# Patient Record
Sex: Male | Born: 1945 | Race: White | Hispanic: No | Marital: Single | Smoking: Former smoker
Health system: Southern US, Community
[De-identification: ages and names within clinical notes are randomized; demographics above are authoritative.]

## PROBLEM LIST (undated history)

## (undated) DIAGNOSIS — I10 Essential (primary) hypertension: Secondary | ICD-10-CM

## (undated) DIAGNOSIS — J8410 Pulmonary fibrosis, unspecified: Secondary | ICD-10-CM

## (undated) DIAGNOSIS — M069 Rheumatoid arthritis, unspecified: Secondary | ICD-10-CM

## (undated) DIAGNOSIS — I509 Heart failure, unspecified: Secondary | ICD-10-CM

## (undated) DIAGNOSIS — I4892 Unspecified atrial flutter: Secondary | ICD-10-CM

## (undated) DIAGNOSIS — J9610 Chronic respiratory failure, unspecified whether with hypoxia or hypercapnia: Secondary | ICD-10-CM

## (undated) HISTORY — DX: Rheumatoid arthritis, unspecified: M06.9

---

## 1966-10-19 HISTORY — PX: ACNE CYST REMOVAL: SUR1112

## 1971-10-20 HISTORY — PX: VASECTOMY: SHX75

## 1999-06-04 ENCOUNTER — Encounter (INDEPENDENT_AMBULATORY_CARE_PROVIDER_SITE_OTHER): Payer: Self-pay | Admitting: Specialist

## 1999-06-04 ENCOUNTER — Ambulatory Visit (HOSPITAL_COMMUNITY): Admission: RE | Admit: 1999-06-04 | Discharge: 1999-06-04 | Payer: Self-pay | Admitting: Gastroenterology

## 2010-06-04 ENCOUNTER — Encounter: Admission: RE | Admit: 2010-06-04 | Discharge: 2010-06-04 | Payer: Self-pay | Admitting: Family Medicine

## 2013-09-18 ENCOUNTER — Ambulatory Visit
Admission: RE | Admit: 2013-09-18 | Discharge: 2013-09-18 | Disposition: A | Discharge status: Home / Self Care | Payer: Medicare Other | Source: Ambulatory Visit | Attending: Family Medicine | Admitting: Family Medicine

## 2013-09-18 ENCOUNTER — Other Ambulatory Visit: Payer: Self-pay | Admitting: Family Medicine

## 2013-09-18 DIAGNOSIS — R0602 Shortness of breath: Secondary | ICD-10-CM

## 2013-09-22 ENCOUNTER — Other Ambulatory Visit (HOSPITAL_COMMUNITY): Payer: Self-pay

## 2013-09-22 DIAGNOSIS — R0602 Shortness of breath: Secondary | ICD-10-CM

## 2013-09-22 DIAGNOSIS — R05 Cough: Secondary | ICD-10-CM

## 2013-09-22 LAB — PULMONARY FUNCTION TEST
DL/VA % pred: 63 %
DLCO cor % pred: 38 %
DLCO unc % pred: 38 %
DLCO unc: 9.9 ml/min/mmHg
FEF 25-75 Post: 3.06 L/sec
FEF2575-%Pred-Post: 148 %
FEF2575-%Pred-Pre: 159 %
FEV1-%Pred-Pre: 68 %
FEV1-Post: 1.83 L
FEV1-Pre: 1.83 L
FEV1FVC-%Change-Post: 9 %
FEV6-%Change-Post: -9 %
FEV6-%Pred-Post: 53 %
FEV6-%Pred-Pre: 59 %
FEV6FVC-%Pred-Pre: 106 %
FVC-%Pred-Pre: 55 %
FVC-Post: 1.85 L
FVC-Pre: 2.03 L
Post FEV1/FVC ratio: 99 %
RV: 1.44 L
TLC: 3.61 L

## 2013-09-27 ENCOUNTER — Ambulatory Visit (HOSPITAL_COMMUNITY)
Admission: RE | Admit: 2013-09-27 | Discharge: 2013-09-27 | Disposition: A | Discharge status: Home / Self Care | Payer: Medicare Other | Source: Ambulatory Visit | Attending: Family Medicine | Admitting: Family Medicine

## 2013-09-27 DIAGNOSIS — R059 Cough, unspecified: Secondary | ICD-10-CM | POA: Insufficient documentation

## 2013-09-27 DIAGNOSIS — R05 Cough: Secondary | ICD-10-CM | POA: Insufficient documentation

## 2013-09-27 DIAGNOSIS — R0602 Shortness of breath: Secondary | ICD-10-CM | POA: Insufficient documentation

## 2013-09-27 MED ORDER — ALBUTEROL SULFATE (5 MG/ML) 0.5% IN NEBU
2.5000 mg | INHALATION_SOLUTION | Freq: Once | RESPIRATORY_TRACT | Status: AC
  Administered 2013-09-27: 2.5 mg via RESPIRATORY_TRACT

## 2013-10-31 ENCOUNTER — Encounter: Payer: Self-pay | Admitting: Internal Medicine

## 2013-11-01 ENCOUNTER — Institutional Professional Consult (permissible substitution): Payer: Medicare Other | Admitting: Internal Medicine

## 2013-11-21 ENCOUNTER — Telehealth: Payer: Self-pay | Admitting: Internal Medicine

## 2013-11-21 NOTE — Telephone Encounter (Signed)
Spoke with spouse. appt scheduled for wed w/ MW. Nothing further needed

## 2013-11-23 ENCOUNTER — Ambulatory Visit (INDEPENDENT_AMBULATORY_CARE_PROVIDER_SITE_OTHER): Payer: Medicare Other | Admitting: Internal Medicine

## 2013-11-23 ENCOUNTER — Encounter (INDEPENDENT_AMBULATORY_CARE_PROVIDER_SITE_OTHER): Payer: Self-pay

## 2013-11-23 ENCOUNTER — Ambulatory Visit (INDEPENDENT_AMBULATORY_CARE_PROVIDER_SITE_OTHER)
Admission: RE | Admit: 2013-11-23 | Discharge: 2013-11-23 | Disposition: A | Discharge status: Home / Self Care | Payer: Medicare Other | Source: Ambulatory Visit | Attending: Internal Medicine | Admitting: Internal Medicine

## 2013-11-23 ENCOUNTER — Encounter: Payer: Self-pay | Admitting: Internal Medicine

## 2013-11-23 ENCOUNTER — Other Ambulatory Visit (INDEPENDENT_AMBULATORY_CARE_PROVIDER_SITE_OTHER): Payer: Medicare Other

## 2013-11-23 VITALS — BP 140/80 | HR 88 | Temp 97.6°F | Ht 65.0 in | Wt 158.2 lb

## 2013-11-23 DIAGNOSIS — R0609 Other forms of dyspnea: Secondary | ICD-10-CM

## 2013-11-23 DIAGNOSIS — J8410 Pulmonary fibrosis, unspecified: Secondary | ICD-10-CM

## 2013-11-23 DIAGNOSIS — J9610 Chronic respiratory failure, unspecified whether with hypoxia or hypercapnia: Secondary | ICD-10-CM

## 2013-11-23 DIAGNOSIS — J9611 Chronic respiratory failure with hypoxia: Secondary | ICD-10-CM | POA: Insufficient documentation

## 2013-11-23 DIAGNOSIS — R0600 Dyspnea, unspecified: Secondary | ICD-10-CM | POA: Insufficient documentation

## 2013-11-23 DIAGNOSIS — R0989 Other specified symptoms and signs involving the circulatory and respiratory systems: Secondary | ICD-10-CM

## 2013-11-23 LAB — CBC WITH DIFFERENTIAL/PLATELET
BASOS PCT: 0.8 % (ref 0.0–3.0)
Basophils Absolute: 0.1 10*3/uL (ref 0.0–0.1)
EOS PCT: 2.6 % (ref 0.0–5.0)
Eosinophils Absolute: 0.4 10*3/uL (ref 0.0–0.7)
HEMATOCRIT: 43.2 % (ref 39.0–52.0)
Hemoglobin: 14.1 g/dL (ref 13.0–17.0)
LYMPHS ABS: 1.3 10*3/uL (ref 0.7–4.0)
LYMPHS PCT: 9.3 % — AB (ref 12.0–46.0)
MCHC: 32.8 g/dL (ref 30.0–36.0)
MCV: 87.2 fl (ref 78.0–100.0)
MONOS PCT: 6 % (ref 3.0–12.0)
Monocytes Absolute: 0.8 10*3/uL (ref 0.1–1.0)
NEUTROS ABS: 11.4 10*3/uL — AB (ref 1.4–7.7)
NEUTROS PCT: 81.3 % — AB (ref 43.0–77.0)
Platelets: 522 10*3/uL — ABNORMAL HIGH (ref 150.0–400.0)
RBC: 4.95 Mil/uL (ref 4.22–5.81)
RDW: 14.4 % (ref 11.5–14.6)
WBC: 14 10*3/uL — AB (ref 4.5–10.5)

## 2013-11-23 LAB — BASIC METABOLIC PANEL
BUN: 15 mg/dL (ref 6–23)
CHLORIDE: 104 meq/L (ref 96–112)
CO2: 23 meq/L (ref 19–32)
CREATININE: 1.1 mg/dL (ref 0.4–1.5)
Calcium: 10.5 mg/dL (ref 8.4–10.5)
GFR: 67.61 mL/min (ref >60.00)
Glucose, Bld: 109 mg/dL — ABNORMAL HIGH (ref 70–99)
Potassium: 4.2 mEq/L (ref 3.5–5.1)
Sodium: 136 mEq/L (ref 135–145)

## 2013-11-23 LAB — BRAIN NATRIURETIC PEPTIDE: PRO B NATRI PEPTIDE: 220 pg/mL — AB (ref 0.0–100.0)

## 2013-11-23 LAB — SEDIMENTATION RATE: SED RATE: 95 mm/h — AB (ref 0–22)

## 2013-11-23 MED ORDER — PREDNISONE 10 MG PO TABS: ORAL_TABLET | ORAL | Status: DC

## 2013-11-23 MED ORDER — FAMOTIDINE 20 MG PO TABS: ORAL_TABLET | ORAL | Status: DC

## 2013-11-23 MED ORDER — PANTOPRAZOLE SODIUM 40 MG PO TBEC: 40.0000 mg | DELAYED_RELEASE_TABLET | Freq: Every day | ORAL | Status: DC

## 2013-11-23 NOTE — Assessment & Plan Note (Signed)
-  pfts 09/27/14  VC 2.17 ( 59%) no obst with dlco 38 corrects to 63 - RA sats 62% 11/23/2013 at rest > started 24h 02 (see chronic respiratory failure)  - 11/23/2013 ESR 95  DDx for pulmonary fibrosis  includes idiopathic pulmonary fibrosis, pulmonary fibrosis associated with rheumatologic diseases (which have a relatively benign course in most cases) , adverse effect from  drugs such as chemotherapy or amiodarone exposure, nonspecific interstitial pneumonia which is typically steroid responsive, and chronic hypersensitivity pneumonitis.   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  Dec 2011  This may not be cause and effect, but given how universally unhelpful all the otherstudy drugs have been for pf,   rec start  rx ppi / diet/ lifestyle modification.    .Discussed in detail all the  indications, usual  risks and alternatives  relative to the benefits with patient who agrees to proceed with trial of steroids mod dose x 2 weeks

## 2013-11-23 NOTE — Progress Notes (Signed)
   Subjective:    Patient ID: Gilbert Dominguez, male    DOB: 03-29-1946   MRN: [id number]  HPI  55 yowm quit smoking 1996 with dx of RA around 2010 followed by [facility] referred 11/23/2013 to pulmonary clinic for ? ILD   11/23/2013 1st [facility] office visit/ Gilbert Dominguez cc new doe x Jan 2014 and stopped all RA meds(mtx) in august of 2014 - onset was insidious and gradual to point where walk 50 ft esp last month assoc with dry cough but good control of arthritis and mild chronic nasal congestion s purulent secretions or sinus pain or epistaxis   No obvious day to day or daytime variabilty or assoc chronic cough or cp or chest tightness, subjective wheeze overt sinus or hb symptoms. No unusual exp hx or h/o childhood pna/ asthma or knowledge of premature birth.  Sleeping ok without nocturnal  or early am exacerbation  of respiratory  c/o's or need for noct saba. Also denies any obvious fluctuation of symptoms with weather or environmental changes or other aggravating or alleviating factors except as outlined above   Current Medications, Allergies, Complete Past Medical History, Past Surgical History, Family History, and Social History were reviewed in [employer] record.   y.         Review of Systems  Constitutional: Positive for appetite change and unexpected weight change. Negative for fever, chills and activity change.  HENT: Positive for congestion. Negative for dental problem, postnasal drip, rhinorrhea, sneezing, sore throat, trouble swallowing and voice change.   Eyes: Negative for visual disturbance.  Respiratory: Positive for cough and shortness of breath. Negative for choking.   Cardiovascular: Negative for chest pain and leg swelling.  Gastrointestinal: Negative for nausea, vomiting and abdominal pain.  Genitourinary: Negative for difficulty urinating.  Musculoskeletal: Negative for arthralgias.  Skin: Negative for rash.  Psychiatric/Behavioral: Negative  for behavioral problems and confusion.       Objective:   Physical Exam amb wm nad  Wt Readings from Last 3 Encounters:  11/23/13 158 lb 3.2 oz (71.759 kg)     HEENT: nl dentition, turbinates, and orophanx. Nl external ear canals without cough reflex   NECK :  without JVD/Nodes/TM/ nl carotid upstrokes bilaterally   LUNGS: dry insp crackles worse in bases    CV:  RRR  no s3 or murmur or increase in P2, no edema   ABD:  soft and nontender with nl excursion in the supine position. No bruits or organomegaly, bowel sounds nl  MS:  warm without deformities, calf tenderness, cyanosis- mod clubbng   SKIN: warm and dry without lesions    NEURO:  alert, approp, no deficits    CXR  11/23/2013 :  1. Interstitial pulmonary edema versus interstitial pneumonitis, superimposed upon baseline changes of interstitial pulmonary fibrosis. 2. Scarring in the right middle lobe and right lower lobe. 3. Stable mild cardiomegaly.      Assessment & Plan:

## 2013-11-23 NOTE — Patient Instructions (Addendum)
Please see patient coordinator before you leave today  to schedule 02 24/7  2lpm at rest and 4lpm with activity   Please remember to go to the lab and x-ray department downstairs for your tests - we will call you with the results when they are available.  Prednisone 20 mg twice daily with meals   Pantoprazole (protonix) 40 mg   Take 30-60 min before first meal of the day and Pepcid 20 mg one bedtime until return to office - this is the best way to tell whether stomach acid is contributing to your problem.    GERD (REFLUX)  is an extremely common cause of respiratory symptoms, many times with no significant heartburn at all.    It can be treated with medication, but also with lifestyle changes including avoidance of late meals, excessive alcohol, smoking cessation, and avoid fatty foods, chocolate, peppermint, colas, red wine, and acidic juices such as orange juice.  NO MINT OR MENTHOL PRODUCTS SO NO COUGH DROPS  USE SUGARLESS CANDY INSTEAD (jolley ranchers or Stover's)  NO OIL BASED VITAMINS - use powdered substitutes.    Please schedule a follow up office visit in 2 weeks, sooner if needed

## 2013-11-23 NOTE — Assessment & Plan Note (Addendum)
bnp 220 so not likely this is chf though haven't excluded diastolic chf

## 2013-11-23 NOTE — Assessment & Plan Note (Signed)
-  RA sats 62% 11/23/2013  - 11/23/2013   Walked 4lpm  x one lap @ 185 stopped due to desat to 73%   rec 2lpm at rest, 4lpm with activity  Hopefully will respond to steroids over next several weeks, re-eval 02 needs then.

## 2013-11-28 ENCOUNTER — Encounter: Payer: Self-pay | Admitting: Internal Medicine

## 2013-12-01 ENCOUNTER — Institutional Professional Consult (permissible substitution): Payer: Medicare Other | Admitting: Internal Medicine

## 2013-12-08 ENCOUNTER — Encounter: Payer: Self-pay | Admitting: Internal Medicine

## 2013-12-08 ENCOUNTER — Ambulatory Visit (INDEPENDENT_AMBULATORY_CARE_PROVIDER_SITE_OTHER)
Admission: RE | Admit: 2013-12-08 | Discharge: 2013-12-08 | Disposition: A | Discharge status: Home / Self Care | Payer: Medicare Other | Source: Ambulatory Visit | Attending: Internal Medicine | Admitting: Internal Medicine

## 2013-12-08 ENCOUNTER — Other Ambulatory Visit (INDEPENDENT_AMBULATORY_CARE_PROVIDER_SITE_OTHER): Payer: Medicare Other

## 2013-12-08 ENCOUNTER — Ambulatory Visit (INDEPENDENT_AMBULATORY_CARE_PROVIDER_SITE_OTHER): Payer: Medicare Other | Admitting: Internal Medicine

## 2013-12-08 VITALS — BP 160/90 | HR 50 | Temp 98.0°F | Ht 65.5 in | Wt 154.0 lb

## 2013-12-08 DIAGNOSIS — J8410 Pulmonary fibrosis, unspecified: Secondary | ICD-10-CM

## 2013-12-08 DIAGNOSIS — J9610 Chronic respiratory failure, unspecified whether with hypoxia or hypercapnia: Secondary | ICD-10-CM

## 2013-12-08 LAB — SEDIMENTATION RATE: Sed Rate: 14 mm/hr (ref 0–22)

## 2013-12-08 NOTE — Progress Notes (Signed)
Subjective:    Patient ID: Gilbert Dominguez, male    DOB: July 07, 1946   MRN: [id number]   Brief patient profile:  82 yowm quit smoking 1996 with dx of RA around 2010 followed by [facility] referred 11/23/2013 to pulmonary clinic for ? ILD   History of Present Illness  11/23/2013 1st [facility] office visit/ Maryn Freelove cc new doe x Jan 2014 and stopped all RA meds(mtx) in august of 2014 - onset was insidious and gradual to point where walk 50 ft esp last month assoc with dry cough but ok  control of arthritis ( vs historical control) and mild chronic nasal congestion s purulent secretions or sinus pain or epistaxis  rec Please see patient coordinator before you leave today  to schedule 02 24/7  2lpm at rest and 4lpm with activity  Prednisone 20 mg twice daily with meals  Pantoprazole (protonix) 40 mg   Take 30-60 min before first meal of the day and Pepcid 20 mg one bedtime until return to office - this is the best way to tell whether stomach acid is contributing to your problem.   GERD diet reviewed   12/08/2013 f/u ov/Yonna Alwin re: PF/ on pred 20 mg bid Chief Complaint  Patient presents with  . Follow-up    Breathing is much improved with o2. He c/o rhinitis.   arthritis better, cough better, not sure about doe as not doing much activity  No obvious day to day or daytime variabilty or assoc   cp or chest tightness, subjective wheeze overt sinus or hb symptoms. No unusual exp hx or h/o childhood pna/ asthma or knowledge of premature birth.  Sleeping ok without nocturnal  or early am exacerbation  of respiratory  c/o's or need for noct saba. Also denies any obvious fluctuation of symptoms with weather or environmental changes or other aggravating or alleviating factors except as outlined above   Current Medications, Allergies, Complete Past Medical History, Past Surgical History, Family History, and Social History were reviewed in [employer] record.  ROS  The following are  not active complaints unless bolded sore throat, dysphagia, dental problems, itching, sneezing,  nasal congestion or excess/ purulent secretions, ear ache,   fever, chills, sweats, unintended wt loss, pleuritic or exertional cp, hemoptysis,  orthopnea pnd or leg swelling, presyncope, palpitations, heartburn, abdominal pain, anorexia, nausea, vomiting, diarrhea  or change in bowel or urinary habits, change in stools or urine, dysuria,hematuria,  rash, arthralgias, visual complaints, headache, numbness weakness or ataxia or problems with walking or coordination,  change in mood/affect or memory.                    Objective:   Physical Exam amb wm nad  Wt Readings from Last 3 Encounters:  12/08/13 154 lb (69.854 kg)  11/23/13 158 lb 3.2 oz (71.759 kg)        HEENT: nl dentition, turbinates, and orophanx. Nl external ear canals without cough reflex   NECK :  without JVD/Nodes/TM/ nl carotid upstrokes bilaterally   LUNGS: min dry insp crackles worse in bases    CV:  RRR  no s3 or murmur or increase in P2, no edema   ABD:  soft and nontender with nl excursion in the supine position. No bruits or organomegaly, bowel sounds nl  MS:  warm without deformities, calf tenderness, cyanosis- mild to mod clubbng   SKIN: warm and dry without lesions    NEURO:  alert, approp, no deficits  CXR   12/08/13 Pulmonary fibrosis with overall improved aeration.        Assessment & Plan:

## 2013-12-08 NOTE — Progress Notes (Signed)
Quick Note:  Spoke with pt and notified of results per Dr. Wert. Pt verbalized understanding and denied any questions.  ______ 

## 2013-12-08 NOTE — Patient Instructions (Addendum)
02 :  2lpm at bedtime and 4lpm with walking outside the house and exercise of any kind, otherwise don't need 02 at rest and very short walks (room to room)  Please remember to go to the lab and x-ray department downstairs for your tests - we will call you with the results when they are available and tell you how to adjust your prednisone  Please schedule a follow up visit in 3 months but call sooner if needed   .

## 2013-12-09 NOTE — Assessment & Plan Note (Signed)
-  RA sats 62% 11/23/2013  - 11/23/2013   Walked 4lpm  x one lap @ 185 stopped due to desat to 73%  - 12/08/2013  Rest 94% RA,  Walked 4lpm x 3 laps @ 185 ft each stopped due to  End of study, sats still 94%     There has been very marked improvement since rx of prednisone so new rec no need for 02 at rest or room to room, 2lpm at hs and 4lpm with exertion

## 2013-12-09 NOTE — Assessment & Plan Note (Signed)
-  pfts 09/27/14  VC 2.17 ( 59%) no obst with dlco 38 corrects to 63 - RA sats 62% 11/23/2013 at rest > started 24h 02 (see chronic respiratory failure)  - 11/23/2013 ESR 95 rx pred 20 mg bid  >  12/08/2013 = 14  The goal with a chronic steroid dependent illness is always arriving at the lowest effective dose that controls the disease/symptoms and not accepting a set "formula" which is based on statistics or guidelines that don't always take into account patient  variability or the natural hx of the dz in every individual patient, which may well vary over time.  For now therefore I recommend the patient maintain  A reduced dose of 10 mg bid  

## 2014-01-01 ENCOUNTER — Other Ambulatory Visit: Payer: Self-pay | Admitting: Internal Medicine

## 2014-02-12 ENCOUNTER — Other Ambulatory Visit: Payer: Self-pay | Admitting: Internal Medicine

## 2014-03-08 ENCOUNTER — Encounter: Payer: Self-pay | Admitting: Internal Medicine

## 2014-03-08 ENCOUNTER — Ambulatory Visit (INDEPENDENT_AMBULATORY_CARE_PROVIDER_SITE_OTHER): Payer: Medicare Other | Admitting: Internal Medicine

## 2014-03-08 VITALS — BP 144/86 | HR 59 | Ht 65.5 in | Wt 159.0 lb

## 2014-03-08 DIAGNOSIS — J9610 Chronic respiratory failure, unspecified whether with hypoxia or hypercapnia: Secondary | ICD-10-CM

## 2014-03-08 DIAGNOSIS — J8410 Pulmonary fibrosis, unspecified: Secondary | ICD-10-CM

## 2014-03-08 NOTE — Progress Notes (Signed)
Subjective:    Patient ID: Gilbert Dominguez, male    DOB: Mar 13, 1946   MRN: [id number]   Brief patient profile:  36 yowm quit smoking 1996 with dx of RA around 2010 followed by [facility] referred 11/23/2013 to pulmonary clinic for ? ILD   History of Present Illness  11/23/2013 1st [facility] office visit/ Tanai Bouler cc new doe x Jan 2014 and stopped all RA meds(mtx) in august of 2014 - onset was insidious and gradual to point where walk 50 ft esp last month assoc with dry cough but ok  control of arthritis ( vs historical control) and mild chronic nasal congestion s purulent secretions or sinus pain or epistaxis  rec Please see patient coordinator before you leave today  to schedule 02 24/7  2lpm at rest and 4lpm with activity  Prednisone 20 mg twice daily with meals  Pantoprazole (protonix) 40 mg   Take 30-60 min before first meal of the day and Pepcid 20 mg one bedtime until return to office - this is the best way to tell whether stomach acid is contributing to your problem.   GERD diet reviewed   12/08/2013 f/u ov/Marlaysia Lenig re: PF/ on pred 20 mg bid Chief Complaint  Patient presents with  . Follow-up    Breathing is much improved with o2. He c/o rhinitis.   arthritis better, cough better, not sure about doe as not doing much activity rec 02 :  2lpm at bedtime and 4lpm with walking outside the house and exercise of any kind, otherwise don't need 02 at rest and very short walks (room to room)      03/08/2014 f/u ov/Shawnita Krizek re: ILD / 02 dep at hs and ex not using 02 as rec  Chief Complaint  Patient presents with  . Follow-up    c/o nonprod cough after cold food/drinks.  No other complaints at this time.   can walk medium pace flat s 02 for 15-20 min and sats are in mid 80s  arthritis and breathing much better since rx with prednisone being tapered down by Dr Charlestine Night to 20 mg per day    No obvious day to day or daytime variabilty or assoc  cp or chest tightness, subjective wheeze overt sinus  or hb symptoms. No unusual exp hx or h/o childhood pna/ asthma or knowledge of premature birth.  Sleeping ok without nocturnal  or early am exacerbation  of respiratory  c/o's or need for noct saba. Also denies any obvious fluctuation of symptoms with weather or environmental changes or other aggravating or alleviating factors except as outlined above   Current Medications, Allergies, Complete Past Medical History, Past Surgical History, Family History, and Social History were reviewed in [employer] record.  ROS  The following are not active complaints unless bolded sore throat, dysphagia, dental problems, itching, sneezing,  nasal congestion or excess/ purulent secretions, ear ache,   fever, chills, sweats, unintended wt loss, pleuritic or exertional cp, hemoptysis,  orthopnea pnd or leg swelling, presyncope, palpitations, heartburn, abdominal pain, anorexia, nausea, vomiting, diarrhea  or change in bowel or urinary habits, change in stools or urine, dysuria,hematuria,  rash, arthralgias, visual complaints, headache, numbness weakness or ataxia or problems with walking or coordination,  change in mood/affect or memory.                    Objective:   Physical Exam  amb wm nad/ min cushingnoid Wt Readings from Last 3 Encounters:  03/08/14 159  lb (72.122 kg)  12/08/13 154 lb (69.854 kg)  11/23/13 158 lb 3.2 oz (71.759 kg)           HEENT: nl dentition, turbinates, and orophanx. Nl external ear canals without cough reflex   NECK :  without JVD/Nodes/TM/ nl carotid upstrokes bilaterally   LUNGS: min dry insp crackles only  in bases    CV:  RRR  no s3 or murmur or increase in P2, no edema   ABD:  soft and nontender with nl excursion in the supine position. No bruits or organomegaly, bowel sounds nl  MS:  warm without deformities, calf tenderness, cyanosis- mild to mod clubbng   SKIN: warm and dry without lesions    NEURO:  alert, approp, no deficits      CXR   12/08/13 Pulmonary fibrosis with overall improved aeration.        Assessment & Plan:

## 2014-03-08 NOTE — Assessment & Plan Note (Signed)
-  pfts 09/27/14  VC 2.17 ( 59%) no obst with dlco 38 corrects to 63 - RA sats 62% 11/23/2013 at rest > started 24h 02 (see chronic respiratory failure)  - 11/23/2013 ESR 95 rx pred 20 mg bid  >  12/08/2013 = 14 so decreased to 10 mg bid   The goal with a clearly  steroid responsive illness is always arriving at the lowest effective dose that controls the disease/symptoms and not accepting a set "formula" which is based on statistics or guidelines that don't always take into account patient  variability or the natural hx of the dz in every individual patient, which may well vary over time.  For now therefore I recommend the patient maintain rx per Dr Charlestine Night   rec f/u PFT's 09/2014

## 2014-03-08 NOTE — Assessment & Plan Note (Signed)
-  RA sats 62% 11/23/2013  - 11/23/2013   Walked 4lpm  x one lap @ 185 stopped due to desat to 73%  - 12/08/2013  Rest 94% RA,  Walked 4lpm x 3 laps @ 185 ft each stopped due to  End of study, sats still 94%  - 03/08/2014  Walked RA  2 laps @ 185 ft each stopped due to sats 86 corrected on 2lpm     rec no need for 02 at rest or room to room,  New rx = 2lpm at hs and   2lpm with walking outside more than 200 ft

## 2014-03-08 NOTE — Patient Instructions (Addendum)
No need for 02 at rest or room to room walking Please use 2lpm at hs and   2lpm with walking outside more than 200 ft  Please see patient coordinator before you leave today  to schedule a portable system for your daily walk   Please schedule a follow up visit in 6 months but call sooner if needed with pfts on return

## 2014-05-21 ENCOUNTER — Other Ambulatory Visit: Payer: Self-pay | Admitting: Internal Medicine

## 2014-09-04 ENCOUNTER — Ambulatory Visit (INDEPENDENT_AMBULATORY_CARE_PROVIDER_SITE_OTHER): Payer: Medicare Other | Admitting: Internal Medicine

## 2014-09-04 ENCOUNTER — Ambulatory Visit (INDEPENDENT_AMBULATORY_CARE_PROVIDER_SITE_OTHER)
Admission: RE | Admit: 2014-09-04 | Discharge: 2014-09-04 | Disposition: A | Discharge status: Home / Self Care | Payer: Medicare Other | Source: Ambulatory Visit | Attending: Internal Medicine | Admitting: Internal Medicine

## 2014-09-04 ENCOUNTER — Encounter: Payer: Self-pay | Admitting: Internal Medicine

## 2014-09-04 VITALS — BP 130/84 | HR 124 | Ht 65.0 in | Wt 160.0 lb

## 2014-09-04 DIAGNOSIS — R05 Cough: Secondary | ICD-10-CM

## 2014-09-04 DIAGNOSIS — J9610 Chronic respiratory failure, unspecified whether with hypoxia or hypercapnia: Secondary | ICD-10-CM

## 2014-09-04 DIAGNOSIS — R059 Cough, unspecified: Secondary | ICD-10-CM

## 2014-09-04 DIAGNOSIS — J8410 Pulmonary fibrosis, unspecified: Secondary | ICD-10-CM

## 2014-09-04 DIAGNOSIS — J9611 Chronic respiratory failure with hypoxia: Secondary | ICD-10-CM

## 2014-09-04 LAB — PULMONARY FUNCTION TEST
DL/VA % pred: 83 %
DL/VA: 3.56 ml/min/mmHg/L
DLCO unc % pred: 38 %
DLCO unc: 9.83 ml/min/mmHg
FEF 25-75 Post: 2.09 L/s
FEF 25-75 Pre: 2.38 L/s
FEF2575-%Change-Post: -12 %
FEF2575-%Pred-Post: 103 %
FEF2575-%Pred-Pre: 118 %
FEV1-%Change-Post: -2 %
FEV1-%Pred-Post: 75 %
FEV1-%Pred-Pre: 77 %
FEV1-Post: 1.98 L
FEV1-Pre: 2.04 L
FEV1FVC-%Change-Post: 1 %
FEV1FVC-%Pred-Pre: 112 %
FEV6-%Change-Post: -8 %
FEV6-%Pred-Post: 65 %
FEV6-%Pred-Pre: 72 %
FEV6-Post: 2.23 L
FEV6-Pre: 2.44 L
FEV6FVC-%Pred-Post: 106 %
FEV6FVC-%Pred-Pre: 106 %
FVC-%Change-Post: -4 %
FVC-%Pred-Post: 64 %
FVC-%Pred-Pre: 67 %
FVC-Post: 2.33 L
FVC-Pre: 2.44 L
Post FEV1/FVC ratio: 85 %
Post FEV6/FVC ratio: 100 %
Pre FEV1/FVC ratio: 84 %
Pre FEV6/FVC Ratio: 100 %
RV % pred: 56 %
RV: 1.24 L
TLC % pred: 58 %
TLC: 3.53 L

## 2014-09-04 NOTE — Patient Instructions (Addendum)
I recommend against taking fosfamax as long as you are coughing as it may contribute to the cough   For drainage >>take chlortrimeton (chlorpheniramine) 4 mg every 4 hours available over the counter (may cause drowsiness, available over the counter)  For cough>> Take delsym two tsp every 12 hours   to suppress the urge to cough. Swallowing water or using ice chips/non mint and non menthol containing candies (such as lifesavers or sugarless jolly ranchers) are also effective.  You should rest your voice and avoid activities that you know make you cough.   Please remember to go to the  x-ray department downstairs for your tests - we will call you with the results when they are available.  Please schedule a follow up visit in 3 months but call sooner if needed  Late add:  Repeat walking sats Ra next ov

## 2014-09-04 NOTE — Progress Notes (Signed)
Subjective:    Patient ID: Gilbert Dominguez, male    DOB: 1946/06/11   MRN: [id number]   Brief patient profile:  61  yowm quit smoking 1996 with dx of RA around 2010 followed by [facility] referred 11/23/2013 to pulmonary clinic for ? ILD with pfts w/w mod restriction  09/04/2014 unchanged from 09/2013   History of Present Illness  11/23/2013 1st [facility] office visit/ Gilbert Dominguez cc new doe x Jan 2014 and stopped all RA meds(mtx) in august of 2014 - onset was insidious and gradual to point where walk 50 ft esp last month assoc with dry cough but ok  control of arthritis ( vs historical control) and mild chronic nasal congestion s purulent secretions or sinus pain or epistaxis  rec Please see patient coordinator before you leave today  to schedule 02 24/7  2lpm at rest and 4lpm with activity  Prednisone 20 mg twice daily with meals  Pantoprazole (protonix) 40 mg   Take 30-60 min before first meal of the day and Pepcid 20 mg one bedtime until return to office - this is the best way to tell whether stomach acid is contributing to your problem.   GERD diet reviewed   12/08/2013 f/u ov/Gilbert Dominguez re: PF/ on pred 20 mg bid Chief Complaint  Patient presents with  . Follow-up    Breathing is much improved with o2. He c/o rhinitis.   arthritis better, cough better, not sure about doe as not doing much activity rec 02 :  2lpm at bedtime and 4lpm with walking outside the house and exercise of any kind, otherwise don't need 02 at rest and very short walks (room to room)      03/08/2014 f/u ov/Gilbert Dominguez re: ILD / 02 dep at hs and ex not using 02 as rec  Chief Complaint  Patient presents with  . Follow-up    c/o nonprod cough after cold food/drinks.  No other complaints at this time.   can walk medium pace flat s 02 for 15-20 min and sats are in mid 80s  arthritis and breathing much better since rx with prednisone being tapered down by Dr Charlestine Night to 20 mg per day  rec No need for 02 at rest or room to room  walking Please use 2lpm at hs and   2lpm with walking outside more than 200 ft  Please see patient coordinator before you leave today  to schedule a portable system for your daily walk   09/04/2014 f/u ov/Gilbert Dominguez re: ILD/ new cough  Chief Complaint  Patient presents with  . Follow-up    PFT done today. Breathing is unchanged. Pt c/o cough for the past 6 wks- "makes me have to clear throat".   just using 02 at hs / Not limited by breathing from desired activities   Cough is new problem x 6 weeks day > noct min white mucus  On fosfamax  Weaned off pred effective early August  Started acutely with ? [name] feels like persistent pnds     No obvious day to day or daytime variabilty or assoc  cp or chest tightness, subjective wheeze overt sinus or hb symptoms. No unusual exp hx or h/o childhood pna/ asthma or knowledge of premature birth.  Sleeping ok without nocturnal  or early am exacerbation  of respiratory  c/o's or need for noct saba. Also denies any obvious fluctuation of symptoms with weather or environmental changes or other aggravating or alleviating factors except as outlined above   Current  Medications, Allergies, Complete Past Medical History, Past Surgical History, Family History, and Social History were reviewed in [employer] record.  ROS  The following are not active complaints unless bolded sore throat, dysphagia, dental problems, itching, sneezing,  nasal congestion or excess/ purulent secretions, ear ache,   fever, chills, sweats, unintended wt loss, pleuritic or exertional cp, hemoptysis,  orthopnea pnd or leg swelling, presyncope, palpitations, heartburn, abdominal pain, anorexia, nausea, vomiting, diarrhea  or change in bowel or urinary habits, change in stools or urine, dysuria,hematuria,  rash, arthralgias, visual complaints, headache, numbness weakness or ataxia or problems with walking or coordination,  change in mood/affect or memory.                     Objective:   Physical Exam  amb wm nad/ min cushingnoid  09/04/2014      160  Wt Readings from Last 3 Encounters:  03/08/14 159 lb (72.122 kg)  12/08/13 154 lb (69.854 kg)  11/23/13 158 lb 3.2 oz (71.759 kg)           HEENT: nl dentition, turbinates, and orophanx. Nl external ear canals without cough reflex   NECK :  without JVD/Nodes/TM/ nl carotid upstrokes bilaterally   LUNGS: min dry insp crackles only  in bases    CV:  RRR  no s3 or murmur or increase in P2, no edema   ABD:  soft and nontender with nl excursion in the supine position. 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 Enlargement of cardiac silhouette       Assessment & Plan:

## 2014-09-04 NOTE — Progress Notes (Signed)
PFT done today.

## 2014-09-05 DIAGNOSIS — R05 Cough: Secondary | ICD-10-CM | POA: Insufficient documentation

## 2014-09-05 DIAGNOSIS — R059 Cough, unspecified: Secondary | ICD-10-CM | POA: Insufficient documentation

## 2014-09-05 NOTE — Assessment & Plan Note (Signed)
-  RA sats 62% 11/23/2013  - 11/23/2013   Walked 4lpm  x one lap @ 185 stopped due to desat to 73%  - 12/08/2013  Rest 94% RA,  Walked 4lpm x 3 laps @ 185 ft each stopped due to  End of study, sats still 94%  - 03/08/2014  Walked RA  2 laps @ 185 ft each stopped due to sats 86 corrected on 2lpm    As of 03/08/2014  rec no need for 02 at rest or room to room, 2lpm at hs and   2lpm with walking outside more than 200 ft   Reports only using 02 at hs at present > no change recs

## 2014-09-05 NOTE — Assessment & Plan Note (Signed)
Most likely this is    Upper airway cough syndrome, so named because it's frequently impossible to sort out how much is  CR/sinusitis with freq throat clearing (which can be related to primary GERD)   vs  causing  secondary (" extra esophageal")  GERD from wide swings in gastric pressure that occur with throat clearing, often  promoting self use of mint and menthol lozenges that reduce the lower esophageal sphincter tone and exacerbate the problem further in a cyclical fashion.   These are the same pts (now being labeled as having "irritable larynx syndrome" by some cough centers) who not infrequently have a history of having failed to tolerate ace inhibitors,  dry powder inhalers or biphosphonates(which may be the case here) or report having atypical reflux symptoms that don't respond to standard doses of PPI , and are easily confused as having aecopd or asthma flares by even experienced allergists/ pulmonologists.   rec hold fosfamax while coughing and max rx for gerd/ pnds with h1 and h2 at hs and h1 prn daytime and regroup in 2 weeks if not better, o/w we can see him every 3 months and re -eval resp rx then

## 2014-09-05 NOTE — Assessment & Plan Note (Signed)
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

## 2014-09-28 ENCOUNTER — Encounter: Payer: Self-pay | Admitting: Pediatrics

## 2014-11-12 ENCOUNTER — Other Ambulatory Visit: Payer: Self-pay | Admitting: Internal Medicine

## 2014-12-03 ENCOUNTER — Ambulatory Visit: Payer: Medicare Other | Admitting: Internal Medicine

## 2014-12-08 ENCOUNTER — Other Ambulatory Visit: Payer: Self-pay | Admitting: Internal Medicine

## 2014-12-12 ENCOUNTER — Encounter: Payer: Self-pay | Admitting: Internal Medicine

## 2014-12-12 ENCOUNTER — Ambulatory Visit (INDEPENDENT_AMBULATORY_CARE_PROVIDER_SITE_OTHER): Payer: Medicare Other | Admitting: Internal Medicine

## 2014-12-12 VITALS — BP 160/100 | HR 82 | Ht 65.0 in | Wt 162.0 lb

## 2014-12-12 DIAGNOSIS — J8410 Pulmonary fibrosis, unspecified: Secondary | ICD-10-CM

## 2014-12-12 DIAGNOSIS — J9611 Chronic respiratory failure with hypoxia: Secondary | ICD-10-CM

## 2014-12-12 NOTE — Patient Instructions (Addendum)
You will need to learn to walk at a slow pace or wear 02 when doing more than 200 ft at a normal pace  Return mid June 2016 for pfts

## 2014-12-12 NOTE — Progress Notes (Signed)
Subjective:    Patient ID: Gilbert Dominguez, male    DOB: Jan 18, 1946   MRN: [id number]   Brief patient profile:  73  yowm quit smoking 1996 with dx of RA around 2010 followed by [facility] referred 11/23/2013 to pulmonary clinic for ? ILD with pfts w/w mod restriction  09/04/2014 unchanged from 09/2013   History of Present Illness  11/23/2013 1st [facility] office visit/ Wert cc new doe x Jan 2014 and stopped all RA meds(mtx) in august of 2014 - onset was insidious and gradual to point where walk 50 ft esp last month assoc with dry cough but ok  control of arthritis ( vs historical control) and mild chronic nasal congestion s purulent secretions or sinus pain or epistaxis  rec Please see patient coordinator before you leave today  to schedule 02 24/7  2lpm at rest and 4lpm with activity  Prednisone 20 mg twice daily with meals  Pantoprazole (protonix) 40 mg   Take 30-60 min before first meal of the day and Pepcid 20 mg one bedtime until return to office - this is the best way to tell whether stomach acid is contributing to your problem.   GERD diet reviewed   03/08/2014 f/u ov/Wert re: ILD / 02 dep at hs and ex not using 02 as rec  Chief Complaint  Patient presents with  . Follow-up    c/o nonprod cough after cold food/drinks.  No other complaints at this time.   can walk medium pace flat s 02 for 15-20 min and sats are in mid 80s  arthritis and breathing much better since rx with prednisone being tapered down by Dr Charlestine Night to 20 mg per day  rec No need for 02 at rest or room to room walking Please use 2lpm at hs and   2lpm with walking outside more than 200 ft  Please see patient coordinator before you leave today  to schedule a portable system for your daily walk   09/04/2014 f/u ov/Wert re: ILD/ new cough  Chief Complaint  Patient presents with  . Follow-up    PFT done today. Breathing is unchanged. Pt c/o cough for the past 6 wks- "makes me have to clear throat".   just using  02 at hs / Not limited by breathing from desired activities   Cough is new problem x 6 weeks day > noct min white mucus  On fosfamax  Weaned off pred effective early August  Started acutely with ? [name] feels like persistent pnds  rec I recommend against taking fosfamax as long as you are coughing as it may contribute to the cough  For drainage >>take chlortrimeton (chlorpheniramine) 4 mg every 4 hours available over the counter (may cause drowsiness, available over the counter) For cough>> Take delsym two tsp every 12 hours   to suppress the urge to cough. Swallowing water or using ice chips/non mint and non menthol containing candies (such as lifesavers or sugarless jolly ranchers) are also effective.  You should rest your voice and avoid activities that you know make you cough. Please remember to go to the  x-ray department downstairs for your tests - we will call you with the results when they are available. Please schedule a follow up visit in 3 months but call sooner if needed  Late add:  Repeat walking sats Ra next ov    12/12/2014 f/u ov/Wert re: PF/ RA Chief Complaint  Patient presents with  . Follow-up    Pt states  that his breathing is overall doing well. He is only using o2 with sleep @ 2lpm. He states not clearing his throat as often.    no longer on fosfamax  On chlorpheniramine>>  throat clearing and drainage better    Not limited by breathing from desired activities    No obvious day to day or daytime variabilty ex tol or assoc  cp or chest tightness, subjective wheeze overt sinus or hb symptoms. No unusual exp hx or h/o childhood pna/ asthma or knowledge of premature birth.  Sleeping ok without nocturnal  or early am exacerbation  of respiratory  c/o's or need for noct saba. Also denies any obvious fluctuation of symptoms with weather or environmental changes or other aggravating or alleviating factors except as outlined above   Current Medications, Allergies, Complete Past  Medical History, Past Surgical History, Family History, and Social History were reviewed in [employer] record.  ROS  The following are not active complaints unless bolded sore throat, dysphagia, dental problems, itching, sneezing,  nasal congestion or excess/ purulent secretions, ear ache,   fever, chills, sweats, unintended wt loss, pleuritic or exertional cp, hemoptysis,  orthopnea pnd or leg swelling, presyncope, palpitations, heartburn, abdominal pain, anorexia, nausea, vomiting, diarrhea  or change in bowel or urinary habits, change in stools or urine, dysuria,hematuria,  rash, arthralgias, visual complaints, headache, numbness weakness or ataxia or problems with walking or coordination,  change in mood/affect or memory.                    Objective:   Physical Exam  amb wm nad/ min cushingnoid  09/04/2014      160 > 12/12/2014   162  Wt Readings from Last 3 Encounters:  03/08/14 159 lb (72.122 kg)  12/08/13 154 lb (69.854 kg)  11/23/13 158 lb 3.2 oz (71.759 kg)           HEENT: nl dentition, turbinates, and orophanx. Nl external ear canals without cough reflex   NECK :  without JVD/Nodes/TM/ nl carotid upstrokes bilaterally   LUNGS: min dry insp crackles only  in bases    CV:  RRR  no s3 or murmur or increase in P2, no edema   ABD:  soft and nontender with nl excursion in the supine position. 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 Enlargement of cardiac silhouette       Assessment & Plan:

## 2014-12-13 ENCOUNTER — Encounter: Payer: Self-pay | Admitting: Internal Medicine

## 2014-12-13 NOTE — Assessment & Plan Note (Addendum)
-  pfts 09/27/14        VC 2.17 (59%) no obst with dlco 38 corrects to 63 - PFTs 09/04/2014 VC 2.29 (62%) no obst with dlco 38 corrects to 83%  - RA sats 62% 11/23/2013 at rest > started 24h 02 (see chronic respiratory failure)  - 12/12/2014  Walked RA  2 laps @ 185 ft each stopped due to  desat to 84% nl pace  - 11/23/2013 ESR 95 rx pred 20 mg bid  >  12/08/2013 = 14 > rx per rheum   No evidence of sign progression off prednisone > f/u with pfts planned for June 2016

## 2014-12-13 NOTE — Assessment & Plan Note (Signed)
-  RA sats 62% 11/23/2013  - 11/23/2013   Walked 4lpm  x one lap @ 185 stopped due to desat to 73%  - 12/08/2013  Rest 94% RA,  Walked 4lpm x 3 laps @ 185 ft each stopped due to  End of study, sats still 94%  - 03/08/2014  Walked RA  2 laps @ 185 ft each stopped due to sats 86 corrected on 2lpm   - 12/12/2014  Walked RA x 2 laps @ 185 ft each stopped due to  sats 84 nl pace   As of  12/12/14  rec no need for 02 at rest or room to room, 2lpm at hs and   2lpm with walking outside more than 200 ft

## 2014-12-16 ENCOUNTER — Encounter: Payer: Self-pay | Admitting: Internal Medicine

## 2015-01-06 ENCOUNTER — Other Ambulatory Visit: Payer: Self-pay | Admitting: Internal Medicine

## 2015-04-05 ENCOUNTER — Other Ambulatory Visit: Payer: Self-pay | Admitting: Internal Medicine

## 2015-04-11 ENCOUNTER — Ambulatory Visit (INDEPENDENT_AMBULATORY_CARE_PROVIDER_SITE_OTHER)
Admission: RE | Admit: 2015-04-11 | Discharge: 2015-04-11 | Disposition: A | Discharge status: Home / Self Care | Payer: Medicare Other | Source: Ambulatory Visit | Attending: Internal Medicine | Admitting: Internal Medicine

## 2015-04-11 ENCOUNTER — Ambulatory Visit (INDEPENDENT_AMBULATORY_CARE_PROVIDER_SITE_OTHER): Payer: Medicare Other | Admitting: Internal Medicine

## 2015-04-11 ENCOUNTER — Encounter: Payer: Self-pay | Admitting: Internal Medicine

## 2015-04-11 VITALS — BP 146/80 | HR 45 | Ht 65.0 in | Wt 157.0 lb

## 2015-04-11 DIAGNOSIS — J9611 Chronic respiratory failure with hypoxia: Secondary | ICD-10-CM | POA: Diagnosis not present

## 2015-04-11 DIAGNOSIS — J8410 Pulmonary fibrosis, unspecified: Secondary | ICD-10-CM

## 2015-04-11 LAB — PULMONARY FUNCTION TEST
DL/VA % pred: 75 %
DL/VA: 3.22 ml/min/mmHg/L
DLCO unc % pred: 41 %
DLCO unc: 10.6 ml/min/mmHg
FEF 25-75 Post: 2.61 L/s
FEF 25-75 Pre: 2.57 L/s
FEF2575-%Change-Post: 1 %
FEF2575-%Pred-Post: 131 %
FEF2575-%Pred-Pre: 129 %
FEV1-%Change-Post: 1 %
FEV1-%Pred-Post: 77 %
FEV1-%Pred-Pre: 76 %
FEV1-Post: 2.02 L
FEV1-Pre: 1.99 L
FEV1FVC-%Change-Post: 0 %
FEV1FVC-%Pred-Pre: 119 %
FEV6-%Change-Post: 1 %
FEV6-%Pred-Post: 68 %
FEV6-%Pred-Pre: 68 %
FEV6-Post: 2.31 L
FEV6-Pre: 2.29 L
FEV6FVC-%Change-Post: 0 %
FEV6FVC-%Pred-Post: 107 %
FEV6FVC-%Pred-Pre: 107 %
FVC-%Change-Post: 1 %
FVC-%Pred-Post: 64 %
FVC-%Pred-Pre: 63 %
FVC-Post: 2.33 L
FVC-Pre: 2.29 L
Post FEV1/FVC ratio: 87 %
Post FEV6/FVC ratio: 100 %
Pre FEV1/FVC ratio: 87 %
Pre FEV6/FVC Ratio: 100 %

## 2015-04-11 NOTE — Progress Notes (Signed)
Quick Note:  Spoke with pt and notified of results per Dr. Wert. Pt verbalized understanding and denied any questions.  ______ 

## 2015-04-11 NOTE — Patient Instructions (Signed)
Please remember to go to the  x-ray department downstairs for your tests - we will call you with the results when they are available.    If feel like loosing ground with your wind when you walk or coughing, see [place] right away - otherwise return yearly.

## 2015-04-11 NOTE — Progress Notes (Signed)
PFT done today. 

## 2015-04-11 NOTE — Progress Notes (Addendum)
Subjective:    Patient ID: Gilbert Dominguez, male    DOB: Jan 18, 1946   MRN: [id number]   Brief patient profile:  73  yowm quit smoking 1996 with dx of RA around 2010 followed by [facility] referred 11/23/2013 to pulmonary clinic for ? ILD with pfts w/w mod restriction  09/04/2014 unchanged from 09/2013   History of Present Illness  11/23/2013 1st [facility] office visit/ Gilbert Dominguez cc new doe x Jan 2014 and stopped all RA meds(mtx) in august of 2014 - onset was insidious and gradual to point where walk 50 ft esp last month assoc with dry cough but ok  control of arthritis ( vs historical control) and mild chronic nasal congestion s purulent secretions or sinus pain or epistaxis  rec Please see patient coordinator before you leave today  to schedule 02 24/7  2lpm at rest and 4lpm with activity  Prednisone 20 mg twice daily with meals  Pantoprazole (protonix) 40 mg   Take 30-60 min before first meal of the day and Pepcid 20 mg one bedtime until return to office - this is the best way to tell whether stomach acid is contributing to your problem.   GERD diet reviewed   03/08/2014 f/u ov/Gilbert Dominguez re: ILD / 02 dep at hs and ex not using 02 as rec  Chief Complaint  Patient presents with  . Follow-up    c/o nonprod cough after cold food/drinks.  No other complaints at this time.   can walk medium pace flat s 02 for 15-20 min and sats are in mid 80s  arthritis and breathing much better since rx with prednisone being tapered down by Gilbert Dominguez to 20 mg per day  rec No need for 02 at rest or room to room walking Please use 2lpm at hs and   2lpm with walking outside more than 200 ft  Please see patient coordinator before you leave today  to schedule a portable system for your daily walk   09/04/2014 f/u ov/Gilbert Dominguez re: ILD/ new cough  Chief Complaint  Patient presents with  . Follow-up    PFT done today. Breathing is unchanged. Pt c/o cough for the past 6 wks- "makes me have to clear throat".   just using  02 at hs / Not limited by breathing from desired activities   Cough is new problem x 6 weeks day > noct min white mucus  On fosfamax  Weaned off pred effective early August  Started acutely with ? Gilbert Dominguez feels like persistent pnds  rec I recommend against taking fosfamax as long as you are coughing as it may contribute to the cough  For drainage >>take chlortrimeton (chlorpheniramine) 4 mg every 4 hours available over the counter (may cause drowsiness, available over the counter) For cough>> Take delsym two tsp every 12 hours   to suppress the urge to cough. Swallowing water or using ice chips/non mint and non menthol containing candies (such as lifesavers or sugarless jolly ranchers) are also effective.  You should rest your voice and avoid activities that you know make you cough. Please remember to go to the  x-ray department downstairs for your tests - we will call you with the results when they are available. Please schedule a follow up visit in 3 months but call sooner if needed  Late add:  Repeat walking sats Ra next ov    12/12/2014 f/u ov/[name] re: PF/ RA Chief Complaint  Patient presents with  . Follow-up    Pt states  that his breathing is overall doing well. He is only using o2 with sleep @ 2lpm. He states not clearing his throat as often.    no longer on fosfamax  On chlorpheniramine>>  throat clearing and drainage better   rec You will need to learn to walk at a slow pace or wear 02 when doing more than 200 ft at a normal pace   04/11/2015 f/u ov/[name] re:  Chief Complaint  Patient presents with  . Follow-up    PFT done today. Breathing is unchanged. No new co's today.   02 at hs only 2 lpm       Not limited by breathing from desired activities    No obvious day to day or daytime variabilty ex tol or assoc  cp or chest tightness, subjective wheeze overt sinus or hb symptoms. No unusual exp hx or h/o childhood pna/ asthma or knowledge of premature birth.  Sleeping ok without  nocturnal  or early am exacerbation  of respiratory  c/o's or need for noct saba. Also denies any obvious fluctuation of symptoms with weather or environmental changes or other aggravating or alleviating factors except as outlined above   Current Medications, Allergies, Complete Past Medical History, Past Surgical History, Family History, and Social History were reviewed in [employer] record.  ROS  The following are not active complaints unless bolded sore throat, dysphagia, dental problems, itching, sneezing,  nasal congestion/pnds sensation s   excess/ purulent secretions, ear ache,   fever, chills, sweats, unintended wt loss, pleuritic or exertional cp, hemoptysis,  orthopnea pnd or leg swelling, presyncope, palpitations, heartburn, abdominal pain, anorexia, nausea, vomiting, diarrhea  or change in bowel or urinary habits, change in stools or urine, dysuria,hematuria,  rash, arthralgias better , visual complaints, headache, numbness weakness or ataxia or problems with walking or coordination,  change in mood/affect or memory.                    Objective:   Physical Exam  amb wm nad/ min cushingnoid  09/04/2014      160 > 12/12/2014   162 > 04/11/2015   158   Wt Readings from Last 3 Encounters:  03/08/14 159 lb (72.122 kg)  12/08/13 154 lb (69.854 kg)  11/23/13 158 lb 3.2 oz (71.759 kg)           HEENT: nl dentition, turbinates, and orophanx. Nl external ear canals without cough reflex   NECK :  without JVD/Nodes/TM/ nl carotid upstrokes bilaterally   LUNGS: min dry insp crackles only  in bases    CV:  RRR  no s3 or murmur or increase in P2, no edema   ABD:  soft and nontender with nl excursion in the supine position. 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 Again noted bilateral fibrotic changes without definite evidence of superimposed infiltrate.       Assessment & Plan:

## 2015-04-14 ENCOUNTER — Encounter: Payer: Self-pay | Admitting: Internal Medicine

## 2015-04-14 NOTE — Assessment & Plan Note (Addendum)
-  RA sats 62% 11/23/2013  - 11/23/2013   Walked 4lpm  x one lap @ 185 stopped due to desat to 73%  - 12/08/2013  Rest 94% RA,  Walked 4lpm x 3 laps @ 185 ft each stopped due to  End of study, sats still 94%  - 03/08/2014  Walked RA  2 laps @ 185 ft each stopped due to sats 86 corrected on 2lpm   - 12/12/2014  Walked RA x 2 laps @ 185 ft each stopped due to  sats 84 nl pace  - 04/11/2015  Walked RA x 2 laps @ 185 ft each stopped due to sats 83%nl pace   Clearly much better than 1.5 y ago with sats 62% at rest RA > he continues on 2lpm at sleep and prn daytime

## 2015-04-14 NOTE — Assessment & Plan Note (Signed)
-  pfts 09/27/14        VC 2.2(59%) no obst with dlco 38 corrects to 63 - PFTs 09/04/2014 VC 2.3 (62%) no obst with dlco 38 corrects to 83%  - RA sats 62% 11/23/2013 at rest > started 24h 02 (see chronic respiratory failure)  - 12/12/2014  Walked RA  2 laps @ 185 ft each stopped due to  desat to 84% nl pace  - 11/23/2013 ESR 95 rx pred 20 mg bid  >  12/08/2013 = 14 > rx per [facility]low  - PFTs 04/11/2015    VC 2.1 s obst and dlco 41 corrects to 75%  I had an extended discussion with the patient reviewing all relevant studies completed to date and  lasting 15 to 20 minutes of a 25 minute visit   As long as systemic RA is well controlled then should do fine unless a med like mtx is considered  Which may cause ILD in absence of RA symptoms but for now appears to be doing relatively well.  Use of PPI is associated with improved survival time and with decreased radiologic fibrosis per King's study published in AJRCCM vol 184 p1390.  Dec 2011  This may not be cause and effect, but given how universally unimpressive and expensive  all the other  Drugs developed to day  have been for pf,   rec start  rx continue ppi / diet/ lifestyle modification and f/u with serial walking sats and continue with yearly  lung volumes for now to put more points on the curve / establish firm baseline before considering additional measures.    Each maintenance medication was reviewed in detail including most importantly the difference between maintenance and prns and under what circumstances the prns are to be triggered using an action plan format that is not reflected in the computer generated alphabetically organized AVS.    Please see instructions for details which were reviewed in writing and the patient given a copy highlighting the part that I personally wrote and discussed at today's ov.

## 2015-04-15 ENCOUNTER — Other Ambulatory Visit: Payer: Self-pay

## 2015-06-12 ENCOUNTER — Other Ambulatory Visit: Payer: Self-pay | Admitting: Internal Medicine

## 2015-06-12 MED ORDER — PANTOPRAZOLE SODIUM 40 MG PO TBEC: DELAYED_RELEASE_TABLET | ORAL | Status: DC

## 2015-06-12 MED ORDER — FAMOTIDINE 20 MG PO TABS: 20.0000 mg | ORAL_TABLET | Freq: Every day | ORAL | Status: DC

## 2015-11-19 ENCOUNTER — Ambulatory Visit (INDEPENDENT_AMBULATORY_CARE_PROVIDER_SITE_OTHER): Payer: Medicare Other | Admitting: Internal Medicine

## 2015-11-19 ENCOUNTER — Telehealth: Payer: Self-pay | Admitting: Internal Medicine

## 2015-11-19 ENCOUNTER — Encounter: Payer: Self-pay | Admitting: Internal Medicine

## 2015-11-19 ENCOUNTER — Ambulatory Visit (INDEPENDENT_AMBULATORY_CARE_PROVIDER_SITE_OTHER)
Admission: RE | Admit: 2015-11-19 | Discharge: 2015-11-19 | Disposition: A | Discharge status: Home / Self Care | Payer: Medicare Other | Source: Ambulatory Visit | Attending: Internal Medicine | Admitting: Internal Medicine

## 2015-11-19 VITALS — BP 122/74 | HR 61 | Ht 70.0 in | Wt 141.6 lb

## 2015-11-19 DIAGNOSIS — J9611 Chronic respiratory failure with hypoxia: Secondary | ICD-10-CM | POA: Diagnosis not present

## 2015-11-19 DIAGNOSIS — J8410 Pulmonary fibrosis, unspecified: Secondary | ICD-10-CM | POA: Diagnosis not present

## 2015-11-19 DIAGNOSIS — I10 Essential (primary) hypertension: Secondary | ICD-10-CM | POA: Insufficient documentation

## 2015-11-19 MED ORDER — VALSARTAN-HYDROCHLOROTHIAZIDE 160-12.5 MG PO TABS: 1.0000 | ORAL_TABLET | Freq: Every day | ORAL | Status: DC

## 2015-11-19 NOTE — Assessment & Plan Note (Signed)
-  RA sats 62% 11/23/2013  - 11/23/2013   Walked 4lpm  x one lap @ 185 stopped due to desat to 73%  - 12/08/2013  Rest 94% RA,  Walked 4lpm x 3 laps @ 185 ft each stopped due to  End of study, sats still 94%  - 03/08/2014  Walked RA  2 laps @ 185 ft each stopped due to sats 86 corrected on 2lpm   - 12/12/2014  Walked RA x 2 laps @ 185 ft each stopped due to  sats 84 nl pace  - 04/11/2015  Walked RA x 2 laps @ 185 ft each stopped due to sats 83%nl pace  - 11/19/2015   Walked RA x one lap @ 185 stopped due to  83% nl pace/ resolved on 2lpm     rec 11/19/2015 >>> no need for 02 at rest or room to room, 2lpm at hs and   2lpm with walking outside

## 2015-11-19 NOTE — Telephone Encounter (Signed)
Result Note     Call pt: Reviewed cxr and no acute change so no change in recommendations made at ov  ---  I spoke with patient about results and he verbalized understanding and had no questions

## 2015-11-19 NOTE — Assessment & Plan Note (Signed)
In the best review of chronic cough to date ( NEJM 2016 375 [phone]) ,  ACEi are now felt to cause cough in up to  20% of pts which is a 4 fold increase from previous reports and does not include the variety of non-specific complaints we see in pulmonary clinic in pts on ACEi but previously attributed to another dx like  Copd/asthma and  include PNDS, throat and chest congestion, "bronchitis", unexplained dyspnea and noct "strangling" sensations, and hoarseness, but also  atypical /refractory GERD symptoms like dysphagia and "bad heartburn"   The only way I know  to prove this is not an "ACEi Case" is a trial off ACEi x a minimum of 6 weeks    Try valsartan 160-12.5 one daily for now and regroup in 6 weeks

## 2015-11-19 NOTE — Progress Notes (Signed)
Quick Note:  LMTCB ______ 

## 2015-11-19 NOTE — Progress Notes (Signed)
Subjective:    Patient ID: Gilbert Dominguez, male    DOB: 03-24-1946   MRN: [id number]   Brief patient profile:  53  yowm quit smoking 1996 with dx of RA around 2010 followed by [facility] referred 11/23/2013 to pulmonary clinic for ? ILD with pfts w/w mod restriction  09/04/2014 unchanged from 09/2013   History of Present Illness  11/23/2013 1st [facility] office visit/ Wert cc new doe x Jan 2014 and stopped all RA meds(mtx) in august of 2014 - onset was insidious and gradual to point where walk 50 ft esp last month assoc with dry cough but ok  control of arthritis ( vs historical control) and mild chronic nasal congestion s purulent secretions or sinus pain or epistaxis  rec Please see patient coordinator before you leave today  to schedule 02 24/7  2lpm at rest and 4lpm with activity  Prednisone 20 mg twice daily with meals  Pantoprazole (protonix) 40 mg   Take 30-60 min before first meal of the day and Pepcid 20 mg one bedtime until return to office - this is the best way to tell whether stomach acid is contributing to your problem.   GERD diet reviewed   03/08/2014 f/u ov/Wert re: ILD / 02 dep at hs and ex not using 02 as rec  Chief Complaint  Patient presents with  . Follow-up    c/o nonprod cough after cold food/drinks.  No other complaints at this time.   can walk medium pace flat s 02 for 15-20 min and sats are in mid 80s  arthritis and breathing much better since rx with prednisone being tapered down by Dr Charlestine Night to 20 mg per day  rec No need for 02 at rest or room to room walking Please use 2lpm at hs and   2lpm with walking outside more than 200 ft  Please see patient coordinator before you leave today  to schedule a portable system for your daily walk   09/04/2014 f/u ov/Wert re: ILD/ new cough  Chief Complaint  Patient presents with  . Follow-up    PFT done today. Breathing is unchanged. Pt c/o cough for the past 6 wks- "makes me have to clear throat".   just using  02 at hs / Not limited by breathing from desired activities   Cough is new problem x 6 weeks day > noct min white mucus  On fosfamax  Weaned off pred effective early August  Started acutely with ? [name] feels like persistent pnds  rec I recommend against taking fosfamax as long as you are coughing as it may contribute to the cough  For drainage >>take chlortrimeton (chlorpheniramine) 4 mg every 4 hours available over the counter (may cause drowsiness, available over the counter) For cough>> Take delsym two tsp every 12 hours   to suppress the urge to cough. Swallowing water or using ice chips/non mint and non menthol containing candies (such as lifesavers or sugarless jolly ranchers) are also effective.        12/12/2014 f/u ov/Wert re: PF/ RA Chief Complaint  Patient presents with  . Follow-up    Pt states that his breathing is overall doing well. He is only using o2 with sleep @ 2lpm. He states not clearing his throat as often.    no longer on fosfamax  On chlorpheniramine>>  throat clearing and drainage better   rec You will need to learn to walk at a slow pace or wear 02 when doing more  than 200 ft at a normal pace    11/19/2015  f/u ov/Wert re: pf/ RA  Chief Complaint  Patient presents with  . Follow-up    Breathing has been worse for the past 3 months. He states he gets SOB walking "very short distances". He also c/o increased cough-non prod, esp after exertion.   Arthritis is better, breathing and coughing worse now on ACEi    Doe = MMRC3 = can't walk 100 yards even at a slow pace at a flat grade s stopping due to sob    No obvious day to day or daytime variabilty ex tol or assoc  cp or chest tightness, subjective wheeze overt sinus or hb symptoms. No unusual exp hx or h/o childhood pna/ asthma or knowledge of premature birth.  Sleeping ok without nocturnal  or early am exacerbation  of respiratory  c/o's or need for noct saba. Also denies any obvious fluctuation of symptoms with  weather or environmental changes or other aggravating or alleviating factors except as outlined above   Current Medications, Allergies, Complete Past Medical History, Past Surgical History, Family History, and Social History were reviewed in [employer] [facility] record.  ROS  The following are not active complaints unless bolded sore throat, dysphagia, dental problems, itching, sneezing,  nasal congestion/pnds sensation s excess/ purulent secretions, ear ache,   fever, chills, sweats, unintended wt loss, pleuritic or exertional cp, hemoptysis,  orthopnea pnd or leg swelling, presyncope, palpitations, heartburn, abdominal pain, anorexia, nausea, vomiting, diarrhea  or change in bowel or urinary habits, change in stools or urine, dysuria,hematuria,  rash, arthralgias better , visual complaints, headache, numbness weakness or ataxia or problems with walking or coordination,  change in mood/affect or memory.             Objective:   Physical Exam  amb wm nad/ min cushingnoid  09/04/2014      160 > 12/12/2014   162 > 04/11/2015   158  > 11/19/2015   142     03/08/14 159 lb (72.122 kg)  12/08/13 154 lb (69.854 kg)  11/23/13 158 lb 3.2 oz (71.759 kg)           HEENT: nl dentition, turbinates, and orophanx. Nl external ear canals without cough reflex   NECK :  without JVD/Nodes/TM/ nl carotid upstrokes bilaterally   LUNGS: min dry insp crackles only  in bases bilaterally R slt > L    CV:  RRR  no s3 or murmur or increase in P2, no edema  - pos Raynaud's changes both hands   ABD:  soft and nontender with nl excursion in the supine position. No bruits or organomegaly, bowel sounds nl  MS:  warm without deformities, calf tenderness, cyanosis- mild to mod clubbng   SKIN: warm and dry without lesions    NEURO:  alert, approp, no deficits      CXR PA and Lateral:   11/19/2015 :    I personally reviewed images and agree with radiology impression as follows:    Chronic  pulmonary fibrotic changes. There has been slight overall increase in the conspicuity of these interstitial markings especially in the right lower lung. No acute cardiopulmonary abnormality is observed.  Labs ordered/ reviewed:      Chemistry      Component Value Date/Time   NA 136 11/23/2013 1257   K 4.2 11/23/2013 1257   CL 104 11/23/2013 1257   CO2 23 11/23/2013 1257   BUN 15 11/23/2013 1257  CREATININE 1.1 11/23/2013 1257      Component Value Date/Time   CALCIUM 10.5 11/23/2013 1257        Lab Results  Component Value Date   WBC 14.0* 11/23/2013   HGB 14.1 11/23/2013   HCT 43.2 11/23/2013   MCV 87.2 11/23/2013   PLT 522.0* 11/23/2013             Lab Results  Component Value Date   ESRSEDRATE 14 12/08/2013   ESRSEDRATE 95* 11/23/2013           Assessment & Plan:

## 2015-11-19 NOTE — Patient Instructions (Addendum)
Stop lisinopril and start avapro 160-12.5 mg daily in its place  Please see patient coordinator before you leave today  to schedule  Portable 02 2lpm when walking outside of your home   Please remember to go to the  x-ray department downstairs for your tests - we will call you with the results when they are available.     Please schedule a follow up office visit in 6 weeks, call sooner if needed

## 2015-11-24 NOTE — Assessment & Plan Note (Signed)
-  pfts 09/27/14        VC 2.2(59%) no obst with dlco 38 corrects to 63 - PFTs 09/04/2014 VC 2.3 (62%) no obst with dlco 38 corrects to 83%  - RA sats 62% 11/23/2013 at rest > started 24h 02 (see chronic respiratory failure)  - 12/12/2014  Walked RA  2 laps @ 185 ft each stopped due to  desat to 84% nl pace  - 11/23/2013 ESR 95 rx pred 20 mg bid  >  12/08/2013 = 14 > rx per [facility]  - PFTs 04/11/2015    VC 2.1 s obst and dlco 41 corrects to 75%   - ESR 11/19/2015 = 14 so the inflammatory component of the problem appears to be well controlled / f/u per Charlestine Night

## 2015-12-31 ENCOUNTER — Ambulatory Visit (INDEPENDENT_AMBULATORY_CARE_PROVIDER_SITE_OTHER): Payer: Medicare Other | Admitting: Internal Medicine

## 2015-12-31 ENCOUNTER — Encounter: Payer: Self-pay | Admitting: Internal Medicine

## 2015-12-31 ENCOUNTER — Other Ambulatory Visit (INDEPENDENT_AMBULATORY_CARE_PROVIDER_SITE_OTHER): Payer: Medicare Other

## 2015-12-31 VITALS — BP 120/70 | HR 75 | Ht 65.0 in | Wt 140.0 lb

## 2015-12-31 DIAGNOSIS — J8410 Pulmonary fibrosis, unspecified: Secondary | ICD-10-CM | POA: Diagnosis not present

## 2015-12-31 DIAGNOSIS — J9611 Chronic respiratory failure with hypoxia: Secondary | ICD-10-CM

## 2015-12-31 DIAGNOSIS — I10 Essential (primary) hypertension: Secondary | ICD-10-CM

## 2015-12-31 LAB — SEDIMENTATION RATE: Sed Rate: 48 mm/hr — ABNORMAL HIGH (ref 0–22)

## 2015-12-31 LAB — BASIC METABOLIC PANEL
BUN: 32 mg/dL — AB (ref 6–23)
CALCIUM: 10.1 mg/dL (ref 8.4–10.5)
CHLORIDE: 106 meq/L (ref 96–112)
CO2: 25 meq/L (ref 19–32)
CREATININE: 1.2 mg/dL (ref 0.40–1.50)
GFR: 63.34 mL/min (ref >60.00)
Glucose, Bld: 128 mg/dL — ABNORMAL HIGH (ref 70–99)
Potassium: 4.6 mEq/L (ref 3.5–5.1)
Sodium: 140 mEq/L (ref 135–145)

## 2015-12-31 MED ORDER — PREDNISONE 10 MG PO TABS: ORAL_TABLET | ORAL | Status: DC

## 2015-12-31 NOTE — Progress Notes (Signed)
Quick Note:  Spoke with pt and notified of results per Dr. Wert. Pt verbalized understanding and denied any questions.  ______ 

## 2015-12-31 NOTE — Patient Instructions (Addendum)
Please remember to go to the lab department downstairs for your tests - we will call you with the results when they are available.    Prednisone 10 mg take 2 each am until cough / breathing better then 1 daily until return  GERD (REFLUX)  is an extremely common cause of respiratory symptoms just like yours , many times with no obvious heartburn at all.    It can be treated with medication, but also with lifestyle changes including elevation of the head of your bed (ideally with 6 inch  bed blocks),  Smoking cessation, avoidance of late meals, excessive alcohol, and avoid fatty foods, chocolate, peppermint, colas, red wine, and acidic juices such as orange juice.  NO MINT OR MENTHOL PRODUCTS SO NO COUGH DROPS  USE SUGARLESS CANDY INSTEAD (Jolley ranchers or Stover's or Life Savers) or even ice chips will also do - the key is to swallow to prevent all throat clearing. NO OIL BASED VITAMINS - use powdered substitutes.    Please schedule a follow up office visit in 6 weeks, call sooner if needed

## 2015-12-31 NOTE — Progress Notes (Signed)
Subjective:    Patient ID: Gilbert Dominguez, male    DOB: 03-24-1946   MRN: [id number]   Brief patient profile:  53  yowm quit smoking 1996 with dx of RA around 2010 followed by [facility] referred 11/23/2013 to pulmonary clinic for ? ILD with pfts w/w mod restriction  09/04/2014 unchanged from 09/2013   History of Present Illness  11/23/2013 1st [facility] office visit/ Gilbert Dominguez cc new doe x Jan 2014 and stopped all RA meds(mtx) in august of 2014 - onset was insidious and gradual to point where walk 50 ft esp last month assoc with dry cough but ok  control of arthritis ( vs historical control) and mild chronic nasal congestion s purulent secretions or sinus pain or epistaxis  rec Please see patient coordinator before you leave today  to schedule 02 24/7  2lpm at rest and 4lpm with activity  Prednisone 20 mg twice daily with meals  Pantoprazole (protonix) 40 mg   Take 30-60 min before first meal of the day and Pepcid 20 mg one bedtime until return to office - this is the best way to tell whether stomach acid is contributing to your problem.   GERD diet reviewed   03/08/2014 f/u ov/Ciearra Rufo re: ILD / 02 dep at hs and ex not using 02 as rec  Chief Complaint  Patient presents with  . Follow-up    c/o nonprod cough after cold food/drinks.  No other complaints at this time.   can walk medium pace flat s 02 for 15-20 min and sats are in mid 80s  arthritis and breathing much better since rx with prednisone being tapered down by Dr Charlestine Night to 20 mg per day  rec No need for 02 at rest or room to room walking Please use 2lpm at hs and   2lpm with walking outside more than 200 ft  Please see patient coordinator before you leave today  to schedule a portable system for your daily walk   09/04/2014 f/u ov/[name] re: ILD/ new cough  Chief Complaint  Patient presents with  . Follow-up    PFT done today. Breathing is unchanged. Pt c/o cough for the past 6 wks- "makes me have to clear throat".   just using  02 at hs / Not limited by breathing from desired activities   Cough is new problem x 6 weeks day > noct min white mucus  On fosfamax  Weaned off pred effective early August  Started acutely with ? Gilbert Dominguez feels like persistent pnds  rec I recommend against taking fosfamax as long as you are coughing as it may contribute to the cough  For drainage >>take chlortrimeton (chlorpheniramine) 4 mg every 4 hours available over the counter (may cause drowsiness, available over the counter) For cough>> Take delsym two tsp every 12 hours   to suppress the urge to cough. Swallowing water or using ice chips/non mint and non menthol containing candies (such as lifesavers or sugarless jolly ranchers) are also effective.        12/12/2014 f/u ov/Tuff Clabo re: PF/ RA Chief Complaint  Patient presents with  . Follow-up    Pt states that his breathing is overall doing well. He is only using o2 with sleep @ 2lpm. He states not clearing his throat as often.    no longer on fosfamax  On chlorpheniramine>>  throat clearing and drainage better   rec You will need to learn to walk at a slow pace or wear 02 when doing more  than 200 ft at a normal pace    11/19/2015  f/u ov/Gilbert Dominguez re: pf/ RA  Chief Complaint  Patient presents with  . Follow-up    Breathing has been worse for the past 3 months. He states he gets SOB walking "very short distances". He also c/o increased cough-non prod, esp after exertion.   Arthritis is better, breathing and coughing worse now on ACEi   Doe = MMRC3 = can't walk 100 yards even at a slow pace at a flat grade s stopping due to sob   REC Stop lisinopril and start avapro 160-12.5 mg daily in its place Please see patient coordinator before you leave today  to schedule  Portable 02 2lpm when walking outside of your home   12/31/2015  f/u ov/Gilbert Dominguez re:  Chief Complaint  Patient presents with  . Follow-up    Breathing is worse, using o2 more often. He also c/o increased cough- occ prod with clear  sputum.     No obvious day to day or daytime variabilty ex tol or assoc  cp or chest tightness, subjective wheeze overt sinus or hb symptoms. No unusual exp hx or h/o childhood pna/ asthma or knowledge of premature birth.  Sleeping ok without nocturnal  or early am exacerbation  of respiratory  c/o's or need for noct saba. Also denies any obvious fluctuation of symptoms with weather or environmental changes or other aggravating or alleviating factors except as outlined above   Current Medications, Allergies, Complete Past Medical History, Past Surgical History, Family History, and Social History were reviewed in [employer] record.  ROS  The following are not active complaints unless bolded sore throat, dysphagia, dental problems, itching, sneezing,  nasal congestion/pnds sensation s excess/ purulent secretions, ear ache,   fever, chills, sweats, unintended wt loss, pleuritic or exertional cp, hemoptysis,  orthopnea pnd or leg swelling, presyncope, palpitations, heartburn, abdominal pain, anorexia, nausea, vomiting, diarrhea  or change in bowel or urinary habits, change in stools or urine, dysuria,hematuria,  rash, arthralgias better , visual complaints, headache, numbness weakness or ataxia or problems with walking or coordination,  change in mood/affect or memory.             Objective:   Physical Exam  amb wm nad / vital signs reviewed   09/04/2014      160 > 12/12/2014   162 > 04/11/2015   158  > 11/19/2015   142  > 12/31/2015 140     03/08/14 159 lb (72.122 kg)  12/08/13 154 lb (69.854 kg)  11/23/13 158 lb 3.2 oz (71.759 kg)           HEENT: nl dentition, turbinates, and oropharynx. Nl external ear canals without cough reflex   NECK :  without JVD/Nodes/TM/ nl carotid upstrokes bilaterally   LUNGS:   dry insp crackles only  in bases bilaterally R  > L    CV:  RRR  no s3 or murmur or increase in P2, no edema  - pos Raynaud's changes both hands   ABD:   soft and nontender with nl excursion in the supine position. No bruits or organomegaly, bowel sounds nl  MS:  warm without deformities, calf tenderness, cyanosis- mild to mod clubbng   SKIN: warm and dry without lesions    NEURO:  alert, approp, no deficits      CXR PA and Lateral:   11/19/2015 :    I personally reviewed images and agree with radiology impression as follows:  Chronic pulmonary fibrotic changes. There has been slight overall increase in the conspicuity of these interstitial markings especially in the right lower lung. No acute cardiopulmonary abnormality is observed.  Labs ordered/ reviewed:    Chemistry      Component Value Date/Time   NA 140 12/31/2015 0959   K 4.6 12/31/2015 0959   CL 106 12/31/2015 0959   CO2 25 12/31/2015 0959   BUN 32* 12/31/2015 0959   CREATININE 1.20 12/31/2015 0959      Component Value Date/Time   CALCIUM 10.1 12/31/2015 0959                      Lab Results  Component Value Date   ESRSEDRATE 48* 12/31/2015   ESRSEDRATE 14 12/08/2013   ESRSEDRATE 95* 11/23/2013        Assessment & Plan:

## 2016-01-01 NOTE — Assessment & Plan Note (Signed)
Try off lisinipril and on avapro 11/19/2015 due to cough/sob   Although even in retrospect it may not be clear the ACEi contributed to the pt's symptoms,  Pt improved off them and adding them back at this point or in the future would risk confusion in interpretation of non-specific respiratory symptoms to which this patient is prone  ie  Better not to muddy the waters here.   rec continue off ace and on diovaan 160/12.5 daily

## 2016-01-01 NOTE — Assessment & Plan Note (Signed)
-  RA sats 62% 11/23/2013  - 11/23/2013   Walked 4lpm  x one lap @ 185 stopped due to desat to 73%  - 12/08/2013  Rest 94% RA,  Walked 4lpm x 3 laps @ 185 ft each stopped due to  End of study, sats still 94%  - 03/08/2014  Walked RA  2 laps @ 185 ft each stopped due to sats 86 corrected on 2lpm   - 12/12/2014  Walked RA x 2 laps @ 185 ft each stopped due to  sats 84 nl pace  - 04/11/2015  Walked RA x 2 laps @ 185 ft each stopped due to sats 83%nl pace  - 11/19/2015   Walked RA x one lap @ 185 stopped due to  83% nl pace/ resolved on 2lpm     rec 01/01/2016 >>> no need for 02 at rest or room to room, 2lpm at hs and   2lpm with walking outside

## 2016-01-01 NOTE — Assessment & Plan Note (Addendum)
-  pfts 09/27/14        VC 2.2(59%) no obst with dlco 38 corrects to 63 - PFTs 09/04/2014 VC 2.3 (62%) no obst with dlco 38 corrects to 83%  - RA sats 62% 11/23/2013 at rest > started 24h 02 (see chronic respiratory failure)  - 12/12/2014  Walked RA  2 laps @ 185 ft each stopped due to  desat to 84% nl pace  - 11/23/2013 ESR 95 rx pred 20 mg bid  >  12/08/2013 = 14 > rx per [facility]  - PFTs 04/11/2015    VC 2.1 s obst and dlco 41 corrects to 75% - ESR 11/19/2015 = 14  - ESR 12/31/2015 =  48 and worse sob/cough > try prednisone 20 mg daily until better and then 10 mg maintenance until follow-up office visit.  The goal with a chronic steroid dependent illness is always arriving at the lowest effective dose that controls the disease/symptoms and not accepting a set "formula" which is based on statistics or guidelines that don't always take into account patient  variability or the natural hx of the dz in every individual patient, which may well vary over time.  For now therefore I recommend the patient maintain  prednisone 20 mg daily until better and then 10 mg maintenance until follow-up office visit.  I had an extended discussion with the patient reviewing all relevant studies completed to date and  lasting 15 to 20 minutes of a 25 minute visit    Each maintenance medication was reviewed in detail including most importantly the difference between maintenance and prns and under what circumstances the prns are to be triggered using an action plan format that is not reflected in the computer generated alphabetically organized AVS.    Please see instructions for details which were reviewed in writing and the patient given a copy highlighting the part that I personally wrote and discussed at today's ov.

## 2016-02-11 ENCOUNTER — Encounter: Payer: Self-pay | Admitting: Internal Medicine

## 2016-02-11 ENCOUNTER — Ambulatory Visit (INDEPENDENT_AMBULATORY_CARE_PROVIDER_SITE_OTHER): Payer: Medicare Other | Admitting: Internal Medicine

## 2016-02-11 VITALS — BP 106/60 | HR 50 | Ht 65.0 in | Wt 136.6 lb

## 2016-02-11 DIAGNOSIS — J9611 Chronic respiratory failure with hypoxia: Secondary | ICD-10-CM | POA: Diagnosis not present

## 2016-02-11 DIAGNOSIS — J8410 Pulmonary fibrosis, unspecified: Secondary | ICD-10-CM

## 2016-02-11 NOTE — Progress Notes (Signed)
Subjective:    Patient ID: Gilbert Dominguez, male    DOB: 03-24-1946   MRN: [id number]   Brief patient profile:  53  yowm quit smoking 1996 with dx of RA around 2010 followed by [facility] referred 11/23/2013 to pulmonary clinic for ? ILD with pfts w/w mod restriction  09/04/2014 unchanged from 09/2013   History of Present Illness  11/23/2013 1st [facility] office visit/ Saphyra Hutt cc new doe x Jan 2014 and stopped all RA meds(mtx) in august of 2014 - onset was insidious and gradual to point where walk 50 ft esp last month assoc with dry cough but ok  control of arthritis ( vs historical control) and mild chronic nasal congestion s purulent secretions or sinus pain or epistaxis  rec Please see patient coordinator before you leave today  to schedule 02 24/7  2lpm at rest and 4lpm with activity  Prednisone 20 mg twice daily with meals  Pantoprazole (protonix) 40 mg   Take 30-60 min before first meal of the day and Pepcid 20 mg one bedtime until return to office - this is the best way to tell whether stomach acid is contributing to your problem.   GERD diet reviewed   03/08/2014 f/u ov/Ajooni Karam re: ILD / 02 dep at hs and ex not using 02 as rec  Chief Complaint  Patient presents with  . Follow-up    c/o nonprod cough after cold food/drinks.  No other complaints at this time.   can walk medium pace flat s 02 for 15-20 min and sats are in mid 80s  arthritis and breathing much better since rx with prednisone being tapered down by Dr Charlestine Night to 20 mg per day  rec No need for 02 at rest or room to room walking Please use 2lpm at hs and   2lpm with walking outside more than 200 ft  Please see patient coordinator before you leave today  to schedule a portable system for your daily walk   09/04/2014 f/u ov/Astria Jordahl re: ILD/ new cough  Chief Complaint  Patient presents with  . Follow-up    PFT done today. Breathing is unchanged. Pt c/o cough for the past 6 wks- "makes me have to clear throat".   just using  02 at hs / Not limited by breathing from desired activities   Cough is new problem x 6 weeks day > noct min white mucus  On fosfamax  Weaned off pred effective early August  Started acutely with ? [name] feels like persistent pnds  rec I recommend against taking fosfamax as long as you are coughing as it may contribute to the cough  For drainage >>take chlortrimeton (chlorpheniramine) 4 mg every 4 hours available over the counter (may cause drowsiness, available over the counter) For cough>> Take delsym two tsp every 12 hours   to suppress the urge to cough. Swallowing water or using ice chips/non mint and non menthol containing candies (such as lifesavers or sugarless jolly ranchers) are also effective.        12/12/2014 f/u ov/Arley Garant re: PF/ RA Chief Complaint  Patient presents with  . Follow-up    Pt states that his breathing is overall doing well. He is only using o2 with sleep @ 2lpm. He states not clearing his throat as often.    no longer on fosfamax  On chlorpheniramine>>  throat clearing and drainage better   rec You will need to learn to walk at a slow pace or wear 02 when doing more  than 200 ft at a normal pace    11/19/2015  f/u ov/Neymar Dowe re: pf/ RA  Chief Complaint  Patient presents with  . Follow-up    Breathing has been worse for the past 3 months. He states he gets SOB walking "very short distances". He also c/o increased cough-non prod, esp after exertion.   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 Follow-up    Breathing is worse, using o2 more often. He also c/o increased cough- occ prod with  clear sputum.   rec Please remember to go to the lab department downstairs for your tests - we will call you with the results when they are available. Prednisone 10 mg take 2 each am until cough / breathing better then 1 daily until return GERD diet    02/11/2016  f/u ov/Lonita Debes re: pf/RA /steroid/02 dep  Chief Complaint  Patient presents with  . Follow-up    Cough and SOB are unchanged. No new co's today.   w/in  A few days of pred 20 felt better then changed to 10 mg several weeks later on 01/29/16 and holding his own. On 2lpm doe = MMRC3 = can't walk 100 yards even at a slow pace at a flat grade s stopping due to sob   Started on fosfamax one week prior to OV  And so far no increase in upper airway cough or urge to clear throat/dysphagia on ppi and H2 HS   No obvious day to day or daytime variabilty ex tol or assoc  cp or chest tightness, subjective wheeze overt sinus or hb symptoms. No unusual exp hx or h/o childhood pna/ asthma or knowledge of premature birth.  Sleeping ok without nocturnal  or early am exacerbation  of respiratory  c/o's or need for noct saba. Also denies any obvious fluctuation of symptoms with weather or environmental changes or other aggravating or alleviating factors except as outlined above   Current Medications, Allergies, Complete Past Medical History, Past Surgical History, Family History, and Social History were reviewed in [employer] record.  ROS  The following are not active complaints unless bolded sore throat, dysphagia, dental problems, itching, sneezing,  nasal congestion/pnds sensation s excess/ purulent secretions, ear ache,   fever, chills, sweats, unintended wt loss, pleuritic or exertional cp, hemoptysis,  orthopnea pnd or leg swelling, presyncope, palpitations, heartburn, abdominal pain, anorexia, nausea, vomiting, diarrhea  or change in bowel or urinary habits, change in stools or urine, dysuria,hematuria,  rash, arthralgias  better , visual complaints, headache, numbness weakness or ataxia or problems with walking or coordination,  change in mood/affect or memory.             Objective:   Physical Exam  amb wm nad / vital signs reviewed   09/04/2014      160 > 12/12/2014   162 > 04/11/2015   158  > 11/19/2015   142  > 12/31/2015 140 > 02/11/2016  137     03/08/14 159 lb (72.122 kg)  12/08/13 154 lb (  69.854 kg)  11/23/13 158 lb 3.2 oz (71.759 kg)           HEENT: nl dentition, turbinates, and oropharynx. Nl external ear canals without cough reflex   NECK :  without JVD/Nodes/TM/ nl carotid upstrokes bilaterally   LUNGS:   dry insp crackles only  in bases bilaterally    CV:  RRR  no s3 or murmur or increase in P2, no edema  - pos Raynaud's changes both hands   ABD:  soft and nontender with nl excursion in the supine position. 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 There has been slight overall increase in the conspicuity of these interstitial markings especially in the right lower lung. No acute cardiopulmonary abnormality is observed.               Assessment & Plan:

## 2016-02-11 NOTE — Assessment & Plan Note (Signed)
-  RA sats 62% 11/23/2013  - 11/23/2013   Walked 4lpm  x one lap @ 185 stopped due to desat to 73%  - 12/08/2013  Rest 94% RA,  Walked 4lpm x 3 laps @ 185 ft each stopped due to  End of study, sats still 94%  - 03/08/2014  Walked RA  2 laps @ 185 ft each stopped due to sats 86 corrected on 2lpm   - 12/12/2014  Walked RA x 2 laps @ 185 ft each stopped due to  sats 84 nl pace  - 04/11/2015  Walked RA x 2 laps @ 185 ft each stopped due to sats 83%nl pace  - 11/19/2015   Walked RA x one lap @ 185 stopped due to  83% nl pace/ resolved on 2lpm     rec 02/11/2016 >>>   2lpm at hs and  Titrate daytime with goal of > 88% at all times

## 2016-02-11 NOTE — Patient Instructions (Addendum)
Prednisone 10 mg take 2 until better then 2 alternating with 1  For two weeks, then reduce to 1 daily and if worsen go back to 20 mg daily and start over   Goal on 02 is to keep it above 88%   If throat clearing or swallowing worsen on the fosfamax we may to consider an alternative but I will notify Dr Charlestine Night of this concern and no need to stop the fosfamax at this point or change to the IV form (Reclast) yearly but this is an option  Return around July 1 for full pfts

## 2016-02-11 NOTE — Assessment & Plan Note (Signed)
-  pfts 09/27/14        VC 2.2(59%) no obst with dlco 38 corrects to 63 - PFTs 09/04/2014 VC 2.3 (62%) no obst with dlco 38 corrects to 83%  - RA sats 62% 11/23/2013 at rest > started 24h 02 (see chronic respiratory failure)  - 12/12/2014  Walked RA  2 laps @ 185 ft each stopped due to  desat to 84% nl pace  - 11/23/2013 ESR 95 rx pred 20 mg bid  >  12/08/2013 = 14 > rx per [facility]  - PFTs 04/11/2015    VC 2.1 s obst and dlco 41 corrects to 75% - ESR 11/19/2015 = 14  - ESR 12/31/2015 =  48 and worse sob/cough > try prednisone 20 mg daily until better and then 10 mg maintenance until follow-up office visit.   Some better on prednisone and agree with Dr Charlestine Night if needs prednisone will need 1) lowest possible dose and 2) biphosphonates, though may need to consider one dose of yearly Reclast instead of oral rx is throat clearing/ cough recur  In terms of the pres dose, The goal with a chronic steroid dependent illness is always arriving at the lowest effective dose that controls the disease/symptoms and not accepting a set "formula" which is based on statistics or guidelines that don't always take into account patient  variability or the natural hx of the dz in every individual patient, which may well vary over time.  For now therefore I recommend the patient maintain  20 as a ceiling and slow taper to 20/10 then 10 when flares  I had an extended discussion with the patient reviewing all relevant studies completed to date and  lasting 15 to 20 minutes of a 25 minute visit    Each maintenance medication was reviewed in detail including most importantly the difference between maintenance and prns and under what circumstances the prns are to be triggered using an action plan format that is not reflected in the computer generated alphabetically organized AVS.    Please see instructions for details which were reviewed in writing and the patient given a copy highlighting the part that I personally wrote and discussed  at today's ov.

## 2016-04-13 ENCOUNTER — Other Ambulatory Visit: Payer: Self-pay | Admitting: Internal Medicine

## 2016-04-20 ENCOUNTER — Ambulatory Visit (INDEPENDENT_AMBULATORY_CARE_PROVIDER_SITE_OTHER): Payer: Medicare Other | Admitting: Internal Medicine

## 2016-04-20 ENCOUNTER — Encounter: Payer: Self-pay | Admitting: Internal Medicine

## 2016-04-20 VITALS — BP 110/70 | HR 60 | Ht 65.0 in | Wt 134.0 lb

## 2016-04-20 DIAGNOSIS — J9611 Chronic respiratory failure with hypoxia: Secondary | ICD-10-CM | POA: Diagnosis not present

## 2016-04-20 DIAGNOSIS — J8410 Pulmonary fibrosis, unspecified: Secondary | ICD-10-CM

## 2016-04-20 LAB — PULMONARY FUNCTION TEST
DL/VA % pred: 47 %
DL/VA: 2.04 ml/min/mmHg/L
DLCO COR % PRED: 22 %
DLCO cor: 5.67 ml/min/mmHg
DLCO unc % pred: 23 %
DLCO unc: 6.05 ml/min/mmHg
FEF 25-75 POST: 2.72 L/s
FEF 25-75 Pre: 2.95 L/sec
FEF2575-%Change-Post: -7 %
FEF2575-%Pred-Post: 141 %
FEF2575-%Pred-Pre: 152 %
FEV1-%Change-Post: 1 %
FEV1-%Pred-Post: 78 %
FEV1-%Pred-Pre: 77 %
FEV1-Post: 2.02 L
FEV1-Pre: 1.98 L
FEV1FVC-%Change-Post: 0 %
FEV1FVC-%PRED-PRE: 119 %
FEV6-%CHANGE-POST: 1 %
FEV6-%PRED-PRE: 67 %
FEV6-%Pred-Post: 69 %
FEV6-POST: 2.29 L
FEV6-Pre: 2.25 L
FEV6FVC-%PRED-POST: 107 %
FEV6FVC-%Pred-Pre: 107 %
FVC-%CHANGE-POST: 1 %
FVC-%PRED-POST: 64 %
FVC-%Pred-Pre: 63 %
FVC-Post: 2.29 L
FVC-Pre: 2.26 L
POST FEV6/FVC RATIO: 100 %
PRE FEV1/FVC RATIO: 88 %
Post FEV1/FVC ratio: 88 %
Pre FEV6/FVC Ratio: 100 %

## 2016-04-20 NOTE — Progress Notes (Signed)
Subjective:    Patient ID: Gilbert Dominguez, male    DOB: 03-24-1946   MRN: [id number]   Brief patient profile:  53  yowm quit smoking 1996 with dx of RA around 2010 followed by [facility] referred 11/23/2013 to pulmonary clinic for ? ILD with pfts w/w mod restriction  09/04/2014 unchanged from 09/2013   History of Present Illness  11/23/2013 1st [facility] office visit/ [facility] cc new doe x Jan 2014 and stopped all RA meds(mtx) in august of 2014 - onset was insidious and gradual to point where walk 50 ft esp last month assoc with dry cough but ok  control of arthritis ( vs historical control) and mild chronic nasal congestion s purulent secretions or sinus pain or epistaxis  rec Please see patient coordinator before you leave today  to schedule 02 24/7  2lpm at rest and 4lpm with activity  Prednisone 20 mg twice daily with meals  Pantoprazole (protonix) 40 mg   Take 30-60 min before first meal of the day and Pepcid 20 mg one bedtime until return to office - this is the best way to tell whether stomach acid is contributing to your problem.   GERD diet reviewed   03/08/2014 f/u ov/[facility]ert re: ILD / 02 dep at hs and ex not using 02 as rec  Chief Complaint  Patient presents with  . Follow-up    c/o nonprod cough after cold food/drinks.  No other complaints at this time.   can walk medium pace flat s 02 for 15-20 min and sats are in mid 80s  arthritis and breathing much better since rx with prednisone being tapered down by Dr Charlestine Night to 20 mg per day  rec No need for 02 at rest or room to room walking Please use 2lpm at hs and   2lpm with walking outside more than 200 ft  Please see patient coordinator before you leave today  to schedule a portable system for your daily walk   09/04/2014 f/u ov/Wert re: ILD/ new cough  Chief Complaint  Patient presents with  . Follow-up    PFT done today. Breathing is unchanged. Pt c/o cough for the past 6 wks- "makes me have to clear throat".   just using  02 at hs / Not limited by breathing from desired activities   Cough is new problem x 6 weeks day > noct min white mucus  On fosfamax  Weaned off pred effective early August  Started acutely with ? [name] feels like persistent pnds  rec I recommend against taking fosfamax as long as you are coughing as it may contribute to the cough  For drainage >>take chlortrimeton (chlorpheniramine) 4 mg every 4 hours available over the counter (may cause drowsiness, available over the counter) For cough>> Take delsym two tsp every 12 hours   to suppress the urge to cough. Swallowing water or using ice chips/non mint and non menthol containing candies (such as lifesavers or sugarless jolly ranchers) are also effective.        12/12/2014 f/u ov/Wert re: PF/ RA Chief Complaint  Patient presents with  . Follow-up    Pt states that his breathing is overall doing well. He is only using o2 with sleep @ 2lpm. He states not clearing his throat as often.    no longer on fosfamax  On chlorpheniramine>>  throat clearing and drainage better   rec You will need to learn to walk at a slow pace or wear 02 when doing more  than 200 ft at a normal pace    11/19/2015  f/u ov/Wert re: pf/ RA  Chief Complaint  Patient presents with  . Follow-up    Breathing has been worse for the past 3 months. He states he gets SOB walking "very short distances". He also c/o increased cough-non prod, esp after exertion.   Arthritis is better, breathing and coughing worse now on ACEi   Doe = MMRC3 = can't walk 100 yards even at a slow pace at a flat grade s stopping due to sob   REC Stop lisinopril and start avapro 160-12.5 mg daily in its place Please see patient coordinator before you leave today  to schedule  Portable 02 2lpm when walking outside of your home   12/31/2015  f/u ov/Wert re: pf/ RA  Chief Complaint  Patient presents with  . Follow-up    Breathing is worse, using o2 more often. He also c/o increased cough- occ prod with  clear sputum.   rec Please remember to go to the lab department downstairs for your tests - we will call you with the results when they are available. Prednisone 10 mg take 2 each am until cough / breathing better then 1 daily until return GERD diet    02/11/2016  f/u ov/Wert re: pf/RA /steroid/02 dep  Chief Complaint  Patient presents with  . Follow-up    Cough and SOB are unchanged. No new co's today.   w/in  A few days of pred 20 felt better then changed to 10 mg several weeks later on 01/29/16 and holding his own. On 2lpm doe = MMRC3 = can't walk 100 yards even at a slow pace at a flat grade s stopping due to sob   Started on fosfamax one week prior to OV  And so far no increase in upper airway cough or urge to clear throat/dysphagia on ppi and H2 HS  rec Prednisone 10 mg take 2 until better then 2 alternating with 1  For two weeks, then reduce to 1 daily and if worsen go back to 20 mg daily and start over  Goal on 02 is to keep it above 88%  If throat clearing or swallowing worsen on the fosfamax we may to consider an alternative but I will notify Dr Charlestine Night of this concern and no need to stop the fosfamax at this point or change to the IV form (Reclast) yearly but this is an option    04/20/2016  f/u ov/Wert re: PF/ RA / pred 20/10 alternating/ 02 2lpmg at bedtime and prn daytime  Chief Complaint  Patient presents with  . Follow-up    PFT's done today. Breathing is unchanged. Cough "may have gotten worse"- non prod.   up and down aisles at HT one day prior to OV  s having to stop at slow pace s 02  MMRC2 = can't walk a nl pace on a flat grade s sob but does fine slow and flat   Arthritis ok control per pt / still mild dysphagia on fosfamax  Def better breathing/cough control with higher doses of pred but trying to maintain at 20/10 for now per rheum      No obvious day to day or daytime variabilty ex tol or assoc  cp or chest tightness, subjective wheeze overt sinus or hb symptoms.  No unusual exp hx or h/o childhood pna/ asthma or knowledge of premature birth.  Sleeping ok without nocturnal  or early am exacerbation  of respiratory  c/o's or need for noct saba. Also denies any obvious fluctuation of symptoms with weather or environmental changes or other aggravating or alleviating factors except as outlined above   Current Medications, Allergies, Complete Past Medical History, Past Surgical History, Family History, and Social History were reviewed in [employer] record.  ROS  The following are not active complaints unless bolded sore throat, dysphagia, dental problems, itching, sneezing,  nasal congestion/pnds sensation s excess/ purulent secretions, ear ache,   fever, chills, sweats, unintended wt loss, pleuritic or exertional cp, hemoptysis,  orthopnea pnd or leg swelling, presyncope, palpitations, heartburn, abdominal pain, anorexia, nausea, vomiting, diarrhea  or change in bowel or urinary habits, change in stools or urine, dysuria,hematuria,  rash, arthralgias better , visual complaints, headache, numbness weakness or ataxia or problems with walking or coordination,  change in mood/affect or memory.             Objective:   Physical Exam  amb wm nad / vital signs reviewed - note sats 90% on 2lpm   09/04/2014      160 > 12/12/2014   162 > 04/11/2015   158  > 11/19/2015   142  > 12/31/2015 140 > 02/11/2016  137 > 04/20/2016  134     03/08/14 159 lb (72.122 kg)  12/08/13 154 lb (69.854 kg)  11/23/13 158 lb 3.2 oz (71.759 kg)           HEENT: nl dentition, turbinates, and oropharynx. Nl external ear canals without cough reflex   NECK :  without JVD/Nodes/TM/ nl carotid upstrokes bilaterally   LUNGS:   dry insp crackles only  in bases bilaterally    CV:  RRR  no s3 or murmur or increase in P2, no edema  - pos Raynaud's changes both hands   ABD:  soft and nontender with nl excursion in the supine position. 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 There has been slight overall increase in the conspicuity of these interstitial markings especially in the right lower lung. No acute cardiopulmonary abnormality is observed.               Assessment & Plan:

## 2016-04-20 NOTE — Assessment & Plan Note (Signed)
-  RA sats 62% 11/23/2013  - 11/23/2013   Walked 4lpm  x one lap @ 185 stopped due to desat to 73%  - 12/08/2013  Rest 94% RA,  Walked 4lpm x 3 laps @ 185 ft each stopped due to  End of study, sats still 94%  - 03/08/2014  Walked RA  2 laps @ 185 ft each stopped due to sats 86 corrected on 2lpm   - 12/12/2014  Walked RA x 2 laps @ 185 ft each stopped due to  sats 84 nl pace  - 04/11/2015  Walked RA x 2 laps @ 185 ft each stopped due to sats 83%nl pace  - 11/19/2015   Walked RA x one lap @ 185 stopped due to  83% nl pace/ resolved on 2lpm     rec 04/20/2016 >>>   2lpm at hs and  Titrate daytime with goal of > 88% at all times

## 2016-04-20 NOTE — Progress Notes (Signed)
PFT done today.

## 2016-04-20 NOTE — Assessment & Plan Note (Addendum)
-  pfts 09/27/14        VC 2.2(59%) no obst with dlco 38 corrects to 63 - PFTs 09/04/2014 VC 2.3 (62%) no obst with dlco 38 corrects to 83%  - RA sats 62% 11/23/2013 at rest > started 24h 02 (see chronic respiratory failure)  - 12/12/2014  Walked RA  2 laps @ 185 ft each stopped due to  desat to 84% nl pace  - 11/23/2013 ESR 95 rx pred 20 mg bid  >  12/08/2013 = 14 > rx per [facility]  - PFTs 04/11/2015    VC 2.1 s obst and dlco 41 corrects to 75% - ESR 11/19/2015 = 14  - ESR 12/31/2015 =  48 and worse sob/cough > try prednisone 20 mg daily until better and then 10 mg maintenance until follow-up office visit. - PFTs  04/20/2016    VC 2.15 (60%) s obst and dlco 23/22 and corrects to 47%   Appears dlco is lower though no change in lung volume  The goal with a chronic steroid dependent illness is always arriving at the lowest effective dose that controls the disease/symptoms and not accepting a set "formula" which is based on statistics or guidelines that don't always take into account patient  variability or the natural hx of the dz in every individual patient, which may well vary over time.  For now therefore I recommend the patient maintain  20 a/w 10 mg per day unless otherwise advised by Dr Charlestine Night (will notify cough slt worse as is dlco at lower dose)    I had an extended discussion with the patient reviewing all relevant studies completed to date and  lasting 15 to 20 minutes of a 25 minute visit    Each maintenance medication was reviewed in detail including most importantly the difference between maintenance and prns and under what circumstances the prns are to be triggered using an action plan format that is not reflected in the computer generated alphabetically organized AVS.    Please see instructions for details which were reviewed in writing and the patient given a copy highlighting the part that I personally wrote and discussed at today's ov.

## 2016-04-20 NOTE — Patient Instructions (Signed)
No change in recommendations  We will be referring you to pulmonary rehab   Please schedule a follow up visit in 3 months but call sooner if needed

## 2016-04-21 ENCOUNTER — Encounter: Payer: Self-pay | Admitting: Internal Medicine

## 2016-05-11 ENCOUNTER — Other Ambulatory Visit: Payer: Self-pay | Admitting: Internal Medicine

## 2016-06-29 ENCOUNTER — Other Ambulatory Visit: Payer: Self-pay | Admitting: Internal Medicine

## 2016-07-13 ENCOUNTER — Other Ambulatory Visit: Payer: Self-pay | Admitting: Internal Medicine

## 2016-07-21 ENCOUNTER — Ambulatory Visit (INDEPENDENT_AMBULATORY_CARE_PROVIDER_SITE_OTHER): Payer: Medicare Other | Admitting: Internal Medicine

## 2016-07-21 ENCOUNTER — Encounter: Payer: Self-pay | Admitting: Internal Medicine

## 2016-07-21 VITALS — BP 110/66 | HR 79 | Ht 65.0 in | Wt 134.0 lb

## 2016-07-21 DIAGNOSIS — J8410 Pulmonary fibrosis, unspecified: Secondary | ICD-10-CM | POA: Diagnosis not present

## 2016-07-21 DIAGNOSIS — J9611 Chronic respiratory failure with hypoxia: Secondary | ICD-10-CM

## 2016-07-21 NOTE — Patient Instructions (Addendum)
Please see patient coordinator before you leave today  to schedule pulmonary rehab   Ok to adjust 02 to keep your saturations above 88% at all times   If breathing or coughing worse please increase prednisone up to 20 mg daily until better then again step down to 20 alternating with 10 mg daily   Please schedule a follow up visit in 3 months but call sooner if needed

## 2016-07-21 NOTE — Progress Notes (Signed)
Subjective:    Patient ID: Gilbert Dominguez, male    DOB: 02-07-1946   MRN: [id number]   Brief patient profile:  50  yowm quit smoking 1996 with dx of RA around 2010 followed by [facility] referred 11/23/2013 to pulmonary clinic for ? ILD with pfts w/w mod restriction  09/04/2014 unchanged from 09/2013      History of Present Illness  11/23/2013 1st [facility] office visit/ Gilbert Dominguez cc new doe x Jan 2014 and stopped all RA meds(mtx) in august of 2014 - onset was insidious and gradual to point where walk 50 ft esp last month assoc with dry cough but ok  control of arthritis ( vs historical control) and mild chronic nasal congestion s purulent secretions or sinus pain or epistaxis  rec Please see patient coordinator before you leave today  to schedule 02 24/7  2lpm at rest and 4lpm with activity  Prednisone 20 mg twice daily with meals  Pantoprazole (protonix) 40 mg   Take 30-60 min before first meal of the day and Pepcid 20 mg one bedtime until return to office - this is the best way to tell whether stomach acid is contributing to your problem.   GERD diet reviewed   03/08/2014 f/u ov/Gilbert Dominguez re: ILD / 02 dep at hs and ex not using 02 as rec  Chief Complaint  Patient presents with  . Follow-up    c/o nonprod cough after cold food/drinks.  No other complaints at this time.   can walk medium pace flat s 02 for 15-20 min and sats are in mid 80s  arthritis and breathing much better since rx with prednisone being tapered down by Gilbert Dominguez to 20 mg per day  rec No need for 02 at rest or room to room walking Please use 2lpm at hs and   2lpm with walking outside more than 200 ft  Please see patient coordinator before you leave today  to schedule a portable system for your daily walk   09/04/2014 f/u ov/Gilbert Dominguez re: ILD/ new cough  Chief Complaint  Patient presents with  . Follow-up    PFT done today. Breathing is unchanged. Pt c/o cough for the past 6 wks- "makes me have to clear throat".   just  using 02 at hs / Not limited by breathing from desired activities   Cough is new problem x 6 weeks day > noct min white mucus  On fosfamax  Weaned off pred effective early August  Started acutely with ? Gilbert Dominguez feels like persistent pnds  rec I recommend against taking fosfamax as long as you are coughing as it may contribute to the cough  For drainage >>take chlortrimeton (chlorpheniramine) 4 mg every 4 hours available over the counter (may cause drowsiness, available over the counter) For cough>> Take delsym two tsp every 12 hours   to suppress the urge to cough. Swallowing water or using ice chips/non mint and non menthol containing candies (such as lifesavers or sugarless jolly ranchers) are also effective.        12/12/2014 f/u ov/Gilbert Dominguez re: PF/ RA Chief Complaint  Patient presents with  . Follow-up    Pt states that his breathing is overall doing well. He is only using o2 with sleep @ 2lpm. He states not clearing his throat as often.    no longer on fosfamax  On chlorpheniramine>>  throat clearing and drainage better   rec You will need to learn to walk at a slow pace or wear 02  when doing more than 200 ft at a normal pace    11/19/2015  f/u ov/Gilbert Dominguez re: pf/ RA  Chief Complaint  Patient presents with  . Follow-up    Breathing has been worse for the past 3 months. He states he gets SOB walking "very short distances". He also c/o increased cough-non prod, esp after exertion.   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 Follow-up    Breathing is worse, using o2 more often. He also c/o increased cough- occ  prod with clear sputum.   rec Please remember to go to the lab department downstairs for your tests - we will call you with the results when they are available. Prednisone 10 mg take 2 each am until cough / breathing better then 1 daily until return GERD diet    02/11/2016  f/u ov/Gilbert Dominguez re: pf/RA /steroid/02 dep  Chief Complaint  Patient presents with  . Follow-up    Cough and SOB are unchanged. No new co's today.   w/in  A few days of pred 20 felt better then changed to 10 mg several weeks later on 01/29/16 and holding his own. On 2lpm doe = MMRC3 = can't walk 100 yards even at a slow pace at a flat grade s stopping due to sob   Started on fosfamax one week prior to OV  And so far no increase in upper airway cough or urge to clear throat/dysphagia on ppi and H2 HS  rec Prednisone 10 mg take 2 until better then 2 alternating with 1  For two weeks, then reduce to 1 daily and if worsen go back to 20 mg daily and start over  Goal on 02 is to keep it above 88%  If throat clearing or swallowing worsen on the fosfamax we may to consider an alternative but I will notify Gilbert Dominguez of this concern and no need to stop the fosfamax at this point or change to the IV form (Reclast) yearly but this is an option    04/20/2016  f/u ov/Gilbert Dominguez re: PF/ RA / pred 20/10 alternating/ 02 2lpmg at bedtime and prn daytime  Chief Complaint  Patient presents with  . Follow-up    PFT's done today. Breathing is unchanged. Cough "may have gotten worse"- non prod.   up and down aisles at HT one day prior to OV  s having to stop at slow pace s 02  MMRC2 = can't walk a nl pace on a flat grade s sob but does fine slow and flat   Arthritis ok control per pt / still mild dysphagia on fosfamax  Def better breathing/cough control with higher doses of pred but trying to maintain at 20/10 for now per rheum rec No change in recommendations We will be referring you to pulmonary rehab     07/21/2016  f/u ov/Gilbert Dominguez re:   PF/ RA /  still on pred 20/10 and 02 2lpm 24/7  Chief Complaint  Patient presents with  . Follow-up    Pt. states breathing is doing  good, Pt. c/o of coughing, Pt. feels SOB with activity No wheezing, pt. denies chest pain  doe still MMRC2 = can't walk a nl pace on a flat grade s sob but does fine slow and flat eg walking at [facility] on 2lpm but not monitoring sats as rec and never titrates flow up using a pulsed system with activity      No obvious day to day or daytime variabilty ex tol or assoc excess/ purulent sputum or mucus plugs   cp or chest tightness, subjective wheeze overt sinus or hb symptoms. No unusual exp hx or h/o childhood pna/ asthma or knowledge of premature birth.  Sleeping ok without nocturnal  or early am exacerbation  of respiratory  c/o's or need for noct saba. Also denies any obvious fluctuation of symptoms with weather or environmental changes or other aggravating or alleviating factors except as outlined above   Current Medications, Allergies, Complete Past Medical History, Past Surgical History, Family History, and Social History were reviewed in [employer] record.  ROS  The following are not active complaints unless bolded sore throat, dysphagia, dental problems, itching, sneezing,  nasal congestion/pnds sensation s excess/ purulent secretions, ear ache,   fever, chills, sweats, unintended wt loss, pleuritic or exertional cp, hemoptysis,  orthopnea pnd or leg swelling, presyncope, palpitations, heartburn, abdominal pain, anorexia, nausea, vomiting, diarrhea  or change in bowel or urinary habits, change in stools or urine, dysuria,hematuria,  rash, arthralgias   visual complaints, headache, numbness weakness or ataxia or problems with walking or coordination,  change in mood/affect or memory.             Objective:   Physical Exam  amb wm nad / vital signs reviewed - note sats 86% on arrival. 88% sitting on 2lpm   09/04/2014      160 > 12/12/2014   162 >  04/11/2015   158  > 11/19/2015   142  > 12/31/2015 140 > 02/11/2016  137 > 04/20/2016  134 > 07/21/2016 134     03/08/14 159 lb (72.122 kg)  12/08/13 154 lb (69.854 kg)  11/23/13 158 lb 3.2 oz (71.759 kg)           HEENT: nl dentition, turbinates, and oropharynx. Nl external ear canals without cough reflex   NECK :  without JVD/Nodes/TM/ nl carotid upstrokes bilaterally   LUNGS:   dry insp crackles only  in bases bilaterally    CV:  RRR  no s3 or murmur or increase in P2, no edema  - pos Raynaud's changes both hands   ABD:  soft and nontender with nl excursion in the supine position. No bruits or organomegaly, bowel sounds nl  MS:  warm without deformities, calf tenderness, cyanosis- mild to mod clubbng   SKIN: warm and dry without lesions    NEURO:  alert, approp, no deficits        CXR PA and Lateral:   11/19/2015 :    I personally reviewed images and agree with radiology impression as follows:    Chronic pulmonary fibrotic changes. There has been slight overall increase in the conspicuity of these interstitial markings especially in the right lower lung. No acute cardiopulmonary abnormality is observed.               Assessment & Plan:

## 2016-07-21 NOTE — Assessment & Plan Note (Signed)
-  pfts 09/27/14        VC 2.2(59%) no obst with dlco 38 corrects to 63 - PFTs 09/04/2014 VC 2.3 (62%) no obst with dlco 38 corrects to 83%  - RA sats 62% 11/23/2013 at rest > started 24h 02 (see chronic respiratory failure)  - 12/12/2014  Walked RA  2 laps @ 185 ft each stopped due to  desat to 84% nl pace  - 11/23/2013 ESR 95 rx pred 20 mg bid  >  12/08/2013 = 14 > rx per [facility]  - PFTs 04/11/2015    VC 2.1 s obst and dlco 41 corrects to 75% - ESR 11/19/2015 = 14  - ESR 12/31/2015 =  48 and worse sob/cough > try prednisone 20 mg daily until better and then 10 mg maintenance until follow-up office visit. - PFTs  04/20/2016    VC 2.15 (60%) s obst and dlco 23/22 and corrects to 47%  - 07/21/2016 Referred to rehab      The goal with a chronic steroid dependent illness is always arriving at the lowest effective dose that controls the disease/symptoms and not accepting a set "formula" which is based on statistics or guidelines that don't always take into account patient  variability or the natural hx of the dz in every individual patient, which may well vary over time.  For now therefore I recommend the patient maintain  A floor of 20/10 and a ceiling of 20 mg daily   I had an extended discussion with the patient reviewing all relevant studies completed to date and  lasting 15 to 20 minutes of a 25 minute visit    Each maintenance medication was reviewed in detail including most importantly the difference between maintenance and prns and under what circumstances the prns are to be triggered using an action plan format that is not reflected in the computer generated alphabetically organized AVS.    Please see instructions for details which were reviewed in writing and the patient given a copy highlighting the part that I personally wrote and discussed at today's ov.

## 2016-07-21 NOTE — Assessment & Plan Note (Signed)
-  RA sats 62% 11/23/2013  - 11/23/2013   Walked 4lpm  x one lap @ 185 stopped due to desat to 73%  - 12/08/2013  Rest 94% RA,  Walked 4lpm x 3 laps @ 185 ft each stopped due to  End of study, sats still 94%  - 03/08/2014  Walked RA  2 laps @ 185 ft each stopped due to sats 86 corrected on 2lpm   - 12/12/2014  Walked RA x 2 laps @ 185 ft each stopped due to  sats 84 nl pace  - 04/11/2015  Walked RA x 2 laps @ 185 ft each stopped due to sats 83%nl pace  - 11/19/2015   Walked RA x one lap @ 185 stopped due to  83% nl pace/ resolved on 2lpm     rec 07/21/2016 >>>   2lpm at hs and  Titrate daytime with goal of > 88% at all times

## 2016-07-31 ENCOUNTER — Ambulatory Visit (HOSPITAL_COMMUNITY): Payer: Medicare Other

## 2016-08-07 ENCOUNTER — Encounter (HOSPITAL_COMMUNITY): Payer: Self-pay

## 2016-08-07 ENCOUNTER — Encounter (HOSPITAL_COMMUNITY)
Admission: RE | Admit: 2016-08-07 | Discharge: 2016-08-07 | Disposition: A | Discharge status: Home / Self Care | Payer: Medicare Other | Source: Ambulatory Visit | Attending: Internal Medicine | Admitting: Internal Medicine

## 2016-08-07 VITALS — BP 99/51 | HR 77 | Resp 20 | Ht 64.0 in | Wt 132.4 lb

## 2016-08-07 DIAGNOSIS — J9611 Chronic respiratory failure with hypoxia: Secondary | ICD-10-CM | POA: Diagnosis not present

## 2016-08-07 DIAGNOSIS — R0600 Dyspnea, unspecified: Secondary | ICD-10-CM | POA: Insufficient documentation

## 2016-08-07 DIAGNOSIS — J8410 Pulmonary fibrosis, unspecified: Secondary | ICD-10-CM | POA: Diagnosis not present

## 2016-08-07 HISTORY — DX: Chronic respiratory failure, unspecified whether with hypoxia or hypercapnia: J96.10

## 2016-08-07 HISTORY — DX: Pulmonary fibrosis, unspecified: J84.10

## 2016-08-07 HISTORY — DX: Essential (primary) hypertension: I10

## 2016-08-07 NOTE — Progress Notes (Signed)
Gilbert Dominguez 70 y.o. male Pulmonary Rehab Orientation Note Patient arrived today in [facility] for orientation to Pulmonary Rehab. He was transported from [employer] via wheel chair. He does carry portable oxygen. Per pt, he uses oxygen continuously. Color good, skin warm and dry. Patient is oriented to time and place. Patient's medical history, psychosocial health, and medications reviewed. Psychosocial assessment reveals pt lives with their spouse. His daughter is moving in with them at the end of the month in order to "cut down on her bills". Pt is currently retired from [employer]. He doesn't have any hobbies but enjoys spending time outdoors and visiting with homebound hospice patients. Pt reports his stress level is low. Areas of stress/anxiety include his wife's health. She is on peritoneal dialysis and has diabetes. Pt does not exhibit signs of depression. He has been diagnosed with bipolar in the past but was taken off lithium last year. He denies symptoms of mania or depression at this time. PHQ2/9 score 0/na. Pt shows good  coping skills with positive outlook. He is offered emotional support and reassurance. Will continue to monitor and evaluate progress toward psychosocial goal(s) of remaining positive about his medical future. Physical assessment reveals heart rate is normal. Breath sounds clear to auscultation, no wheezes, rales, or rhonchi in the upper lobes bilat. Course crackles noted to lower lobes bilat. Grip strength equal, strong. Distal pulses palpable. 2+ pitting edema noted to lower legs, R>L. Patient reports he does take medications as prescribed. Patient states he follows a Regular diet. The patient reports no specific efforts to gain or lose weight.. Patient's weight will be monitored closely. Demonstration and practice of PLB using pulse oximeter. Patient able to return demonstration satisfactorily. Safety and hand hygiene in the exercise area reviewed with  patient. Patient voices understanding of the information reviewed. Department expectations discussed with patient and achievable goals were set. The patient shows enthusiasm about attending the program and we look forward to working with this nice gentleman. The patient is scheduled for a 6 min walk test on 08/11/16 and to begin exercise on 08/18/16 in the 1:30 class.

## 2016-08-11 ENCOUNTER — Encounter (HOSPITAL_COMMUNITY)
Admission: RE | Admit: 2016-08-11 | Discharge: 2016-08-11 | Disposition: A | Discharge status: Home / Self Care | Payer: Medicare Other | Source: Ambulatory Visit | Attending: Internal Medicine | Admitting: Internal Medicine

## 2016-08-11 DIAGNOSIS — J9611 Chronic respiratory failure with hypoxia: Secondary | ICD-10-CM | POA: Diagnosis not present

## 2016-08-11 NOTE — Progress Notes (Signed)
Pulmonary Individual Treatment Plan  Patient Details  Name: Gilbert Dominguez MRN: [id number] Date of Birth: 07-Dec-1945 Referring Provider:   April Manson Pulmonary Rehab Walk Test from 08/11/2016 in [place]  Referring Provider  Dr. Melvyn Novas      Initial Encounter Date:  Flowsheet Row Pulmonary Rehab Walk Test from 08/11/2016 in [place]  Date  08/11/16  Referring Provider  Dr. Melvyn Novas      Visit Diagnosis: No diagnosis found.  Patient's Home Medications on Admission:   Current Outpatient Prescriptions:  .  alendronate (FOSAMAX) 70 MG tablet, Take 70 mg by mouth once a week. Take with a full glass of water on an empty stomach., Disp: , Rfl:  .  aspirin 81 MG tablet, Take 81 mg by mouth daily., Disp: , Rfl:  .  Calcium Carbonate-Vit D-Min (CALCIUM 600+D PLUS MINERALS PO), Take 1 tablet by mouth 2 (two) times daily., Disp: , Rfl:  .  famotidine (PEPCID) 20 MG tablet, TAKE 1 TABLET BY MOUTH AT BEDTIME, Disp: 30 tablet, Rfl: 0 .  furosemide (LASIX) 20 MG tablet, Take 20 mg by mouth every other day., Disp: , Rfl:  .  loratadine (CLARITIN) 10 MG tablet, Take 10 mg by mouth daily., Disp: , Rfl:  .  OXYGEN, 2 lpm with sleep and as needed with exertion, Disp: , Rfl:  .  pantoprazole (PROTONIX) 40 MG tablet, TAKE 1 TABLET BY MOUTH EVERY DAY *TAKE 30-60 MINUTES BEFORE FIRST MEAL OF THE DAY*, Disp: 30 tablet, Rfl: 11 .  predniSONE (DELTASONE) 10 MG tablet, TAKE 2 DAILY UNTIL BETTER THEN ONE DAILY UNTIL RETURN (Patient taking differently: TAKE 2 DAILY UNTIL BETTER THEN ONE DAILY), Disp: 60 tablet, Rfl: 2 .  Pseudoephedrine-Guaifenesin (MUCINEX D MAX STRENGTH) [phone] MG TB12, Take by mouth., Disp: , Rfl:  .  tadalafil (CIALIS) 20 MG tablet, Take 20 mg by mouth daily as needed for erectile dysfunction., Disp: , Rfl:  .  terazosin (HYTRIN) 1 MG capsule, Take 2 mg by mouth at bedtime. , Disp: , Rfl:  .  valsartan-hydrochlorothiazide  (DIOVAN HCT) 160-12.5 MG tablet, Take 1 tablet by mouth daily. (Patient taking differently: Take 0.5 tablets by mouth daily. ), Disp: 30 tablet, Rfl: 11  Past Medical History: Past Medical History:  Diagnosis Date  . Chronic respiratory failure ([place])   . Hypertension   . 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 Rheumatoid arthritis(714.0)     Tobacco Use: History  Smoking Status  . Former Smoker  . Packs/day: 1.00  . Years: 36.00  . Types: Cigarettes  . Quit date: 10/14/1995  Smokeless Tobacco  . Not on file    Labs: Recent Review Flowsheet Data    There is no flowsheet data to display.      Capillary Blood Glucose: No results found for: GLUCAP   ADL UCSD:   Pulmonary Function Assessment:     Pulmonary Function Assessment - 08/07/16 1101      Breath   Bilateral Breath Sounds Other   Other course crackles in bases bilat   Shortness of Breath Yes;Limiting activity      Exercise Target Goals: Date: 08/11/16  Exercise Program Goal: Individual exercise prescription set with THRR, safety & activity barriers. Participant demonstrates ability to understand and report RPE using BORG scale, to self-measure pulse accurately, and to acknowledge the importance of the exercise prescription.  Exercise Prescription Goal: Starting with aerobic activity 30 plus minutes a day, 3 days per week  for initial exercise prescription. Provide home exercise prescription and guidelines that participant acknowledges understanding prior to discharge.  Activity Barriers & Risk Stratification:     Activity Barriers & Cardiac Risk Stratification - 08/07/16 1100      Activity Barriers & Cardiac Risk Stratification   Activity Barriers Shortness of Breath;Deconditioning      6 Minute Walk:     6 Minute Walk    Row Name 08/11/16 1625         6 Minute Walk   Phase Initial     Distance 600 feet     Walk Time -  4 minutes and 20 seconds total     # of Rest Breaks 2   first rest break 1 minute and 30 seconds --second rest break lasted 10 seconds     RPE 12     Perceived Dyspnea  2     Symptoms Yes (comment)     Comments dizzy when oxygen saturation dropped     Resting HR 82 bpm     Resting BP 95/60     Max Ex. HR 97 bpm     Max Ex. BP 104/63  BP 83/49 at 6 minutes     2 Minute Post BP 91/58       Interval HR   Baseline HR 82     1 Minute HR 89     2 Minute HR 92     3 Minute HR 90     4 Minute HR 94     5 Minute HR 95     6 Minute HR 97     2 Minute Post HR 93     Interval Heart Rate? Yes       Interval Oxygen   Interval Oxygen? Yes     Baseline Oxygen Saturation % 94 %     Baseline Liters of Oxygen 2 L     1 Minute Oxygen Saturation % 85 %     1 Minute Liters of Oxygen 2 L     2 Minute Oxygen Saturation % 80 %  stopped     2 Minute Liters of Oxygen 4 L     3 Minute Oxygen Saturation % 84 %     3 Minute Liters of Oxygen 6 L     4 Minute Oxygen Saturation % 88 %     4 Minute Liters of Oxygen 6 L     5 Minute Oxygen Saturation % 90 %     5 Minute Liters of Oxygen 6 L     6 Minute Oxygen Saturation % 88 %     6 Minute Liters of Oxygen 6 L     2 Minute Post Oxygen Saturation % 88 %     2 Minute Post Liters of Oxygen 6 L        Initial Exercise Prescription:     Initial Exercise Prescription - 08/11/16 1600      Date of Initial Exercise RX and Referring Provider   Date 08/11/16   Referring Provider Dr. Melvyn Novas     Oxygen   Oxygen Continuous   Liters 6     Recumbant Bike   Level 2   Minutes 17     NuStep   Level 2   Minutes 17   METs 1.5     Track   Laps 5   Minutes 17     Prescription Details   Frequency (times per week) 2   Duration  Progress to 45 minutes of aerobic exercise without signs/symptoms of physical distress     Intensity   THRR 40-80% of Max Heartrate 59-118   Ratings of Perceived Exertion 11-13   Perceived Dyspnea 0-4     Progression   Progression Continue progressive overload as per policy  without signs/symptoms or physical distress.     Resistance Training   Training Prescription Yes   Weight green bands   Reps 10-12      Perform Capillary Blood Glucose checks as needed.  Exercise Prescription Changes:   Exercise Comments:   Discharge Exercise Prescription (Final Exercise Prescription Changes):    Nutrition:  Target Goals: Understanding of nutrition guidelines, daily intake of sodium <1573m, cholesterol <2070m calories 30% from fat and 7% or less from saturated fats, daily to have 5 or more servings of fruits and vegetables.  Biometrics:     Pre Biometrics - 08/07/16 1120      Pre Biometrics   Grip Strength 38 kg       Nutrition Therapy Plan and Nutrition Goals:   Nutrition Discharge: Rate Your Plate Scores:   Psychosocial: Target Goals: Acknowledge presence or absence of depression, maximize coping skills, provide positive support system. Participant is able to verbalize types and ability to use techniques and skills needed for reducing stress and depression.  Initial Review & Psychosocial Screening:     Initial Psych Review & Screening - 08/07/16 1103      Family Dynamics   Good Support System? Yes     Barriers   Psychosocial barriers to participate in program There are no identifiable barriers or psychosocial needs.     Screening Interventions   Interventions Encouraged to exercise      Quality of Life Scores:   PHQ-9: Recent Review Flowsheet Data    [facility]   Decreased Interest 0   Down, Depressed, Hopeless 0   PHQ - 2 Score 0      Psychosocial Evaluation and Intervention:     Psychosocial Evaluation - 08/07/16 1103      Psychosocial Evaluation & Interventions   Interventions Encouraged to exercise with the program and follow exercise prescription      Psychosocial Re-Evaluation:  Education: Education Goals: Education classes will be provided on a weekly basis, covering required  topics. Participant will state understanding/return demonstration of topics presented.  Learning Barriers/Preferences:     Learning Barriers/Preferences - 08/07/16 1101      Learning Barriers/Preferences   Learning Barriers None   Learning Preferences Computer/Internet;Group Instruction;Individual Instruction;Written Material;Verbal Instruction      Education Topics: Risk Factor Reduction:  -Group instruction that is supported by a PowerPoint presentation. Instructor discusses the definition of a risk factor, different risk factors for pulmonary disease, and how the heart and lungs work together.     Nutrition for Pulmonary Patient:  -Group instruction provided by PowerPoint slides, verbal discussion, and written materials to support subject matter. The instructor gives an explanation and review of healthy diet recommendations, which includes a discussion on weight management, recommendations for fruit and vegetable consumption, as well as protein, fluid, caffeine, fiber, sodium, sugar, and alcohol. Tips for eating when patients are short of breath are discussed.   Pursed Lip Breathing:  -Group instruction that is supported by demonstration and informational handouts. Instructor discusses the benefits of pursed lip and diaphragmatic breathing and detailed demonstration on how to preform both.     Oxygen Safety:  -Group instruction provided by PowerPoint, verbal discussion, and written  material to support subject matter. There is an overview of "What is Oxygen" and "Why do we need it".  Instructor also reviews how to create a safe environment for oxygen use, the importance of using oxygen as prescribed, and the risks of noncompliance. There is a brief discussion on traveling with oxygen and resources the patient may utilize.   Oxygen Equipment:  -Group instruction provided by [facility] Staff utilizing handouts, written materials, and equipment demonstrations.   Signs and Symptoms:   -Group instruction provided by written material and verbal discussion to support subject matter. Warning signs and symptoms of infection, stroke, and heart attack are reviewed and when to call the physician/911 reinforced. Tips for preventing the spread of infection discussed.   Advanced Directives:  -Group instruction provided by verbal instruction and written material to support subject matter. Instructor reviews Advanced Directive laws and proper instruction for filling out document.   Pulmonary Video:  -Group video education that reviews the importance of medication and oxygen compliance, exercise, good nutrition, pulmonary hygiene, and pursed lip and diaphragmatic breathing for the pulmonary patient.   Exercise for the Pulmonary Patient:  -Group instruction that is supported by a PowerPoint presentation. Instructor discusses benefits of exercise, core components of exercise, frequency, duration, and intensity of an exercise routine, importance of utilizing pulse oximetry during exercise, safety while exercising, and options of places to exercise outside of rehab.     Pulmonary Medications:  -Verbally interactive group education provided by instructor with focus on inhaled medications and proper administration.   Anatomy and Physiology of the Respiratory System and Intimacy:  -Group instruction provided by PowerPoint, verbal discussion, and written material to support subject matter. Instructor reviews respiratory cycle and anatomical components of the respiratory system and their functions. Instructor also reviews differences in obstructive and restrictive respiratory diseases with examples of each. Intimacy, Sex, and Sexuality differences are reviewed with a discussion on how relationships can change when diagnosed with pulmonary disease. Common sexual concerns are reviewed.   Knowledge Questionnaire Score:   Core Components/Risk Factors/Patient Goals at Admission:     Personal  Goals and Risk Factors at Admission - 08/07/16 1102      Core Components/Risk Factors/Patient Goals on Admission   Increase Strength and Stamina Yes   Intervention Provide advice, education, support and counseling about physical activity/exercise needs.;Develop an individualized exercise prescription for aerobic and resistive training based on initial evaluation findings, risk stratification, comorbidities and participant's personal goals.   Expected Outcomes Achievement of increased cardiorespiratory fitness and enhanced flexibility, muscular endurance and strength shown through measurements of functional capacity and personal statement of participant.   Improve shortness of breath with ADL's Yes   Intervention Provide education, individualized exercise plan and daily activity instruction to help decrease symptoms of SOB with activities of daily living.   Expected Outcomes Short Term: Achieves a reduction of symptoms when performing activities of daily living.   Develop more efficient breathing techniques such as purse lipped breathing and diaphragmatic breathing; and practicing self-pacing with activity Yes   Intervention Provide education, demonstration and support about specific breathing techniuqes utilized for more efficient breathing. Include techniques such as pursed lipped breathing, diaphragmatic breathing and self-pacing activity.   Expected Outcomes Short Term: Participant will be able to demonstrate and use breathing techniques as needed throughout daily activities.   Increase knowledge of respiratory medications and ability to use respiratory devices properly  Yes   Intervention Provide education and demonstration as needed of appropriate use of medications, inhalers, and  oxygen therapy.   Expected Outcomes Short Term: Achieves understanding of medications use. Understands that oxygen is a medication prescribed by physician. Demonstrates appropriate use of inhaler and oxygen therapy.       Core Components/Risk Factors/Patient Goals Review:    Core Components/Risk Factors/Patient Goals at Discharge (Final Review):    ITP Comments:   Comments:

## 2016-08-12 ENCOUNTER — Other Ambulatory Visit: Payer: Self-pay | Admitting: Internal Medicine

## 2016-08-12 DIAGNOSIS — J9611 Chronic respiratory failure with hypoxia: Secondary | ICD-10-CM

## 2016-08-12 DIAGNOSIS — R0600 Dyspnea, unspecified: Secondary | ICD-10-CM

## 2016-08-12 DIAGNOSIS — J8410 Pulmonary fibrosis, unspecified: Secondary | ICD-10-CM

## 2016-08-13 ENCOUNTER — Encounter (HOSPITAL_COMMUNITY): Payer: Self-pay | Admitting: *Deleted

## 2016-08-17 ENCOUNTER — Other Ambulatory Visit: Payer: Self-pay | Admitting: Internal Medicine

## 2016-08-18 ENCOUNTER — Encounter (HOSPITAL_COMMUNITY): Payer: Medicare Other

## 2016-08-20 ENCOUNTER — Encounter (HOSPITAL_COMMUNITY)
Admission: RE | Admit: 2016-08-20 | Discharge: 2016-08-20 | Disposition: A | Discharge status: Home / Self Care | Payer: Medicare Other | Source: Ambulatory Visit | Attending: Internal Medicine | Admitting: Internal Medicine

## 2016-08-20 VITALS — Wt 134.9 lb

## 2016-08-20 DIAGNOSIS — R0600 Dyspnea, unspecified: Secondary | ICD-10-CM | POA: Diagnosis not present

## 2016-08-20 DIAGNOSIS — J9611 Chronic respiratory failure with hypoxia: Secondary | ICD-10-CM | POA: Insufficient documentation

## 2016-08-20 DIAGNOSIS — J8410 Pulmonary fibrosis, unspecified: Secondary | ICD-10-CM | POA: Diagnosis not present

## 2016-08-20 NOTE — Progress Notes (Signed)
Daily Session Note  Patient Details  Name: Gilbert Dominguez MRN: [id number] Date of Birth: 1946/07/07 Referring Provider:   April Manson Pulmonary Rehab Walk Test from 08/11/2016 in [place]  Referring Provider  Dr. Melvyn Novas      Encounter Date: 08/20/2016  Check In:     Session Check In - 08/20/16 1330      Check-In   Location [facility]   Staff Present Rosebud Poles, RN, BSN;Molly diVincenzo, MS, ACSM RCEP, Exercise Physiologist;Dalina Samara Ysidro Evert, RN;Portia Rollene Rotunda, RN, BSN   Supervising physician immediately available to respond to emergencies [facility] immediately available   Physician(s) Dr. Elder Love   Medication changes reported     No   Fall or balance concerns reported    No   Warm-up and Cool-down Performed as group-led instruction   Resistance Training Performed Yes   VAD Patient? No     Pain Assessment   Currently in Pain? No/denies   Multiple Pain Sites No      Capillary Blood Glucose: No results found for this or any previous visit (from the past 24 hour(s)).      Exercise Prescription Changes - 08/20/16 1600      Response to Exercise   Blood Pressure (Admit) 114/50   Blood Pressure (Exercise) 120/60   Blood Pressure (Exit) 106/64   Heart Rate (Admit) 73 bpm   Heart Rate (Exercise) 79 bpm   Heart Rate (Exit) 63 bpm   Oxygen Saturation (Admit) 89 %   Oxygen Saturation (Exercise) 86 %  O2 increased to 8L with exercise sat improved to 93.   Oxygen Saturation (Exit) 98 %   Rating of Perceived Exertion (Exercise) 13   Perceived Dyspnea (Exercise) 3   Duration Progress to 45 minutes of aerobic exercise without signs/symptoms of physical distress   Intensity Other (comment)  40-80% of HRR     Progression   Progression Continue to progress workloads to maintain intensity without signs/symptoms of physical distress.     Resistance Training   Training Prescription Yes   Weight green bands   Reps 10-12  10  minutes of strength training     Interval Training   Interval Training No     Oxygen   Oxygen Continuous   Liters 8     NuStep   Level 2   Minutes 17   METs 2.3     Track   Laps 11   Minutes 17     Goals Met:  Exercise tolerated well No report of cardiac concerns or symptoms Strength training completed today  Goals Unmet:  Not Applicable  Comments: Service time is from 1330 to 1540     Dr. Rush Farmer is Medical Director for Pulmonary Rehab at [facility].

## 2016-08-25 ENCOUNTER — Telehealth: Payer: Self-pay | Admitting: Internal Medicine

## 2016-08-25 ENCOUNTER — Encounter (HOSPITAL_COMMUNITY)
Admission: RE | Admit: 2016-08-25 | Discharge: 2016-08-25 | Disposition: A | Discharge status: Home / Self Care | Payer: Medicare Other | Source: Ambulatory Visit | Attending: Internal Medicine | Admitting: Internal Medicine

## 2016-08-25 VITALS — Wt 132.3 lb

## 2016-08-25 DIAGNOSIS — J9611 Chronic respiratory failure with hypoxia: Secondary | ICD-10-CM | POA: Diagnosis not present

## 2016-08-25 DIAGNOSIS — J8410 Pulmonary fibrosis, unspecified: Secondary | ICD-10-CM

## 2016-08-25 NOTE — Progress Notes (Signed)
Pulmonary Individual Treatment Plan  Patient Details  Name: Gilbert Dominguez MRN: [id number] Date of Birth: [birth year] Referring Provider:   April Manson Pulmonary Rehab Walk Test from 08/11/2016 in [place]  Referring Provider  Dr. Melvyn Novas      Initial Encounter Date:  Flowsheet Row Pulmonary Rehab Walk Test from 08/11/2016 in [place]  Date  08/11/16  Referring Provider  Dr. 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  alendronate (FOSAMAX) 70 MG tablet, Take 70 mg by mouth once a week. Take with a full glass of water on an empty stomach., Disp: , Rfl:  .  aspirin 81 MG tablet, Take 81 mg by mouth daily., Disp: , Rfl:  .  Calcium Carbonate-Vit D-Min (CALCIUM 600+D PLUS MINERALS PO), Take 1 tablet by mouth 2 (two) times daily., Disp: , Rfl:  .  famotidine (PEPCID) 20 MG tablet, TAKE 1 TABLET BY MOUTH AT BEDTIME, Disp: 30 tablet, Rfl: 2 .  furosemide (LASIX) 20 MG tablet, Take 20 mg by mouth every other day., Disp: , Rfl:  .  loratadine (CLARITIN) 10 MG tablet, Take 10 mg by mouth daily., Disp: , Rfl:  .  OXYGEN, 2 lpm with sleep and as needed with exertion, Disp: , Rfl:  .  pantoprazole (PROTONIX) 40 MG tablet, TAKE 1 TABLET BY MOUTH EVERY DAY *TAKE 30-60 MINUTES BEFORE FIRST MEAL OF THE DAY*, Disp: 30 tablet, Rfl: 11 .  predniSONE (DELTASONE) 10 MG tablet, TAKE 2 DAILY UNTIL BETTER THEN ONE DAILY UNTIL RETURN (Patient taking differently: TAKE 2 DAILY UNTIL BETTER THEN ONE DAILY), Disp: 60 tablet, Rfl: 2 .  Pseudoephedrine-Guaifenesin (MUCINEX D MAX STRENGTH) [phone] MG TB12, Take by mouth., Disp: , Rfl:  .  tadalafil (CIALIS) 20 MG tablet, Take 20 mg by mouth daily as needed for erectile dysfunction., Disp: , Rfl:  .  terazosin (HYTRIN) 1 MG capsule, Take 2 mg by mouth at bedtime. , Disp: , Rfl:  .   valsartan-hydrochlorothiazide (DIOVAN HCT) 160-12.5 MG tablet, Take 1 tablet by mouth daily. (Patient taking differently: Take 0.5 tablets by mouth daily. ), Disp: 30 tablet, Rfl: 11  Past Medical History: Past Medical History:  Diagnosis Date  . Chronic respiratory failure ([place])   . Hypertension   . 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 Rheumatoid arthritis(714.0)     Tobacco Use: History  Smoking Status  . Former Smoker  . Packs/day: 1.00  . Years: 36.00  . Types: Cigarettes  . Quit date: 10/14/1995  Smokeless Tobacco  . Not on file    Labs: Recent Review Flowsheet Data    There is no flowsheet data to display.      Capillary Blood Glucose: No results found for: GLUCAP   ADL UCSD:     Pulmonary Assessment Scores    Row Name 08/13/16 1115         ADL UCSD   ADL Phase Entry     SOB Score total 65        Pulmonary Function Assessment:     Pulmonary Function Assessment - 08/07/16 1101      Breath   Bilateral Breath Sounds Other   Other course crackles in bases bilat   Shortness of Breath Yes;Limiting activity      Exercise Target Goals:    Exercise Program Goal: Individual exercise prescription set with THRR, safety &  activity barriers. Participant demonstrates ability to understand and report RPE using BORG scale, to self-measure pulse accurately, and to acknowledge the importance of the exercise prescription.  Exercise Prescription Goal: Starting with aerobic activity 30 plus minutes a day, 3 days per week for initial exercise prescription. Provide home exercise prescription and guidelines that participant acknowledges understanding prior to discharge.  Activity Barriers & Risk Stratification:     Activity Barriers & Cardiac Risk Stratification - 08/07/16 1100      Activity Barriers & Cardiac Risk Stratification   Activity Barriers Shortness of Breath;Deconditioning      6 Minute Walk:     6 Minute Walk    Row Name 08/11/16  1625         6 Minute Walk   Phase Initial     Distance 600 feet     Walk Time -  4 minutes and 20 seconds total     # of Rest Breaks 2  first rest break 1 minute and 30 seconds --second rest break lasted 10 seconds     RPE 12     Perceived Dyspnea  2     Symptoms Yes (comment)     Comments dizzy when oxygen saturation dropped     Resting HR 82 bpm     Resting BP 95/60     Max Ex. HR 97 bpm     Max Ex. BP 104/63  BP 83/49 at 6 minutes     2 Minute Post BP 91/58       Interval HR   Baseline HR 82     1 Minute HR 89     2 Minute HR 92     3 Minute HR 90     4 Minute HR 94     5 Minute HR 95     6 Minute HR 97     2 Minute Post HR 93     Interval Heart Rate? Yes       Interval Oxygen   Interval Oxygen? Yes     Baseline Oxygen Saturation % 94 %     Baseline Liters of Oxygen 2 L     1 Minute Oxygen Saturation % 85 %     1 Minute Liters of Oxygen 2 L     2 Minute Oxygen Saturation % 80 %  stopped     2 Minute Liters of Oxygen 4 L     3 Minute Oxygen Saturation % 84 %     3 Minute Liters of Oxygen 6 L     4 Minute Oxygen Saturation % 88 %     4 Minute Liters of Oxygen 6 L     5 Minute Oxygen Saturation % 90 %     5 Minute Liters of Oxygen 6 L     6 Minute Oxygen Saturation % 88 %     6 Minute Liters of Oxygen 6 L     2 Minute Post Oxygen Saturation % 88 %     2 Minute Post Liters of Oxygen 6 L        Initial Exercise Prescription:     Initial Exercise Prescription - 08/11/16 1600      Date of Initial Exercise RX and Referring Provider   Date 08/11/16   Referring Provider Dr. Melvyn Novas     Oxygen   Oxygen Continuous   Liters 6     Recumbant Bike   Level 2   Minutes 17  NuStep   Level 2   Minutes 17   METs 1.5     Track   Laps 5   Minutes 17     Prescription Details   Frequency (times per week) 2   Duration Progress to 45 minutes of aerobic exercise without signs/symptoms of physical distress     Intensity   THRR 40-80% of Max Heartrate  59-118   Ratings of Perceived Exertion 11-13   Perceived Dyspnea 0-4     Progression   Progression Continue progressive overload as per policy without signs/symptoms or physical distress.     Resistance Training   Training Prescription Yes   Weight green bands   Reps 10-12      Perform Capillary Blood Glucose checks as needed.  Exercise Prescription Changes:     Exercise Prescription Changes    Row Name 08/20/16 1600             Response to Exercise   Blood Pressure (Admit) 114/50       Blood Pressure (Exercise) 120/60       Blood Pressure (Exit) 106/64       Heart Rate (Admit) 73 bpm       Heart Rate (Exercise) 79 bpm       Heart Rate (Exit) 63 bpm       Oxygen Saturation (Admit) 89 %       Oxygen Saturation (Exercise) 86 %  O2 increased to 8L with exercise sat improved to 93.       Oxygen Saturation (Exit) 98 %       Rating of Perceived Exertion (Exercise) 13       Perceived Dyspnea (Exercise) 3       Duration Progress to 45 minutes of aerobic exercise without signs/symptoms of physical distress       Intensity Other (comment)  40-80% of HRR         Progression   Progression Continue to progress workloads to maintain intensity without signs/symptoms of physical distress.         Resistance Training   Training Prescription Yes       Weight green bands       Reps 10-12  10 minutes of strength training         Interval Training   Interval Training No         Oxygen   Oxygen Continuous       Liters 8         NuStep   Level 2       Minutes 17       METs 2.3         Track   Laps 11       Minutes 17          Exercise Comments:     Exercise Comments    Row Name 08/24/16 1025           Exercise Comments Patient has only attended one session. Will cont. to monitor.          Discharge Exercise Prescription (Final Exercise Prescription Changes):     Exercise Prescription Changes - 08/20/16 1600      Response to Exercise   Blood Pressure  (Admit) 114/50   Blood Pressure (Exercise) 120/60   Blood Pressure (Exit) 106/64   Heart Rate (Admit) 73 bpm   Heart Rate (Exercise) 79 bpm   Heart Rate (Exit) 63 bpm   Oxygen Saturation (Admit) 89 %   Oxygen Saturation (  Exercise) 86 %  O2 increased to 8L with exercise sat improved to 93.   Oxygen Saturation (Exit) 98 %   Rating of Perceived Exertion (Exercise) 13   Perceived Dyspnea (Exercise) 3   Duration Progress to 45 minutes of aerobic exercise without signs/symptoms of physical distress   Intensity Other (comment)  40-80% of HRR     Progression   Progression Continue to progress workloads to maintain intensity without signs/symptoms of physical distress.     Resistance Training   Training Prescription Yes   Weight green bands   Reps 10-12  10 minutes of strength training     Interval Training   Interval Training No     Oxygen   Oxygen Continuous   Liters 8     NuStep   Level 2   Minutes 17   METs 2.3     Track   Laps 11   Minutes 17       Nutrition:  Target Goals: Understanding of nutrition guidelines, daily intake of sodium <1512m, cholesterol <2068m calories 30% from fat and 7% or less from saturated fats, daily to have 5 or more servings of fruits and vegetables.  Biometrics:     Pre Biometrics - 08/07/16 1120      Pre Biometrics   Grip Strength 38 kg       Nutrition Therapy Plan and Nutrition Goals:   Nutrition Discharge: Rate Your Plate Scores:   Psychosocial: Target Goals: Acknowledge presence or absence of depression, maximize coping skills, provide positive support system. Participant is able to verbalize types and ability to use techniques and skills needed for reducing stress and depression.  Initial Review & Psychosocial Screening:     Initial Psych Review & Screening - 08/07/16 1103      Family Dynamics   Good Support System? Yes     Barriers   Psychosocial barriers to participate in program There are no identifiable  barriers or psychosocial needs.     Screening Interventions   Interventions Encouraged to exercise      Quality of Life Scores:     Quality of Life - 08/13/16 1116      Quality of Life Scores   Health/Function Pre 28.8 %   Socioeconomic Pre 29.69 %   Psych/Spiritual Pre 27.5 %   Family Pre 28.13 %   GLOBAL Pre 28.66 %      PHQ-9: Recent Review Flowsheet Data    [facility]   Decreased Interest 0   Down, Depressed, Hopeless 0   PHQ - 2 Score 0      Psychosocial Evaluation and Intervention:     Psychosocial Evaluation - 08/07/16 1103      Psychosocial Evaluation & Interventions   Interventions Encouraged to exercise with the program and follow exercise prescription      Psychosocial Re-Evaluation:     Psychosocial Re-Evaluation    [employer] 08/24/16 1438             Psychosocial Re-Evaluation   Interventions Encouraged to attend Pulmonary Rehabilitation for the exercise       Comments no psychosocial issues identified at this time       Continued Psychosocial Services Needed No         Education: Education Goals: Education classes will be provided on a weekly basis, covering required topics. Participant will state understanding/return demonstration of topics presented.  Learning Barriers/Preferences:     Learning Barriers/Preferences - 08/07/16 1101  Learning Barriers/Preferences   Learning Barriers None   Learning Preferences Computer/Internet;Group Instruction;Individual Instruction;Written Material;Verbal Instruction      Education Topics: Risk Factor Reduction:  -Group instruction that is supported by a PowerPoint presentation. Instructor discusses the definition of a risk factor, different risk factors for pulmonary disease, and how the heart and lungs work together.     Nutrition for Pulmonary Patient:  -Group instruction provided by PowerPoint slides, verbal discussion, and written materials to support  subject matter. The instructor gives an explanation and review of healthy diet recommendations, which includes a discussion on weight management, recommendations for fruit and vegetable consumption, as well as protein, fluid, caffeine, fiber, sodium, sugar, and alcohol. Tips for eating when patients are short of breath are discussed.   Pursed Lip Breathing:  -Group instruction that is supported by demonstration and informational handouts. Instructor discusses the benefits of pursed lip and diaphragmatic breathing and detailed demonstration on how to preform both.     Oxygen Safety:  -Group instruction provided by PowerPoint, verbal discussion, and written material to support subject matter. There is an overview of "What is Oxygen" and "Why do we need it".  Instructor also reviews how to create a safe environment for oxygen use, the importance of using oxygen as prescribed, and the risks of noncompliance. There is a brief discussion on traveling with oxygen and resources the patient may utilize. Flowsheet Row PULMONARY REHAB OTHER RESPIRATORY from 08/20/2016 in [place]  Date  08/20/16  Educator  rn  Instruction Review Code  2- meets goals/outcomes      Oxygen Equipment:  -Group instruction provided by [employer] Staff utilizing handouts, written materials, and equipment demonstrations.   Signs and Symptoms:  -Group instruction provided by written material and verbal discussion to support subject matter. Warning signs and symptoms of infection, stroke, and heart attack are reviewed and when to call the physician/911 reinforced. Tips for preventing the spread of infection discussed.   Advanced Directives:  -Group instruction provided by verbal instruction and written material to support subject matter. Instructor reviews Advanced Directive laws and proper instruction for filling out document.   Pulmonary Video:  -Group video education that reviews the  importance of medication and oxygen compliance, exercise, good nutrition, pulmonary hygiene, and pursed lip and diaphragmatic breathing for the pulmonary patient.   Exercise for the Pulmonary Patient:  -Group instruction that is supported by a PowerPoint presentation. Instructor discusses benefits of exercise, core components of exercise, frequency, duration, and intensity of an exercise routine, importance of utilizing pulse oximetry during exercise, safety while exercising, and options of places to exercise outside of rehab.     Pulmonary Medications:  -Verbally interactive group education provided by instructor with focus on inhaled medications and proper administration.   Anatomy and Physiology of the Respiratory System and Intimacy:  -Group instruction provided by PowerPoint, verbal discussion, and written material to support subject matter. Instructor reviews respiratory cycle and anatomical components of the respiratory system and their functions. Instructor also reviews differences in obstructive and restrictive respiratory diseases with examples of each. Intimacy, Sex, and Sexuality differences are reviewed with a discussion on how relationships can change when diagnosed with pulmonary disease. Common sexual concerns are reviewed.   Knowledge Questionnaire Score:     Knowledge Questionnaire Score - 08/13/16 1115      Knowledge Questionnaire Score   Pre Score 9/13      Core Components/Risk Factors/Patient Goals at Admission:     Personal Goals and  Risk Factors at Admission - 08/07/16 1102      Core Components/Risk Factors/Patient Goals on Admission   Increase Strength and Stamina Yes   Intervention Provide advice, education, support and counseling about physical activity/exercise needs.;Develop an individualized exercise prescription for aerobic and resistive training based on initial evaluation findings, risk stratification, comorbidities and participant's personal goals.    Expected Outcomes Achievement of increased cardiorespiratory fitness and enhanced flexibility, muscular endurance and strength shown through measurements of functional capacity and personal statement of participant.   Improve shortness of breath with ADL's Yes   Intervention Provide education, individualized exercise plan and daily activity instruction to help decrease symptoms of SOB with activities of daily living.   Expected Outcomes Short Term: Achieves a reduction of symptoms when performing activities of daily living.   Develop more efficient breathing techniques such as purse lipped breathing and diaphragmatic breathing; and practicing self-pacing with activity Yes   Intervention Provide education, demonstration and support about specific breathing techniuqes utilized for more efficient breathing. Include techniques such as pursed lipped breathing, diaphragmatic breathing and self-pacing activity.   Expected Outcomes Short Term: Participant will be able to demonstrate and use breathing techniques as needed throughout daily activities.   Increase knowledge of respiratory medications and ability to use respiratory devices properly  Yes   Intervention Provide education and demonstration as needed of appropriate use of medications, inhalers, and oxygen therapy.   Expected Outcomes Short Term: Achieves understanding of medications use. Understands that oxygen is a medication prescribed by physician. Demonstrates appropriate use of inhaler and oxygen therapy.      Core Components/Risk Factors/Patient Goals Review:      Goals and Risk Factor Review    Row Name 08/24/16 1436             Core Components/Risk Factors/Patient Goals Review   Personal Goals Review Increase Strength and Stamina;Develop more efficient breathing techniques such as purse lipped breathing and diaphragmatic breathing and practicing self-pacing with activity.;Improve shortness of breath with ADL's;Increase knowledge of  respiratory medications and ability to use respiratory devices properly.       Review Has only attended 1 exercise session, too early to have met any goals       Expected Outcomes expect imrovement in goals in the next 30 days          Core Components/Risk Factors/Patient Goals at Discharge (Final Review):      Goals and Risk Factor Review - 08/24/16 1436      Core Components/Risk Factors/Patient Goals Review   Personal Goals Review Increase Strength and Stamina;Develop more efficient breathing techniques such as purse lipped breathing and diaphragmatic breathing and practicing self-pacing with activity.;Improve shortness of breath with ADL's;Increase knowledge of respiratory medications and ability to use respiratory devices properly.   Review Has only attended 1 exercise session, too early to have met any goals   Expected Outcomes expect imrovement in goals in the next 30 days      ITP Comments:   Comments: ITP REVIEW Pt is making expected progress toward pulmonary rehab goals after completing 1 sessions. Recommend continued exercise, life style modification, education, and utilization of breathing techniques to increase stamina and strength and decrease shortness of breath with exertion.

## 2016-08-25 NOTE — Progress Notes (Signed)
Daily Session Note  Patient Details  Name: Gilbert Dominguez MRN: [id number] Date of Birth: 1946/08/13 Referring Provider:   April Manson Pulmonary Rehab Walk Test from 08/11/2016 in [place]  Referring Provider  Dr. Melvyn Novas      Encounter Date: 08/25/2016  Check In:     Session Check In - 08/25/16 1530      Check-In   Location [facility]   Staff Present Rosebud Poles, RN, BSN;Molly diVincenzo, MS, ACSM RCEP, Exercise Physiologist;Annedrea Rosezella [place], RN, MHA;Lisa Ysidro Evert, RN;Portia Rollene Rotunda, RN, BSN   Supervising physician immediately available to respond to emergencies [facility] immediately available   Physician(s) Dr. Lonny Prude   Medication changes reported     No   Fall or balance concerns reported    No   Warm-up and Cool-down Performed as group-led instruction   Resistance Training Performed Yes   VAD Patient? No     Pain Assessment   Currently in Pain? No/denies   Multiple Pain Sites No      Capillary Blood Glucose: No results found for this or any previous visit (from the past 24 hour(s)).      Exercise Prescription Changes - 08/25/16 1500      Exercise Review   Progression Yes     Response to Exercise   Blood Pressure (Admit) 110/46   Blood Pressure (Exercise) 104/60   Blood Pressure (Exit) 106/64   Heart Rate (Admit) 81 bpm   Heart Rate (Exercise) 103 bpm   Heart Rate (Exit) 92 bpm   Oxygen Saturation (Admit) 92 %   Oxygen Saturation (Exercise) 91 %   Oxygen Saturation (Exit) 100 %   Rating of Perceived Exertion (Exercise) 13   Perceived Dyspnea (Exercise) 3   Duration Progress to 45 minutes of aerobic exercise without signs/symptoms of physical distress   Intensity THRR unchanged     Progression   Progression Continue to progress workloads to maintain intensity without signs/symptoms of physical distress.     Resistance Training   Training Prescription Yes   Weight green bands   Reps 10-12  10  minutes of strength training     Interval Training   Interval Training No     Oxygen   Oxygen Continuous   Liters 8     Recumbant Bike   Level 2   Minutes 17     NuStep   Level 3   Minutes 17     Track   Laps 7   Minutes 17     Goals Met:  Exercise tolerated well Strength training completed today  Goals Unmet:  Not Applicable  Comments: Service time is from 1330 to 1510    Dr. Rush Farmer is Medical Director for Pulmonary Rehab at [facility].

## 2016-08-25 NOTE — Telephone Encounter (Signed)
Pulmonary rehab is currently closed. Will call back on 08/26/16.

## 2016-08-26 NOTE — Telephone Encounter (Signed)
Called and LMTCB for [employer] rehab staff not there on Wed

## 2016-08-26 NOTE — Telephone Encounter (Signed)
Attempted to contact Pulmonary Rehab. No answer, no option to leave a message. Will try back.

## 2016-08-27 ENCOUNTER — Encounter (HOSPITAL_COMMUNITY)
Admission: RE | Admit: 2016-08-27 | Discharge: 2016-08-27 | Disposition: A | Discharge status: Home / Self Care | Payer: Medicare Other | Source: Ambulatory Visit | Attending: Internal Medicine | Admitting: Internal Medicine

## 2016-08-27 VITALS — Wt 134.3 lb

## 2016-08-27 DIAGNOSIS — J8410 Pulmonary fibrosis, unspecified: Secondary | ICD-10-CM

## 2016-08-27 DIAGNOSIS — J9611 Chronic respiratory failure with hypoxia: Secondary | ICD-10-CM | POA: Diagnosis not present

## 2016-08-27 NOTE — Progress Notes (Signed)
Daily Session Note  Patient Details  Name: Gilbert Dominguez MRN: [id number] Date of Birth: 01/31/1946 Referring Provider:   April Manson Pulmonary Rehab Walk Test from 08/11/2016 in [place]  Referring Provider  Dr. Melvyn Novas      Encounter Date: 08/27/2016  Check In:     Session Check In - 08/27/16 1330      Check-In   Location [facility]   Staff Present Rosebud Poles, RN, BSN;Molly diVincenzo, MS, ACSM RCEP, Exercise Physiologist;Kaylor Maiers Ysidro Evert, Felipe Drone, RN, MHA;Portia Rollene Rotunda, RN, BSN   Supervising physician immediately available to respond to emergencies [facility] immediately available   Physician(s) Dr. Tana Coast   Medication changes reported     No   Fall or balance concerns reported    No   Warm-up and Cool-down Performed as group-led instruction   Resistance Training Performed Yes   VAD Patient? No     Pain Assessment   Currently in Pain? No/denies   Multiple Pain Sites No      Capillary Blood Glucose: No results found for this or any previous visit (from the past 24 hour(s)).      Exercise Prescription Changes - 08/27/16 1600      Response to Exercise   Blood Pressure (Admit) 90/40   Blood Pressure (Exercise) 130/70   Blood Pressure (Exit) 104/62   Heart Rate (Admit) 84 bpm   Heart Rate (Exercise) 102 bpm   Heart Rate (Exit) 70 bpm   Oxygen Saturation (Admit) 91 %   Oxygen Saturation (Exercise) 90 %   Oxygen Saturation (Exit) 100 %   Rating of Perceived Exertion (Exercise) 15   Perceived Dyspnea (Exercise) 3   Duration Progress to 45 minutes of aerobic exercise without signs/symptoms of physical distress   Intensity THRR unchanged     Progression   Progression Continue to progress workloads to maintain intensity without signs/symptoms of physical distress.     Resistance Training   Training Prescription Yes   Weight green bands   Reps 10-12  10 minutes of strength training     Interval  Training   Interval Training No     Oxygen   Oxygen Continuous   Liters 8     Track   Laps 21   Minutes 34     Goals Met:  Exercise tolerated well No report of cardiac concerns or symptoms Strength training completed today  Goals Unmet:  Not Applicable  Comments: Service time is from 1330 to 1545     Dr. Rush Farmer is Medical Director for Pulmonary Rehab at [facility].

## 2016-08-28 NOTE — Telephone Encounter (Signed)
lmtcb x1 for pulmonary rehab.

## 2016-08-31 NOTE — Telephone Encounter (Signed)
Spoke with Cloyde Reams at Pulmonary Rehab. States that she sent a message to MW about this pt. She needs the pt to put on an oxymizer while he is at pulmonary rehab. Per our records, this order has already been placed. Cloyde Reams is aware and will check with the pt tomorrow to make sure he received this. Nothing further was needed at this time.

## 2016-09-01 ENCOUNTER — Encounter (HOSPITAL_COMMUNITY)
Admission: RE | Admit: 2016-09-01 | Discharge: 2016-09-01 | Disposition: A | Discharge status: Home / Self Care | Payer: Medicare Other | Source: Ambulatory Visit | Attending: Internal Medicine | Admitting: Internal Medicine

## 2016-09-01 VITALS — BP 100/50 | HR 89 | Wt 133.2 lb

## 2016-09-01 DIAGNOSIS — J8410 Pulmonary fibrosis, unspecified: Secondary | ICD-10-CM

## 2016-09-01 DIAGNOSIS — J9611 Chronic respiratory failure with hypoxia: Secondary | ICD-10-CM | POA: Diagnosis not present

## 2016-09-01 NOTE — Progress Notes (Signed)
Daily Session Note  Patient Details  Name: Gilbert Dominguez MRN: [id number] Date of Birth: 19-Jun-1946 Referring Provider:   April Manson Pulmonary Rehab Walk Test from 08/11/2016 in [place]  Referring Provider  Dr. Melvyn Novas      Encounter Date: 09/01/2016  Check In:     Session Check In - 09/01/16 1403      Check-In   Location [facility]   Staff Present Rosebud Poles, RN, BSN;Lisa Ysidro Evert, RN;Molly diVincenzo, MS, ACSM RCEP, Exercise Physiologist;Lyndel Sarate Celesta Aver, MS, ACSM CEP, Exercise Physiologist   Supervising physician immediately available to respond to emergencies [facility] immediately available   Physician(s) Dr. Tana Coast   Medication changes reported     No   Fall or balance concerns reported    No   Warm-up and Cool-down Performed as group-led instruction   Resistance Training Performed Yes   VAD Patient? No     Pain Assessment   Currently in Pain? No/denies   Multiple Pain Sites No      Capillary Blood Glucose: No results found for this or any previous visit (from the past 24 hour(s)).      Exercise Prescription Changes - 09/01/16 1500      Exercise Review   Progression Yes     Response to Exercise   Blood Pressure (Admit) 100/50   Blood Pressure (Exercise) 98/50   Blood Pressure (Exit) 100/60   Heart Rate (Admit) 89 bpm   Heart Rate (Exercise) 97 bpm   Heart Rate (Exit) 75 bpm   Oxygen Saturation (Admit) 98 %   Oxygen Saturation (Exercise) 90 %   Oxygen Saturation (Exit) 95 %   Rating of Perceived Exertion (Exercise) 14   Perceived Dyspnea (Exercise) 3   Duration Progress to 45 minutes of aerobic exercise without signs/symptoms of physical distress   Intensity THRR unchanged     Progression   Progression Continue to progress workloads to maintain intensity without signs/symptoms of physical distress.     Resistance Training   Training Prescription Yes   Weight green bands   Reps 10-12  10  minutes of strength training     Interval Training   Interval Training No     Oxygen   Oxygen Continuous   Liters 8     Recumbant Bike   Level 3   Minutes 17     NuStep   Level 4   Minutes 17   METs 2.3     Track   Laps 2   Minutes 17     Goals Met:  Exercise tolerated well No report of cardiac concerns or symptoms Strength training completed today  Goals Unmet:  Not Applicable  Comments: Service time is from 1400 to 1500    Dr. Rush Farmer is Medical Director for Pulmonary Rehab at [facility].

## 2016-09-03 ENCOUNTER — Encounter (HOSPITAL_COMMUNITY)
Admission: RE | Admit: 2016-09-03 | Discharge: 2016-09-03 | Disposition: A | Discharge status: Home / Self Care | Payer: Medicare Other | Source: Ambulatory Visit | Attending: Internal Medicine | Admitting: Internal Medicine

## 2016-09-03 VITALS — Wt 132.9 lb

## 2016-09-03 DIAGNOSIS — J8410 Pulmonary fibrosis, unspecified: Secondary | ICD-10-CM

## 2016-09-03 DIAGNOSIS — J9611 Chronic respiratory failure with hypoxia: Secondary | ICD-10-CM | POA: Diagnosis not present

## 2016-09-03 NOTE — Progress Notes (Signed)
Daily Session Note  Patient Details  Name: Gilbert Dominguez MRN: [id number] Date of Birth: Sep 09, 1946 Referring Provider:   April Manson [facility]monary Rehab Walk Test from 08/11/2016 in [place]  Referring Provider  Dr. Melvyn Novas      Encounter Date: 09/03/2016  Check In:     Session Check In - 09/03/16 1330      Check-In   Location [facility]   Staff Present Rosebud Poles, RN, BSN;Molly diVincenzo, MS, ACSM RCEP, Exercise Physiologist;Adelayde Minney Ysidro Evert, RN;Portia Rollene Rotunda, RN, BSN   Supervising physician immediately available to respond to emergencies [facility] immediately available   Physician(s) Dr. Grandville Silos   Medication changes reported     No   Fall or balance concerns reported    No   Warm-up and Cool-down Performed as group-led instruction   Resistance Training Performed Yes   VAD Patient? No     Pain Assessment   Currently in Pain? No/denies   Multiple Pain Sites No      Capillary Blood Glucose: No results found for this or any previous visit (from the past 24 hour(s)).      Exercise Prescription Changes - 09/03/16 1500      Response to Exercise   Blood Pressure (Admit) 106/60   Blood Pressure (Exercise) 96/64   Blood Pressure (Exit) 98/64   Heart Rate (Admit) 70 bpm   Heart Rate (Exercise) 72 bpm   Heart Rate (Exit) 61 bpm   Oxygen Saturation (Admit) 98 %   Oxygen Saturation (Exercise) 94 %   Oxygen Saturation (Exit) 94 %   Rating of Perceived Exertion (Exercise) 15   Perceived Dyspnea (Exercise) 2   Duration Progress to 45 minutes of aerobic exercise without signs/symptoms of physical distress   Intensity THRR unchanged     Progression   Progression Continue to progress workloads to maintain intensity without signs/symptoms of physical distress.     Resistance Training   Training Prescription Yes   Weight green bands   Reps 10-12  10 minutes of strength training     Interval Training   Interval  Training No     Oxygen   Oxygen Continuous   Liters 8     Recumbant Bike   Level 3   Minutes 17     NuStep   Level 4   Minutes 17   METs 1.8     Goals Met:  Exercise tolerated well No report of cardiac concerns or symptoms Strength training completed today  Goals Unmet:  Not Applicable  Comments: Service time is from 1330 to 1530    Dr. Rush Farmer is Medical Director for Pulmonary Rehab at [facility].

## 2016-09-08 ENCOUNTER — Encounter (HOSPITAL_COMMUNITY)
Admission: RE | Admit: 2016-09-08 | Discharge: 2016-09-08 | Disposition: A | Discharge status: Home / Self Care | Payer: Medicare Other | Source: Ambulatory Visit | Attending: Internal Medicine | Admitting: Internal Medicine

## 2016-09-08 ENCOUNTER — Encounter (HOSPITAL_COMMUNITY): Payer: Medicare Other

## 2016-09-08 VITALS — Wt 135.6 lb

## 2016-09-08 DIAGNOSIS — J9611 Chronic respiratory failure with hypoxia: Secondary | ICD-10-CM | POA: Diagnosis not present

## 2016-09-08 DIAGNOSIS — J8410 Pulmonary fibrosis, unspecified: Secondary | ICD-10-CM

## 2016-09-08 NOTE — Progress Notes (Signed)
Daily Session Note  Patient Details  Name: Gilbert Dominguez MRN: [id number] Date of Birth: 08/06/1946 Referring Provider:   April Manson Pulmonary Rehab Walk Test from 08/11/2016 in [place]  Referring Provider  Dr. Melvyn Novas      Encounter Date: 09/08/2016  Check In:     Session Check In - 09/08/16 1330      Check-In   Location [facility]   Staff Present Rosebud Poles, RN, BSN;Molly diVincenzo, MS, ACSM RCEP, Exercise Physiologist;Lisa Ysidro Evert, RN;Ekam Bonebrake Rollene Rotunda, RN, BSN   Supervising physician immediately available to respond to emergencies [facility] immediately available   Physician(s) Dr. Grandville Silos   Medication changes reported     No   Fall or balance concerns reported    No   Warm-up and Cool-down Performed as group-led instruction   VAD Patient? No     Pain Assessment   Currently in Pain? No/denies   Multiple Pain Sites No      Capillary Blood Glucose: No results found for this or any previous visit (from the past 24 hour(s)).      Exercise Prescription Changes - 09/08/16 1552      Exercise Review   Progression Yes     Response to Exercise   Blood Pressure (Admit) 98/50   Blood Pressure (Exercise) 99/69   Blood Pressure (Exit) 100/60   Heart Rate (Admit) 85 bpm   Heart Rate (Exercise) 91 bpm   Heart Rate (Exit) 77 bpm   Oxygen Saturation (Admit) 91 %   Oxygen Saturation (Exercise) 88 %   Oxygen Saturation (Exit) 100 %   Rating of Perceived Exertion (Exercise) 13   Perceived Dyspnea (Exercise) 1   Duration Progress to 45 minutes of aerobic exercise without signs/symptoms of physical distress   Intensity THRR unchanged     Progression   Progression Continue to progress workloads to maintain intensity without signs/symptoms of physical distress.     Resistance Training   Training Prescription Yes   Weight green bands   Reps 10-12  10 minutes of strength training     Interval Training   Interval  Training No     Oxygen   Oxygen Continuous   Liters 8     Recumbant Bike   Level 4   Minutes 17     NuStep   Level 4   Minutes 17   METs 2.2     Track   Laps 7   Minutes 17     Goals Met:  Using PLB without cueing & demonstrates good technique Exercise tolerated well No report of cardiac concerns or symptoms Strength training completed today  Goals Unmet:  Not Applicable  Comments: Service time is from 1330 to 1500   Dr. Rush Farmer is Medical Director for Pulmonary Rehab at [facility].

## 2016-09-10 ENCOUNTER — Encounter (HOSPITAL_COMMUNITY): Payer: Medicare Other

## 2016-09-14 ENCOUNTER — Telehealth: Payer: Self-pay | Admitting: Internal Medicine

## 2016-09-14 NOTE — Telephone Encounter (Signed)
Spoke with Gilbert Dominguez and informed her that the order was sent in on 08/12/16 to [facility]. I did give her the phone number to their office so that they can follow up. Nothing at this time is needed.

## 2016-09-15 ENCOUNTER — Encounter (HOSPITAL_COMMUNITY)
Admission: RE | Admit: 2016-09-15 | Discharge: 2016-09-15 | Disposition: A | Discharge status: Home / Self Care | Payer: Medicare Other | Source: Ambulatory Visit | Attending: Internal Medicine | Admitting: Internal Medicine

## 2016-09-15 VITALS — Wt 131.0 lb

## 2016-09-15 DIAGNOSIS — J8410 Pulmonary fibrosis, unspecified: Secondary | ICD-10-CM

## 2016-09-15 DIAGNOSIS — J9611 Chronic respiratory failure with hypoxia: Secondary | ICD-10-CM | POA: Diagnosis not present

## 2016-09-15 NOTE — Progress Notes (Signed)
Daily Session Note  Patient Details  Name: Gilbert Dominguez MRN: [id number] Date of Birth: 1946/08/03 Referring Provider:   April Manson Pulmonary Rehab Walk Test from 08/11/2016 in [place]  Referring Provider  Dr. Melvyn Novas      Encounter Date: 09/15/2016  Check In:     Session Check In - 09/15/16 1330      Check-In   Location [facility]   Staff Present Rosebud Poles, RN, BSN;Molly diVincenzo, MS, ACSM RCEP, Exercise Physiologist;Lisa Ysidro Evert, RN;Portia Rollene Rotunda, RN, BSN   Supervising physician immediately available to respond to emergencies [facility] immediately available   Physician(s) Dr. Cathlean Sauer   Medication changes reported     No   Warm-up and Cool-down Performed as group-led instruction   Resistance Training Performed Yes   VAD Patient? No     Pain Assessment   Currently in Pain? No/denies   Multiple Pain Sites No      Capillary Blood Glucose: No results found for this or any previous visit (from the past 24 hour(s)).      Exercise Prescription Changes - 09/15/16 1600      Response to Exercise   Blood Pressure (Admit) 96/50   Blood Pressure (Exercise) 100/50   Blood Pressure (Exit) 100/60   Heart Rate (Admit) 93 bpm   Heart Rate (Exercise) 97 bpm   Heart Rate (Exit) 73 bpm   Oxygen Saturation (Admit) 92 %   Oxygen Saturation (Exercise) 91 %   Oxygen Saturation (Exit) 99 %   Rating of Perceived Exertion (Exercise) 13   Perceived Dyspnea (Exercise) 1   Duration Progress to 45 minutes of aerobic exercise without signs/symptoms of physical distress   Intensity THRR unchanged     Progression   Progression Continue to progress workloads to maintain intensity without signs/symptoms of physical distress.     Resistance Training   Training Prescription Yes   Weight green bands   Reps 10-12  10 minutes of strength training     Interval Training   Interval Training No     Oxygen   Oxygen Continuous   Liters 8     Recumbant Bike   Level 4   Minutes 17     NuStep   Level 4   Minutes 17   METs 2.4     Track   Laps 7   Minutes 17     Goals Met:  Exercise tolerated well Strength training completed today  Goals Unmet:  Not Applicable  Comments: Service time is from 1330 to 1515    Dr. Rush Farmer is Medical Director for Pulmonary Rehab at [facility].

## 2016-09-17 ENCOUNTER — Encounter (HOSPITAL_COMMUNITY)
Admission: RE | Admit: 2016-09-17 | Discharge: 2016-09-17 | Disposition: A | Discharge status: Home / Self Care | Payer: Medicare Other | Source: Ambulatory Visit | Attending: Internal Medicine | Admitting: Internal Medicine

## 2016-09-17 VITALS — Wt 133.6 lb

## 2016-09-17 DIAGNOSIS — J9611 Chronic respiratory failure with hypoxia: Secondary | ICD-10-CM | POA: Diagnosis not present

## 2016-09-17 DIAGNOSIS — J8410 Pulmonary fibrosis, unspecified: Secondary | ICD-10-CM

## 2016-09-17 NOTE — Progress Notes (Signed)
Gilbert Dominguez 70 y.o. male Nutrition Note Spoke with pt. Pt eats 3 meals a day; most prepared at home. Pt states he drinks a Boost or Atkins shake daily to give him "more energy." Pt reports a history of 40 lb wt loss over the past several years. Wt in EMR reviewed. Pt wt is down 27 lb over the past year. Pt's Rate Your Plate results not reviewed with pt due to pt's need to gain wt. Pt expressed understanding of the information reviewed via feedback method.    No results found for: HGBA1C Weight /BMI 09/15/2016 09/08/2016 09/03/2016 09/01/2016  WEIGHT 130 lb 15.3 oz 135 lb 9.3 oz 132 lb 15 oz 133 lb 2.5 oz   Weight /BMI 08/27/2016 08/25/2016 08/20/2016 08/07/2016  WEIGHT 134 lb 4.2 oz 132 lb 4.4 oz 134 lb 14.7 oz 132 lb 7 oz   Weight /BMI 07/21/2016 04/20/2016 02/11/2016 12/31/2015 11/19/2015  WEIGHT 134 lb 134 lb 136 lb 9.6 oz 140 lb 141 lb 9.6 oz   Weight /BMI 04/11/2015 12/12/2014 09/04/2014 03/08/2014 12/08/2013  WEIGHT 157 lb 162 lb 160 lb 159 lb 154 lb   Weight /BMI 11/23/2013  WEIGHT 158 lb 3.2 oz   Nutrition Diagnosis ? Unintentional wt loss related to decreased food intake as evidenced by wt loss history.  Nutrition Intervention ? Pt's individual nutrition plan and goals reviewed with pt. ? Benefits of adopting healthy eating habits discussed when pt's Rate Your Plate reviewed. ? Pt to attend the Nutrition and Lung Disease class ? Handouts given for: High Calorie, High Protein diet and recipes ? Continual client-centered nutrition education by RD, as part of interdisciplinary care. Goal(s) 1. The pt will recognize symptoms that can interfere with adequate oral intake, such as shortness of breath, N/V, early satiety, fatigue, ability to secure and prepare food, taste and smell changes, chewing/swallowing difficulties, and/ or pain when eating. 2. The pt will consume high-energy, high-nutrient dense beverages when necessary to compensate for decreased oral intake of solid  foods. 3. Identify food quantities necessary to achieve wt gain of  -2# per week to a goal wt of 138-150 lb at graduation from pulmonary rehab. Monitor and Evaluate progress toward nutrition goal with team.   Derek Mound, M.Ed, RD, LDN, CDE 09/17/2016 3:47 PM

## 2016-09-17 NOTE — Progress Notes (Signed)
Daily Session Note  Patient Details  Name: Gilbert Dominguez MRN: [id number] Date of Birth: 1945/11/20 Referring Provider:   April Manson Pulmonary Rehab Walk Test from 08/11/2016 in [place]  Referring Provider  Dr. Melvyn Novas      Encounter Date: 09/17/2016  Check In:     Session Check In - 09/17/16 1348      Check-In   Location [facility]   Staff Present Rosebud Poles, RN, BSN;Molly diVincenzo, MS, ACSM RCEP, Exercise Physiologist;Lisa Ysidro Evert, RN;Portia Rollene Rotunda, RN, BSN   Supervising physician immediately available to respond to emergencies [facility] immediately available   Physician(s) Dr. Ree Kida   Medication changes reported     No   Fall or balance concerns reported    No   Warm-up and Cool-down Performed as group-led instruction   Resistance Training Performed Yes   VAD Patient? No     Pain Assessment   Currently in Pain? No/denies   Multiple Pain Sites No      Capillary Blood Glucose: No results found for this or any previous visit (from the past 24 hour(s)).      Exercise Prescription Changes - 09/17/16 1600      Response to Exercise   Blood Pressure (Admit) 100/54   Blood Pressure (Exercise) 100/68   Blood Pressure (Exit) 96/63   Heart Rate (Admit) 72 bpm   Heart Rate (Exercise) 90 bpm   Heart Rate (Exit) 69 bpm   Oxygen Saturation (Admit) 99 %   Oxygen Saturation (Exercise) 87 %   Oxygen Saturation (Exit) 96 %   Rating of Perceived Exertion (Exercise) 12   Perceived Dyspnea (Exercise) 3   Duration Progress to 45 minutes of aerobic exercise without signs/symptoms of physical distress   Intensity THRR unchanged     Progression   Progression Continue to progress workloads to maintain intensity without signs/symptoms of physical distress.     Resistance Training   Training Prescription Yes   Weight green bands   Reps 10-12  10 minutes of strength training     Interval Training   Interval  Training No     Oxygen   Oxygen Continuous   Liters 8     NuStep   Level 4   Minutes 17     Track   Laps 8   Minutes 17     Goals Met:  Exercise tolerated well Strength training completed today  Goals Unmet:  Not Applicable  Comments: Service time is from 1330 to 1520    Dr. Rush Farmer is Medical Director for Pulmonary Rehab at [facility].

## 2016-09-22 ENCOUNTER — Encounter (HOSPITAL_COMMUNITY)
Admission: RE | Admit: 2016-09-22 | Discharge: 2016-09-22 | Disposition: A | Discharge status: Home / Self Care | Payer: Medicare Other | Source: Ambulatory Visit | Attending: Internal Medicine | Admitting: Internal Medicine

## 2016-09-22 VITALS — Wt 132.1 lb

## 2016-09-22 DIAGNOSIS — R0600 Dyspnea, unspecified: Secondary | ICD-10-CM | POA: Diagnosis not present

## 2016-09-22 DIAGNOSIS — J9611 Chronic respiratory failure with hypoxia: Secondary | ICD-10-CM | POA: Diagnosis present

## 2016-09-22 DIAGNOSIS — J8410 Pulmonary fibrosis, unspecified: Secondary | ICD-10-CM | POA: Insufficient documentation

## 2016-09-22 NOTE — Progress Notes (Signed)
Daily Session Note  Patient Details  Name: Gilbert Dominguez MRN: [id number] Date of Birth: 1946/06/02 Referring Provider:   April Manson Pulmonary Rehab Walk Test from 08/11/2016 in [place]  Referring Provider  Dr. Melvyn Novas      Encounter Date: 09/22/2016  Check In:     Session Check In - 09/22/16 1353      Check-In   Location [facility]   Staff Present Trish Fountain, RN, Maxcine Ham, RN, BSN;Molly diVincenzo, MS, ACSM RCEP, Exercise Physiologist;Cela Newcom Ysidro Evert, RN   Supervising physician immediately available to respond to emergencies [facility] immediately available   Physician(s) Dr. Ree Kida   Medication changes reported     No   Fall or balance concerns reported    No   Warm-up and Cool-down Performed as group-led instruction   VAD Patient? No     Pain Assessment   Currently in Pain? No/denies   Multiple Pain Sites No      Capillary Blood Glucose: No results found for this or any previous visit (from the past 24 hour(s)).      Exercise Prescription Changes - 09/22/16 1500      Response to Exercise   Blood Pressure (Admit) 90/46   Blood Pressure (Exercise) 94/50   Blood Pressure (Exit) 94/50   Heart Rate (Admit) 80 bpm   Heart Rate (Exercise) 90 bpm   Heart Rate (Exit) 89 bpm   Oxygen Saturation (Admit) 89 %   Oxygen Saturation (Exercise) 90 %   Oxygen Saturation (Exit) 100 %   Rating of Perceived Exertion (Exercise) 11   Perceived Dyspnea (Exercise) 3   Duration Progress to 45 minutes of aerobic exercise without signs/symptoms of physical distress   Intensity THRR unchanged     Progression   Progression Continue to progress workloads to maintain intensity without signs/symptoms of physical distress.     Resistance Training   Training Prescription Yes   Weight green bands   Reps 10-12  10 minutes of strength training     Interval Training   Interval Training No     Oxygen   Oxygen  Continuous   Liters 8     Recumbant Bike   Level 4   Minutes 17     NuStep   Level 4   Minutes 17   METs 2.4     Track   Laps 7   Minutes 17     Goals Met:  Exercise tolerated well No report of cardiac concerns or symptoms Strength training completed today  Goals Unmet:  Not Applicable  Comments: Service time is from 1330 to 1515    Dr. Rush Farmer is Medical Director for Pulmonary Rehab at [facility].

## 2016-09-22 NOTE — Progress Notes (Signed)
Pulmonary Individual Treatment Plan  Patient Details  Name: Allex Lapoint MRN: [id number] Date of Birth: [birth year] Referring Provider:   April Manson Pulmonary Rehab Walk Test from 08/11/2016 in [place]  Referring Provider  Dr. Melvyn Novas      Initial Encounter Date:  Flowsheet Row Pulmonary Rehab Walk Test from 08/11/2016 in [place]  Date  08/11/16  Referring Provider  Dr. 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  alendronate (FOSAMAX) 70 MG tablet, Take 70 mg by mouth once a week. Take with a full glass of water on an empty stomach., Disp: , Rfl:  .  aspirin 81 MG tablet, Take 81 mg by mouth daily., Disp: , Rfl:  .  Calcium Carbonate-Vit D-Min (CALCIUM 600+D PLUS MINERALS PO), Take 1 tablet by mouth 2 (two) times daily., Disp: , Rfl:  .  famotidine (PEPCID) 20 MG tablet, TAKE 1 TABLET BY MOUTH AT BEDTIME, Disp: 30 tablet, Rfl: 2 .  furosemide (LASIX) 20 MG tablet, Take 20 mg by mouth every other day., Disp: , Rfl:  .  loratadine (CLARITIN) 10 MG tablet, Take 10 mg by mouth daily., Disp: , Rfl:  .  OXYGEN, 2 lpm with sleep and as needed with exertion, Disp: , Rfl:  .  pantoprazole (PROTONIX) 40 MG tablet, TAKE 1 TABLET BY MOUTH EVERY DAY *TAKE 30-60 MINUTES BEFORE FIRST MEAL OF THE DAY*, Disp: 30 tablet, Rfl: 11 .  predniSONE (DELTASONE) 10 MG tablet, TAKE 2 DAILY UNTIL BETTER THEN ONE DAILY UNTIL RETURN (Patient taking differently: TAKE 2 DAILY UNTIL BETTER THEN ONE DAILY), Disp: 60 tablet, Rfl: 2 .  Pseudoephedrine-Guaifenesin (MUCINEX D MAX STRENGTH) [phone] MG TB12, Take by mouth., Disp: , Rfl:  .  tadalafil (CIALIS) 20 MG tablet, Take 20 mg by mouth daily as needed for erectile dysfunction., Disp: , Rfl:  .  terazosin (HYTRIN) 1 MG capsule, Take 2 mg by mouth at bedtime. , Disp: , Rfl:  .   valsartan-hydrochlorothiazide (DIOVAN HCT) 160-12.5 MG tablet, Take 1 tablet by mouth daily. (Patient taking differently: Take 0.5 tablets by mouth daily. ), Disp: 30 tablet, Rfl: 11  Past Medical History: Past Medical History:  Diagnosis Date  . Chronic respiratory failure ([place])   . Hypertension   . 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 Rheumatoid arthritis(714.0)     Tobacco Use: History  Smoking Status  . Former Smoker  . Packs/day: 1.00  . Years: 36.00  . Types: Cigarettes  . Quit date: 10/14/1995  Smokeless Tobacco  . Not on file    Labs: Recent Review Flowsheet Data    There is no flowsheet data to display.      Capillary Blood Glucose: No results found for: GLUCAP   ADL UCSD:     Pulmonary Assessment Scores    Row Name 08/13/16 1115         ADL UCSD   ADL Phase Entry     SOB Score total 65        Pulmonary Function Assessment:     Pulmonary Function Assessment - 08/07/16 1101      Breath   Bilateral Breath Sounds Other   Other course crackles in bases bilat   Shortness of Breath Yes;Limiting activity      Exercise Target Goals:    Exercise Program Goal: Individual exercise prescription set with THRR, safety &  activity barriers. Participant demonstrates ability to understand and report RPE using BORG scale, to self-measure pulse accurately, and to acknowledge the importance of the exercise prescription.  Exercise Prescription Goal: Starting with aerobic activity 30 plus minutes a day, 3 days per week for initial exercise prescription. Provide home exercise prescription and guidelines that participant acknowledges understanding prior to discharge.  Activity Barriers & Risk Stratification:     Activity Barriers & Cardiac Risk Stratification - 08/07/16 1100      Activity Barriers & Cardiac Risk Stratification   Activity Barriers Shortness of Breath;Deconditioning      6 Minute Walk:     6 Minute Walk    Row Name 08/11/16  1625         6 Minute Walk   Phase Initial     Distance 600 feet     Walk Time -  4 minutes and 20 seconds total     # of Rest Breaks 2  first rest break 1 minute and 30 seconds --second rest break lasted 10 seconds     RPE 12     Perceived Dyspnea  2     Symptoms Yes (comment)     Comments dizzy when oxygen saturation dropped     Resting HR 82 bpm     Resting BP 95/60     Max Ex. HR 97 bpm     Max Ex. BP 104/63  BP 83/49 at 6 minutes     2 Minute Post BP 91/58       Interval HR   Baseline HR 82     1 Minute HR 89     2 Minute HR 92     3 Minute HR 90     4 Minute HR 94     5 Minute HR 95     6 Minute HR 97     2 Minute Post HR 93     Interval Heart Rate? Yes       Interval Oxygen   Interval Oxygen? Yes     Baseline Oxygen Saturation % 94 %     Baseline Liters of Oxygen 2 L     1 Minute Oxygen Saturation % 85 %     1 Minute Liters of Oxygen 2 L     2 Minute Oxygen Saturation % 80 %  stopped     2 Minute Liters of Oxygen 4 L     3 Minute Oxygen Saturation % 84 %     3 Minute Liters of Oxygen 6 L     4 Minute Oxygen Saturation % 88 %     4 Minute Liters of Oxygen 6 L     5 Minute Oxygen Saturation % 90 %     5 Minute Liters of Oxygen 6 L     6 Minute Oxygen Saturation % 88 %     6 Minute Liters of Oxygen 6 L     2 Minute Post Oxygen Saturation % 88 %     2 Minute Post Liters of Oxygen 6 L        Initial Exercise Prescription:     Initial Exercise Prescription - 08/11/16 1600      Date of Initial Exercise RX and Referring Provider   Date 08/11/16   Referring Provider Dr. Melvyn Novas     Oxygen   Oxygen Continuous   Liters 6     Recumbant Bike   Level 2   Minutes 17  NuStep   Level 2   Minutes 17   METs 1.5     Track   Laps 5   Minutes 17     Prescription Details   Frequency (times per week) 2   Duration Progress to 45 minutes of aerobic exercise without signs/symptoms of physical distress     Intensity   THRR 40-80% of Max Heartrate  59-118   Ratings of Perceived Exertion 11-13   Perceived Dyspnea 0-4     Progression   Progression Continue progressive overload as per policy without signs/symptoms or physical distress.     Resistance Training   Training Prescription Yes   Weight green bands   Reps 10-12      Perform Capillary Blood Glucose checks as needed.  Exercise Prescription Changes:     Exercise Prescription Changes    Row Name 08/20/16 1600 08/25/16 1500 08/27/16 1600 09/01/16 1500 09/03/16 1500     Exercise Review   Progression  - Yes  - Yes  -     Response to Exercise   Blood Pressure (Admit) 114/50 110/46 90/40 100/50 106/60   Blood Pressure (Exercise) 120/60 104/60 130/70 98/50 96/64   Blood Pressure (Exit) 106/64 106/64 104/62 100/60 98/64   Heart Rate (Admit) 73 bpm 81 bpm 84 bpm 89 bpm 70 bpm   Heart Rate (Exercise) 79 bpm 103 bpm 102 bpm 97 bpm 72 bpm   Heart Rate (Exit) 63 bpm 92 bpm 70 bpm 75 bpm 61 bpm   Oxygen Saturation (Admit) 89 % 92 % 91 % 98 % 98 %   Oxygen Saturation (Exercise) 86 %  O2 increased to 8L with exercise sat improved to 93. 91 % 90 % 90 % 94 %   Oxygen Saturation (Exit) 98 % 100 % 100 % 95 % 94 %   Rating of Perceived Exertion (Exercise) _0 Perceived Dyspnea (Exercise) _1 Duration Progress to 45 minutes of aerobic exercise without signs/symptoms of physical distress Progress to 45 minutes of aerobic exercise without signs/symptoms of physical distress Progress to 45 minutes of aerobic exercise without signs/symptoms of physical distress Progress to 45 minutes of aerobic exercise without signs/symptoms of physical distress Progress to 45 minutes of aerobic exercise without signs/symptoms of physical distress   Intensity Other (comment)  40-80% of HRR THRR unchanged THRR unchanged THRR unchanged THRR unchanged     Progression   Progression Continue to progress workloads to maintain intensity without signs/symptoms of physical distress. Continue to  progress workloads to maintain intensity without signs/symptoms of physical distress. Continue to progress workloads to maintain intensity without signs/symptoms of physical distress. Continue to progress workloads to maintain intensity without signs/symptoms of physical distress. Continue to progress workloads to maintain intensity without signs/symptoms of physical distress.     Resistance Training   Training Prescription _2    Weight _3    Reps 10-12  10 minutes of strength training 10-12  10 minutes of strength training 10-12  10 minutes of strength training 10-12  10 minutes of strength training 10-12  10 minutes of strength training     Interval Training   Interval Training _4      Oxygen   Oxygen _5    Liters _6 Recumbant Bike   Level  - 2  - 3  3   Minutes  - 17  - 17 17     NuStep   Level 2 3  - 4 4   Minutes 17 17  - 17 17   METs 2.3  -  - 2.3 1.8     Track   Laps _0 -   Minutes 17 17 34 17  -   Row Name 09/08/16 1552 09/15/16 1600 09/17/16 1600         Exercise Review   Progression Yes  -  -       Response to Exercise   Blood Pressure (Admit) 98/50 96/50 100/54     Blood Pressure (Exercise) 99/69 100/50 100/68     Blood Pressure (Exit) 100/60 100/60 96/63     Heart Rate (Admit) 85 bpm 93 bpm 72 bpm     Heart Rate (Exercise) 91 bpm 97 bpm 90 bpm     Heart Rate (Exit) 77 bpm 73 bpm 69 bpm     Oxygen Saturation (Admit) 91 % 92 % 99 %     Oxygen Saturation (Exercise) 88 % 91 % 87 %     Oxygen Saturation (Exit) 100 % 99 % 96 %     Rating of Perceived Exertion (Exercise) _1 Perceived Dyspnea (Exercise) _2 Duration Progress to 45 minutes of aerobic exercise without signs/symptoms of physical distress Progress to 45 minutes of aerobic exercise without signs/symptoms of physical distress Progress to  45 minutes of aerobic exercise without signs/symptoms of physical distress     Intensity THRR unchanged THRR unchanged THRR unchanged       Progression   Progression Continue to progress workloads to maintain intensity without signs/symptoms of physical distress. Continue to progress workloads to maintain intensity without signs/symptoms of physical distress. Continue to progress workloads to maintain intensity without signs/symptoms of physical distress.       Resistance Training   Training Prescription Yes Yes Yes     Weight green bands green bands green bands     Reps 10-12  10 minutes of strength training 10-12  10 minutes of strength training 10-12  10 minutes of strength training       Interval Training   Interval Training No No No       Oxygen   Oxygen Continuous Continuous Continuous     Liters _3 Recumbant Bike   Level 4 4  -     Minutes 17 17  -       NuStep   Level _4 Minutes _5 METs 2.2 2.4  -       Track   Laps _6 Minutes _7 Exercise Comments:     Exercise Comments    Row Name 08/24/16 1025 09/21/16 1645         Exercise Comments Patient has only attended one session. Will cont. to monitor. Patient is doing well in PR. Open to workload increases. Works hard when he is here. Oxygen saturation is monitored closely. Will cont. to monitor and progress.         Discharge Exercise Prescription (Final Exercise Prescription Changes):     Exercise Prescription Changes - 09/17/16 1600      Response to Exercise  Blood Pressure (Admit) 100/54   Blood Pressure (Exercise) 100/68   Blood Pressure (Exit) 96/63   Heart Rate (Admit) 72 bpm   Heart Rate (Exercise) 90 bpm   Heart Rate (Exit) 69 bpm   Oxygen Saturation (Admit) 99 %   Oxygen Saturation (Exercise) 87 %   Oxygen Saturation (Exit) 96 %   Rating of Perceived Exertion (Exercise) 12   Perceived Dyspnea (Exercise) 3   Duration Progress to 45 minutes of  aerobic exercise without signs/symptoms of physical distress   Intensity THRR unchanged     Progression   Progression Continue to progress workloads to maintain intensity without signs/symptoms of physical distress.     Resistance Training   Training Prescription Yes   Weight green bands   Reps 10-12  10 minutes of strength training     Interval Training   Interval Training No     Oxygen   Oxygen Continuous   Liters 8     NuStep   Level 4   Minutes 17     Track   Laps 8   Minutes 17       Nutrition:  Target Goals: Understanding of nutrition guidelines, daily intake of sodium <1516m, cholesterol <2034m calories 30% from fat and 7% or less from saturated fats, daily to have 5 or more servings of fruits and vegetables.  Biometrics:     Pre Biometrics - 08/07/16 1120      Pre Biometrics   Grip Strength 38 kg       Nutrition Therapy Plan and Nutrition Goals:     Nutrition Therapy & Goals - 09/17/16 1554      Nutrition Therapy   Diet High Calorie, High Protein     Personal Nutrition Goals   Personal Goal #1 Wt gain to a wt gain goal of 138-150 lb [facility] regarding individualized specific dietary modifications aiming towards targeted core components such as weight, hypertension, lipid management, diabetes, heart failure and other comorbidities.   Expected Outcomes Short Term Goal: Understand basic principles of dietary content, such as calories, fat, sodium, cholesterol and nutrients.;Long Term Goal: Adherence to prescribed nutrition plan.      Nutrition Discharge: Rate Your Plate Scores:     Nutrition Assessments - 09/17/16 1554      Rate Your Plate Scores   Pre Score 49  score appropriate due to pt needs to gain wt      Psychosocial: Target Goals: Acknowledge presence or absence of depression, maximize coping skills, provide positive support system.  Participant is able to verbalize types and ability to use techniques and skills needed for reducing stress and depression.  Initial Review & Psychosocial Screening:     Initial Psych Review & Screening - 08/07/16 1103      Family Dynamics   Good Support System? Yes     Barriers   Psychosocial barriers to participate in program There are no identifiable barriers or psychosocial needs.     Screening Interventions   Interventions Encouraged to exercise      Quality of Life Scores:     Quality of Life - 08/13/16 1116      Quality of Life Scores   Health/Function Pre 28.8 %   Socioeconomic Pre 29.69 %   Psych/Spiritual Pre 27.5 %   Family Pre 28.13 %   GLOBAL Pre 28.66 %      PHQ-9: Recent Review Flowsheet Data  Depression screen PHQ 2/9 08/07/2016   Decreased Interest 0   Down, Depressed, Hopeless 0   PHQ - 2 Score 0      Psychosocial Evaluation and Intervention:     Psychosocial Evaluation - 08/07/16 1103      Psychosocial Evaluation & Interventions   Interventions Encouraged to exercise with the program and follow exercise prescription      Psychosocial Re-Evaluation:     Psychosocial Re-Evaluation    [place] Name 08/24/16 1438 09/14/16 1412           Psychosocial Re-Evaluation   Interventions Encouraged to attend Pulmonary Rehabilitation for the exercise Encouraged to attend Pulmonary Rehabilitation for the exercise      Comments no psychosocial issues identified at this time No psychosocial issues identified at this time.      Continued Psychosocial Services Needed No No        Education: Education Goals: Education classes will be provided on a weekly basis, covering required topics. Participant will state understanding/return demonstration of topics presented.  Learning Barriers/Preferences:     Learning Barriers/Preferences - 08/07/16 1101      Learning Barriers/Preferences   Learning Barriers None   Learning Preferences  Computer/Internet;Group Instruction;Individual Instruction;Written Material;Verbal Instruction      Education Topics: Risk Factor Reduction:  -Group instruction that is supported by a PowerPoint presentation. Instructor discusses the definition of a risk factor, different risk factors for pulmonary disease, and how the heart and lungs work together.     Nutrition for Pulmonary Patient:  -Group instruction provided by PowerPoint slides, verbal discussion, and written materials to support subject matter. The instructor gives an explanation and review of healthy diet recommendations, which includes a discussion on weight management, recommendations for fruit and vegetable consumption, as well as protein, fluid, caffeine, fiber, sodium, sugar, and alcohol. Tips for eating when patients are short of breath are discussed. Flowsheet Row PULMONARY REHAB OTHER RESPIRATORY from 09/17/2016 in [place]  Date  08/27/16 [facility] Eating During the [place]  Educator  RD  Instruction Review Code  2- meets goals/outcomes      Pursed Lip Breathing:  -Group instruction that is supported by demonstration and informational handouts. Instructor discusses the benefits of pursed lip and diaphragmatic breathing and detailed demonstration on how to preform both.     Oxygen Safety:  -Group instruction provided by PowerPoint, verbal discussion, and written material to support subject matter. There is an overview of "What is Oxygen" and "Why do we need it".  Instructor also reviews how to create a safe environment for oxygen use, the importance of using oxygen as prescribed, and the risks of noncompliance. There is a brief discussion on traveling with oxygen and resources the patient may utilize. Flowsheet Row PULMONARY REHAB OTHER RESPIRATORY from 09/17/2016 in [place]  Date  08/20/16  Educator  rn  Instruction Review Code  2- meets goals/outcomes       Oxygen Equipment:  -Group instruction provided by [employer] Staff utilizing handouts, written materials, and equipment demonstrations. Flowsheet Row PULMONARY REHAB OTHER RESPIRATORY from 09/17/2016 in [place]  Date  09/03/16  Educator  Rep  Instruction Review Code  2- meets goals/outcomes      Signs and Symptoms:  -Group instruction provided by written material and verbal discussion to support subject matter. Warning signs and symptoms of infection, stroke, and heart attack are reviewed and when to call the physician/911 reinforced. Tips for preventing  the spread of infection discussed.   Advanced Directives:  -Group instruction provided by verbal instruction and written material to support subject matter. Instructor reviews Advanced Directive laws and proper instruction for filling out document.   Pulmonary Video:  -Group video education that reviews the importance of medication and oxygen compliance, exercise, good nutrition, pulmonary hygiene, and pursed lip and diaphragmatic breathing for the pulmonary patient.   Exercise for the Pulmonary Patient:  -Group instruction that is supported by a PowerPoint presentation. Instructor discusses benefits of exercise, core components of exercise, frequency, duration, and intensity of an exercise routine, importance of utilizing pulse oximetry during exercise, safety while exercising, and options of places to exercise outside of rehab.   Flowsheet Row PULMONARY REHAB OTHER RESPIRATORY from 09/17/2016 in [place]  Date  09/17/16  Educator  EP  Instruction Review Code  2- meets goals/outcomes      Pulmonary Medications:  -Verbally interactive group education provided by instructor with focus on inhaled medications and proper administration.   Anatomy and Physiology of the Respiratory System and Intimacy:  -Group instruction provided by PowerPoint, verbal  discussion, and written material to support subject matter. Instructor reviews respiratory cycle and anatomical components of the respiratory system and their functions. Instructor also reviews differences in obstructive and restrictive respiratory diseases with examples of each. Intimacy, Sex, and Sexuality differences are reviewed with a discussion on how relationships can change when diagnosed with pulmonary disease. Common sexual concerns are reviewed.   Knowledge Questionnaire Score:     Knowledge Questionnaire Score - 08/13/16 1115      Knowledge Questionnaire Score   Pre Score 9/13      Core Components/Risk Factors/Patient Goals at Admission:     Personal Goals and Risk Factors at Admission - 08/07/16 1102      Core Components/Risk Factors/Patient Goals on Admission   Increase Strength and Stamina Yes   Intervention Provide advice, education, support and counseling about physical activity/exercise needs.;Develop an individualized exercise prescription for aerobic and resistive training based on initial evaluation findings, risk stratification, comorbidities and participant's personal goals.   Expected Outcomes Achievement of increased cardiorespiratory fitness and enhanced flexibility, muscular endurance and strength shown through measurements of functional capacity and personal statement of participant.   Improve shortness of breath with ADL's Yes   Intervention Provide education, individualized exercise plan and daily activity instruction to help decrease symptoms of SOB with activities of daily living.   Expected Outcomes Short Term: Achieves a reduction of symptoms when performing activities of daily living.   Develop more efficient breathing techniques such as purse lipped breathing and diaphragmatic breathing; and practicing self-pacing with activity Yes   Intervention Provide education, demonstration and support about specific breathing techniuqes utilized for more efficient  breathing. Include techniques such as pursed lipped breathing, diaphragmatic breathing and self-pacing activity.   Expected Outcomes Short Term: Participant will be able to demonstrate and use breathing techniques as needed throughout daily activities.   Increase knowledge of respiratory medications and ability to use respiratory devices properly  Yes   Intervention Provide education and demonstration as needed of appropriate use of medications, inhalers, and oxygen therapy.   Expected Outcomes Short Term: Achieves understanding of medications use. Understands that oxygen is a medication prescribed by physician. Demonstrates appropriate use of inhaler and oxygen therapy.      Core Components/Risk Factors/Patient Goals Review:      Goals and Risk Factor Review    Row Name 08/24/16 1436 09/14/16 1410  Core Components/Risk Factors/Patient Goals Review   Personal Goals Review Increase Strength and Stamina;Develop more efficient breathing techniques such as purse lipped breathing and diaphragmatic breathing and practicing self-pacing with activity.;Improve shortness of breath with ADL's;Increase knowledge of respiratory medications and ability to use respiratory devices properly. Increase Strength and Stamina;Develop more efficient breathing techniques such as purse lipped breathing and diaphragmatic breathing and practicing self-pacing with activity.;Improve shortness of breath with ADL's;Increase knowledge of respiratory medications and ability to use respiratory devices properly.      Review Has only attended 1 exercise session, too early to have met any goals progressing well, nustep level 4, recumbent bike level 4, 7 laps on track.  Needs 8 liters of oxygen while exercising.        Expected Outcomes expect imrovement in goals in the next 30 days expect his strength and stamina will increase as workloads increase.           Core Components/Risk Factors/Patient Goals at Discharge  (Final Review):      Goals and Risk Factor Review - 09/14/16 1410      Core Components/Risk Factors/Patient Goals Review   Personal Goals Review Increase Strength and Stamina;Develop more efficient breathing techniques such as purse lipped breathing and diaphragmatic breathing and practicing self-pacing with activity.;Improve shortness of breath with ADL's;Increase knowledge of respiratory medications and ability to use respiratory devices properly.   Review progressing well, nustep level 4, recumbent bike level 4, 7 laps on track.  Needs 8 liters of oxygen while exercising.     Expected Outcomes expect his strength and stamina will increase as workloads increase.        ITP Comments:   Comments: ITP REVIEW Pt is making expected progress toward pulmonary rehab goals after completing 8 sessions. Recommend continued exercise, life style modification, education, and utilization of breathing techniques to increase stamina and strength and decrease shortness of breath with exertion.

## 2016-09-24 ENCOUNTER — Encounter (HOSPITAL_COMMUNITY)
Admission: RE | Admit: 2016-09-24 | Discharge: 2016-09-24 | Disposition: A | Discharge status: Home / Self Care | Payer: Medicare Other | Source: Ambulatory Visit | Attending: Internal Medicine | Admitting: Internal Medicine

## 2016-09-24 ENCOUNTER — Other Ambulatory Visit: Payer: Self-pay | Admitting: Internal Medicine

## 2016-09-24 VITALS — Wt 132.7 lb

## 2016-09-24 DIAGNOSIS — J9611 Chronic respiratory failure with hypoxia: Secondary | ICD-10-CM | POA: Diagnosis not present

## 2016-09-24 DIAGNOSIS — J8410 Pulmonary fibrosis, unspecified: Secondary | ICD-10-CM

## 2016-09-24 NOTE — Progress Notes (Signed)
Daily Session Note  Patient Details  Name: Gilbert Dominguez MRN: [id number] Date of Birth: Apr 11, 1946 Referring Provider:   April Manson [facility] Rehab Walk Test from 08/11/2016 in [place]  Referring Provider  Dr. Melvyn Novas      Encounter Date: 09/24/2016  Check In:     Session Check In - 09/24/16 1346      Check-In   Location [facility]   Staff Present Rosebud Poles, RN, BSN;Molly diVincenzo, MS, ACSM RCEP, Exercise Physiologist;Stellan Vick Rollene Rotunda, RN, Roque Cash, RN   Supervising physician immediately available to respond to emergencies [facility] immediately available   Physician(s) Dr. Allyson Sabal   Medication changes reported     No   Fall or balance concerns reported    No   Warm-up and Cool-down Performed as group-led instruction   Resistance Training Performed Yes   VAD Patient? No     Pain Assessment   Currently in Pain? No/denies   Multiple Pain Sites No      Capillary Blood Glucose: No results found for this or any previous visit (from the past 24 hour(s)).      Exercise Prescription Changes - 09/24/16 1548      Response to Exercise   Blood Pressure (Admit) 86/44   Blood Pressure (Exercise) 109/70   Blood Pressure (Exit) 92/62   Heart Rate (Admit) 76 bpm   Heart Rate (Exercise) 82 bpm   Heart Rate (Exit) 88 bpm   Oxygen Saturation (Admit) 92 %   Oxygen Saturation (Exercise) 92 %   Oxygen Saturation (Exit) 95 %   Rating of Perceived Exertion (Exercise) 13   Perceived Dyspnea (Exercise) 3   Duration Progress to 45 minutes of aerobic exercise without signs/symptoms of physical distress   Intensity THRR unchanged     Progression   Progression Continue to progress workloads to maintain intensity without signs/symptoms of physical distress.     Resistance Training   Training Prescription Yes   Weight green bands   Reps 10-12  10 minutes of strength training     Interval Training   Interval Training  No     Oxygen   Oxygen Continuous   Liters 8     NuStep   Level 4   Minutes 17   METs 2.6     Track   Laps 6   Minutes 17     Goals Met:  Improved SOB with ADL's Using PLB without cueing & demonstrates good technique Exercise tolerated well No report of cardiac concerns or symptoms Strength training completed today  Goals Unmet:  Not Applicable  Comments: Service time is from 1330 to 1530   Dr. Rush Farmer is Medical Director for Pulmonary Rehab at [facility].

## 2016-09-29 ENCOUNTER — Encounter (HOSPITAL_COMMUNITY): Payer: Medicare Other

## 2016-10-01 ENCOUNTER — Encounter (HOSPITAL_COMMUNITY): Payer: Medicare Other

## 2016-10-06 ENCOUNTER — Encounter (HOSPITAL_COMMUNITY)
Admission: RE | Admit: 2016-10-06 | Discharge: 2016-10-06 | Disposition: A | Discharge status: Home / Self Care | Payer: Medicare Other | Source: Ambulatory Visit | Attending: Internal Medicine | Admitting: Internal Medicine

## 2016-10-06 VITALS — Wt 134.9 lb

## 2016-10-06 DIAGNOSIS — J9611 Chronic respiratory failure with hypoxia: Secondary | ICD-10-CM | POA: Diagnosis not present

## 2016-10-06 DIAGNOSIS — J8410 Pulmonary fibrosis, unspecified: Secondary | ICD-10-CM

## 2016-10-06 NOTE — Progress Notes (Signed)
Daily Session Note  Patient Details  Name: Gilbert Dominguez MRN: [id number] Date of Birth: 04/13/1946 Referring Provider:   April Manson [facility]monary Rehab Walk Test from 08/11/2016 in [place]  Referring Provider  Dr. Melvyn Novas      Encounter Date: 10/06/2016  Check In:     Session Check In - 10/06/16 1539      Check-In   Location [facility]   Staff Present Su Hilt, MS, ACSM RCEP, Exercise Physiologist;Portia Rollene Rotunda, Therapist, sports, BSN;Ramon Dredge, RN, [facility]   Supervising physician immediately available to respond to emergencies [facility] immediately available   Physician(s) Dr. Carles Collet   Medication changes reported     No   Fall or balance concerns reported    No   Warm-up and Cool-down Performed as group-led instruction   Resistance Training Performed Yes   VAD Patient? No     Pain Assessment   Currently in Pain? No/denies   Multiple Pain Sites No      Capillary Blood Glucose: No results found for this or any previous visit (from the past 24 hour(s)).      Exercise Prescription Changes - 10/06/16 1600      Exercise Review   Progression Yes     Response to Exercise   Blood Pressure (Admit) 96/50   Blood Pressure (Exercise) 96/50   Blood Pressure (Exit) 98/58   Heart Rate (Admit) 76 bpm   Heart Rate (Exercise) 106 bpm   Heart Rate (Exit) 64 bpm   Oxygen Saturation (Admit) 93 %   Oxygen Saturation (Exercise) 87 %   Oxygen Saturation (Exit) 94 %   Rating of Perceived Exertion (Exercise) 13   Perceived Dyspnea (Exercise) 2   Duration Progress to 45 minutes of aerobic exercise without signs/symptoms of physical distress   Intensity THRR unchanged     Progression   Progression Continue to progress workloads to maintain intensity without signs/symptoms of physical distress.     Resistance Training   Training Prescription Yes   Weight green bands   Reps 10-12  10 minutes of strength training     Interval  Training   Interval Training No     Oxygen   Oxygen Continuous   Liters 8     Recumbant Bike   Level 4   Minutes 17     NuStep   Level 5   Minutes 17   METs 2.6     Track   Laps 7   Minutes 17     Goals Met:  Exercise tolerated well No report of cardiac concerns or symptoms Strength training completed today  Goals Unmet:  Not Applicable  Comments: Service time is from 1:30p to 3:0op    Dr. Rush Farmer is Medical Director for Pulmonary Rehab at [facility].

## 2016-10-08 ENCOUNTER — Encounter (HOSPITAL_COMMUNITY)
Admission: RE | Admit: 2016-10-08 | Discharge: 2016-10-08 | Disposition: A | Discharge status: Home / Self Care | Payer: Medicare Other | Source: Ambulatory Visit | Attending: Internal Medicine | Admitting: Internal Medicine

## 2016-10-08 VITALS — Wt 134.9 lb

## 2016-10-08 DIAGNOSIS — J8410 Pulmonary fibrosis, unspecified: Secondary | ICD-10-CM

## 2016-10-08 DIAGNOSIS — J9611 Chronic respiratory failure with hypoxia: Secondary | ICD-10-CM | POA: Diagnosis not present

## 2016-10-08 NOTE — Progress Notes (Signed)
Daily Session Note  Patient Details  Name: Gilbert Dominguez MRN: [id number] Date of Birth: Apr 18, 1946 Referring Provider:   April Manson [facility] Rehab Walk Test from 08/11/2016 in [place]  Referring Provider  Dr. Melvyn Novas      Encounter Date: 10/08/2016  Check In:     Session Check In - 10/08/16 1336      Check-In   Location [facility]   Staff Present Su Hilt, MS, ACSM RCEP, Exercise Physiologist;Joan Leonia Reeves, RN, Luisa Hart, RN, BSN   Supervising physician immediately available to respond to emergencies [facility] immediately available   Physician(s) Dr. Ree Kida   Medication changes reported     No   Fall or balance concerns reported    No   Warm-up and Cool-down Performed as group-led instruction   Resistance Training Performed Yes   VAD Patient? No     Pain Assessment   Currently in Pain? No/denies   Multiple Pain Sites No      Capillary Blood Glucose: No results found for this or any previous visit (from the past 24 hour(s)).      Exercise Prescription Changes - 10/08/16 1605      Response to Exercise   Blood Pressure (Admit) 94/50   Blood Pressure (Exercise) 102/50   Blood Pressure (Exit) 96/64   Heart Rate (Admit) 75 bpm   Heart Rate (Exercise) 97 bpm   Heart Rate (Exit) 70 bpm   Oxygen Saturation (Admit) 97 %   Oxygen Saturation (Exercise) 84 %  increased 90   Oxygen Saturation (Exit) 91 %   Rating of Perceived Exertion (Exercise) 13   Perceived Dyspnea (Exercise) 2   Duration Progress to 45 minutes of aerobic exercise without signs/symptoms of physical distress   Intensity THRR unchanged     Progression   Progression Continue to progress workloads to maintain intensity without signs/symptoms of physical distress.     Resistance Training   Training Prescription Yes   Weight green bands   Reps 10-12  10 minutes of strength training     Interval Training   Interval  Training No     Oxygen   Oxygen Continuous   Liters 8     Recumbant Bike   Level 4.5   Minutes 17     NuStep   Level 5   Minutes 17   METs 2.9     Goals Met:  Improved SOB with ADL's Using PLB without cueing & demonstrates good technique Exercise tolerated well No report of cardiac concerns or symptoms Strength training completed today  Goals Unmet:  Not Applicable  Comments: Service time is from 1330 to 1500   Dr. Rush Farmer is Medical Director for Pulmonary Rehab at [facility]

## 2016-10-13 ENCOUNTER — Encounter (HOSPITAL_COMMUNITY): Payer: Medicare Other

## 2016-10-15 ENCOUNTER — Encounter (HOSPITAL_COMMUNITY)
Admission: RE | Admit: 2016-10-15 | Discharge: 2016-10-15 | Disposition: A | Discharge status: Home / Self Care | Payer: Medicare Other | Source: Ambulatory Visit | Attending: Internal Medicine | Admitting: Internal Medicine

## 2016-10-15 DIAGNOSIS — J8410 Pulmonary fibrosis, unspecified: Secondary | ICD-10-CM

## 2016-10-15 NOTE — Progress Notes (Signed)
Pulmonary Individual Treatment Plan  Patient Details  Name: Gilbert Dominguez MRN: [id number] Date of Birth: 1945/10/24 Referring Provider:   April Manson Pulmonary Rehab Walk Test from 08/11/2016 in [place]  Referring Provider  Dr. Melvyn Novas      Initial Encounter Date:  Flowsheet Row Pulmonary Rehab Walk Test from 08/11/2016 in [place]  Date  08/11/16  Referring Provider  Dr. 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  alendronate (FOSAMAX) 70 MG tablet, Take 70 mg by mouth once a week. Take with a full glass of water on an empty stomach., Disp: , Rfl:  .  aspirin 81 MG tablet, Take 81 mg by mouth daily., Disp: , Rfl:  .  Calcium Carbonate-Vit D-Min (CALCIUM 600+D PLUS MINERALS PO), Take 1 tablet by mouth 2 (two) times daily., Disp: , Rfl:  .  famotidine (PEPCID) 20 MG tablet, TAKE 1 TABLET BY MOUTH AT BEDTIME, Disp: 30 tablet, Rfl: 2 .  furosemide (LASIX) 20 MG tablet, Take 20 mg by mouth every other day., Disp: , Rfl:  .  loratadine (CLARITIN) 10 MG tablet, Take 10 mg by mouth daily., Disp: , Rfl:  .  OXYGEN, 2 lpm with sleep and as needed with exertion, Disp: , Rfl:  .  pantoprazole (PROTONIX) 40 MG tablet, TAKE 1 TABLET BY MOUTH EVERY DAY *TAKE 30-60 MINUTES BEFORE FIRST MEAL OF THE DAY*, Disp: 30 tablet, Rfl: 11 .  predniSONE (DELTASONE) 10 MG tablet, TAKE 2 DAILY UNTIL BETTER THEN ONE DAILY UNTIL RETURN, Disp: 60 tablet, Rfl: 2 .  Pseudoephedrine-Guaifenesin (MUCINEX D MAX STRENGTH) [phone] MG TB12, Take by mouth., Disp: , Rfl:  .  tadalafil (CIALIS) 20 MG tablet, Take 20 mg by mouth daily as needed for erectile dysfunction., Disp: , Rfl:  .  terazosin (HYTRIN) 1 MG capsule, Take 2 mg by mouth at bedtime. , Disp: , Rfl:  .  valsartan-hydrochlorothiazide (DIOVAN HCT) 160-12.5 MG tablet, Take 1 tablet by  mouth daily. (Patient taking differently: Take 0.5 tablets by mouth daily. ), Disp: 30 tablet, Rfl: 11  Past Medical History: Past Medical History:  Diagnosis Date  . Chronic respiratory failure ([place])   . Hypertension   . 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 Rheumatoid arthritis(714.0)     Tobacco Use: History  Smoking Status  . Former Smoker  . Packs/day: 1.00  . Years: 36.00  . Types: Cigarettes  . Quit date: 10/14/1995  Smokeless Tobacco  . Not on file    Labs: Recent Review Flowsheet Data    There is no flowsheet data to display.      Capillary Blood Glucose: No results found for: GLUCAP   ADL UCSD:   Pulmonary Function Assessment:     Pulmonary Function Assessment - 08/07/16 1101      Breath   Bilateral Breath Sounds Other   Other course crackles in bases bilat   Shortness of Breath Yes;Limiting activity      Exercise Target Goals:    Exercise Program Goal: Individual exercise prescription set with THRR, safety & activity barriers. Participant demonstrates ability to understand and report RPE using BORG scale, to self-measure pulse accurately, and to acknowledge the importance of the exercise prescription.  Exercise Prescription Goal: Starting with aerobic activity 30 plus minutes a day, 3 days per week for initial exercise prescription. Provide home exercise prescription and guidelines  that participant acknowledges understanding prior to discharge.  Activity Barriers & Risk Stratification:     Activity Barriers & Cardiac Risk Stratification - 08/07/16 1100      Activity Barriers & Cardiac Risk Stratification   Activity Barriers Shortness of Breath;Deconditioning      6 Minute Walk:     6 Minute Walk    Row Name 08/11/16 1625         6 Minute Walk   Phase Initial     Distance 600 feet     Walk Time -  4 minutes and 20 seconds total     # of Rest Breaks 2  first rest break 1 minute and 30 seconds --second rest break lasted  10 seconds     RPE 12     Perceived Dyspnea  2     Symptoms Yes (comment)     Comments dizzy when oxygen saturation dropped     Resting HR 82 bpm     Resting BP 95/60     Max Ex. HR 97 bpm     Max Ex. BP 104/63  BP 83/49 at 6 minutes     2 Minute Post BP 91/58       Interval HR   Baseline HR 82     1 Minute HR 89     2 Minute HR 92     3 Minute HR 90     4 Minute HR 94     5 Minute HR 95     6 Minute HR 97     2 Minute Post HR 93     Interval Heart Rate? Yes       Interval Oxygen   Interval Oxygen? Yes     Baseline Oxygen Saturation % 94 %     Baseline Liters of Oxygen 2 L     1 Minute Oxygen Saturation % 85 %     1 Minute Liters of Oxygen 2 L     2 Minute Oxygen Saturation % 80 %  stopped     2 Minute Liters of Oxygen 4 L     3 Minute Oxygen Saturation % 84 %     3 Minute Liters of Oxygen 6 L     4 Minute Oxygen Saturation % 88 %     4 Minute Liters of Oxygen 6 L     5 Minute Oxygen Saturation % 90 %     5 Minute Liters of Oxygen 6 L     6 Minute Oxygen Saturation % 88 %     6 Minute Liters of Oxygen 6 L     2 Minute Post Oxygen Saturation % 88 %     2 Minute Post Liters of Oxygen 6 L        Initial Exercise Prescription:     Initial Exercise Prescription - 08/11/16 1600      Date of Initial Exercise RX and Referring Provider   Date 08/11/16   Referring Provider Dr. Melvyn Novas     Oxygen   Oxygen Continuous   Liters 6     Recumbant Bike   Level 2   Minutes 17     NuStep   Level 2   Minutes 17   METs 1.5     Track   Laps 5   Minutes 17     Prescription Details   Frequency (times per week) 2   Duration Progress to 45 minutes of aerobic exercise without signs/symptoms of  physical distress     Intensity   THRR 40-80% of Max Heartrate 59-118   Ratings of Perceived Exertion 11-13   Perceived Dyspnea 0-4     Progression   Progression Continue progressive overload as per policy without signs/symptoms or physical distress.     Resistance Training    Training Prescription Yes   Weight green bands   Reps 10-12      Perform Capillary Blood Glucose checks as needed.  Exercise Prescription Changes:     Exercise Prescription Changes    Row Name 08/20/16 1600 08/25/16 1500 08/27/16 1600 09/01/16 1500 09/03/16 1500     Exercise Review   Progression  - Yes  - Yes  -     Response to Exercise   Blood Pressure (Admit) 114/50 110/46 90/40 100/50 106/60   Blood Pressure (Exercise) 120/60 104/60 1_0   Blood Pressure (Exit) 106/64 106/64 104/62 100/60 98/64   Heart Rate (Admit) 73 bpm 81 bpm 84 bpm 89 bpm 70 bpm   Heart Rate (Exercise) 79 bpm 103 bpm 102 bpm 97 bpm 72 bpm   Heart Rate (Exit) 63 bpm 92 bpm 70 bpm 75 bpm 61 bpm   Oxygen Saturation (Admit) 89 % 92 % 91 % 98 % 98 %   Oxygen Saturation (Exercise) 86 %  O2 increased to 8L with exercise sat improved to 93. 91 % 90 % 90 % 94 %   Oxygen Saturation (Exit) 98 % 100 % 100 % 95 % 94 %   Rating of Perceived Exertion (Exercise) _1 Perceived Dyspnea (Exercise) _2 Duration Progress to 45 minutes of aerobic exercise without signs/symptoms of physical distress Progress to 45 minutes of aerobic exercise without signs/symptoms of physical distress Progress to 45 minutes of aerobic exercise without signs/symptoms of physical distress Progress to 45 minutes of aerobic exercise without signs/symptoms of physical distress Progress to 45 minutes of aerobic exercise without signs/symptoms of physical distress   Intensity Other (comment)  40-80% of HRR THRR unchanged THRR unchanged THRR unchanged THRR unchanged     Progression   Progression Continue to progress workloads to maintain intensity without signs/symptoms of physical distress. Continue to progress workloads to maintain intensity without signs/symptoms of physical distress. Continue to progress workloads to maintain intensity without signs/symptoms of physical distress. Continue to progress workloads to  maintain intensity without signs/symptoms of physical distress. Continue to progress workloads to maintain intensity without signs/symptoms of physical distress.     Resistance Training   Training Prescription _3    Weight _4    Reps 10-12  10 minutes of strength training 10-12  10 minutes of strength training 10-12  10 minutes of strength training 10-12  10 minutes of strength training 10-12  10 minutes of strength training     Interval Training   Interval Training _5      Oxygen   Oxygen _6    Liters _7 Recumbant Bike   Level  - 2  - 3 3   Minutes  - 17  - 17 17     NuStep   Level 2 3  - 4 4   Minutes 17 17  - 17 17   METs 2.3  -  - 2.3 1.8     Track   Laps  _0 -   Minutes 17 17 34 17  -   Row Name 09/08/16 1552 09/15/16 1600 09/17/16 1600 09/22/16 1500 09/24/16 1548     Exercise Review   Progression Yes  -  -  -  -     Response to Exercise   Blood Pressure (Admit) 98/50 96/50 1_1   Blood Pressure (Exercise) 99/69 100/50 100/68 94/50 109/70   Blood Pressure (Exit) 100/60 1_2 92/62   Heart Rate (Admit) 85 bpm 93 bpm 72 bpm 80 bpm 76 bpm   Heart Rate (Exercise) 91 bpm 97 bpm 90 bpm 90 bpm 82 bpm   Heart Rate (Exit) 77 bpm 73 bpm 69 bpm 89 bpm 88 bpm   Oxygen Saturation (Admit) 91 % 92 % 99 % 89 % 92 %   Oxygen Saturation (Exercise) 88 % 91 % 87 % 90 % 92 %   Oxygen Saturation (Exit) 100 % 99 % 96 % 100 % 95 %   Rating of Perceived Exertion (Exercise) _3 Perceived Dyspnea (Exercise) _4 Duration Progress to 45 minutes of aerobic exercise without signs/symptoms of physical distress Progress to 45 minutes of aerobic exercise without signs/symptoms of physical distress Progress to 45 minutes of aerobic exercise without signs/symptoms of physical distress Progress to 45  minutes of aerobic exercise without signs/symptoms of physical distress Progress to 45 minutes of aerobic exercise without signs/symptoms of physical distress   Intensity _5      Progression   Progression Continue to progress workloads to maintain intensity without signs/symptoms of physical distress. Continue to progress workloads to maintain intensity without signs/symptoms of physical distress. Continue to progress workloads to maintain intensity without signs/symptoms of physical distress. Continue to progress workloads to maintain intensity without signs/symptoms of physical distress. Continue to progress workloads to maintain intensity without signs/symptoms of physical distress.     Resistance Training   Training Prescription _6    Weight _7    Reps 10-12  10 minutes of strength training 10-12  10 minutes of strength training 10-12  10 minutes of strength training 10-12  10 minutes of strength training 10-12  10 minutes of strength training     Interval Training   Interval Training _8      Oxygen   Oxygen _9    Liters _10 Recumbant Bike   Level 4 4  - 4  -   Minutes 17 17  - 17  -     NuStep   Level _11 Minutes _12 METs 2.2 2.4  - 2.4 2.6     Track   Laps _13 Minutes _14 Philip Name 10/06/16 1600 10/08/16 1605           Exercise Review   Progression Yes  -        Response to Exercise   Blood Pressure (Admit) 96/50 94/50      Blood Pressure (Exercise) 96/50 102/50      Blood Pressure (Exit) 98/58 96/64      Heart Rate (Admit) 76 bpm 75 bpm  Heart Rate (Exercise) 106 bpm 97 bpm      Heart Rate (Exit) 64 bpm 70 bpm      Oxygen Saturation (Admit) 93 % 97 %      Oxygen Saturation (Exercise) 87 % 84 %   increased 90      Oxygen Saturation (Exit) 94 % 91 %      Rating of Perceived Exertion (Exercise) 13 13      Perceived Dyspnea (Exercise) 2 2      Duration Progress to 45 minutes of aerobic exercise without signs/symptoms of physical distress Progress to 45 minutes of aerobic exercise without signs/symptoms of physical distress      Intensity THRR unchanged THRR unchanged        Progression   Progression Continue to progress workloads to maintain intensity without signs/symptoms of physical distress. Continue to progress workloads to maintain intensity without signs/symptoms of physical distress.        Resistance Training   Training Prescription Yes Yes      Weight green bands green bands      Reps 10-12  10 minutes of strength training 10-12  10 minutes of strength training        Interval Training   Interval Training No No        Oxygen   Oxygen Continuous Continuous      Liters 8 8        Recumbant Bike   Level 4 4.5      Minutes 17 17        NuStep   Level 5 5      Minutes 17 17      METs 2.6 2.9        Track   Laps 7  -      Minutes 17  -         Exercise Comments:     Exercise Comments    Row Name 08/24/16 1025 09/21/16 1645 10/08/16 1003       Exercise Comments Patient has only attended one session. Will cont. to monitor. Patient is doing well in PR. Open to workload increases. Works hard when he is here. Oxygen saturation is monitored closely. Will cont. to monitor and progress. Patient is doing well in PR. Open to workload increases. Works hard when he is here. Oxygen saturation is monitored closely. Will cont. to monitor and progress.        Discharge Exercise Prescription (Final Exercise Prescription Changes):     Exercise Prescription Changes - 10/08/16 1605      Response to Exercise   Blood Pressure (Admit) 94/50   Blood Pressure (Exercise) 102/50   Blood Pressure (Exit) 96/64   Heart Rate (Admit) 75 bpm   Heart Rate (Exercise) 97 bpm    Heart Rate (Exit) 70 bpm   Oxygen Saturation (Admit) 97 %   Oxygen Saturation (Exercise) 84 %  increased 90   Oxygen Saturation (Exit) 91 %   Rating of Perceived Exertion (Exercise) 13   Perceived Dyspnea (Exercise) 2   Duration Progress to 45 minutes of aerobic exercise without signs/symptoms of physical distress   Intensity THRR unchanged     Progression   Progression Continue to progress workloads to maintain intensity without signs/symptoms of physical distress.     Resistance Training   Training Prescription Yes   Weight green bands   Reps 10-12  10 minutes of strength training     Interval Training   Interval Training No  Oxygen   Oxygen Continuous   Liters 8     Recumbant Bike   Level 4.5   Minutes 17     NuStep   Level 5   Minutes 17   METs 2.9       Nutrition:  Target Goals: Understanding of nutrition guidelines, daily intake of sodium <1528m, cholesterol <2038m calories 30% from fat and 7% or less from saturated fats, daily to have 5 or more servings of fruits and vegetables.  Biometrics:     Pre Biometrics - 08/07/16 1120      Pre Biometrics   Grip Strength 38 kg       Nutrition Therapy Plan and Nutrition Goals:     Nutrition Therapy & Goals - 09/17/16 1554      Nutrition Therapy   Diet High Calorie, High Protein     Personal Nutrition Goals   Personal Goal #1 Wt gain to a wt gain goal of 138-150 lb at [facility] from [facility] counsel regarding individualized specific dietary modifications aiming towards targeted core components such as weight, hypertension, lipid management, diabetes, heart failure and other comorbidities.   Expected Outcomes Short Term Goal: Understand basic principles of dietary content, such as calories, fat, sodium, cholesterol and nutrients.;Long Term Goal: Adherence to prescribed nutrition plan.      Nutrition Discharge: Rate Your Plate  Scores:     Nutrition Assessments - 09/17/16 1554      Rate Your Plate Scores   Pre Score 49  score appropriate due to pt needs to gain wt      Psychosocial: Target Goals: Acknowledge presence or absence of depression, maximize coping skills, provide positive support system. Participant is able to verbalize types and ability to use techniques and skills needed for reducing stress and depression.  Initial Review & Psychosocial Screening:     Initial Psych Review & Screening - 08/07/16 1103      Family Dynamics   Good Support System? Yes     Barriers   Psychosocial barriers to participate in program There are no identifiable barriers or psychosocial needs.     Screening Interventions   Interventions Encouraged to exercise      Quality of Life Scores:   PHQ-9: Recent Review Flowsheet Data    [facility] 08/[facility]2016   Decreased Interest 0   Down, Depressed, Hopeless 0   PHQ - 2 Score 0      Psychosocial Evaluation and Intervention:     Psychosocial Evaluation - 08/07/16 1103      Psychosocial Evaluation & Interventions   Interventions Encouraged to exercise with the program and follow exercise prescription      Psychosocial Re-Evaluation:     Psychosocial Re-Evaluation    [place] 08/24/16 1438 09/14/16 1412 10/05/16 1422         Psychosocial Re-Evaluation   Interventions Encouraged to attend Pulmonary Rehabilitation for the exercise Encouraged to attend Pulmonary Rehabilitation for the exercise Encouraged to attend Pulmonary Rehabilitation for the exercise     Comments no psychosocial issues identified at this time No psychosocial issues identified at this time. No psychosocial issues identified     Continued Psychosocial Services Needed No No No       Education: Education Goals: Education classes will be provided on a weekly basis, covering required topics. Participant will state understanding/return demonstration of topics  presented.  Learning Barriers/Preferences:     Learning Barriers/Preferences -  08/07/16 1101      Learning Barriers/Preferences   Learning Barriers None   Learning Preferences Computer/Internet;Group Instruction;Individual Instruction;Written Material;Verbal Instruction      Education Topics: Risk Factor Reduction:  -Group instruction that is supported by a PowerPoint presentation. Instructor discusses the definition of a risk factor, different risk factors for pulmonary disease, and how the heart and lungs work together.     Nutrition for Pulmonary Patient:  -Group instruction provided by PowerPoint slides, verbal discussion, and written materials to support subject matter. The instructor gives an explanation and review of healthy diet recommendations, which includes a discussion on weight management, recommendations for fruit and vegetable consumption, as well as protein, fluid, caffeine, fiber, sodium, sugar, and alcohol. Tips for eating when patients are short of breath are discussed. Flowsheet Row PULMONARY REHAB OTHER RESPIRATORY from 10/08/2016 in [place]  Date  08/27/16 [facility] Eating During the [place]  Educator  RD  Instruction Review Code  2- meets goals/outcomes      Pursed Lip Breathing:  -Group instruction that is supported by demonstration and informational handouts. Instructor discusses the benefits of pursed lip and diaphragmatic breathing and detailed demonstration on how to preform both.     Oxygen Safety:  -Group instruction provided by PowerPoint, verbal discussion, and written material to support subject matter. There is an overview of "What is Oxygen" and "Why do we need it".  Instructor also reviews how to create a safe environment for oxygen use, the importance of using oxygen as prescribed, and the risks of noncompliance. There is a brief discussion on traveling with oxygen and resources the patient may utilize. Flowsheet  Row PULMONARY REHAB OTHER RESPIRATORY from 10/08/2016 in [place]  Date  08/20/16  Educator  rn  Instruction Review Code  2- meets goals/outcomes      Oxygen Equipment:  -Group instruction provided by [employer] Staff utilizing handouts, written materials, and equipment demonstrations. Flowsheet Row PULMONARY REHAB OTHER RESPIRATORY from 10/08/2016 in [place]  Date  09/03/16  Educator  Rep  Instruction Review Code  2- meets goals/outcomes      Signs and Symptoms:  -Group instruction provided by written material and verbal discussion to support subject matter. Warning signs and symptoms of infection, stroke, and heart attack are reviewed and when to call the physician/911 reinforced. Tips for preventing the spread of infection discussed. Flowsheet Row PULMONARY REHAB OTHER RESPIRATORY from 10/08/2016 in [place]  Date  09/24/16  Educator  rn  Instruction Review Code  2- meets goals/outcomes      Advanced Directives:  -Group instruction provided by verbal instruction and written material to support subject matter. Instructor reviews Advanced Directive laws and proper instruction for filling out document.   Pulmonary Video:  -Group video education that reviews the importance of medication and oxygen compliance, exercise, good nutrition, pulmonary hygiene, and pursed lip and diaphragmatic breathing for the pulmonary patient.   Exercise for the Pulmonary Patient:  -Group instruction that is supported by a PowerPoint presentation. Instructor discusses benefits of exercise, core components of exercise, frequency, duration, and intensity of an exercise routine, importance of utilizing pulse oximetry during exercise, safety while exercising, and options of places to exercise outside of rehab.   Flowsheet Row PULMONARY REHAB OTHER RESPIRATORY from 10/08/2016 in [place]  Date  09/17/16  Educator  EP  Instruction Review Code  2- meets goals/outcomes  Pulmonary Medications:  -Verbally interactive group education provided by instructor with focus on inhaled medications and proper administration.   Anatomy and Physiology of the Respiratory System and Intimacy:  -Group instruction provided by PowerPoint, verbal discussion, and written material to support subject matter. Instructor reviews respiratory cycle and anatomical components of the respiratory system and their functions. Instructor also reviews differences in obstructive and restrictive respiratory diseases with examples of each. Intimacy, Sex, and Sexuality differences are reviewed with a discussion on how relationships can change when diagnosed with pulmonary disease. Common sexual concerns are reviewed.   Knowledge Questionnaire Score:   Core Components/Risk Factors/Patient Goals at Admission:     Personal Goals and Risk Factors at Admission - 08/07/16 1102      Core Components/Risk Factors/Patient Goals on Admission   Increase Strength and Stamina Yes   Intervention Provide advice, education, support and counseling about physical activity/exercise needs.;Develop an individualized exercise prescription for aerobic and resistive training based on initial evaluation findings, risk stratification, comorbidities and participant's personal goals.   Expected Outcomes Achievement of increased cardiorespiratory fitness and enhanced flexibility, muscular endurance and strength shown through measurements of functional capacity and personal statement of participant.   Improve shortness of breath with ADL's Yes   Intervention Provide education, individualized exercise plan and daily activity instruction to help decrease symptoms of SOB with activities of daily living.   Expected Outcomes Short Term: Achieves a reduction of symptoms when performing activities of daily living.   Develop more  efficient breathing techniques such as purse lipped breathing and diaphragmatic breathing; and practicing self-pacing with activity Yes   Intervention Provide education, demonstration and support about specific breathing techniuqes utilized for more efficient breathing. Include techniques such as pursed lipped breathing, diaphragmatic breathing and self-pacing activity.   Expected Outcomes Short Term: Participant will be able to demonstrate and use breathing techniques as needed throughout daily activities.   Increase knowledge of respiratory medications and ability to use respiratory devices properly  Yes   Intervention Provide education and demonstration as needed of appropriate use of medications, inhalers, and oxygen therapy.   Expected Outcomes Short Term: Achieves understanding of medications use. Understands that oxygen is a medication prescribed by physician. Demonstrates appropriate use of inhaler and oxygen therapy.      Core Components/Risk Factors/Patient Goals Review:      Goals and Risk Factor Review    Row Name 08/24/16 1436 09/14/16 1410 10/05/16 1419         Core Components/Risk Factors/Patient Goals Review   Personal Goals Review Increase Strength and Stamina;Develop more efficient breathing techniques such as purse lipped breathing and diaphragmatic breathing and practicing self-pacing with activity.;Improve shortness of breath with ADL's;Increase knowledge of respiratory medications and ability to use respiratory devices properly. Increase Strength and Stamina;Develop more efficient breathing techniques such as purse lipped breathing and diaphragmatic breathing and practicing self-pacing with activity.;Improve shortness of breath with ADL's;Increase knowledge of respiratory medications and ability to use respiratory devices properly. Increase Strength and Stamina;Develop more efficient breathing techniques such as purse lipped breathing and diaphragmatic breathing and practicing  self-pacing with activity.;Improve shortness of breath with ADL's;Increase knowledge of respiratory medications and ability to use respiratory devices properly.     Review Has only attended 1 exercise session, too early to have met any goals progressing well, nustep level 4, recumbent bike level 4, 7 laps on track.  Needs 8 liters of oxygen while exercising.   has been sick the last week, equipment has stayed the same, will  increase levels when recovered from illness.     Expected Outcomes expect imrovement in goals in the next 30 days expect his strength and stamina will increase as workloads increase.   continue to progress when recovered        Core Components/Risk Factors/Patient Goals at Discharge (Final Review):      Goals and Risk Factor Review - 10/05/16 1419      Core Components/Risk Factors/Patient Goals Review   Personal Goals Review Increase Strength and Stamina;Develop more efficient breathing techniques such as purse lipped breathing and diaphragmatic breathing and practicing self-pacing with activity.;Improve shortness of breath with ADL's;Increase knowledge of respiratory medications and ability to use respiratory devices properly.   Review has been sick the last week, equipment has stayed the same, will increase levels when recovered from illness.   Expected Outcomes continue to progress when recovered      ITP Comments:   Comments: ITP REVIEW Pt is making expected progress toward pulmonary rehab goals after completing 12 sessions. Recommend continued exercise, life style modification, education, and utilization of breathing techniques to increase stamina and strength and decrease shortness of breath with exertion.

## 2016-10-20 ENCOUNTER — Encounter (HOSPITAL_COMMUNITY)
Admission: RE | Admit: 2016-10-20 | Discharge: 2016-10-20 | Disposition: A | Discharge status: Home / Self Care | Payer: Medicare Other | Source: Ambulatory Visit | Attending: Internal Medicine | Admitting: Internal Medicine

## 2016-10-20 VITALS — Wt 136.5 lb

## 2016-10-20 DIAGNOSIS — J9611 Chronic respiratory failure with hypoxia: Secondary | ICD-10-CM | POA: Diagnosis not present

## 2016-10-20 DIAGNOSIS — R0600 Dyspnea, unspecified: Secondary | ICD-10-CM | POA: Diagnosis not present

## 2016-10-20 DIAGNOSIS — J8410 Pulmonary fibrosis, unspecified: Secondary | ICD-10-CM | POA: Diagnosis not present

## 2016-10-20 NOTE — Progress Notes (Signed)
Daily Session Note  Patient Details  Name: Gilbert Dominguez MRN: [id number] Date of Birth: 1945-12-07 Referring Provider:   April Manson [facility] Walk Test from 08/11/2016 in [place]  Referring Provider  Dr. Melvyn Novas      Encounter Date: 10/20/2016  Check In:     Session Check In - 10/20/16 1320      Check-In   Location [facility]   Staff Present Rosebud Poles, RN, BSN;Molly diVincenzo, MS, ACSM RCEP, Exercise Physiologist;Lisa Ysidro Evert, RN   Supervising physician immediately available to respond to emergencies [facility] immediately available   Physician(s) Dr. Quincy Simmonds   Medication changes reported     No   Fall or balance concerns reported    No   Warm-up and Cool-down Performed as group-led instruction   Resistance Training Performed Yes   VAD Patient? No     Pain Assessment   Currently in Pain? No/denies   Multiple Pain Sites No      Capillary Blood Glucose: No results found for this or any previous visit (from the past 24 hour(s)).      Exercise Prescription Changes - 10/20/16 1500      Exercise Review   Progression Yes     Response to Exercise   Blood Pressure (Admit) 90/50   Blood Pressure (Exercise) 94/52   Blood Pressure (Exit) 106/60   Heart Rate (Admit) 91 bpm   Heart Rate (Exercise) 102 bpm   Heart Rate (Exit) 72 bpm   Oxygen Saturation (Admit) 90 %   Oxygen Saturation (Exercise) 86 %   Oxygen Saturation (Exit) 98 %   Rating of Perceived Exertion (Exercise) 11   Perceived Dyspnea (Exercise) 1   Duration Progress to 45 minutes of aerobic exercise without signs/symptoms of physical distress   Intensity THRR unchanged     Progression   Progression Continue to progress workloads to maintain intensity without signs/symptoms of physical distress.     Resistance Training   Training Prescription Yes   Weight green bands   Reps 10-12  10 minutes of strength training     Interval Training   Interval Training No     Oxygen   Oxygen Continuous   Liters 8     Recumbant Bike   Level 5.1   Minutes 17     NuStep   Level 5   Minutes 17   METs 2.2     Track   Laps 8   Minutes 17     Goals Met:  Exercise tolerated well Strength training completed today  Goals Unmet:  Not Applicable  Comments: Service time is from 1330 to 1500    Dr. Rush Farmer is Medical Director for Pulmonary Rehab at [facility].

## 2016-10-22 ENCOUNTER — Ambulatory Visit: Payer: Medicare Other | Admitting: Internal Medicine

## 2016-10-22 ENCOUNTER — Encounter (HOSPITAL_COMMUNITY): Payer: Medicare Other

## 2016-10-27 ENCOUNTER — Encounter (HOSPITAL_COMMUNITY): Payer: Medicare Other

## 2016-10-29 ENCOUNTER — Encounter (HOSPITAL_COMMUNITY)
Admission: RE | Admit: 2016-10-29 | Discharge: 2016-10-29 | Disposition: A | Discharge status: Home / Self Care | Payer: Medicare Other | Source: Ambulatory Visit | Attending: Internal Medicine | Admitting: Internal Medicine

## 2016-10-29 VITALS — Wt 136.9 lb

## 2016-10-29 DIAGNOSIS — J9611 Chronic respiratory failure with hypoxia: Secondary | ICD-10-CM | POA: Diagnosis not present

## 2016-10-29 DIAGNOSIS — J8410 Pulmonary fibrosis, unspecified: Secondary | ICD-10-CM

## 2016-10-29 NOTE — Progress Notes (Signed)
Daily Session Note  Patient Details  Name: Gilbert Dominguez MRN: [id number] Date of Birth: 01/02/1946 Referring Provider:   April Manson Pulmonary Rehab Walk Test from 08/11/2016 in [place]  Referring Provider  Dr. Melvyn Novas      Encounter Date: 10/29/2016  Check In:     Session Check In - 10/29/16 1336      Check-In   Location [facility]   Staff Present Su Hilt, MS, ACSM RCEP, Exercise Physiologist;Joan Leonia Reeves, RN, Luisa Hart, RN, Roque Cash, RN   Supervising physician immediately available to respond to emergencies [facility] immediately available   Physician(s) Dr. Eliseo Squires   Medication changes reported     No   Fall or balance concerns reported    No   Warm-up and Cool-down Performed as group-led instruction   Resistance Training Performed Yes   VAD Patient? No     Pain Assessment   Currently in Pain? No/denies   Multiple Pain Sites No      Capillary Blood Glucose: No results found for this or any previous visit (from the past 24 hour(s)).      Exercise Prescription Changes - 10/29/16 1500      Response to Exercise   Blood Pressure (Admit) 104/60   Blood Pressure (Exercise) 100/70   Blood Pressure (Exit) 112/60   Heart Rate (Admit) 90 bpm   Heart Rate (Exercise) 106 bpm   Heart Rate (Exit) 68 bpm   Oxygen Saturation (Admit) 93 %   Oxygen Saturation (Exercise) 86 %  slowly improved to 89 with rest, continue to assess   Oxygen Saturation (Exit) 96 %   Rating of Perceived Exertion (Exercise) 13   Perceived Dyspnea (Exercise) 2   Duration Progress to 45 minutes of aerobic exercise without signs/symptoms of physical distress   Intensity THRR unchanged     Progression   Progression Continue to progress workloads to maintain intensity without signs/symptoms of physical distress.     Resistance Training   Training Prescription Yes   Weight green bands   Reps 10-12  10 minutes of  strength training     Interval Training   Interval Training No     Oxygen   Oxygen Continuous   Liters 8     Recumbant Bike   Level 5   Minutes 17     NuStep   Level 5   Minutes 17   METs 2.2     Goals Met:  Exercise tolerated well No report of cardiac concerns or symptoms Strength training completed today  Goals Unmet:  Not Applicable  Comments: Service time is from 1330 to 1540    Dr. Rush Farmer is Medical Director for Pulmonary Rehab at [facility].

## 2016-11-03 ENCOUNTER — Ambulatory Visit (INDEPENDENT_AMBULATORY_CARE_PROVIDER_SITE_OTHER): Payer: Medicare Other | Admitting: Internal Medicine

## 2016-11-03 ENCOUNTER — Encounter (HOSPITAL_COMMUNITY)
Admission: RE | Admit: 2016-11-03 | Discharge: 2016-11-03 | Disposition: A | Discharge status: Home / Self Care | Payer: Medicare Other | Source: Ambulatory Visit | Attending: Internal Medicine | Admitting: Internal Medicine

## 2016-11-03 ENCOUNTER — Encounter: Payer: Self-pay | Admitting: Internal Medicine

## 2016-11-03 VITALS — BP 102/58 | HR 95 | Ht 64.0 in | Wt 136.2 lb

## 2016-11-03 VITALS — Wt 136.9 lb

## 2016-11-03 DIAGNOSIS — J8410 Pulmonary fibrosis, unspecified: Secondary | ICD-10-CM | POA: Diagnosis not present

## 2016-11-03 DIAGNOSIS — J9611 Chronic respiratory failure with hypoxia: Secondary | ICD-10-CM

## 2016-11-03 NOTE — Progress Notes (Signed)
Daily Session Note  Patient Details  Name: Gilbert Dominguez MRN: [id number] Date of Birth: 1946-08-17 Referring Provider:   April Manson Pulmonary Rehab Walk Test from 08/11/2016 in [place]  Referring Provider  Dr. Melvyn Novas      Encounter Date: 11/03/2016  Check In:     Session Check In - 11/03/16 1339      Check-In   Location [facility]   Staff Present Rosebud Poles, RN, BSN;Molly diVincenzo, MS, ACSM RCEP, Exercise Physiologist;Kerensa Nicklas Ysidro Evert, RN;Portia Rollene Rotunda, RN, BSN   Supervising physician immediately available to respond to emergencies [facility] immediately available   Physician(s) Dr. Eliseo Squires   Medication changes reported     No   Fall or balance concerns reported    No   Warm-up and Cool-down Performed as group-led instruction   Resistance Training Performed Yes   VAD Patient? No     Pain Assessment   Currently in Pain? No/denies   Multiple Pain Sites No      Capillary Blood Glucose: No results found for this or any previous visit (from the past 24 hour(s)).      Exercise Prescription Changes - 11/03/16 1500      Response to Exercise   Blood Pressure (Admit) 92/50   Blood Pressure (Exercise) 136/70   Blood Pressure (Exit) 94/50   Heart Rate (Admit) 82 bpm   Heart Rate (Exercise) 98 bpm   Heart Rate (Exit) 73 bpm   Oxygen Saturation (Admit) 99 %   Oxygen Saturation (Exercise) 92 %   Oxygen Saturation (Exit) 100 %   Rating of Perceived Exertion (Exercise) 13   Perceived Dyspnea (Exercise) 3   Duration Progress to 45 minutes of aerobic exercise without signs/symptoms of physical distress   Intensity THRR unchanged     Progression   Progression Continue to progress workloads to maintain intensity without signs/symptoms of physical distress.     Resistance Training   Training Prescription Yes   Weight green bands   Reps 10-12  10 minutes of strength training     Interval Training   Interval Training  No     Oxygen   Oxygen Continuous   Liters 8     Recumbant Bike   Level 5   Minutes 17     NuStep   Level 5   Minutes 17   METs 2.1     Track   Laps 3   Minutes 17     Goals Met:  Exercise tolerated well No report of cardiac concerns or symptoms Strength training completed today  Goals Unmet:  Not Applicable  Comments: Service time is from 1330 to 1500    Dr. Rush Farmer is Medical Director for Pulmonary Rehab at [facility].

## 2016-11-03 NOTE — Progress Notes (Signed)
Subjective:    Patient ID: Gilbert Dominguez, male    DOB: Oct 04, 1946   MRN: [id number]   Brief patient profile:  83  yowm quit smoking 1996 with dx of RA around 2010 followed by [facility] referred 11/23/2013 to pulmonary clinic for ? ILD with pfts w/w mod restriction  09/04/2014 unchanged from 09/2013      History of Present Illness  11/23/2013 1st [facility] office visit/ Gilbert Dominguez cc new doe x Jan 2014 and stopped all RA meds(mtx) in august of 2014 - onset was insidious and gradual to point where walk 50 ft esp last month assoc with dry cough but ok  control of arthritis ( vs historical control) and mild chronic nasal congestion s purulent secretions or sinus pain or epistaxis  rec Please see patient coordinator before you leave today  to schedule 02 24/7  2lpm at rest and 4lpm with activity  Prednisone 20 mg twice daily with meals  Pantoprazole (protonix) 40 mg   Take 30-60 min before first meal of the day and Pepcid 20 mg one bedtime until return to office - this is the best way to tell whether stomach acid is contributing to your problem.   GERD diet reviewed   03/08/2014 f/u ov/Gilbert Dominguez re: ILD / 02 dep at hs and ex not using 02 as rec  Chief Complaint  Patient presents with  . Follow-up    c/o nonprod cough after cold food/drinks.  No other complaints at this time.   can walk medium pace flat s 02 for 15-20 min and sats are in mid 80s  arthritis and breathing much better since rx with prednisone being tapered down by Dr Charlestine Night to 20 mg per day  rec No need for 02 at rest or room to room walking Please use 2lpm at hs and   2lpm with walking outside more than 200 ft  Please see patient coordinator before you leave today  to schedule a portable system for your daily walk   09/04/2014 f/u ov/Gilbert Dominguez re: ILD/ new cough  Chief Complaint  Patient presents with  . Follow-up    PFT done today. Breathing is unchanged. Pt c/o cough for the past 6 wks- "makes me have to clear throat".   just  using 02 at hs / Not limited by breathing from desired activities   Cough is new problem x 6 weeks day > noct min white mucus  On fosfamax  Weaned off pred effective early August  Started acutely with ? [name] feels like persistent pnds  rec I recommend against taking fosfamax as long as you are coughing as it may contribute to the cough  For drainage >>take chlortrimeton (chlorpheniramine) 4 mg every 4 hours available over the counter (may cause drowsiness, available over the counter) For cough>> Take delsym two tsp every 12 hours   to suppress the urge to cough. Swallowing water or using ice chips/non mint and non menthol containing candies (such as lifesavers or sugarless jolly ranchers) are also effective.        12/12/2014 f/u ov/Gilbert Dominguez re: PF/ RA Chief Complaint  Patient presents with  . Follow-up    Pt states that his breathing is overall doing well. He is only using o2 with sleep @ 2lpm. He states not clearing his throat as often.    no longer on fosfamax  On chlorpheniramine>>  throat clearing and drainage better   rec You will need to learn to walk at a slow pace or wear 02  when doing more than 200 ft at a normal pace    11/19/2015  f/u ov/Gilbert Dominguez re: pf/ RA  Chief Complaint  Patient presents with  . Follow-up    Breathing has been worse for the past 3 months. He states he gets SOB walking "very short distances". He also c/o increased cough-non prod, esp after exertion.   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 Follow-up    Breathing is worse, using o2 more often. He also c/o increased cough- occ  prod with clear sputum.   rec Please remember to go to the lab department downstairs for your tests - we will call you with the results when they are available. Prednisone 10 mg take 2 each am until cough / breathing better then 1 daily until return GERD diet    02/11/2016  f/u ov/Gilbert Dominguez re: pf/RA /steroid/02 dep  Chief Complaint  Patient presents with  . Follow-up    Cough and SOB are unchanged. No new co's today.   w/in  A few days of pred 20 felt better then changed to 10 mg several weeks later on 01/29/16 and holding his own. On 2lpm doe = MMRC3 = can't walk 100 yards even at a slow pace at a flat grade s stopping due to sob   Started on fosfamax one week prior to OV  And so far no increase in upper airway cough or urge to clear throat/dysphagia on ppi and H2 HS  rec Prednisone 10 mg take 2 until better then 2 alternating with 1  For two weeks, then reduce to 1 daily and if worsen go back to 20 mg daily and start over  Goal on 02 is to keep it above 88%  If throat clearing or swallowing worsen on the fosfamax we may to consider an alternative but I will notify Dr Charlestine Night of this concern and no need to stop the fosfamax at this point or change to the IV form (Reclast) yearly but this is an option    04/20/2016  f/u ov/Gilbert Dominguez re: PF/ RA / pred 20/10 alternating/ 02 2lpmg at bedtime and prn daytime  Chief Complaint  Patient presents with  . Follow-up    PFT's done today. Breathing is unchanged. Cough "may have gotten worse"- non prod.   up and down aisles at HT one day prior to OV  s having to stop at slow pace s 02  MMRC2 = can't walk a nl pace on a flat grade s sob but does fine slow and flat   Arthritis ok control per pt / still mild dysphagia on fosfamax  Def better breathing/cough control with higher doses of pred but trying to maintain at 20/10 for now per rheum rec No change in recommendations We will be referring you to pulmonary rehab     07/21/2016  f/u ov/Gilbert Dominguez re:   PF/ RA /  still on pred 20/10 and 02 2lpm 24/7  Chief Complaint  Patient presents with  . Follow-up    Pt. states breathing is doing  good, Pt. c/o of coughing, Pt. feels SOB with activity No wheezing, pt. denies chest pain  doe still MMRC2 = can't walk a nl pace on a flat grade s sob but does fine slow and flat eg walking at [facility] on 2lpm but not monitoring sats as rec and never titrates flow up using a pulsed system with activity  rec Please see patient coordinator before you leave today  to schedule pulmonary rehab  Ok to adjust 02 to keep your saturations above 88% at all times  If breathing or coughing worse please increase prednisone up to 20 mg daily until better then again step down to 20 alternating with 10 mg daily     11/03/2016  f/u ov/Gilbert Dominguez re: PF / pred 10 mg x 3 weeks - has started rehab and req as much as 8lpm with exertion  Chief Complaint  Patient presents with  . Follow-up    Pt. states his breathing has remained unchanged, Still coughing, not producing much, still sob with exertion Denies chest pain or tightness   No change doe = MMRC2   No obvious day to day or daytime variability or assoc excess/ purulent sputum or mucus plugs or hemoptysis or cp or chest tightness, subjective wheeze or overt sinus or hb symptoms. No unusual exp hx or h/o childhood pna/ asthma or knowledge of premature birth.  Sleeping ok without nocturnal  or early am exacerbation  of respiratory  c/o's or need for noct saba. Also denies any obvious fluctuation of symptoms with weather or environmental changes or other aggravating or alleviating factors except as outlined above   Current Medications, Allergies, Complete Past Medical History, Past Surgical History, Family History, and Social History were reviewed in [employer] record.  ROS  The following are not active complaints unless bolded sore throat, dysphagia, dental problems, itching, sneezing,  nasal congestion or excess/ purulent  secretions, ear ache,   fever, chills, sweats, unintended wt loss, classically pleuritic or exertional cp,  orthopnea pnd or leg swelling, presyncope, palpitations, abdominal pain, anorexia, nausea, vomiting, diarrhea  or change in bowel or bladder habits, change in stools or urine, dysuria,hematuria,  rash, arthralgias about the same , visual complaints, headache, numbness, weakness or ataxia or problems with walking or coordination,  change in mood/affect or memory.                         Objective:   Physical Exam  amb wm nad / vital signs reviewed -  - Note on arrival 02 sats  88% on 2lpm     09/04/2014      160 > 12/12/2014   162 > 04/11/2015   158  > 11/19/2015   142  > 12/31/2015 140 > 02/11/2016  137 > 04/20/2016  134 > 07/21/2016 134 >  11/03/2016   136     03/08/14 159 lb (72.122 kg)  12/08/13 154 lb (69.854 kg)  11/23/13 158 lb 3.2 oz (71.759 kg)           HEENT: nl dentition, turbinates, and oropharynx. Nl external ear canals without cough reflex   NECK :  without JVD/Nodes/TM/ nl carotid upstrokes bilaterally   LUNGS:   dry insp crackles in bases bilaterally s cough on insp    CV:  RRR  no s3 or murmur or increase in P2, no edema  - pos Raynaud's changes both hands   ABD:  soft and nontender with nl excursion in  the supine position. No bruits or organomegaly, bowel sounds nl  MS:  warm without deformities, calf tenderness, cyanosis- mild to mod clubbng   SKIN: warm and dry without lesions    NEURO:  alert, approp, no deficits        CXR PA and Lateral:   11/19/2015 :    I personally reviewed images and agree with radiology impression as follows:    Chronic pulmonary fibrotic changes. There has been slight overall increase in the conspicuity of these interstitial markings especially in the right lower lung. No acute cardiopulmonary abnormality is observed.               Assessment & Plan:

## 2016-11-03 NOTE — Patient Instructions (Addendum)
Adjust your 02 to maintain your sats in upper 80 or lower 90s  Please schedule a follow up visit in 3 months but call sooner if needed with pfts on return

## 2016-11-05 ENCOUNTER — Encounter (HOSPITAL_COMMUNITY): Payer: Medicare Other

## 2016-11-06 NOTE — Assessment & Plan Note (Signed)
-  pfts 09/27/14        VC 2.2(59%) no obst with dlco 38 corrects to 63 - PFTs 09/04/2014 VC 2.3 (62%) no obst with dlco 38 corrects to 83%  - RA sats 62% 11/23/2013 at rest > started 24h 02 (see chronic respiratory failure)  - 12/12/2014  Walked RA  2 laps @ 185 ft each stopped due to  desat to 84% nl pace  - 11/23/2013 ESR 95 rx pred 20 mg bid  >  12/08/2013 = 14 > rx per [facility]  - PFTs 04/11/2015    VC 2.1 s obst and dlco 41 corrects to 75% - ESR 11/19/2015 = 14  - ESR 12/31/2015 =  48 and worse sob/cough > try prednisone 20 mg daily until better and then 10 mg maintenance until follow-up office visit. - PFTs  04/20/2016    VC 2.15 (60%) s obst and dlco 23/22 and corrects to 47%  - 07/21/2016 Referred to rehab > started early Nov 2017  maint on pred 10 mg daily - The goal with a chronic steroid dependent illness is always arriving at the lowest effective dose that controls the disease/symptoms and not accepting a set "formula" which is based on statistics or guidelines that don't always take into account patient  variability or the natural hx of the dz in every individual patient, which may well vary over time.  For now defer adjustments on prednisone to Dr Charlestine Night

## 2016-11-06 NOTE — Assessment & Plan Note (Signed)
-  RA sats 62% 11/23/2013  - 11/23/2013   Walked 4lpm  x one lap @ 185 stopped due to desat to 73%  - 12/08/2013  Rest 94% RA,  Walked 4lpm x 3 laps @ 185 ft each stopped due to  End of study, sats still 94%  - 03/08/2014  Walked RA  2 laps @ 185 ft each stopped due to sats 86 corrected on 2lpm   - 12/12/2014  Walked RA x 2 laps @ 185 ft each stopped due to  sats 84 nl pace  - 04/11/2015  Walked RA x 2 laps @ 185 ft each stopped due to sats 83%nl pace  - 11/19/2015   Walked RA x one lap @ 185 stopped due to  83% nl pace/ resolved on 2lpm     rec 11/06/2016 >>>   2lpm at hs and  Titrate daytime with goal of > 88% at all times - requiring up to 8lpm with exertion at rehab

## 2016-11-10 ENCOUNTER — Encounter (HOSPITAL_COMMUNITY)
Admission: RE | Admit: 2016-11-10 | Discharge: 2016-11-10 | Disposition: A | Discharge status: Home / Self Care | Payer: Medicare Other | Source: Ambulatory Visit | Attending: Internal Medicine | Admitting: Internal Medicine

## 2016-11-10 VITALS — Wt 137.8 lb

## 2016-11-10 DIAGNOSIS — J9611 Chronic respiratory failure with hypoxia: Secondary | ICD-10-CM | POA: Diagnosis not present

## 2016-11-10 DIAGNOSIS — J8410 Pulmonary fibrosis, unspecified: Secondary | ICD-10-CM

## 2016-11-10 NOTE — Progress Notes (Signed)
Pulmonary Individual Treatment Plan  Patient Details  Name: Gilbert Dominguez MRN: [id number] Date of Birth: [birth year] Referring Provider:   April Manson Pulmonary Rehab Walk Test from 08/11/2016 in [place]  Referring Provider  Dr. Melvyn Novas      Initial Encounter Date:  Flowsheet Row Pulmonary Rehab Walk Test from 08/11/2016 in [place]  Date  08/11/16  Referring Provider  Dr. 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  alendronate (FOSAMAX) 70 MG tablet, Take 70 mg by mouth once a week. Take with a full glass of water on an empty stomach., Disp: , Rfl:  .  aspirin 81 MG tablet, Take 81 mg by mouth daily., Disp: , Rfl:  .  Calcium Carbonate-Vit D-Min (CALCIUM 600+D PLUS MINERALS PO), Take 1 tablet by mouth 2 (two) times daily., Disp: , Rfl:  .  chlorpheniramine (CHLOR-TRIMETON) 4 MG tablet, Take 4 mg by mouth 2 (two) times daily as needed for allergies., Disp: , Rfl:  .  famotidine (PEPCID) 20 MG tablet, TAKE 1 TABLET BY MOUTH AT BEDTIME, Disp: 30 tablet, Rfl: 2 .  furosemide (LASIX) 20 MG tablet, Take 20 mg by mouth every other day., Disp: , Rfl:  .  loratadine (CLARITIN) 10 MG tablet, Take 10 mg by mouth daily., Disp: , Rfl:  .  OXYGEN, 2 lpm with sleep and as needed with exertion, Disp: , Rfl:  .  pantoprazole (PROTONIX) 40 MG tablet, TAKE 1 TABLET BY MOUTH EVERY DAY *TAKE 30-60 MINUTES BEFORE FIRST MEAL OF THE DAY*, Disp: 30 tablet, Rfl: 11 .  predniSONE (DELTASONE) 10 MG tablet, TAKE 2 DAILY UNTIL BETTER THEN ONE DAILY UNTIL RETURN, Disp: 60 tablet, Rfl: 2 .  Pseudoephedrine-Guaifenesin (MUCINEX D MAX STRENGTH) [phone] MG TB12, Take by mouth., Disp: , Rfl:  .  tadalafil (CIALIS) 20 MG tablet, Take 20 mg by mouth daily as needed for erectile dysfunction., Disp: , Rfl:  .  terazosin (HYTRIN) 1 MG capsule,  Take 2 mg by mouth at bedtime. , Disp: , Rfl:  .  valsartan-hydrochlorothiazide (DIOVAN HCT) 160-12.5 MG tablet, Take 1 tablet by mouth daily. (Patient taking differently: Take 0.5 tablets by mouth daily. ), Disp: 30 tablet, Rfl: 11  Past Medical History: Past Medical History:  Diagnosis Date  . Chronic respiratory failure ([place])   . Hypertension   . 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 Rheumatoid arthritis(714.0)     Tobacco Use: History  Smoking Status  . Former Smoker  . Packs/day: 1.00  . Years: 36.00  . Types: Cigarettes  . Quit date: 10/14/1995  Smokeless Tobacco  . Not on file    Labs: Recent Review Flowsheet Data    There is no flowsheet data to display.      Capillary Blood Glucose: No results found for: GLUCAP   ADL UCSD:   Pulmonary Function Assessment:     Pulmonary Function Assessment - 08/07/16 1101      Breath   Bilateral Breath Sounds Other   Other course crackles in bases bilat   Shortness of Breath Yes;Limiting activity      Exercise Target Goals:    Exercise Program Goal: Individual exercise prescription set with THRR, safety & activity barriers. Participant demonstrates ability to understand and report RPE using BORG scale, to self-measure pulse accurately, and to acknowledge the importance of the exercise prescription.  Exercise Prescription  Goal: Starting with aerobic activity 30 plus minutes a day, 3 days per week for initial exercise prescription. Provide home exercise prescription and guidelines that participant acknowledges understanding prior to discharge.  Activity Barriers & Risk Stratification:     Activity Barriers & Cardiac Risk Stratification - 08/07/16 1100      Activity Barriers & Cardiac Risk Stratification   Activity Barriers Shortness of Breath;Deconditioning      6 Minute Walk:     6 Minute Walk    Row Name 08/11/16 1625         6 Minute Walk   Phase Initial     Distance 600 feet     Walk  Time -  4 minutes and 20 seconds total     # of Rest Breaks 2  first rest break 1 minute and 30 seconds --second rest break lasted 10 seconds     RPE 12     Perceived Dyspnea  2     Symptoms Yes (comment)     Comments dizzy when oxygen saturation dropped     Resting HR 82 bpm     Resting BP 95/60     Max Ex. HR 97 bpm     Max Ex. BP 104/63  BP 83/49 at 6 minutes     2 Minute Post BP 91/58       Interval HR   Baseline HR 82     1 Minute HR 89     2 Minute HR 92     3 Minute HR 90     4 Minute HR 94     5 Minute HR 95     6 Minute HR 97     2 Minute Post HR 93     Interval Heart Rate? Yes       Interval Oxygen   Interval Oxygen? Yes     Baseline Oxygen Saturation % 94 %     Baseline Liters of Oxygen 2 L     1 Minute Oxygen Saturation % 85 %     1 Minute Liters of Oxygen 2 L     2 Minute Oxygen Saturation % 80 %  stopped     2 Minute Liters of Oxygen 4 L     3 Minute Oxygen Saturation % 84 %     3 Minute Liters of Oxygen 6 L     4 Minute Oxygen Saturation % 88 %     4 Minute Liters of Oxygen 6 L     5 Minute Oxygen Saturation % 90 %     5 Minute Liters of Oxygen 6 L     6 Minute Oxygen Saturation % 88 %     6 Minute Liters of Oxygen 6 L     2 Minute Post Oxygen Saturation % 88 %     2 Minute Post Liters of Oxygen 6 L        Initial Exercise Prescription:     Initial Exercise Prescription - 08/11/16 1600      Date of Initial Exercise RX and Referring Provider   Date 08/11/16   Referring Provider Dr. Melvyn Novas     Oxygen   Oxygen Continuous   Liters 6     Recumbant Bike   Level 2   Minutes 17     NuStep   Level 2   Minutes 17   METs 1.5     Track   Laps 5   Minutes 17  Prescription Details   Frequency (times per week) 2   Duration Progress to 45 minutes of aerobic exercise without signs/symptoms of physical distress     Intensity   THRR 40-80% of Max Heartrate 59-118   Ratings of Perceived Exertion 11-13   Perceived Dyspnea 0-4      Progression   Progression Continue progressive overload as per policy without signs/symptoms or physical distress.     Resistance Training   Training Prescription Yes   Weight green bands   Reps 10-12      Perform Capillary Blood Glucose checks as needed.  Exercise Prescription Changes:     Exercise Prescription Changes    Row Name 08/20/16 1600 08/25/16 1500 08/27/16 1600 09/01/16 1500 09/03/16 1500     Exercise Review   Progression  - Yes  - Yes  -     Response to Exercise   Blood Pressure (Admit) 114/50 110/46 90/40 100/50 106/60   Blood Pressure (Exercise) 120/60 104/60 1_0   Blood Pressure (Exit) 106/64 106/64 104/62 100/60 98/64   Heart Rate (Admit) 73 bpm 81 bpm 84 bpm 89 bpm 70 bpm   Heart Rate (Exercise) 79 bpm 103 bpm 102 bpm 97 bpm 72 bpm   Heart Rate (Exit) 63 bpm 92 bpm 70 bpm 75 bpm 61 bpm   Oxygen Saturation (Admit) 89 % 92 % 91 % 98 % 98 %   Oxygen Saturation (Exercise) 86 %  O2 increased to 8L with exercise sat improved to 93. 91 % 90 % 90 % 94 %   Oxygen Saturation (Exit) 98 % 100 % 100 % 95 % 94 %   Rating of Perceived Exertion (Exercise) _1 Perceived Dyspnea (Exercise) _2 Duration Progress to 45 minutes of aerobic exercise without signs/symptoms of physical distress Progress to 45 minutes of aerobic exercise without signs/symptoms of physical distress Progress to 45 minutes of aerobic exercise without signs/symptoms of physical distress Progress to 45 minutes of aerobic exercise without signs/symptoms of physical distress Progress to 45 minutes of aerobic exercise without signs/symptoms of physical distress   Intensity Other (comment)  40-80% of HRR THRR unchanged THRR unchanged THRR unchanged THRR unchanged     Progression   Progression Continue to progress workloads to maintain intensity without signs/symptoms of physical distress. Continue to progress workloads to maintain intensity without signs/symptoms of physical  distress. Continue to progress workloads to maintain intensity without signs/symptoms of physical distress. Continue to progress workloads to maintain intensity without signs/symptoms of physical distress. Continue to progress workloads to maintain intensity without signs/symptoms of physical distress.     Resistance Training   Training Prescription _3    Weight _4    Reps 10-12  10 minutes of strength training 10-12  10 minutes of strength training 10-12  10 minutes of strength training 10-12  10 minutes of strength training 10-12  10 minutes of strength training     Interval Training   Interval Training _5      Oxygen   Oxygen _6    Liters _7 Recumbant Bike   Level  - 2  - 3 3   Minutes  - 17  - 17 17     NuStep   Level 2 3  - 4 4   Minutes 17 17  -  17 17   METs 2.3  -  - 2.3 1.8     Track   Laps _0 -   Minutes 17 17 34 17  -   Row Name 09/08/16 1552 09/15/16 1600 09/17/16 1600 09/22/16 1500 09/24/16 1548     Exercise Review   Progression Yes  -  -  -  -     Response to Exercise   Blood Pressure (Admit) 98/50 96/50 1_1   Blood Pressure (Exercise) 99/69 100/50 100/68 94/50 109/70   Blood Pressure (Exit) 100/60 1_2 92/62   Heart Rate (Admit) 85 bpm 93 bpm 72 bpm 80 bpm 76 bpm   Heart Rate (Exercise) 91 bpm 97 bpm 90 bpm 90 bpm 82 bpm   Heart Rate (Exit) 77 bpm 73 bpm 69 bpm 89 bpm 88 bpm   Oxygen Saturation (Admit) 91 % 92 % 99 % 89 % 92 %   Oxygen Saturation (Exercise) 88 % 91 % 87 % 90 % 92 %   Oxygen Saturation (Exit) 100 % 99 % 96 % 100 % 95 %   Rating of Perceived Exertion (Exercise) _3 Perceived Dyspnea (Exercise) _4 Duration Progress to 45 minutes of aerobic exercise without signs/symptoms of physical distress Progress to 45 minutes of aerobic exercise without  signs/symptoms of physical distress Progress to 45 minutes of aerobic exercise without signs/symptoms of physical distress Progress to 45 minutes of aerobic exercise without signs/symptoms of physical distress Progress to 45 minutes of aerobic exercise without signs/symptoms of physical distress   Intensity _5      Progression   Progression Continue to progress workloads to maintain intensity without signs/symptoms of physical distress. Continue to progress workloads to maintain intensity without signs/symptoms of physical distress. Continue to progress workloads to maintain intensity without signs/symptoms of physical distress. Continue to progress workloads to maintain intensity without signs/symptoms of physical distress. Continue to progress workloads to maintain intensity without signs/symptoms of physical distress.     Resistance Training   Training Prescription _6    Weight _7    Reps 10-12  10 minutes of strength training 10-12  10 minutes of strength training 10-12  10 minutes of strength training 10-12  10 minutes of strength training 10-12  10 minutes of strength training     Interval Training   Interval Training _8      Oxygen   Oxygen _9    Liters _10 Recumbant Bike   Level 4 4  - 4  -   Minutes 17 17  - 17  -     NuStep   Level _11 Minutes _12 METs 2.2 2.4  - 2.4 2.6     Track   Laps _13 Minutes _14 [place] Name 10/06/16 1600 10/08/16 1605 10/20/16 1500 10/29/16 1500 11/03/16 1500     Exercise Review   Progression Yes  - Yes  -  -     Response to Exercise   Blood Pressure (Admit) _15 104/60 92/50   Blood Pressure (Exercise) 96/50 102/50 94/52 100/70 136/70  Blood Pressure (Exit) 98/58 96/64 106/60  112/60 94/50   Heart Rate (Admit) 76 bpm 75 bpm 91 bpm 90 bpm 82 bpm   Heart Rate (Exercise) 106 bpm 97 bpm 102 bpm 106 bpm 98 bpm   Heart Rate (Exit) 64 bpm 70 bpm 72 bpm 68 bpm 73 bpm   Oxygen Saturation (Admit) 93 % 97 % 90 % 93 % 99 %   Oxygen Saturation (Exercise) 87 % 84 %  increased 90 86 % 86 %  slowly improved to 89 with rest, continue to assess 92 %   Oxygen Saturation (Exit) 94 % 91 % 98 % 96 % 100 %   Rating of Perceived Exertion (Exercise) _0 Perceived Dyspnea (Exercise) _1 Duration Progress to 45 minutes of aerobic exercise without signs/symptoms of physical distress Progress to 45 minutes of aerobic exercise without signs/symptoms of physical distress Progress to 45 minutes of aerobic exercise without signs/symptoms of physical distress Progress to 45 minutes of aerobic exercise without signs/symptoms of physical distress Progress to 45 minutes of aerobic exercise without signs/symptoms of physical distress   Intensity _2      Progression   Progression Continue to progress workloads to maintain intensity without signs/symptoms of physical distress. Continue to progress workloads to maintain intensity without signs/symptoms of physical distress. Continue to progress workloads to maintain intensity without signs/symptoms of physical distress. Continue to progress workloads to maintain intensity without signs/symptoms of physical distress. Continue to progress workloads to maintain intensity without signs/symptoms of physical distress.     Resistance Training   Training Prescription _3    Weight _4    Reps 10-12  10 minutes of strength training 10-12  10 minutes of strength training 10-12  10 minutes of strength training 10-12  10 minutes of strength training 10-12  10 minutes of strength training     Interval  Training   Interval Training _5      Oxygen   Oxygen _6    Liters _7 Recumbant Bike   Level 4 4.5 5._8 Minutes _9 NuStep   Level _10 Minutes _11 METs 2.6 2.9 2.2 2.2 2.1     Track   Laps 7  - 8  - 3   Minutes 17  - 17  - 17      Exercise Comments:     Exercise Comments    Row Name 08/24/16 1025 09/21/16 1645 10/08/16 1003 11/10/16 0829     Exercise Comments Patient has only attended one session. Will cont. to monitor. Patient is doing well in PR. Open to workload increases. Works hard when he is here. Oxygen saturation is monitored closely. Will cont. to monitor and progress. Patient is doing well in PR. Open to workload increases. Works hard when he is here. Oxygen saturation is monitored closely. Will cont. to monitor and progress. Patient is slowly progressing in rehab. MET levels place him at a low level. Will cont. to monitor and progress.        Discharge Exercise Prescription (Final Exercise Prescription Changes):     Exercise Prescription Changes - 11/03/16 1500  Response to Exercise   Blood Pressure (Admit) 92/50   Blood Pressure (Exercise) 136/70   Blood Pressure (Exit) 94/50   Heart Rate (Admit) 82 bpm   Heart Rate (Exercise) 98 bpm   Heart Rate (Exit) 73 bpm   Oxygen Saturation (Admit) 99 %   Oxygen Saturation (Exercise) 92 %   Oxygen Saturation (Exit) 100 %   Rating of Perceived Exertion (Exercise) 13   Perceived Dyspnea (Exercise) 3   Duration Progress to 45 minutes of aerobic exercise without signs/symptoms of physical distress   Intensity THRR unchanged     Progression   Progression Continue to progress workloads to maintain intensity without signs/symptoms of physical distress.     Resistance Training   Training Prescription Yes   Weight green bands   Reps 10-12  10 minutes of strength training     Interval Training    Interval Training No     Oxygen   Oxygen Continuous   Liters 8     Recumbant Bike   Level 5   Minutes 17     NuStep   Level 5   Minutes 17   METs 2.1     Track   Laps 3   Minutes 17       Nutrition:  Target Goals: Understanding of nutrition guidelines, daily intake of sodium <1542m, cholesterol <2072m calories 30% from fat and 7% or less from saturated fats, daily to have 5 or more servings of fruits and vegetables.  Biometrics:     Pre Biometrics - 08/07/16 1120      Pre Biometrics   Grip Strength 38 kg       Nutrition Therapy Plan and Nutrition Goals:     Nutrition Therapy & Goals - 09/17/16 1554      Nutrition Therapy   Diet High Calorie, High Protein     Personal Nutrition Goals   Personal Goal #1 Wt gain to a wt gain goal of 138-150 lb [facility] and counsel regarding individualized specific dietary modifications aiming towards targeted core components such as weight, hypertension, lipid management, diabetes, heart failure and other comorbidities.   Expected Outcomes Short Term Goal: Understand basic principles of dietary content, such as calories, fat, sodium, cholesterol and nutrients.;Long Term Goal: Adherence to prescribed nutrition plan.      Nutrition Discharge: Rate Your Plate Scores:     Nutrition Assessments - 09/17/16 1554      Rate Your Plate Scores   Pre Score 49  score appropriate due to pt needs to gain wt      Psychosocial: Target Goals: Acknowledge presence or absence of depression, maximize coping skills, provide positive support system. Participant is able to verbalize types and ability to use techniques and skills needed for reducing stress and depression.  Initial Review & Psychosocial Screening:     Initial Psych Review & Screening - 08/07/16 1103      Family Dynamics   Good Support System? Yes     Barriers   Psychosocial barriers to  participate in program There are no identifiable barriers or psychosocial needs.     Screening Interventions   Interventions Encouraged to exercise      Quality of Life Scores:   PHQ-9: Recent Review Flowsheet Data    [facility]   Decreased Interest 0   Down, Depressed, Hopeless 0   PHQ - 2 Score 0  Psychosocial Evaluation and Intervention:     Psychosocial Evaluation - 08/07/16 1103      Psychosocial Evaluation & Interventions   Interventions Encouraged to exercise with the program and follow exercise prescription      Psychosocial Re-Evaluation:     Psychosocial Re-Evaluation    [place] Name 08/24/16 1438 09/14/16 1412 10/05/16 1422 11/09/16 1435       Psychosocial Re-Evaluation   Interventions Encouraged to attend Pulmonary Rehabilitation for the exercise Encouraged to attend Pulmonary Rehabilitation for the exercise Encouraged to attend Pulmonary Rehabilitation for the exercise Encouraged to attend Pulmonary Rehabilitation for the exercise    Comments no psychosocial issues identified at this time No psychosocial issues identified at this time. No psychosocial issues identified No psychosocial issues identified    Continued Psychosocial Services Needed No No No No      Education: Education Goals: Education classes will be provided on a weekly basis, covering required topics. Participant will state understanding/return demonstration of topics presented.  Learning Barriers/Preferences:     Learning Barriers/Preferences - 08/07/16 1101      Learning Barriers/Preferences   Learning Barriers None   Learning Preferences Computer/Internet;Group Instruction;Individual Instruction;Written Material;Verbal Instruction      Education Topics: Risk Factor Reduction:  -Group instruction that is supported by a PowerPoint presentation. Instructor discusses the definition of a risk factor, different risk factors for pulmonary disease, and how the  heart and lungs work together.     Nutrition for Pulmonary Patient:  -Group instruction provided by PowerPoint slides, verbal discussion, and written materials to support subject matter. The instructor gives an explanation and review of healthy diet recommendations, which includes a discussion on weight management, recommendations for fruit and vegetable consumption, as well as protein, fluid, caffeine, fiber, sodium, sugar, and alcohol. Tips for eating when patients are short of breath are discussed. Flowsheet Row PULMONARY REHAB OTHER RESPIRATORY from 10/29/2016 in [place]  Date  10/29/16  Educator  RD  Instruction Review Code  2- meets goals/outcomes      Pursed Lip Breathing:  -Group instruction that is supported by demonstration and informational handouts. Instructor discusses the benefits of pursed lip and diaphragmatic breathing and detailed demonstration on how to preform both.     Oxygen Safety:  -Group instruction provided by PowerPoint, verbal discussion, and written material to support subject matter. There is an overview of "What is Oxygen" and "Why do we need it".  Instructor also reviews how to create a safe environment for oxygen use, the importance of using oxygen as prescribed, and the risks of noncompliance. There is a brief discussion on traveling with oxygen and resources the patient may utilize. Flowsheet Row PULMONARY REHAB OTHER RESPIRATORY from 10/29/2016 in [place]  Date  08/20/16  Educator  rn  Instruction Review Code  2- meets goals/outcomes      Oxygen Equipment:  -Group instruction provided by [employer] Staff utilizing handouts, written materials, and equipment demonstrations. Flowsheet Row PULMONARY REHAB OTHER RESPIRATORY from 10/29/2016 in [place]  Date  09/03/16  Educator  Rep  Instruction Review Code  2- meets goals/outcomes      Signs and  Symptoms:  -Group instruction provided by written material and verbal discussion to support subject matter. Warning signs and symptoms of infection, stroke, and heart attack are reviewed and when to call the physician/911 reinforced. Tips for preventing the spread of infection discussed. Flowsheet Row PULMONARY REHAB OTHER RESPIRATORY from 10/29/2016  in [place]  Date  09/24/16  Educator  rn  Instruction Review Code  2- meets goals/outcomes      Advanced Directives:  -Group instruction provided by verbal instruction and written material to support subject matter. Instructor reviews Advanced Directive laws and proper instruction for filling out document.   Pulmonary Video:  -Group video education that reviews the importance of medication and oxygen compliance, exercise, good nutrition, pulmonary hygiene, and pursed lip and diaphragmatic breathing for the pulmonary patient.   Exercise for the Pulmonary Patient:  -Group instruction that is supported by a PowerPoint presentation. Instructor discusses benefits of exercise, core components of exercise, frequency, duration, and intensity of an exercise routine, importance of utilizing pulse oximetry during exercise, safety while exercising, and options of places to exercise outside of rehab.   Flowsheet Row PULMONARY REHAB OTHER RESPIRATORY from 10/29/2016 in [place]  Date  09/17/16  Educator  EP  Instruction Review Code  2- meets goals/outcomes      Pulmonary Medications:  -Verbally interactive group education provided by instructor with focus on inhaled medications and proper administration.   Anatomy and Physiology of the Respiratory System and Intimacy:  -Group instruction provided by PowerPoint, verbal discussion, and written material to support subject matter. Instructor reviews respiratory cycle and anatomical components of the respiratory system and their functions.  Instructor also reviews differences in obstructive and restrictive respiratory diseases with examples of each. Intimacy, Sex, and Sexuality differences are reviewed with a discussion on how relationships can change when diagnosed with pulmonary disease. Common sexual concerns are reviewed.   Knowledge Questionnaire Score:   Core Components/Risk Factors/Patient Goals at Admission:     Personal Goals and Risk Factors at Admission - 08/07/16 1102      Core Components/Risk Factors/Patient Goals on Admission   Increase Strength and Stamina Yes   Intervention Provide advice, education, support and counseling about physical activity/exercise needs.;Develop an individualized exercise prescription for aerobic and resistive training based on initial evaluation findings, risk stratification, comorbidities and participant's personal goals.   Expected Outcomes Achievement of increased cardiorespiratory fitness and enhanced flexibility, muscular endurance and strength shown through measurements of functional capacity and personal statement of participant.   Improve shortness of breath with ADL's Yes   Intervention Provide education, individualized exercise plan and daily activity instruction to help decrease symptoms of SOB with activities of daily living.   Expected Outcomes Short Term: Achieves a reduction of symptoms when performing activities of daily living.   Develop more efficient breathing techniques such as purse lipped breathing and diaphragmatic breathing; and practicing self-pacing with activity Yes   Intervention Provide education, demonstration and support about specific breathing techniuqes utilized for more efficient breathing. Include techniques such as pursed lipped breathing, diaphragmatic breathing and self-pacing activity.   Expected Outcomes Short Term: Participant will be able to demonstrate and use breathing techniques as needed throughout daily activities.   Increase knowledge of  respiratory medications and ability to use respiratory devices properly  Yes   Intervention Provide education and demonstration as needed of appropriate use of medications, inhalers, and oxygen therapy.   Expected Outcomes Short Term: Achieves understanding of medications use. Understands that oxygen is a medication prescribed by physician. Demonstrates appropriate use of inhaler and oxygen therapy.      Core Components/Risk Factors/Patient Goals Review:      Goals and Risk Factor Review    Row Name 08/24/16 1436 09/14/16 1410 10/05/16 1419 11/09/16 1432  Core Components/Risk Factors/Patient Goals Review   Personal Goals Review Increase Strength and Stamina;Develop more efficient breathing techniques such as purse lipped breathing and diaphragmatic breathing and practicing self-pacing with activity.;Improve shortness of breath with ADL's;Increase knowledge of respiratory medications and ability to use respiratory devices properly. Increase Strength and Stamina;Develop more efficient breathing techniques such as purse lipped breathing and diaphragmatic breathing and practicing self-pacing with activity.;Improve shortness of breath with ADL's;Increase knowledge of respiratory medications and ability to use respiratory devices properly. Increase Strength and Stamina;Develop more efficient breathing techniques such as purse lipped breathing and diaphragmatic breathing and practicing self-pacing with activity.;Improve shortness of breath with ADL's;Increase knowledge of respiratory medications and ability to use respiratory devices properly. Increase Strength and Stamina;Develop more efficient breathing techniques such as purse lipped breathing and diaphragmatic breathing and practicing self-pacing with activity.;Improve shortness of breath with ADL's;Increase knowledge of respiratory medications and ability to use respiratory devices properly.    Review Has only attended 1 exercise session, too early  to have met any goals progressing well, nustep level 4, recumbent bike level 4, 7 laps on track.  Needs 8 liters of oxygen while exercising.   has been sick the last week, equipment has stayed the same, will increase levels when recovered from illness. Becoming more knowledgeable re: oxygen use and how to monitor his oxygen saturations, progressing, purse lip breathing improving    Expected Outcomes expect imrovement in goals in the next 30 days expect his strength and stamina will increase as workloads increase.   continue to progress when recovered to continue meeting goals with time in program       Core Components/Risk Factors/Patient Goals at Discharge (Final Review):      Goals and Risk Factor Review - 11/09/16 1432      Core Components/Risk Factors/Patient Goals Review   Personal Goals Review Increase Strength and Stamina;Develop more efficient breathing techniques such as purse lipped breathing and diaphragmatic breathing and practicing self-pacing with activity.;Improve shortness of breath with ADL's;Increase knowledge of respiratory medications and ability to use respiratory devices properly.   Review Becoming more knowledgeable re: oxygen use and how to monitor his oxygen saturations, progressing, purse lip breathing improving   Expected Outcomes to continue meeting goals with time in program      ITP Comments:   Comments: ITP REVIEW Pt is making expected progress toward pulmonary rehab goals after completing 15 sessions. Recommend continued exercise, life style modification, education, and utilization of breathing techniques to increase stamina and strength and decrease shortness of breath with exertion.

## 2016-11-10 NOTE — Progress Notes (Signed)
Daily Session Note  Patient Details  Name: Gilbert Dominguez MRN: [id number] Date of Birth: 1946-05-13 Referring Provider:   April Manson Pulmonary Rehab Walk Test from 08/11/2016 in [place]  Referring Provider  Dr. Melvyn Novas      Encounter Date: 11/10/2016  Check In:     Session Check In - 11/10/16 1521      Check-In   Location [facility]   Staff Present Rosebud Poles, RN, BSN;Molly diVincenzo, MS, ACSM RCEP, Exercise Physiologist;Eun Vermeer Ysidro Evert, RN;Portia Rollene Rotunda, RN, BSN   Supervising physician immediately available to respond to emergencies [facility] immediately available   Physician(s) Dr. Wyline Copas   Medication changes reported     No   Fall or balance concerns reported    No   Warm-up and Cool-down Performed as group-led instruction   Resistance Training Performed Yes   VAD Patient? No     Pain Assessment   Currently in Pain? No/denies   Multiple Pain Sites No      Capillary Blood Glucose: No results found for this or any previous visit (from the past 24 hour(s)).      Exercise Prescription Changes - 11/10/16 1500      Response to Exercise   Blood Pressure (Admit) 94/60   Blood Pressure (Exercise) 100/70   Blood Pressure (Exit) 96/60   Heart Rate (Admit) 84 bpm   Heart Rate (Exercise) 107 bpm   Heart Rate (Exit) 66 bpm   Oxygen Saturation (Admit) 94 %   Oxygen Saturation (Exercise) 84 %  with rest increased to 88% later sat 86 O2 increased to 10L   Oxygen Saturation (Exit) 100 %   Rating of Perceived Exertion (Exercise) 15   Perceived Dyspnea (Exercise) 2   Duration Progress to 45 minutes of aerobic exercise without signs/symptoms of physical distress   Intensity THRR unchanged     Progression   Progression Continue to progress workloads to maintain intensity without signs/symptoms of physical distress.     Resistance Training   Training Prescription Yes   Weight green bands   Reps 10-12  10 minutes of  strength training     Interval Training   Interval Training No     Oxygen   Oxygen Continuous   Liters 8-10     Recumbant Bike   Level 5   Minutes 17     NuStep   Level 5   Minutes 17   METs 2.2     Track   Laps 8   Minutes 17     Goals Met:  No report of cardiac concerns or symptoms Strength training completed today  Goals Unmet:  Not Applicable  Comments: Service time is from 1030 to [place]    Dr. Rush Farmer is Medical Director for Pulmonary Rehab at [facility].

## 2016-11-12 ENCOUNTER — Encounter (HOSPITAL_COMMUNITY)
Admission: RE | Admit: 2016-11-12 | Discharge: 2016-11-12 | Disposition: A | Discharge status: Home / Self Care | Payer: Medicare Other | Source: Ambulatory Visit | Attending: Internal Medicine | Admitting: Internal Medicine

## 2016-11-12 ENCOUNTER — Other Ambulatory Visit: Payer: Self-pay | Admitting: Internal Medicine

## 2016-11-12 VITALS — Wt 135.8 lb

## 2016-11-12 DIAGNOSIS — J8410 Pulmonary fibrosis, unspecified: Secondary | ICD-10-CM

## 2016-11-12 DIAGNOSIS — J9611 Chronic respiratory failure with hypoxia: Secondary | ICD-10-CM | POA: Diagnosis not present

## 2016-11-12 NOTE — Progress Notes (Signed)
Daily Session Note  Patient Details  Name: Gilbert Dominguez MRN: [id number] Date of Birth: Feb 07, 1946 Referring Provider:   April Manson Pulmonary Rehab Walk Test from 08/11/2016 in [place]  Referring Provider  Dr. Melvyn Novas      Encounter Date: 11/12/2016  Check In:     Session Check In - 11/12/16 1342      Check-In   [facility]   Staff Present Su Hilt, MS, ACSM RCEP, Exercise Physiologist;Lisa Ysidro Evert, RN;Wilmarie Sparlin Rollene Rotunda, RN, BSN   Supervising physician immediately available to respond to emergencies [facility] immediately available   Physician(s) Dr. Algis Liming   Medication changes reported     No   Fall or balance concerns reported    No   Warm-up and Cool-down Performed as group-led instruction   Resistance Training Performed Yes   VAD Patient? No     Pain Assessment   Currently in Pain? No/denies   Multiple Pain Sites No      Capillary Blood Glucose: No results found for this or any previous visit (from the past 24 hour(s)).      Exercise Prescription Changes - 11/12/16 1532      Response to Exercise   Blood Pressure (Admit) 96/61   Blood Pressure (Exercise) 96/60   Blood Pressure (Exit) 96/70   Heart Rate (Admit) 75 bpm   Heart Rate (Exercise) 97 bpm   Heart Rate (Exit) 68 bpm   Oxygen Saturation (Admit) 91 %   Oxygen Saturation (Exercise) 87 %   Oxygen Saturation (Exit) 100 %   Rating of Perceived Exertion (Exercise) 11   Perceived Dyspnea (Exercise) 2   Duration Progress to 45 minutes of aerobic exercise without signs/symptoms of physical distress   Intensity THRR unchanged     Progression   Progression Continue to progress workloads to maintain intensity without signs/symptoms of physical distress.     Resistance Training   Training Prescription Yes   Weight green bands   Reps 10-12  10 minutes of strength training     Interval Training   Interval Training No     Oxygen   Oxygen Continuous   Liters 10     Recumbant Bike   Level 5   Minutes 17     Track   Laps 7   Minutes 17     Goals Met:  [place] with exercise equipment Improved SOB with ADL's Using PLB without cueing & demonstrates good technique Personal goals reviewed No report of cardiac concerns or symptoms Strength training completed today  Goals Unmet:  O2 Sat  Comments: Service time is from 1330 to [place]   Dr. Rush Farmer is Medical Director for Pulmonary Rehab at [facility].

## 2016-11-17 ENCOUNTER — Encounter (HOSPITAL_COMMUNITY)
Admission: RE | Admit: 2016-11-17 | Discharge: 2016-11-17 | Disposition: A | Discharge status: Home / Self Care | Payer: Medicare Other | Source: Ambulatory Visit | Attending: Internal Medicine | Admitting: Internal Medicine

## 2016-11-17 VITALS — Wt 138.9 lb

## 2016-11-17 DIAGNOSIS — J8410 Pulmonary fibrosis, unspecified: Secondary | ICD-10-CM

## 2016-11-17 DIAGNOSIS — J9611 Chronic respiratory failure with hypoxia: Secondary | ICD-10-CM | POA: Diagnosis not present

## 2016-11-17 NOTE — Progress Notes (Signed)
Daily Session Note  Patient Details  Name: Gilbert Dominguez MRN: [id number] Date of Birth: 09-Aug-1946 Referring Provider:   April Manson Pulmonary Rehab Walk Test from 08/11/2016 in [place]  Referring Provider  Dr. Melvyn Novas      Encounter Date: 11/17/2016  Check In:     Session Check In - 11/17/16 1330      Check-In   [facility]   Staff Present Rosebud Poles, RN, BSN;Molly diVincenzo, MS, ACSM RCEP, Exercise Physiologist;Lisa Ysidro Evert, RN   Supervising physician immediately available to respond to emergencies [facility] immediately available   Physician(s) Dr. Posey Pronto   Medication changes reported     No   Fall or balance concerns reported    No   Warm-up and Cool-down Performed as group-led instruction   Resistance Training Performed Yes   VAD Patient? No     Pain Assessment   Currently in Pain? No/denies   Multiple Pain Sites No      Capillary Blood Glucose: No results found for this or any previous visit (from the past 24 hour(s)).      Exercise Prescription Changes - 11/17/16 1519      Response to Exercise   Blood Pressure (Admit) 100/52   Blood Pressure (Exercise) 121/74   Blood Pressure (Exit) 92/54   Heart Rate (Admit) 78 bpm   Heart Rate (Exercise) 97 bpm   Heart Rate (Exit) 67 bpm   Oxygen Saturation (Admit) 100 %   Oxygen Saturation (Exercise) 85 %   Oxygen Saturation (Exit) 100 %   Rating of Perceived Exertion (Exercise) 13   Perceived Dyspnea (Exercise) 2   Duration Progress to 45 minutes of aerobic exercise without signs/symptoms of physical distress   Intensity THRR unchanged     Progression   Progression Continue to progress workloads to maintain intensity without signs/symptoms of physical distress.     Resistance Training   Training Prescription Yes   Weight green bands   Reps 10-12  10 minutes of strength training     Interval Training   Interval Training No     Oxygen   Oxygen Continuous   Liters 10     Recumbant Bike   Level 5   Minutes 17     NuStep   Level 4   Minutes 17   METs 2.2     Track   Laps 5   Minutes 17     Goals Met:  Queuing for purse lip breathing No report of cardiac concerns or symptoms Strength training completed today  Goals Unmet:  O2 Sat  Comments: Service time is from 1330 to 1500   Dr. Rush Farmer is Medical Director for Pulmonary Rehab at [facility].

## 2016-11-19 ENCOUNTER — Encounter (HOSPITAL_COMMUNITY)
Admission: RE | Admit: 2016-11-19 | Discharge: 2016-11-19 | Disposition: A | Discharge status: Home / Self Care | Payer: Medicare Other | Source: Ambulatory Visit | Attending: Internal Medicine | Admitting: Internal Medicine

## 2016-11-19 VITALS — Wt 135.4 lb

## 2016-11-19 DIAGNOSIS — J8410 Pulmonary fibrosis, unspecified: Secondary | ICD-10-CM | POA: Diagnosis not present

## 2016-11-19 DIAGNOSIS — J9611 Chronic respiratory failure with hypoxia: Secondary | ICD-10-CM | POA: Diagnosis present

## 2016-11-19 DIAGNOSIS — R0600 Dyspnea, unspecified: Secondary | ICD-10-CM | POA: Diagnosis not present

## 2016-11-19 NOTE — Progress Notes (Signed)
Daily Session Note  Patient Details  Name: Gilbert Dominguez MRN: [id number] Date of Birth: 02/23/1946 Referring Provider:   Flowsheet Row Pulmonary Rehab Walk Test from 08/11/2016 in [facility]  Referring Provider  Dr. Wert      Encounter Date: 11/19/2016  Check In:     Session Check In - 11/19/16 1351      Check-In   [facility]   Staff Present Molly diVincenzo, MS, ACSM RCEP, Exercise Physiologist;Lisa Hughes, RN   Supervising physician immediately available to respond to emergencies [facility] immediately available   Physician(s) Dr. Joseph   Medication changes reported     No   Fall or balance concerns reported    No   Warm-up and Cool-down Performed as group-led instruction   Resistance Training Performed Yes   VAD Patient? No     Pain Assessment   Currently in Pain? No/denies   Multiple Pain Sites No      Capillary Blood Glucose: No results found for this or any previous visit (from the past 24 hour(s)).      Exercise Prescription Changes - 11/19/16 1604      Response to Exercise   Blood Pressure (Admit) 100/50   Blood Pressure (Exercise) 103/65   Blood Pressure (Exit) 98/60   Heart Rate (Admit) 80 bpm   Heart Rate (Exercise) 105 bpm   Heart Rate (Exit) 68 bpm   Oxygen Saturation (Admit) 93 %   Oxygen Saturation (Exercise) 88 %   Oxygen Saturation (Exit) 100 %   Rating of Perceived Exertion (Exercise) 13   Perceived Dyspnea (Exercise) 3   Duration Progress to 45 minutes of aerobic exercise without signs/symptoms of physical distress   Intensity THRR unchanged     Progression   Progression Continue to progress workloads to maintain intensity without signs/symptoms of physical distress.     Resistance Training   Training Prescription Yes   Weight green bands   Reps 10-12  10 minutes of strength training     Interval Training   Interval Training No     Oxygen   Oxygen Continuous   Liters 10     Recumbant Bike   Level 5   Minutes 17     Track   Laps 5   Minutes 17     Goals Met:  Exercise tolerated well Queuing for purse lip breathing No report of cardiac concerns or symptoms Strength training completed today  Goals Unmet:  Not Applicable  Comments: Service time is from 1330 to 1530   Dr. Wesam G. Yacoub is Medical Director for Pulmonary Rehab at [facility]. 

## 2016-11-24 ENCOUNTER — Encounter (HOSPITAL_COMMUNITY)
Admission: RE | Admit: 2016-11-24 | Discharge: 2016-11-24 | Disposition: A | Discharge status: Home / Self Care | Payer: Medicare Other | Source: Ambulatory Visit | Attending: Internal Medicine | Admitting: Internal Medicine

## 2016-11-24 VITALS — Wt 137.8 lb

## 2016-11-24 DIAGNOSIS — J9611 Chronic respiratory failure with hypoxia: Secondary | ICD-10-CM | POA: Diagnosis not present

## 2016-11-24 DIAGNOSIS — J8410 Pulmonary fibrosis, unspecified: Secondary | ICD-10-CM

## 2016-11-24 NOTE — Progress Notes (Signed)
Daily Session Note  Patient Details  Name: Gilbert Dominguez MRN: [id number] Date of Birth: 1946/05/23 Referring Provider:   April Manson Pulmonary Rehab Walk Test from 08/11/2016 in [place]  Referring Provider  Dr. Melvyn Novas      Encounter Date: 11/24/2016  Check In:     Session Check In - 11/24/16 1327      Check-In   [facility]   Staff Present Rosebud Poles, RN, BSN;Molly diVincenzo, MS, ACSM RCEP, Exercise Physiologist   Supervising physician immediately available to respond to emergencies [facility] immediately available   Physician(s) Dr. Cathlean Sauer   Medication changes reported     No   Fall or balance concerns reported    No   Warm-up and Cool-down Performed as group-led instruction   Resistance Training Performed Yes   VAD Patient? No     Pain Assessment   Currently in Pain? No/denies   Multiple Pain Sites No      Capillary Blood Glucose: No results found for this or any previous visit (from the past 24 hour(s)).      Exercise Prescription Changes - 11/24/16 1500      Response to Exercise   Blood Pressure (Admit) 98/62   Blood Pressure (Exercise) 100/62   Blood Pressure (Exit) 92/52   Heart Rate (Admit) 78 bpm   Heart Rate (Exercise) 98 bpm   Heart Rate (Exit) 70 bpm   Oxygen Saturation (Admit) 98 %   Oxygen Saturation (Exercise) 91 %   Oxygen Saturation (Exit) 100 %   Rating of Perceived Exertion (Exercise) 13   Perceived Dyspnea (Exercise) 2   Duration Progress to 45 minutes of aerobic exercise without signs/symptoms of physical distress   Intensity THRR unchanged     Progression   Progression Continue to progress workloads to maintain intensity without signs/symptoms of physical distress.     Resistance Training   Training Prescription Yes   Weight green bands   Reps 10-12  10 minutes of strength training     Interval Training   Interval Training No     Oxygen   Oxygen Continuous    Liters 8     Recumbant Bike   Level 5   Minutes 17     NuStep   Level 5   Minutes 17   METs 2     Track   Laps 4   Minutes 17     Goals Met:  Exercise tolerated well No report of cardiac concerns or symptoms Strength training completed today  Goals Unmet:  Not Applicable  Comments: Service time is from 1330 to 1500    Dr. Rush Farmer is Medical Director for Pulmonary Rehab at [facility].

## 2016-11-26 ENCOUNTER — Encounter (HOSPITAL_COMMUNITY)
Admission: RE | Admit: 2016-11-26 | Discharge: 2016-11-26 | Disposition: A | Discharge status: Home / Self Care | Payer: Medicare Other | Source: Ambulatory Visit | Attending: Internal Medicine | Admitting: Internal Medicine

## 2016-11-26 VITALS — Wt 138.2 lb

## 2016-11-26 DIAGNOSIS — J9611 Chronic respiratory failure with hypoxia: Secondary | ICD-10-CM | POA: Diagnosis not present

## 2016-11-26 DIAGNOSIS — J8410 Pulmonary fibrosis, unspecified: Secondary | ICD-10-CM

## 2016-11-26 NOTE — Progress Notes (Signed)
Daily Session Note  Patient Details  Name: Gilbert Dominguez MRN: [id number] Date of Birth: 10-05-1946 Referring Provider:   April Manson Pulmonary Rehab Walk Test from 08/11/2016 in [place]  Referring Provider  Dr. Melvyn Novas      Encounter Date: 11/26/2016  Check In:     Session Check In - 11/26/16 1330      Check-In   Location [facility]   Staff Present Rosebud Poles, RN, BSN;Molly diVincenzo, MS, ACSM RCEP, Exercise Physiologist   Supervising physician immediately available to respond to emergencies [facility] immediately available   Physician(s) Dr. Algis Liming   Medication changes reported     No   Fall or balance concerns reported    No   Warm-up and Cool-down Performed as group-led instruction   Resistance Training Performed Yes   VAD Patient? No     Pain Assessment   Currently in Pain? No/denies   Multiple Pain Sites No      Capillary Blood Glucose: No results found for this or any previous visit (from the past 24 hour(s)).      Exercise Prescription Changes - 11/26/16 1539      Response to Exercise   Blood Pressure (Admit) 104/60   Blood Pressure (Exercise) 112/60   Blood Pressure (Exit) 116/78   Heart Rate (Admit) 77 bpm   Heart Rate (Exercise) 95 bpm   Heart Rate (Exit) 67 bpm   Oxygen Saturation (Admit) 93 %   Oxygen Saturation (Exercise) 86 %   Oxygen Saturation (Exit) 100 %   Rating of Perceived Exertion (Exercise) 13   Perceived Dyspnea (Exercise) 2   Duration Progress to 45 minutes of aerobic exercise without signs/symptoms of physical distress   Intensity THRR unchanged     Progression   Progression Continue to progress workloads to maintain intensity without signs/symptoms of physical distress.     Resistance Training   Training Prescription Yes   Weight green bands   Reps 10-12  10 minutes of strength training     Interval Training   Interval Training No     Oxygen   Oxygen  Continuous   Liters 8     Recumbant Bike   Level 5   Minutes 17     Track   Laps 5   Minutes 17     Goals Met:  Improved SOB with ADL's Using PLB without cueing & demonstrates good technique Exercise tolerated well No report of cardiac concerns or symptoms Strength training completed today  Goals Unmet:  O2 Sat  Comments: Service time is from 1330 to 1530, patient also attended an Q and A session with the medical director.   Dr. Rush Farmer is Medical Director for Pulmonary Rehab at [facility].

## 2016-12-01 ENCOUNTER — Encounter (HOSPITAL_COMMUNITY)
Admission: RE | Admit: 2016-12-01 | Discharge: 2016-12-01 | Disposition: A | Discharge status: Home / Self Care | Payer: Medicare Other | Source: Ambulatory Visit | Attending: Internal Medicine | Admitting: Internal Medicine

## 2016-12-01 DIAGNOSIS — J8410 Pulmonary fibrosis, unspecified: Secondary | ICD-10-CM

## 2016-12-01 DIAGNOSIS — J9611 Chronic respiratory failure with hypoxia: Secondary | ICD-10-CM | POA: Diagnosis not present

## 2016-12-01 NOTE — Progress Notes (Signed)
Tab came in to exercise in pulmonary rehab today and his weight is up 10 pounds from what he normally weighs.  His ankles have 3 + edema, lungs with crackles from bases half way up bilaterally, he has pulmonary fibrosis.  BP 120/68, heart rhythm 82 regular, spo2 90% on 4L.  I called Dr. Arelia Sneddon and he requested Gilbert Dominguez should take his Lasix 20 mg daily instead of every other day.  He also stopped valsartan last week due to low blood pressure readings.  He is to call Dr. Arelia Sneddon at the end of the week if he does not lose the added weight.  Gilbert Dominguez voiced verbal understanding.  He did not exercise today.  He should not exercise with [place] until his weight is back to normal.

## 2016-12-03 ENCOUNTER — Encounter (HOSPITAL_COMMUNITY)
Admission: RE | Admit: 2016-12-03 | Discharge: 2016-12-03 | Disposition: A | Discharge status: Home / Self Care | Payer: Medicare Other | Source: Ambulatory Visit | Attending: Internal Medicine | Admitting: Internal Medicine

## 2016-12-03 DIAGNOSIS — J8410 Pulmonary fibrosis, unspecified: Secondary | ICD-10-CM

## 2016-12-03 NOTE — Progress Notes (Signed)
Pulmonary Individual Treatment Plan  Patient Details  Name: Gilbert Dominguez MRN: [id number] Date of Birth: [birth year] Referring Provider:   April Manson Pulmonary Rehab Walk Test from 08/11/2016 in [place]  Referring Provider  Dr. Melvyn Novas      Initial Encounter Date:  Flowsheet Row Pulmonary Rehab Walk Test from 08/11/2016 in [place]  Date  08/11/16  Referring Provider  Dr. 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  alendronate (FOSAMAX) 70 MG tablet, Take 70 mg by mouth once a week. Take with a full glass of water on an empty stomach., Disp: , Rfl:  .  aspirin 81 MG tablet, Take 81 mg by mouth daily., Disp: , Rfl:  .  Calcium Carbonate-Vit D-Min (CALCIUM 600+D PLUS MINERALS PO), Take 1 tablet by mouth 2 (two) times daily., Disp: , Rfl:  .  chlorpheniramine (CHLOR-TRIMETON) 4 MG tablet, Take 4 mg by mouth 2 (two) times daily as needed for allergies., Disp: , Rfl:  .  famotidine (PEPCID) 20 MG tablet, TAKE 1 TABLET BY MOUTH AT BEDTIME, Disp: 30 tablet, Rfl: 2 .  furosemide (LASIX) 20 MG tablet, Take 20 mg by mouth every other day., Disp: , Rfl:  .  loratadine (CLARITIN) 10 MG tablet, Take 10 mg by mouth daily., Disp: , Rfl:  .  OXYGEN, 2 lpm with sleep and as needed with exertion, Disp: , Rfl:  .  pantoprazole (PROTONIX) 40 MG tablet, TAKE 1 TABLET BY MOUTH EVERY DAY *TAKE 30-60 MINUTES BEFORE FIRST MEAL OF THE DAY*, Disp: 30 tablet, Rfl: 11 .  predniSONE (DELTASONE) 10 MG tablet, TAKE 2 DAILY UNTIL BETTER THEN ONE DAILY UNTIL RETURN, Disp: 60 tablet, Rfl: 2 .  Pseudoephedrine-Guaifenesin (MUCINEX D MAX STRENGTH) [phone] MG TB12, Take by mouth., Disp: , Rfl:  .  tadalafil (CIALIS) 20 MG tablet, Take 20 mg by mouth daily as needed for erectile dysfunction., Disp: , Rfl:  .  terazosin (HYTRIN) 1 MG capsule,  Take 2 mg by mouth at bedtime. , Disp: , Rfl:  .  valsartan-hydrochlorothiazide (DIOVAN HCT) 160-12.5 MG tablet, Take 1 tablet by mouth daily. (Patient taking differently: Take 0.5 tablets by mouth daily. ), Disp: 30 tablet, Rfl: 11  Past Medical History: Past Medical History:  Diagnosis Date  . Chronic respiratory failure ([place])   . Hypertension   . 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 Rheumatoid arthritis(714.0)     Tobacco Use: History  Smoking Status  . Former Smoker  . Packs/day: 1.00  . Years: 36.00  . Types: Cigarettes  . Quit date: 10/14/1995  Smokeless Tobacco  . Not on file    Labs: Recent Review Flowsheet Data    There is no flowsheet data to display.      Capillary Blood Glucose: No results found for: GLUCAP   ADL UCSD:   Pulmonary Function Assessment:     Pulmonary Function Assessment - 08/07/16 1101      Breath   Bilateral Breath Sounds Other   Other course crackles in bases bilat   Shortness of Breath Yes;Limiting activity      Exercise Target Goals:    Exercise Program Goal: Individual exercise prescription set with THRR, safety & activity barriers. Participant demonstrates ability to understand and report RPE using BORG scale, to self-measure pulse accurately, and to acknowledge the importance of the exercise prescription.  Exercise Prescription  Goal: Starting with aerobic activity 30 plus minutes a day, 3 days per week for initial exercise prescription. Provide home exercise prescription and guidelines that participant acknowledges understanding prior to discharge.  Activity Barriers & Risk Stratification:     Activity Barriers & Cardiac Risk Stratification - 08/07/16 1100      Activity Barriers & Cardiac Risk Stratification   Activity Barriers Shortness of Breath;Deconditioning      6 Minute Walk:     6 Minute Walk    Row Name 08/11/16 1625         6 Minute Walk   Phase Initial     Distance 600 feet     Walk  Time -  4 minutes and 20 seconds total     # of Rest Breaks 2  first rest break 1 minute and 30 seconds --second rest break lasted 10 seconds     RPE 12     Perceived Dyspnea  2     Symptoms Yes (comment)     Comments dizzy when oxygen saturation dropped     Resting HR 82 bpm     Resting BP 95/60     Max Ex. HR 97 bpm     Max Ex. BP 104/63  BP 83/49 at 6 minutes     2 Minute Post BP 91/58       Interval HR   Baseline HR 82     1 Minute HR 89     2 Minute HR 92     3 Minute HR 90     4 Minute HR 94     5 Minute HR 95     6 Minute HR 97     2 Minute Post HR 93     Interval Heart Rate? Yes       Interval Oxygen   Interval Oxygen? Yes     Baseline Oxygen Saturation % 94 %     Baseline Liters of Oxygen 2 L     1 Minute Oxygen Saturation % 85 %     1 Minute Liters of Oxygen 2 L     2 Minute Oxygen Saturation % 80 %  stopped     2 Minute Liters of Oxygen 4 L     3 Minute Oxygen Saturation % 84 %     3 Minute Liters of Oxygen 6 L     4 Minute Oxygen Saturation % 88 %     4 Minute Liters of Oxygen 6 L     5 Minute Oxygen Saturation % 90 %     5 Minute Liters of Oxygen 6 L     6 Minute Oxygen Saturation % 88 %     6 Minute Liters of Oxygen 6 L     2 Minute Post Oxygen Saturation % 88 %     2 Minute Post Liters of Oxygen 6 L        Initial Exercise Prescription:     Initial Exercise Prescription - 08/11/16 1600      Date of Initial Exercise RX and Referring Provider   Date 08/11/16   Referring Provider Dr. Melvyn Novas     Oxygen   Oxygen Continuous   Liters 6     Recumbant Bike   Level 2   Minutes 17     NuStep   Level 2   Minutes 17   METs 1.5     Track   Laps 5   Minutes 17  Prescription Details   Frequency (times per week) 2   Duration Progress to 45 minutes of aerobic exercise without signs/symptoms of physical distress     Intensity   THRR 40-80% of Max Heartrate 59-118   Ratings of Perceived Exertion 11-13   Perceived Dyspnea 0-4      Progression   Progression Continue progressive overload as per policy without signs/symptoms or physical distress.     Resistance Training   Training Prescription Yes   Weight green bands   Reps 10-12      Perform Capillary Blood Glucose checks as needed.  Exercise Prescription Changes:     Exercise Prescription Changes    Row Name 08/20/16 1600 08/25/16 1500 08/27/16 1600 09/01/16 1500 09/03/16 1500     Exercise Review   Progression  - Yes  - Yes  -     Response to Exercise   Blood Pressure (Admit) 114/50 110/46 90/40 100/50 106/60   Blood Pressure (Exercise) 120/60 104/60 1_0   Blood Pressure (Exit) 106/64 106/64 104/62 100/60 98/64   Heart Rate (Admit) 73 bpm 81 bpm 84 bpm 89 bpm 70 bpm   Heart Rate (Exercise) 79 bpm 103 bpm 102 bpm 97 bpm 72 bpm   Heart Rate (Exit) 63 bpm 92 bpm 70 bpm 75 bpm 61 bpm   Oxygen Saturation (Admit) 89 % 92 % 91 % 98 % 98 %   Oxygen Saturation (Exercise) 86 %  O2 increased to 8L with exercise sat improved to 93. 91 % 90 % 90 % 94 %   Oxygen Saturation (Exit) 98 % 100 % 100 % 95 % 94 %   Rating of Perceived Exertion (Exercise) _1 Perceived Dyspnea (Exercise) _2 Duration Progress to 45 minutes of aerobic exercise without signs/symptoms of physical distress Progress to 45 minutes of aerobic exercise without signs/symptoms of physical distress Progress to 45 minutes of aerobic exercise without signs/symptoms of physical distress Progress to 45 minutes of aerobic exercise without signs/symptoms of physical distress Progress to 45 minutes of aerobic exercise without signs/symptoms of physical distress   Intensity Other (comment)  40-80% of HRR THRR unchanged THRR unchanged THRR unchanged THRR unchanged     Progression   Progression Continue to progress workloads to maintain intensity without signs/symptoms of physical distress. Continue to progress workloads to maintain intensity without signs/symptoms of physical  distress. Continue to progress workloads to maintain intensity without signs/symptoms of physical distress. Continue to progress workloads to maintain intensity without signs/symptoms of physical distress. Continue to progress workloads to maintain intensity without signs/symptoms of physical distress.     Resistance Training   Training Prescription _3    Weight _4    Reps 10-12  10 minutes of strength training 10-12  10 minutes of strength training 10-12  10 minutes of strength training 10-12  10 minutes of strength training 10-12  10 minutes of strength training     Interval Training   Interval Training _5      Oxygen   Oxygen _6    Liters _7 Recumbant Bike   Level  - 2  - 3 3   Minutes  - 17  - 17 17     NuStep   Level 2 3  - 4 4   Minutes 17 17  -  17 17   METs 2.3  -  - 2.3 1.8     Track   Laps _0 -   Minutes 17 17 34 17  -   Row Name 09/08/16 1552 09/15/16 1600 09/17/16 1600 09/22/16 1500 09/24/16 1548     Exercise Review   Progression Yes  -  -  -  -     Response to Exercise   Blood Pressure (Admit) 98/50 96/50 1_1   Blood Pressure (Exercise) 99/69 100/50 100/68 94/50 109/70   Blood Pressure (Exit) 100/60 1_2 92/62   Heart Rate (Admit) 85 bpm 93 bpm 72 bpm 80 bpm 76 bpm   Heart Rate (Exercise) 91 bpm 97 bpm 90 bpm 90 bpm 82 bpm   Heart Rate (Exit) 77 bpm 73 bpm 69 bpm 89 bpm 88 bpm   Oxygen Saturation (Admit) 91 % 92 % 99 % 89 % 92 %   Oxygen Saturation (Exercise) 88 % 91 % 87 % 90 % 92 %   Oxygen Saturation (Exit) 100 % 99 % 96 % 100 % 95 %   Rating of Perceived Exertion (Exercise) _3 Perceived Dyspnea (Exercise) _4 Duration Progress to 45 minutes of aerobic exercise without signs/symptoms of physical distress Progress to 45 minutes of aerobic exercise without  signs/symptoms of physical distress Progress to 45 minutes of aerobic exercise without signs/symptoms of physical distress Progress to 45 minutes of aerobic exercise without signs/symptoms of physical distress Progress to 45 minutes of aerobic exercise without signs/symptoms of physical distress   Intensity _5      Progression   Progression Continue to progress workloads to maintain intensity without signs/symptoms of physical distress. Continue to progress workloads to maintain intensity without signs/symptoms of physical distress. Continue to progress workloads to maintain intensity without signs/symptoms of physical distress. Continue to progress workloads to maintain intensity without signs/symptoms of physical distress. Continue to progress workloads to maintain intensity without signs/symptoms of physical distress.     Resistance Training   Training Prescription _6    Weight _7    Reps 10-12  10 minutes of strength training 10-12  10 minutes of strength training 10-12  10 minutes of strength training 10-12  10 minutes of strength training 10-12  10 minutes of strength training     Interval Training   Interval Training _8      Oxygen   Oxygen _9    Liters _10 Recumbant Bike   Level 4 4  - 4  -   Minutes 17 17  - 17  -     NuStep   Level _11 Minutes _12 METs 2.2 2.4  - 2.4 2.6     Track   Laps _13 Minutes _14 [place] Name 10/06/16 1600 10/08/16 1605 10/20/16 1500 10/29/16 1500 11/03/16 1500     Exercise Review   Progression Yes  - Yes  -  -     Response to Exercise   Blood Pressure (Admit) _15 104/60 92/50   Blood Pressure (Exercise) 96/50 102/50 94/52 100/70 136/70  Blood Pressure (Exit) 98/58 96/64 106/60  112/60 94/50   Heart Rate (Admit) 76 bpm 75 bpm 91 bpm 90 bpm 82 bpm   Heart Rate (Exercise) 106 bpm 97 bpm 102 bpm 106 bpm 98 bpm   Heart Rate (Exit) 64 bpm 70 bpm 72 bpm 68 bpm 73 bpm   Oxygen Saturation (Admit) 93 % 97 % 90 % 93 % 99 %   Oxygen Saturation (Exercise) 87 % 84 %  increased 90 86 % 86 %  slowly improved to 89 with rest, continue to assess 92 %   Oxygen Saturation (Exit) 94 % 91 % 98 % 96 % 100 %   Rating of Perceived Exertion (Exercise) _0 Perceived Dyspnea (Exercise) _1 Duration Progress to 45 minutes of aerobic exercise without signs/symptoms of physical distress Progress to 45 minutes of aerobic exercise without signs/symptoms of physical distress Progress to 45 minutes of aerobic exercise without signs/symptoms of physical distress Progress to 45 minutes of aerobic exercise without signs/symptoms of physical distress Progress to 45 minutes of aerobic exercise without signs/symptoms of physical distress   Intensity _2      Progression   Progression Continue to progress workloads to maintain intensity without signs/symptoms of physical distress. Continue to progress workloads to maintain intensity without signs/symptoms of physical distress. Continue to progress workloads to maintain intensity without signs/symptoms of physical distress. Continue to progress workloads to maintain intensity without signs/symptoms of physical distress. Continue to progress workloads to maintain intensity without signs/symptoms of physical distress.     Resistance Training   Training Prescription _3    Weight _4    Reps 10-12  10 minutes of strength training 10-12  10 minutes of strength training 10-12  10 minutes of strength training 10-12  10 minutes of strength training 10-12  10 minutes of strength training     Interval  Training   Interval Training _5      Oxygen   Oxygen _6    Liters _7 Recumbant Bike   Level 4 4.5 5._8 Minutes _9 NuStep   Level _10 Minutes _11 METs 2.6 2.9 2.2 2.2 2.1     Track   Laps 7  - 8  - 3   Minutes 17  - 17  - 17   Row Name 11/10/16 1500 11/12/16 1532 11/17/16 1519 11/19/16 1604 11/24/16 1500     Response to Exercise   Blood Pressure (Admit) 94/60 96/61 100/52 100/50 98/62   Blood Pressure (Exercise) 100/70 96/60 121/74 103/65 100/62   Blood Pressure (Exit) _12 98/60 92/52   Heart Rate (Admit) 84 bpm 75 bpm 78 bpm 80 bpm 78 bpm   Heart Rate (Exercise) 107 bpm 97 bpm 97 bpm 105 bpm 98 bpm   Heart Rate (Exit) 66 bpm 68 bpm 67 bpm 68 bpm 70 bpm   Oxygen Saturation (Admit) 94 % 91 % 100 % 93 % 98 %   Oxygen Saturation (Exercise) 84 %  with rest increased to 88% later sat 86 O2 increased to 10L 87 % 85 % 88 % 91 %   Oxygen Saturation (  Exit) 100 % 100 % 100 % 100 % 100 %   Rating of Perceived Exertion (Exercise) _0 Perceived Dyspnea (Exercise) _1 Duration Progress to 45 minutes of aerobic exercise without signs/symptoms of physical distress Progress to 45 minutes of aerobic exercise without signs/symptoms of physical distress Progress to 45 minutes of aerobic exercise without signs/symptoms of physical distress Progress to 45 minutes of aerobic exercise without signs/symptoms of physical distress Progress to 45 minutes of aerobic exercise without signs/symptoms of physical distress   Intensity _2      Progression   Progression Continue to progress workloads to maintain intensity without signs/symptoms of physical distress. Continue to progress workloads to maintain intensity without signs/symptoms of physical distress. Continue to progress workloads to  maintain intensity without signs/symptoms of physical distress. Continue to progress workloads to maintain intensity without signs/symptoms of physical distress. Continue to progress workloads to maintain intensity without signs/symptoms of physical distress.     Resistance Training   Training Prescription _3    Weight _4    Reps 10-12  10 minutes of strength training 10-12  10 minutes of strength training 10-12  10 minutes of strength training 10-12  10 minutes of strength training 10-12  10 minutes of strength training     Interval Training   Interval Training _5      Oxygen   Oxygen _6    Liters 8-_7 Recumbant Bike   Level _8 Minutes _9 NuStep   Level 5  - 4  - 5   Minutes 17  - 17  - 17   METs 2.2  - 2.2  - 2     Track   Laps _10 Minutes _11 Row Name 11/26/16 1539             Response to Exercise   Blood Pressure (Admit) 104/60       Blood Pressure (Exercise) 112/60       Blood Pressure (Exit) 116/78       Heart Rate (Admit) 77 bpm       Heart Rate (Exercise) 95 bpm       Heart Rate (Exit) 67 bpm       Oxygen Saturation (Admit) 93 %       Oxygen Saturation (Exercise) 86 %       Oxygen Saturation (Exit) 100 %       Rating of Perceived Exertion (Exercise) 13       Perceived Dyspnea (Exercise) 2       Duration Progress to 45 minutes of aerobic exercise without signs/symptoms of physical distress       Intensity THRR unchanged         Progression   Progression Continue to progress workloads to maintain intensity without signs/symptoms of physical distress.         Resistance Training   Training Prescription Yes       Weight green bands       Reps 10-12  10 minutes of strength training         Interval Training   Interval Training No  Oxygen   Oxygen  Continuous       Liters 8         Recumbant Bike   Level 5       Minutes 17         Track   Laps 5       Minutes 17          Exercise Comments:     Exercise Comments    Row Name 08/24/16 1025 09/21/16 1645 10/08/16 1003 11/10/16 0829 11/30/16 1613   Exercise Comments Patient has only attended one session. Will cont. to monitor. Patient is doing well in PR. Open to workload increases. Works hard when he is here. Oxygen saturation is monitored closely. Will cont. to monitor and progress. Patient is doing well in PR. Open to workload increases. Works hard when he is here. Oxygen saturation is monitored closely. Will cont. to monitor and progress. Patient is slowly progressing in rehab. MET levels place him at a low level. Will cont. to monitor and progress.  Patient is slowly progressing in rehab. MET levels place him at a low level. Keeping oxygen saturations up has been a problem--now on 10 liters with most exercise. Will cont. to monitor and progress.       Discharge Exercise Prescription (Final Exercise Prescription Changes):     Exercise Prescription Changes - 11/26/16 1539      Response to Exercise   Blood Pressure (Admit) 104/60   Blood Pressure (Exercise) 112/60   Blood Pressure (Exit) 116/78   Heart Rate (Admit) 77 bpm   Heart Rate (Exercise) 95 bpm   Heart Rate (Exit) 67 bpm   Oxygen Saturation (Admit) 93 %   Oxygen Saturation (Exercise) 86 %   Oxygen Saturation (Exit) 100 %   Rating of Perceived Exertion (Exercise) 13   Perceived Dyspnea (Exercise) 2   Duration Progress to 45 minutes of aerobic exercise without signs/symptoms of physical distress   Intensity THRR unchanged     Progression   Progression Continue to progress workloads to maintain intensity without signs/symptoms of physical distress.     Resistance Training   Training Prescription Yes   Weight green bands   Reps 10-12  10 minutes of strength training     Interval Training   Interval Training  No     Oxygen   Oxygen Continuous   Liters 8     Recumbant Bike   Level 5   Minutes 17     Track   Laps 5   Minutes 17       Nutrition:  Target Goals: Understanding of nutrition guidelines, daily intake of sodium <1544m, cholesterol <2074m calories 30% from fat and 7% or less from saturated fats, daily to have 5 or more servings of fruits and vegetables.  Biometrics:     Pre Biometrics - 08/07/16 1120      Pre Biometrics   Grip Strength 38 kg       Nutrition Therapy Plan and Nutrition Goals:     Nutrition Therapy & Goals - 09/17/16 1554      Nutrition Therapy   Diet High Calorie, High Protein     Personal Nutrition Goals   Personal Goal #1 Wt gain to a wt gain goal of 138-150 lb [facility] regarding individualized specific dietary modifications aiming towards targeted core components such as weight, hypertension, lipid management, diabetes, heart failure and other  comorbidities.   Expected Outcomes Short Term Goal: Understand basic principles of dietary content, such as calories, fat, sodium, cholesterol and nutrients.;Long Term Goal: Adherence to prescribed nutrition plan.      Nutrition Discharge: Rate Your Plate Scores:     Nutrition Assessments - 09/17/16 1554      Rate Your Plate Scores   Pre Score 49  score appropriate due to pt needs to gain wt      Psychosocial: Target Goals: Acknowledge presence or absence of depression, maximize coping skills, provide positive support system. Participant is able to verbalize types and ability to use techniques and skills needed for reducing stress and depression.  Initial Review & Psychosocial Screening:     Initial Psych Review & Screening - 08/07/16 1103      Family Dynamics   Good Support System? Yes     Barriers   Psychosocial barriers to participate in program There are no identifiable barriers or  psychosocial needs.     Screening Interventions   Interventions Encouraged to exercise      Quality of Life Scores:   PHQ-9: Recent Review Flowsheet Data    Depression screen [facility] 2/9 08/07/2016   Decreased Interest 0   Down, Depressed, Hopeless 0   PHQ - 2 Score 0      Psychosocial Evaluation and Intervention:     Psychosocial Evaluation - 08/07/16 1103      Psychosocial Evaluation & Interventions   Interventions Encouraged to exercise with the program and follow exercise prescription      Psychosocial Re-Evaluation:     Psychosocial Re-Evaluation    Excel Name 08/24/16 1438 09/14/16 1412 10/05/16 1422 11/09/16 1435 12/01/16 0902     Psychosocial Re-Evaluation   Interventions Encouraged to attend Pulmonary Rehabilitation for the exercise Encouraged to attend Pulmonary Rehabilitation for the exercise Encouraged to attend Pulmonary Rehabilitation for the exercise Encouraged to attend Pulmonary Rehabilitation for the exercise Encouraged to attend Pulmonary Rehabilitation for the exercise   Comments no psychosocial issues identified at this time No psychosocial issues identified at this time. No psychosocial issues identified No psychosocial issues identified no psychosocial issues have been identified   Continued Psychosocial Services Needed _0      Education: Education Goals: Education classes will be provided on a weekly basis, covering required topics. Participant will state understanding/return demonstration of topics presented.  Learning Barriers/Preferences:     Learning Barriers/Preferences - 08/07/16 1101      Learning Barriers/Preferences   Learning Barriers None   Learning Preferences Computer/Internet;Group Instruction;Individual Instruction;Written Material;Verbal Instruction      Education Topics: Risk Factor Reduction:  -Group instruction that is supported by a PowerPoint presentation. Instructor discusses the definition of a risk factor,  different risk factors for pulmonary disease, and how the heart and lungs work together.     Nutrition for Pulmonary Patient:  -Group instruction provided by PowerPoint slides, verbal discussion, and written materials to support subject matter. The instructor gives an explanation and review of healthy diet recommendations, which includes a discussion on weight management, recommendations for fruit and vegetable consumption, as well as protein, fluid, caffeine, fiber, sodium, sugar, and alcohol. Tips for eating when patients are short of breath are discussed. Flowsheet Row PULMONARY REHAB OTHER RESPIRATORY from 11/19/2016 in [place]  Date  10/29/16  Educator  RD  Instruction Review Code  2- meets goals/outcomes      Pursed Lip Breathing:  -Group instruction that is supported by demonstration and  informational handouts. Instructor discusses the benefits of pursed lip and diaphragmatic breathing and detailed demonstration on how to preform both.     Oxygen Safety:  -Group instruction provided by PowerPoint, verbal discussion, and written material to support subject matter. There is an overview of "What is Oxygen" and "Why do we need it".  Instructor also reviews how to create a safe environment for oxygen use, the importance of using oxygen as prescribed, and the risks of noncompliance. There is a brief discussion on traveling with oxygen and resources the patient may utilize. Flowsheet Row PULMONARY REHAB OTHER RESPIRATORY from 11/19/2016 in [place]  Date  08/20/16  Educator  rn  Instruction Review Code  2- meets goals/outcomes      Oxygen Equipment:  -Group instruction provided by [employer] Staff utilizing handouts, written materials, and equipment demonstrations. Flowsheet Row PULMONARY REHAB OTHER RESPIRATORY from 11/19/2016 in [place]  Date  09/03/16  Educator  Rep  Instruction Review  Code  2- meets goals/outcomes      Signs and Symptoms:  -Group instruction provided by written material and verbal discussion to support subject matter. Warning signs and symptoms of infection, stroke, and heart attack are reviewed and when to call the physician/911 reinforced. Tips for preventing the spread of infection discussed. Flowsheet Row PULMONARY REHAB OTHER RESPIRATORY from 11/19/2016 in [place]  Date  11/19/16  Educator  rn  Instruction Review Code  2- meets goals/outcomes      Advanced Directives:  -Group instruction provided by verbal instruction and written material to support subject matter. Instructor reviews Advanced Directive laws and proper instruction for filling out document.   Pulmonary Video:  -Group video education that reviews the importance of medication and oxygen compliance, exercise, good nutrition, pulmonary hygiene, and pursed lip and diaphragmatic breathing for the pulmonary patient.   Exercise for the Pulmonary Patient:  -Group instruction that is supported by a PowerPoint presentation. Instructor discusses benefits of exercise, core components of exercise, frequency, duration, and intensity of an exercise routine, importance of utilizing pulse oximetry during exercise, safety while exercising, and options of places to exercise outside of rehab.   Flowsheet Row PULMONARY REHAB OTHER RESPIRATORY from 11/19/2016 in [place]  Date  09/17/16  Educator  EP  Instruction Review Code  2- meets goals/outcomes      Pulmonary Medications:  -Verbally interactive group education provided by instructor with focus on inhaled medications and proper administration. Flowsheet Row PULMONARY REHAB OTHER RESPIRATORY from 11/19/2016 in [facility]  Date  11/12/16  Educator  Pharm  Instruction Review Code  2- meets goals/outcomes      Anatomy and Physiology of the Respiratory  System and Intimacy:  -Group instruction provided by PowerPoint, verbal discussion, and written material to support subject matter. Instructor reviews respiratory cycle and anatomical components of the respiratory system and their functions. Instructor also reviews differences in obstructive and restrictive respiratory diseases with examples of each. Intimacy, Sex, and Sexuality differences are reviewed with a discussion on how relationships can change when diagnosed with pulmonary disease. Common sexual concerns are reviewed.   Knowledge Questionnaire Score:   Core Components/Risk Factors/Patient Goals at Admission:     Personal Goals and Risk Factors at Admission - 08/07/16 1102      Core Components/Risk Factors/Patient Goals on Admission   Increase Strength and Stamina Yes   Intervention Provide advice, education, support  and counseling about physical activity/exercise needs.;Develop an individualized exercise prescription for aerobic and resistive training based on initial evaluation findings, risk stratification, comorbidities and participant's personal goals.   Expected Outcomes Achievement of increased cardiorespiratory fitness and enhanced flexibility, muscular endurance and strength shown through measurements of functional capacity and personal statement of participant.   Improve shortness of breath with ADL's Yes   Intervention Provide education, individualized exercise plan and daily activity instruction to help decrease symptoms of SOB with activities of daily living.   Expected Outcomes Short Term: Achieves a reduction of symptoms when performing activities of daily living.   Develop more efficient breathing techniques such as purse lipped breathing and diaphragmatic breathing; and practicing self-pacing with activity Yes   Intervention Provide education, demonstration and support about specific breathing techniuqes utilized for more efficient breathing. Include techniques such as  pursed lipped breathing, diaphragmatic breathing and self-pacing activity.   Expected Outcomes Short Term: Participant will be able to demonstrate and use breathing techniques as needed throughout daily activities.   Increase knowledge of respiratory medications and ability to use respiratory devices properly  Yes   Intervention Provide education and demonstration as needed of appropriate use of medications, inhalers, and oxygen therapy.   Expected Outcomes Short Term: Achieves understanding of medications use. Understands that oxygen is a medication prescribed by physician. Demonstrates appropriate use of inhaler and oxygen therapy.      Core Components/Risk Factors/Patient Goals Review:      Goals and Risk Factor Review    Row Name 08/24/16 1436 09/14/16 1410 10/05/16 1419 11/09/16 1432 12/01/16 0900     Core Components/Risk Factors/Patient Goals Review   Personal Goals Review Increase Strength and Stamina;Develop more efficient breathing techniques such as purse lipped breathing and diaphragmatic breathing and practicing self-pacing with activity.;Improve shortness of breath with ADL's;Increase knowledge of respiratory medications and ability to use respiratory devices properly. Increase Strength and Stamina;Develop more efficient breathing techniques such as purse lipped breathing and diaphragmatic breathing and practicing self-pacing with activity.;Improve shortness of breath with ADL's;Increase knowledge of respiratory medications and ability to use respiratory devices properly. Increase Strength and Stamina;Develop more efficient breathing techniques such as purse lipped breathing and diaphragmatic breathing and practicing self-pacing with activity.;Improve shortness of breath with ADL's;Increase knowledge of respiratory medications and ability to use respiratory devices properly. Increase Strength and Stamina;Develop more efficient breathing techniques such as purse lipped breathing and  diaphragmatic breathing and practicing self-pacing with activity.;Improve shortness of breath with ADL's;Increase knowledge of respiratory medications and ability to use respiratory devices properly. Increase Strength and Stamina;Develop more efficient breathing techniques such as purse lipped breathing and diaphragmatic breathing and practicing self-pacing with activity.;Improve shortness of breath with ADL's;Increase knowledge of respiratory medications and ability to use respiratory devices properly.   Review Has only attended 1 exercise session, too early to have met any goals progressing well, nustep level 4, recumbent bike level 4, 7 laps on track.  Needs 8 liters of oxygen while exercising.   has been sick the last week, equipment has stayed the same, will increase levels when recovered from illness. Becoming more knowledgeable re: oxygen use and how to monitor his oxygen saturations, progressing, purse lip breathing improving Has definitely met his goals, impressed with what he has learned while in program.  will graduate in 2 more sessions   Expected Outcomes expect imrovement in goals in the next 30 days expect his strength and stamina will increase as workloads increase.   continue to progress when recovered to continue  meeting goals with time in program to continue exercising independently after discharge      Core Components/Risk Factors/Patient Goals at Discharge (Final Review):      Goals and Risk Factor Review - 12/01/16 0900      Core Components/Risk Factors/Patient Goals Review   Personal Goals Review Increase Strength and Stamina;Develop more efficient breathing techniques such as purse lipped breathing and diaphragmatic breathing and practicing self-pacing with activity.;Improve shortness of breath with ADL's;Increase knowledge of respiratory medications and ability to use respiratory devices properly.   Review Has definitely met his goals, impressed with what he has learned while in  program.  will graduate in 2 more sessions   Expected Outcomes to continue exercising independently after discharge      ITP Comments:   Comments: ITP REVIEW Pt is making expected progress toward pulmonary rehab goals after completing 22 sessions. Recommend continued exercise, life style modification, education, and utilization of breathing techniques to increase stamina and strength and decrease shortness of breath with exertion.

## 2016-12-07 ENCOUNTER — Encounter (HOSPITAL_COMMUNITY): Payer: Self-pay | Admitting: Emergency Medicine

## 2016-12-07 ENCOUNTER — Emergency Department (HOSPITAL_COMMUNITY): Payer: Medicare Other

## 2016-12-07 ENCOUNTER — Inpatient Hospital Stay (HOSPITAL_COMMUNITY)
Admission: EM | Admit: 2016-12-07 | Discharge: 2016-12-12 | DRG: 291 | Disposition: A | Discharge status: Home / Self Care | Payer: Medicare Other | Attending: Internal Medicine | Admitting: Internal Medicine

## 2016-12-07 DIAGNOSIS — I2729 Other secondary pulmonary hypertension: Secondary | ICD-10-CM | POA: Diagnosis present

## 2016-12-07 DIAGNOSIS — R1310 Dysphagia, unspecified: Secondary | ICD-10-CM | POA: Diagnosis present

## 2016-12-07 DIAGNOSIS — I2781 Cor pulmonale (chronic): Secondary | ICD-10-CM | POA: Diagnosis present

## 2016-12-07 DIAGNOSIS — I272 Pulmonary hypertension, unspecified: Secondary | ICD-10-CM

## 2016-12-07 DIAGNOSIS — R778 Other specified abnormalities of plasma proteins: Secondary | ICD-10-CM | POA: Diagnosis present

## 2016-12-07 DIAGNOSIS — I10 Essential (primary) hypertension: Secondary | ICD-10-CM | POA: Diagnosis present

## 2016-12-07 DIAGNOSIS — I110 Hypertensive heart disease with heart failure: Principal | ICD-10-CM | POA: Diagnosis present

## 2016-12-07 DIAGNOSIS — I248 Other forms of acute ischemic heart disease: Secondary | ICD-10-CM | POA: Diagnosis present

## 2016-12-07 DIAGNOSIS — R0602 Shortness of breath: Secondary | ICD-10-CM | POA: Diagnosis not present

## 2016-12-07 DIAGNOSIS — D649 Anemia, unspecified: Secondary | ICD-10-CM | POA: Diagnosis present

## 2016-12-07 DIAGNOSIS — R059 Cough, unspecified: Secondary | ICD-10-CM | POA: Diagnosis present

## 2016-12-07 DIAGNOSIS — I5031 Acute diastolic (congestive) heart failure: Secondary | ICD-10-CM | POA: Diagnosis present

## 2016-12-07 DIAGNOSIS — Z8249 Family history of ischemic heart disease and other diseases of the circulatory system: Secondary | ICD-10-CM

## 2016-12-07 DIAGNOSIS — M069 Rheumatoid arthritis, unspecified: Secondary | ICD-10-CM | POA: Diagnosis present

## 2016-12-07 DIAGNOSIS — R05 Cough: Secondary | ICD-10-CM | POA: Diagnosis present

## 2016-12-07 DIAGNOSIS — J8410 Pulmonary fibrosis, unspecified: Secondary | ICD-10-CM | POA: Diagnosis present

## 2016-12-07 DIAGNOSIS — E8770 Fluid overload, unspecified: Secondary | ICD-10-CM | POA: Diagnosis present

## 2016-12-07 DIAGNOSIS — Z9981 Dependence on supplemental oxygen: Secondary | ICD-10-CM

## 2016-12-07 DIAGNOSIS — Z87891 Personal history of nicotine dependence: Secondary | ICD-10-CM

## 2016-12-07 DIAGNOSIS — J9621 Acute and chronic respiratory failure with hypoxia: Secondary | ICD-10-CM | POA: Diagnosis present

## 2016-12-07 DIAGNOSIS — R7989 Other specified abnormal findings of blood chemistry: Secondary | ICD-10-CM | POA: Diagnosis present

## 2016-12-07 DIAGNOSIS — J479 Bronchiectasis, uncomplicated: Secondary | ICD-10-CM | POA: Diagnosis present

## 2016-12-07 DIAGNOSIS — Z888 Allergy status to other drugs, medicaments and biological substances status: Secondary | ICD-10-CM

## 2016-12-07 DIAGNOSIS — E785 Hyperlipidemia, unspecified: Secondary | ICD-10-CM | POA: Diagnosis present

## 2016-12-07 DIAGNOSIS — Z7982 Long term (current) use of aspirin: Secondary | ICD-10-CM

## 2016-12-07 DIAGNOSIS — J439 Emphysema, unspecified: Secondary | ICD-10-CM | POA: Diagnosis present

## 2016-12-07 DIAGNOSIS — I350 Nonrheumatic aortic (valve) stenosis: Secondary | ICD-10-CM | POA: Diagnosis present

## 2016-12-07 DIAGNOSIS — E876 Hypokalemia: Secondary | ICD-10-CM | POA: Diagnosis present

## 2016-12-07 DIAGNOSIS — Z800 Family history of malignant neoplasm of digestive organs: Secondary | ICD-10-CM

## 2016-12-07 LAB — COMPREHENSIVE METABOLIC PANEL
ALBUMIN: 3.1 g/dL — AB (ref 3.5–5.0)
ALK PHOS: 74 U/L (ref 38–126)
ALT: 50 U/L (ref 17–63)
AST: 76 U/L — ABNORMAL HIGH (ref 15–41)
Anion gap: 14 (ref 5–15)
BUN: 30 mg/dL — ABNORMAL HIGH (ref 6–20)
CALCIUM: 8.9 mg/dL (ref 8.9–10.3)
CO2: 19 mmol/L — AB (ref 22–32)
Chloride: 104 mmol/L (ref 101–111)
Creatinine, Ser: 1.47 mg/dL — ABNORMAL HIGH (ref 0.61–1.24)
GFR calc non Af Amer: 46 mL/min — ABNORMAL LOW (ref >60)
GFR, EST AFRICAN AMERICAN: 53 mL/min — AB (ref >60)
GLUCOSE: 102 mg/dL — AB (ref 65–99)
Potassium: 4 mmol/L (ref 3.5–5.1)
SODIUM: 137 mmol/L (ref 135–145)
Total Bilirubin: 1.9 mg/dL — ABNORMAL HIGH (ref 0.3–1.2)
Total Protein: 6.1 g/dL — ABNORMAL LOW (ref 6.5–8.1)

## 2016-12-07 LAB — CBC WITH DIFFERENTIAL/PLATELET
Basophils Absolute: 0 10*3/uL (ref 0.0–0.1)
Basophils Relative: 0 %
EOS ABS: 0 10*3/uL (ref 0.0–0.7)
Eosinophils Relative: 0 %
HCT: 35.6 % — ABNORMAL LOW (ref 39.0–52.0)
HEMOGLOBIN: 11.7 g/dL — AB (ref 13.0–17.0)
LYMPHS ABS: 1.4 10*3/uL (ref 0.7–4.0)
Lymphocytes Relative: 12 %
MCH: 28.4 pg (ref 26.0–34.0)
MCHC: 32.9 g/dL (ref 30.0–36.0)
MCV: 86.4 fL (ref 78.0–100.0)
Monocytes Absolute: 1.1 10*3/uL — ABNORMAL HIGH (ref 0.1–1.0)
Monocytes Relative: 10 %
NEUTROS ABS: 9.4 10*3/uL — AB (ref 1.7–7.7)
NEUTROS PCT: 78 %
Platelets: 258 10*3/uL (ref 150–400)
RBC: 4.12 MIL/uL — AB (ref 4.22–5.81)
RDW: 15.5 % (ref 11.5–15.5)
WBC: 12 10*3/uL — ABNORMAL HIGH (ref 4.0–10.5)

## 2016-12-07 LAB — TROPONIN I: Troponin I: 0.07 ng/mL (ref <0.03)

## 2016-12-07 NOTE — ED Notes (Signed)
0.07 troponin called by lab

## 2016-12-07 NOTE — ED Triage Notes (Signed)
BIB [facility] from home for dyspnea/SOB for 3-4 days with a productive cough with yellowish tan sputum.  Pt on 2-3L at home at rest, 8L with activity.  Oxygen requirements changed within last few days.  He was 72% at home on 2L.  EMS put him on 15L NRB and he came up to 100%.  Maintained at 10L.  Pitting edema on legs.  VSS 126/82, 70s HR.  12 lead unremarkable.

## 2016-12-08 ENCOUNTER — Emergency Department (HOSPITAL_COMMUNITY): Payer: Medicare Other

## 2016-12-08 ENCOUNTER — Encounter (HOSPITAL_COMMUNITY): Payer: Self-pay | Admitting: Radiology

## 2016-12-08 ENCOUNTER — Inpatient Hospital Stay (HOSPITAL_COMMUNITY): Payer: Medicare Other

## 2016-12-08 ENCOUNTER — Encounter (HOSPITAL_COMMUNITY): Admission: RE | Admit: 2016-12-08 | Payer: Medicare Other | Source: Ambulatory Visit

## 2016-12-08 DIAGNOSIS — R1310 Dysphagia, unspecified: Secondary | ICD-10-CM | POA: Diagnosis present

## 2016-12-08 DIAGNOSIS — J9621 Acute and chronic respiratory failure with hypoxia: Secondary | ICD-10-CM | POA: Diagnosis present

## 2016-12-08 DIAGNOSIS — R0600 Dyspnea, unspecified: Secondary | ICD-10-CM

## 2016-12-08 DIAGNOSIS — Z87891 Personal history of nicotine dependence: Secondary | ICD-10-CM | POA: Diagnosis not present

## 2016-12-08 DIAGNOSIS — E8770 Fluid overload, unspecified: Secondary | ICD-10-CM | POA: Diagnosis present

## 2016-12-08 DIAGNOSIS — D509 Iron deficiency anemia, unspecified: Secondary | ICD-10-CM | POA: Diagnosis not present

## 2016-12-08 DIAGNOSIS — Z800 Family history of malignant neoplasm of digestive organs: Secondary | ICD-10-CM | POA: Diagnosis not present

## 2016-12-08 DIAGNOSIS — I2781 Cor pulmonale (chronic): Secondary | ICD-10-CM | POA: Diagnosis present

## 2016-12-08 DIAGNOSIS — D649 Anemia, unspecified: Secondary | ICD-10-CM | POA: Diagnosis present

## 2016-12-08 DIAGNOSIS — R748 Abnormal levels of other serum enzymes: Secondary | ICD-10-CM | POA: Diagnosis not present

## 2016-12-08 DIAGNOSIS — J8410 Pulmonary fibrosis, unspecified: Secondary | ICD-10-CM | POA: Diagnosis not present

## 2016-12-08 DIAGNOSIS — J9620 Acute and chronic respiratory failure, unspecified whether with hypoxia or hypercapnia: Secondary | ICD-10-CM | POA: Diagnosis not present

## 2016-12-08 DIAGNOSIS — J439 Emphysema, unspecified: Secondary | ICD-10-CM | POA: Diagnosis present

## 2016-12-08 DIAGNOSIS — M069 Rheumatoid arthritis, unspecified: Secondary | ICD-10-CM | POA: Diagnosis present

## 2016-12-08 DIAGNOSIS — I110 Hypertensive heart disease with heart failure: Secondary | ICD-10-CM | POA: Diagnosis present

## 2016-12-08 DIAGNOSIS — Z7982 Long term (current) use of aspirin: Secondary | ICD-10-CM | POA: Diagnosis not present

## 2016-12-08 DIAGNOSIS — I248 Other forms of acute ischemic heart disease: Secondary | ICD-10-CM | POA: Diagnosis present

## 2016-12-08 DIAGNOSIS — I2729 Other secondary pulmonary hypertension: Secondary | ICD-10-CM | POA: Diagnosis present

## 2016-12-08 DIAGNOSIS — R778 Other specified abnormalities of plasma proteins: Secondary | ICD-10-CM | POA: Diagnosis present

## 2016-12-08 DIAGNOSIS — Z8249 Family history of ischemic heart disease and other diseases of the circulatory system: Secondary | ICD-10-CM | POA: Diagnosis not present

## 2016-12-08 DIAGNOSIS — I350 Nonrheumatic aortic (valve) stenosis: Secondary | ICD-10-CM | POA: Diagnosis present

## 2016-12-08 DIAGNOSIS — R7989 Other specified abnormal findings of blood chemistry: Secondary | ICD-10-CM

## 2016-12-08 DIAGNOSIS — J479 Bronchiectasis, uncomplicated: Secondary | ICD-10-CM | POA: Diagnosis present

## 2016-12-08 DIAGNOSIS — E785 Hyperlipidemia, unspecified: Secondary | ICD-10-CM | POA: Diagnosis present

## 2016-12-08 DIAGNOSIS — I10 Essential (primary) hypertension: Secondary | ICD-10-CM | POA: Diagnosis not present

## 2016-12-08 DIAGNOSIS — E876 Hypokalemia: Secondary | ICD-10-CM | POA: Diagnosis present

## 2016-12-08 DIAGNOSIS — I5031 Acute diastolic (congestive) heart failure: Secondary | ICD-10-CM | POA: Diagnosis not present

## 2016-12-08 DIAGNOSIS — R0602 Shortness of breath: Secondary | ICD-10-CM | POA: Diagnosis present

## 2016-12-08 DIAGNOSIS — Z888 Allergy status to other drugs, medicaments and biological substances status: Secondary | ICD-10-CM | POA: Diagnosis not present

## 2016-12-08 DIAGNOSIS — Z9981 Dependence on supplemental oxygen: Secondary | ICD-10-CM | POA: Diagnosis not present

## 2016-12-08 LAB — CBC
HEMATOCRIT: 35.5 % — AB (ref 39.0–52.0)
Hemoglobin: 11.3 g/dL — ABNORMAL LOW (ref 13.0–17.0)
MCH: 27.6 pg (ref 26.0–34.0)
MCHC: 31.8 g/dL (ref 30.0–36.0)
MCV: 86.6 fL (ref 78.0–100.0)
Platelets: 254 10*3/uL (ref 150–400)
RBC: 4.1 MIL/uL — ABNORMAL LOW (ref 4.22–5.81)
RDW: 15.5 % (ref 11.5–15.5)
WBC: 12.7 10*3/uL — ABNORMAL HIGH (ref 4.0–10.5)

## 2016-12-08 LAB — URINALYSIS, ROUTINE W REFLEX MICROSCOPIC
Bilirubin Urine: NEGATIVE
Glucose, UA: NEGATIVE mg/dL
Hgb urine dipstick: NEGATIVE
Ketones, ur: NEGATIVE mg/dL
Leukocytes, UA: NEGATIVE
Nitrite: NEGATIVE
Protein, ur: NEGATIVE mg/dL
Specific Gravity, Urine: 1.006 (ref 1.005–1.030)
pH: 6 (ref 5.0–8.0)

## 2016-12-08 LAB — COMPREHENSIVE METABOLIC PANEL
ALT: 49 U/L (ref 17–63)
AST: 74 U/L — AB (ref 15–41)
Albumin: 3 g/dL — ABNORMAL LOW (ref 3.5–5.0)
Alkaline Phosphatase: 70 U/L (ref 38–126)
Anion gap: 10 (ref 5–15)
BILIRUBIN TOTAL: 1.8 mg/dL — AB (ref 0.3–1.2)
BUN: 26 mg/dL — AB (ref 6–20)
CO2: 23 mmol/L (ref 22–32)
Calcium: 9.3 mg/dL (ref 8.9–10.3)
Chloride: 107 mmol/L (ref 101–111)
Creatinine, Ser: 1.36 mg/dL — ABNORMAL HIGH (ref 0.61–1.24)
GFR calc non Af Amer: 50 mL/min — ABNORMAL LOW (ref >60)
GFR, EST AFRICAN AMERICAN: 58 mL/min — AB (ref >60)
Glucose, Bld: 174 mg/dL — ABNORMAL HIGH (ref 65–99)
POTASSIUM: 3.9 mmol/L (ref 3.5–5.1)
Sodium: 140 mmol/L (ref 135–145)
TOTAL PROTEIN: 6.1 g/dL — AB (ref 6.5–8.1)

## 2016-12-08 LAB — IRON AND TIBC
Iron: 13 ug/dL — ABNORMAL LOW (ref 45–182)
SATURATION RATIOS: 4 % — AB (ref 17.9–39.5)
TIBC: 330 ug/dL (ref 250–450)
UIBC: 317 ug/dL

## 2016-12-08 LAB — CBC WITH DIFFERENTIAL/PLATELET
Basophils Absolute: 0 10*3/uL (ref 0.0–0.1)
Basophils Relative: 0 %
Eosinophils Absolute: 0.2 10*3/uL (ref 0.0–0.7)
Eosinophils Relative: 2 %
HCT: 37.1 % — ABNORMAL LOW (ref 39.0–52.0)
Hemoglobin: 12 g/dL — ABNORMAL LOW (ref 13.0–17.0)
Lymphocytes Relative: 9 %
Lymphs Abs: 1 10*3/uL (ref 0.7–4.0)
MCH: 28 pg (ref 26.0–34.0)
MCHC: 32.3 g/dL (ref 30.0–36.0)
MCV: 86.5 fL (ref 78.0–100.0)
Monocytes Absolute: 0.8 10*3/uL (ref 0.1–1.0)
Monocytes Relative: 7 %
Neutro Abs: 10.1 10*3/uL — ABNORMAL HIGH (ref 1.7–7.7)
Neutrophils Relative %: 82 %
Platelets: 246 10*3/uL (ref 150–400)
RBC: 4.29 MIL/uL (ref 4.22–5.81)
RDW: 15.8 % — ABNORMAL HIGH (ref 11.5–15.5)
WBC: 12.2 10*3/uL — ABNORMAL HIGH (ref 4.0–10.5)

## 2016-12-08 LAB — ECHOCARDIOGRAM COMPLETE
Height: 65 in
Weight: 2151.69 oz

## 2016-12-08 LAB — PROCALCITONIN: PROCALCITONIN: 0.4 ng/mL

## 2016-12-08 LAB — MAGNESIUM: Magnesium: 2.3 mg/dL (ref 1.7–2.4)

## 2016-12-08 LAB — BRAIN NATRIURETIC PEPTIDE: B NATRIURETIC PEPTIDE 5: 1131.1 pg/mL — AB (ref 0.0–100.0)

## 2016-12-08 LAB — RETICULOCYTES
RBC.: 4.1 MIL/uL — ABNORMAL LOW (ref 4.22–5.81)
Retic Count, Absolute: 61.5 10*3/uL (ref 19.0–186.0)
Retic Ct Pct: 1.5 % (ref 0.4–3.1)

## 2016-12-08 LAB — TROPONIN I
Troponin I: 0.07 ng/mL (ref <0.03)
Troponin I: 0.06 ng/mL (ref <0.03)

## 2016-12-08 LAB — FERRITIN: Ferritin: 62 ng/mL (ref 24–336)

## 2016-12-08 LAB — MRSA PCR SCREENING: MRSA by PCR: NEGATIVE

## 2016-12-08 LAB — PHOSPHORUS: Phosphorus: 3.2 mg/dL (ref 2.5–4.6)

## 2016-12-08 LAB — FOLATE: Folate: 28.4 ng/mL (ref >5.9)

## 2016-12-08 LAB — VITAMIN B12: VITAMIN B 12: 664 pg/mL (ref 180–914)

## 2016-12-08 MED ORDER — TRAMADOL HCL 50 MG PO TABS
50.0000 mg | ORAL_TABLET | Freq: Four times a day (QID) | ORAL | Status: DC | PRN
  Administered 2016-12-08: 50 mg via ORAL
  Filled 2016-12-08: qty 1

## 2016-12-08 MED ORDER — IPRATROPIUM-ALBUTEROL 0.5-2.5 (3) MG/3ML IN SOLN
3.0000 mL | Freq: Once | RESPIRATORY_TRACT | Status: AC
  Administered 2016-12-08: 3 mL via RESPIRATORY_TRACT

## 2016-12-08 MED ORDER — ALBUTEROL SULFATE (2.5 MG/3ML) 0.083% IN NEBU: 2.5000 mg | INHALATION_SOLUTION | RESPIRATORY_TRACT | Status: DC | PRN

## 2016-12-08 MED ORDER — ENOXAPARIN SODIUM 40 MG/0.4ML ~~LOC~~ SOLN
40.0000 mg | SUBCUTANEOUS | Status: DC
  Administered 2016-12-08 – 2016-12-11 (×4): 40 mg via SUBCUTANEOUS
  Filled 2016-12-08 (×5): qty 0.4

## 2016-12-08 MED ORDER — MAGNESIUM SULFATE 2 GM/50ML IV SOLN
2.0000 g | Freq: Once | INTRAVENOUS | Status: AC
  Administered 2016-12-08: 2 g via INTRAVENOUS
  Filled 2016-12-08: qty 50

## 2016-12-08 MED ORDER — ASPIRIN EC 325 MG PO TBEC
325.0000 mg | DELAYED_RELEASE_TABLET | Freq: Once | ORAL | Status: AC
  Administered 2016-12-08: 325 mg via ORAL
  Filled 2016-12-08: qty 1

## 2016-12-08 MED ORDER — PERFLUTREN LIPID MICROSPHERE
INTRAVENOUS | Status: AC
  Filled 2016-12-08: qty 10

## 2016-12-08 MED ORDER — FUROSEMIDE 10 MG/ML IJ SOLN
40.0000 mg | Freq: Two times a day (BID) | INTRAMUSCULAR | Status: DC
  Administered 2016-12-08 – 2016-12-09 (×3): 40 mg via INTRAVENOUS
  Filled 2016-12-08 (×3): qty 4

## 2016-12-08 MED ORDER — PERFLUTREN LIPID MICROSPHERE
1.0000 mL | INTRAVENOUS | Status: DC | PRN
  Administered 2016-12-08: 3 mL via INTRAVENOUS
  Filled 2016-12-08: qty 10

## 2016-12-08 MED ORDER — ORAL CARE MOUTH RINSE
15.0000 mL | Freq: Two times a day (BID) | OROMUCOSAL | Status: DC
  Administered 2016-12-08 – 2016-12-11 (×8): 15 mL via OROMUCOSAL

## 2016-12-08 MED ORDER — GUAIFENESIN ER 600 MG PO TB12
600.0000 mg | ORAL_TABLET | Freq: Two times a day (BID) | ORAL | Status: DC
  Administered 2016-12-08 – 2016-12-12 (×9): 600 mg via ORAL
  Filled 2016-12-08 (×9): qty 1

## 2016-12-08 MED ORDER — IOPAMIDOL (ISOVUE-370) INJECTION 76%
INTRAVENOUS | Status: AC
  Administered 2016-12-08: 80 mL
  Filled 2016-12-08: qty 100

## 2016-12-08 MED ORDER — FUROSEMIDE 10 MG/ML IJ SOLN
40.0000 mg | Freq: Once | INTRAMUSCULAR | Status: AC
  Administered 2016-12-08: 40 mg via INTRAVENOUS
  Filled 2016-12-08: qty 4

## 2016-12-08 MED ORDER — ACETAMINOPHEN 325 MG PO TABS: 650.0000 mg | ORAL_TABLET | Freq: Four times a day (QID) | ORAL | Status: DC | PRN

## 2016-12-08 MED ORDER — LEVOFLOXACIN IN D5W 750 MG/150ML IV SOLN
750.0000 mg | INTRAVENOUS | Status: DC
  Administered 2016-12-08: 750 mg via INTRAVENOUS
  Filled 2016-12-08: qty 150

## 2016-12-08 MED ORDER — IPRATROPIUM BROMIDE 0.02 % IN SOLN: 0.5000 mg | RESPIRATORY_TRACT | Status: DC | PRN

## 2016-12-08 MED ORDER — SODIUM CHLORIDE 0.9% FLUSH
3.0000 mL | Freq: Two times a day (BID) | INTRAVENOUS | Status: DC
  Administered 2016-12-08 – 2016-12-11 (×8): 3 mL via INTRAVENOUS

## 2016-12-08 NOTE — Progress Notes (Signed)
Patient admitted earlier this morning. See H&P for full details.   He's a 71 y.o. male with chronic respiratory failure (on 3 - 8 LPM at home), post-inflammatory pulmonary fibrosis, and RA who had worsening dyspnea for 2 days. When he called EMS he had SpO2 of 78% improved with NRB. He was brought to the ED where he remained dyspneic despite supplemental oxygen. CTA chest showed extensive emphysema, traction bronchiectasis, and cardiomegaly with right-sided heart dysfunction and pulmonary hypertension, but no PE. BNP was 1,131.1. He was admitted for acute on chronic respiratory failure due to volume overload and given lasix.  On exam he is a pleasant male in no distress breathing with venturi mask. He is tachypneic with coarse crackles in mid and lower lung zones. 2+ LE edema.  Acute on chronic respiratory failure due to volume overload. Echo showed EF 55-60%, RV pressure/volume overload with severely dilated RV and moderate systolic dysfunction.  - Continue SDU Tx, oxygen supplementation, and BiPAP prn. Full code. - Getting good UOP from lasix 49m IV, will continue this BID. Down 4lbs from admission. - Troponin has shown flat trend consistent with demand ischemia in setting of acute diastolic CHF. - Continue levaquin. Check PCT to help guide therapy.   RVance Gather MD 12/08/2016 7:48 AM

## 2016-12-08 NOTE — ED Provider Notes (Signed)
[facility] DEPT Provider Note   CSN: [id number] Arrival date & time: 12/07/16  2138     History   Chief Complaint Chief Complaint  Patient presents with  . Shortness of Breath    HPI Gilbert Dominguez is a 71 y.o. male.  Patient with history of pulmonary fibrosis, RA, HTN, O2 dependent (3L) chronic respiratory failure (followed by Dr. Melvyn Novas) presents with worse than baseline SOB/DOE with increased oxygen requirement for the past 3 days. No fever. He has a usual cough that is essentially unchanged. No chest pain, nausea, vomiting. Per EMS he was found to be significantly hypoxic at home at 72% on 2L.    The history is provided by the patient. No language interpreter was used.    Past Medical History:  Diagnosis Date  . Chronic respiratory failure ([place])   . Hypertension   . Pulmonary fibrosis, postinflammatory ([place])   . Rheumatoid arthritis(714.0)     Patient Active Problem List   Diagnosis Date Noted  . Essential hypertension 11/19/2015  . Cough 09/05/2014  . Postinflammatory pulmonary fibrosis ([place]) 11/23/2013  . Dyspnea 11/23/2013  . Chronic respiratory failure with hypoxia ([place]) 11/23/2013    Past Surgical History:  Procedure Laterality Date  . ACNE CYST REMOVAL  1968  . [place] Medications    Prior to Admission medications   Medication Sig Start Date End Date Taking? Authorizing Provider  alendronate (FOSAMAX) 70 MG tablet Take 70 mg by mouth once a week. Take with a full glass of water on an empty stomach.   Yes Historical Provider, MD  aspirin 81 MG tablet Take 81 mg by mouth daily.   Yes Historical Provider, MD  atorvastatin (LIPITOR) 20 MG tablet Take 20 mg by mouth daily.   Yes Historical Provider, MD  Calcium Carbonate-Vit D-Min (CALCIUM 600+D PLUS MINERALS PO) Take 1 tablet by mouth 2 (two) times daily.   Yes Historical Provider, MD  chlorpheniramine (CHLOR-TRIMETON) 4 MG tablet Take 4 mg by mouth 2 (two) times daily as needed  for allergies.   Yes Historical Provider, MD  famotidine (PEPCID) 20 MG tablet TAKE 1 TABLET BY MOUTH AT BEDTIME 11/13/16  Yes Tanda Rockers, MD  furosemide (LASIX) 20 MG tablet Take 20 mg by mouth daily.    Yes Historical Provider, MD  loratadine (CLARITIN) 10 MG tablet Take 10 mg by mouth daily.   Yes Historical Provider, MD  OXYGEN 2 lpm with sleep and as needed with exertion   Yes Historical Provider, MD  pantoprazole (PROTONIX) 40 MG tablet TAKE 1 TABLET BY MOUTH EVERY DAY *TAKE 30-60 MINUTES BEFORE FIRST MEAL OF THE DAY* 06/29/16  Yes Tanda Rockers, MD  predniSONE (DELTASONE) 10 MG tablet TAKE 2 DAILY UNTIL BETTER THEN ONE DAILY UNTIL RETURN 09/24/16  Yes Tanda Rockers, MD  Pseudoephedrine-Guaifenesin (MUCINEX D MAX STRENGTH) [phone] MG TB12 Take 1 tablet by mouth 2 (two) times daily.    Yes Historical Provider, MD  terazosin (HYTRIN) 1 MG capsule Take 4 mg by mouth at bedtime.    Yes Historical Provider, MD    Family History Family History  Problem Relation Age of Onset  . Heart disease Father   . Liver cancer Mother     Social History Social History  Substance Use Topics  . Smoking status: Former Smoker    Packs/day: 1.00    Years: 36.00    Types: Cigarettes    Quit date: 10/14/1995  . Smokeless  tobacco: Never Used  . Alcohol use 1.8 oz/week    3 Cans of beer per week     Comment: occasionally     Allergies   Lovastatin   Review of Systems Review of Systems  Constitutional: Negative for chills, diaphoresis and fever.  HENT: Negative.  Negative for congestion, sore throat and trouble swallowing.   Respiratory: Positive for cough and shortness of breath.   Cardiovascular: Positive for leg swelling. Negative for chest pain.  Gastrointestinal: Negative.  Negative for abdominal pain, nausea and vomiting.  Musculoskeletal: Negative.   Skin: Negative.   Neurological: Negative.  Negative for syncope, weakness and light-headedness.     Physical Exam Updated Vital  Signs BP 123/85 (BP Location: Right Arm)   Pulse 76   Temp 98.8 F (37.1 C) (Oral)   Resp 23   Ht _0  (1.651 m)   Wt 62.6 kg   SpO2 (!) 78%   BMI 22.96 kg/m   Physical Exam  Constitutional: He is oriented to person, place, and time. He appears well-developed and well-nourished. No distress.  HENT:  Head: Normocephalic.  Mouth/Throat: Mucous membranes are dry.  Eyes: Conjunctivae are normal. No scleral icterus.  Neck: Normal range of motion. Neck supple.  Cardiovascular: Normal rate and regular rhythm.   No murmur heard. Pulmonary/Chest: Effort normal. He exhibits no tenderness.  Dyspneic when speaking. Bilateral rales, R>L.   Abdominal: Soft. Bowel sounds are normal. There is no tenderness. There is no rebound and no guarding.  Musculoskeletal: Normal range of motion. He exhibits no edema.  Neurological: He is alert and oriented to person, place, and time.  Skin: Skin is warm and dry. No rash noted.  Psychiatric: He has a normal mood and affect.     ED Treatments / Results  Labs (all labs ordered are listed, but only abnormal results are displayed) Labs Reviewed  CBC WITH DIFFERENTIAL/PLATELET - Abnormal; Notable for the following:       Result Value   WBC 12.0 (*)    RBC 4.12 (*)    Hemoglobin 11.7 (*)    HCT 35.6 (*)    Neutro Abs 9.4 (*)    Monocytes Absolute 1.1 (*)    All other components within normal limits  COMPREHENSIVE METABOLIC PANEL - Abnormal; Notable for the following:    CO2 19 (*)    Glucose, Bld 102 (*)    BUN 30 (*)    Creatinine, Ser 1.47 (*)    Total Protein 6.1 (*)    Albumin 3.1 (*)    AST 76 (*)    Total Bilirubin 1.9 (*)    GFR calc non Af Amer 46 (*)    GFR calc Af Amer 53 (*)    All other components within normal limits  TROPONIN I - Abnormal; Notable for the following:    Troponin I 0.07 (*)    All other components within normal limits    EKG  EKG Interpretation  Date/Time:  Monday December 07 2016 21:41:32 EST Ventricular  Rate:  71 PR Interval:    QRS Duration: 105 QT Interval:  390 QTC Calculation: 424 R Axis:   146 Text Interpretation:  Sinus rhythm RVH with secondary repolarization abnrm No acute changes No old tracing to compare Confirmed by Kathrynn Humble, MD, Thelma Comp [phone]) on 12/07/2016 10:12:13 PM       Radiology Dg Chest Port 1 View  Result Date: 12/07/2016 CLINICAL DATA:  Acute onset of shortness of breath and dyspnea. Productive cough. Initial encounter.  EXAM: PORTABLE CHEST 1 VIEW COMPARISON:  Chest radiograph performed 11/19/2015 FINDINGS: The lungs are well-aerated. Chronic fibrotic changes are again noted, slightly less well characterized due to motion artifact. There may be superimposed right midlung airspace opacity. There is no evidence of pleural effusion or pneumothorax. The cardiomediastinal silhouette is mildly enlarged. No acute osseous abnormalities are seen. IMPRESSION: 1. Question of superimposed acute right midlung airspace opacity, which could reflect pneumonia. 2. Underlying chronic bilateral fibrotic changes again noted. 3. Mild cardiomegaly. Electronically Signed   By: Garald Balding M.D.   On: 12/07/2016 23:01    Procedures Procedures (including critical care time)  Medications Ordered in ED Medications  aspirin EC tablet 325 mg (not administered)  iopamidol (ISOVUE-370) 76 % injection (80 mLs  Contrast Given 12/08/16 0040)     Initial Impression / Assessment and Plan / ED Course  I have reviewed the triage vital signs and the nursing notes.  Pertinent labs & imaging results that were available during my care of the patient were reviewed by me and considered in my medical decision making (see chart for details).     CRITICAL CARE Performed by: Charlann Lange A  Patient presents with SOB/DOE, hypoxia while on chronic O2. He denies pain, fever, vomiting.  He maintains 100% O2 saturation with 10L O2 by mask. He does not appear distressed but is dyspneic when speaking. EKG  shows S1Q3 without significant corresponding t-wave inversion. Consider PE. DDx: PNA vs pulmonary HTN vs PE vs CHF vs ischemia with troponin 0.07.   CTA shows no PE, r/o PNA. Suspect pulmonary HTN, right heart strain. Elevated troponin attributed to demand, doubt ischemic event.   The patient is seen and evaluated by Dr. Kathrynn Humble. He will be admitted to hospitalist service - Dr. Olevia Bowens accepting. The patient is stable.   Total critical care time: 40 minutes  Critical care time was exclusive of separately billable procedures and treating other patients.  Critical care was necessary to treat or prevent imminent or life-threatening deterioration.  Critical care was time spent personally by me on the following activities: development of treatment plan with patient and/or surrogate as well as nursing, discussions with consultants, evaluation of patient's response to treatment, examination of patient, obtaining history from patient or surrogate, ordering and performing treatments and interventions, ordering and review of laboratory studies, ordering and review of radiographic studies, pulse oximetry and re-evaluation of patient's condition.   Final Clinical Impressions(s) / ED Diagnoses   Final diagnoses:  None   1. Pulmonary HTN 2. SOB with hypoxia  New Prescriptions New Prescriptions   No medications on file     Charlann Lange, PA-C 12/08/16 0222    Varney Biles, MD 12/09/16 [phone]

## 2016-12-08 NOTE — Progress Notes (Signed)
PHARMACY NOTE:  ANTIMICROBIAL RENAL DOSAGE ADJUSTMENT  Current antimicrobial regimen includes a mismatch between antimicrobial dosage and estimated renal function.  As per policy approved by the Pharmacy & Therapeutics and Medical Executive Committees, the antimicrobial dosage will be adjusted accordingly.  Current antimicrobial dosage:  Levaquin 520m daily  Indication: bronchiectasis  Renal Function:  Estimated Creatinine Clearance: 39.5 mL/min (by C-G formula based on SCr of 1.47 mg/dL (H)).    Antimicrobial dosage has been changed to:  Levaquin [id number]  Thank you for allowing pharmacy to be a part of this patient's care.  CaSherlon HandingPharmD, BCPS Clinical pharmacist, pager [phone]/20/2018 6:06 AM

## 2016-12-08 NOTE — H&P (Signed)
History and Physical    Gilbert Dominguez AQT:[id number] DOB: 09/26/1946 DOA: 12/07/2016  PCP: Leonard Downing, MD   Patient coming from: Home.  Chief Complaint: Shortness of breath.  HPI: Gilbert Dominguez is a 71 y.o. male with medical history significant of chronic respiratory failure, post inflammatory pulmonary fibrosis, hypertension, rheumatoid arthritis, hyperlipidemia who is coming to the emergency department with complaints of progressively worse dyspnea since Saturday evening. He says that on Saturday while he was sitting watching the news he started having more cough than usual and dyspnea.   The patient is normally on 3-4 L of oxygen at home, but is only able to do this while at rest and has been using 8 liters while exerting. He denies fever, but complains of chills and fatigue. He complains of rhinorrhea, but denies sore throat. He has a chronic cough, which he thinks has been more productive the last few days. Last evening, the patient became more dyspneic than usual, became anxious, hyperventilating related and had to call EMS. On the field, the paramedics found him to have an O2 sat of 78%, but he quickly responded to nonrebreather mask at 10 L. Now, when seen in the emergency department he is on nasal cannula at 4 L/m and talking in full sentences.  ED Course: Patient received aspirin and supplemental oxygen. EKG was sinus rhythm with RVH. Troponin was 0.07 ng/mL. WBC 12, hemoglobin 11.7 g/dL and platelets 258. Sodium 137, potassium 4.0, chloride 104, bicarbonate 19 mmol/L. BUN was 30, creatinine 1.47 and glucose 102 mg/dL.  Imaging: CT scan angiogram chest did not show any PE, however showed emphysema, traction bronchiectasis, cardiomegaly with right-sided heart dysfunction and pulmonary hypertension.  Review of Systems: As per HPI otherwise 10 point review of systems negative.    Past Medical History:  Diagnosis Date  . Chronic respiratory failure ([place])   .  Hypertension   . Pulmonary fibrosis, postinflammatory ([place])   . Rheumatoid arthritis(714.0)     Past Surgical History:  Procedure Laterality Date  . ACNE CYST REMOVAL  1968  . VASECTOMY  1973     reports that he quit smoking about 21 years ago. His smoking use included Cigarettes. He has a 36.00 pack-year smoking history. He has never used smokeless tobacco. He reports that he drinks about 1.8 oz of alcohol per week . He reports that he does not use drugs.  Allergies  Allergen Reactions  . Lovastatin Rash    Family History  Problem Relation Age of Onset  . Heart disease Father   . Liver cancer Mother     Prior to Admission medications   Medication Sig Start Date End Date Taking? Authorizing Provider  alendronate (FOSAMAX) 70 MG tablet Take 70 mg by mouth once a week. Take with a full glass of water on an empty stomach.   Yes Historical Provider, MD  aspirin 81 MG tablet Take 81 mg by mouth daily.   Yes Historical Provider, MD  atorvastatin (LIPITOR) 20 MG tablet Take 20 mg by mouth daily.   Yes Historical Provider, MD  Calcium Carbonate-Vit D-Min (CALCIUM 600+D PLUS MINERALS PO) Take 1 tablet by mouth 2 (two) times daily.   Yes Historical Provider, MD  chlorpheniramine (CHLOR-TRIMETON) 4 MG tablet Take 4 mg by mouth 2 (two) times daily as needed for allergies.   Yes Historical Provider, MD  famotidine (PEPCID) 20 MG tablet TAKE 1 TABLET BY MOUTH AT BEDTIME 11/13/16  Yes Tanda Rockers, MD  furosemide (LASIX) 20 MG tablet  Take 20 mg by mouth daily.    Yes Historical Provider, MD  loratadine (CLARITIN) 10 MG tablet Take 10 mg by mouth daily.   Yes Historical Provider, MD  OXYGEN 2 lpm with sleep and as needed with exertion   Yes Historical Provider, MD  pantoprazole (PROTONIX) 40 MG tablet TAKE 1 TABLET BY MOUTH EVERY DAY *TAKE 30-60 MINUTES BEFORE FIRST MEAL OF THE DAY* 06/29/16  Yes Tanda Rockers, MD  predniSONE (DELTASONE) 10 MG tablet TAKE 2 DAILY UNTIL BETTER THEN ONE DAILY  UNTIL RETURN 09/24/16  Yes Tanda Rockers, MD  Pseudoephedrine-Guaifenesin (MUCINEX D MAX STRENGTH) [phone] MG TB12 Take 1 tablet by mouth 2 (two) times daily.    Yes Historical Provider, MD  terazosin (HYTRIN) 1 MG capsule Take 4 mg by mouth at bedtime.    Yes Historical Provider, MD    Physical Exam:  Constitutional: NAD, calm, comfortable Vitals:   12/07/16 2138 12/07/16 2140 12/07/16 2150 12/08/16 0306  BP: 123/85     Pulse: 76     Resp: 23     Temp: 98.8 F (37.1 C)     TempSrc: Oral     SpO2: 100% (!) 78%  92%  Weight:   62.6 kg (138 lb)   Height:   _0  (1.651 m)    Eyes: PERRL, lids and conjunctivae normal ENMT: Mucous membranes are moist. Posterior pharynx clear of any exudate or lesions. Neck: normal, supple, no masses, no thyromegaly Respiratory: Positive bibasilar rales, positive rales on right mid lung field, mild rhonchi, no wheezing. Tachypneic at 26 BPM. Positive supraclavicular muscle use.  Cardiovascular: Regular rate and rhythm, frequent extrasystoles, no murmurs / rubs / gallops. 2+ lower extremity edema. 2+ pedal pulses. No carotid bruits.  Abdomen: no tenderness, no masses palpated. No hepatosplenomegaly. Bowel sounds positive.  Musculoskeletal: no clubbing / cyanosis. Good ROM, no contractures. Normal muscle tone.  Skin: no rashes, lesions, ulcers on limited skin exam. Neurologic: CN 2-12 grossly intact. Sensation intact, DTR normal. Strength 5/5 in all 4.  Psychiatric: Normal judgment and insight. Alert and oriented x 3. Normal mood.    Labs on Admission: I have personally reviewed following labs and imaging studies  CBC:  Recent Labs Lab 12/07/16 2248  WBC 12.0*  NEUTROABS 9.4*  HGB 11.7*  HCT 35.6*  MCV 86.4  PLT 979   Basic Metabolic Panel:  Recent Labs Lab 12/07/16 2248  NA 137  K 4.0  CL 104  CO2 19*  GLUCOSE 102*  BUN 30*  CREATININE 1.47*  CALCIUM 8.9   GFR: Estimated Creatinine Clearance: 39.5 mL/min (by C-G formula based  on SCr of 1.47 mg/dL (H)). Liver Function Tests:  Recent Labs Lab 12/07/16 2248  AST 76*  ALT 50  ALKPHOS 74  BILITOT 1.9*  PROT 6.1*  ALBUMIN 3.1*   No results for input(s): LIPASE, AMYLASE in the last 168 hours. No results for input(s): AMMONIA in the last 168 hours. Coagulation Profile: No results for input(s): INR, PROTIME in the last 168 hours. Cardiac Enzymes:  Recent Labs Lab 12/07/16 2248  TROPONINI 0.07*   BNP (last 3 results) No results for input(s): PROBNP in the last 8760 hours. HbA1C: No results for input(s): HGBA1C in the last 72 hours. CBG: No results for input(s): GLUCAP in the last 168 hours. Lipid Profile: No results for input(s): CHOL, HDL, LDLCALC, TRIG, CHOLHDL, LDLDIRECT in the last 72 hours. Thyroid Function Tests: No results for input(s): TSH, T4TOTAL, FREET4, T3FREE, THYROIDAB in the  last 72 hours. Anemia Panel: No results for input(s): VITAMINB12, FOLATE, FERRITIN, TIBC, IRON, RETICCTPCT in the last 72 hours. Urine analysis: No results found for: COLORURINE, APPEARANCEUR, LABSPEC, PHURINE, GLUCOSEU, HGBUR, BILIRUBINUR, KETONESUR, PROTEINUR, UROBILINOGEN, NITRITE, LEUKOCYTESUR  Radiological Exams on Admission: Ct Angio Chest Pe W And/or Wo Contrast  Result Date: 12/08/2016 CLINICAL DATA:  71 year old male with increasing shortness of breath and cough. Concern for PE. EXAM: CT ANGIOGRAPHY CHEST WITH CONTRAST TECHNIQUE: Multidetector CT imaging of the chest was performed using the standard protocol during bolus administration of intravenous contrast. Multiplanar CT image reconstructions and MIPs were obtained to evaluate the vascular anatomy. CONTRAST:  80 cc Isovue 370 COMPARISON:  Chest radiograph dated  12/07/2016 FINDINGS: Cardiovascular: There is moderate cardiomegaly with enlargement of the right cardiac chambers. There is retrograde flow of contrast from the right atrium into the IVC compatible with a degree of right cardiac dysfunction.  Correlation with echocardiogram recommended. Calcified mitral annulus noted. No significant pericardial effusion. There is mild atherosclerotic calcification of the thoracic aorta. There is dilatation of the main pulmonary trunk suggestive of underlying pulmonary hypertension. Evaluation of the pulmonary arteries is limited due to suboptimal opacification of the distal branches. Areas of apparent decreased enhancement in the periphery of the distal branches of the lower lobe pulmonary arteries likely related to suboptimal enhancement and less likely may represent scarring or nonocclusive emboli. A nonopacified linear structure in the left lower lobe likely represents scarring within the vessel or in the lung parenchyma. No convincing or occlusive acute pulmonary embolus identified. Mediastinum/Nodes: There is no hilar adenopathy. Mildly enlarged right paratracheal lymph node measures 15 mm in short axis. Multiple small vascular collaterals noted in the posterior mediastinum. Esophagus and the thyroid gland are grossly unremarkable. Lungs/Pleura: There is severe emphysematous changes of the lungs with bibasilar pleuroparenchymal scarring and traction bronchiectasis. Triangular pleural based density along the right major fissure likely chronic and represents scarring. Patchy area of ground-glass density in the right upper lobe anteriorly may be chronic. Acute infiltrate is less likely but not entirely excluded. Clinical correlation is recommended there is no pleural effusion or pneumothorax. The central airways are patent. Upper Abdomen: Diffuse stranding of the upper abdominal mesentery. Musculoskeletal: Degenerative changes of the spine. No acute fracture. Review of the MIP images confirms the above findings. IMPRESSION: 1. No definite CT evidence of acute or occlusive PE. Suboptimal opacification of the peripheral branches limits evaluation. 2. Emphysema with pleuroparenchymal scarring and traction bronchiectasis  of the lower lobes. Patchy area of ground-glass density in the right upper lobe, likely chronic and less likely an acute pneumonia. Clinical correlation is recommended. 3. Moderate cardiomegaly with evidence of right cardiac dysfunction. Correlation with echocardiogram recommended. 4. Dilated pulmonary arteries suggestive of pulmonary hypertension. Electronically Signed   By: Anner Crete M.D.   On: 12/08/2016 01:13   Dg Chest Port 1 View  Result Date: 12/07/2016 CLINICAL DATA:  Acute onset of shortness of breath and dyspnea. Productive cough. Initial encounter. EXAM: PORTABLE CHEST 1 VIEW COMPARISON:  Chest radiograph performed 11/19/2015 FINDINGS: The lungs are well-aerated. Chronic fibrotic changes are again noted, slightly less well characterized due to motion artifact. There may be superimposed right midlung airspace opacity. There is no evidence of pleural effusion or pneumothorax. The cardiomediastinal silhouette is mildly enlarged. No acute osseous abnormalities are seen. IMPRESSION: 1. Question of superimposed acute right midlung airspace opacity, which could reflect pneumonia. 2. Underlying chronic bilateral fibrotic changes again noted. 3. Mild cardiomegaly. Electronically Signed   By:  Garald Balding M.D.   On: 12/07/2016 23:01    EKG: Independently reviewed. Vent. rate 71 BPM PR interval * ms QRS duration 105 ms QT/QTc 390/424 ms P-R-T axes 43 146 33 Sinus rhythm RVH with secondary repolarization abnrm No acute changes No old tracing to compare  Assessment/Plan Principal Problem:   Acute on chronic respiratory failure (HCC) Likely as a result of fluid overload. Admit to stepdown/inpatient. Continue supplemental oxygen. BiPAP ventilation as needed. Bronchodilators as needed. Mucinex 600 mg by mouth twice a day Check echocardiogram.  Active Problems:   Fluid overload Check BNP. Trend troponin levels. Furosemide 40 mg IVP 1 dose now. Continue furosemide 40 mg IVP twice  a day. Check echocardiogram.    Postinflammatory pulmonary fibrosis (HCC) Treat fluid overload. Continue supplemental oxygen. Bronchodilators as needed.    Cough As above. Mucinex 600 mg by mouth twice a day    Essential hypertension On furosemide. Continue Hytrin 4 mg at bedtime. Monitor blood pressure.    Elevated troponin Troponin level. Check echocardiogram.    Hyperlipidemia Hold atorvastatin due to mild elevation of AST for now. AST elevation to be as a result of right heart failure. Monitor LFTs.    Anemia Check anemia profile.   DVT prophylaxis: Lovenox SQ. Code Status: Full code. Family Communication:  Disposition Plan: Admit for troponin levels trending, echocardiogram and further evaluation. Consults called:  Admission status: Inpatient/SDU.   Reubin Milan MD [facility] Pager [phone].  If 7PM-7AM, please contact night-coverage [email or web address] Password [facility]  12/08/2016, 3:38 AM

## 2016-12-08 NOTE — ED Notes (Signed)
Patient transported to CT. 

## 2016-12-08 NOTE — Progress Notes (Signed)
  Echocardiogram 2D Echocardiogram has been performed with definity.  Aggie Cosier 12/08/2016, 2:37 PM

## 2016-12-08 NOTE — ED Notes (Addendum)
Pt returned to CT.  Hooked back up to monitor.

## 2016-12-09 DIAGNOSIS — R748 Abnormal levels of other serum enzymes: Secondary | ICD-10-CM

## 2016-12-09 DIAGNOSIS — J9621 Acute and chronic respiratory failure with hypoxia: Secondary | ICD-10-CM

## 2016-12-09 LAB — BASIC METABOLIC PANEL
Anion gap: 11 (ref 5–15)
BUN: 22 mg/dL — AB (ref 6–20)
CALCIUM: 8.8 mg/dL — AB (ref 8.9–10.3)
CO2: 29 mmol/L (ref 22–32)
CREATININE: 1.24 mg/dL (ref 0.61–1.24)
Chloride: 101 mmol/L (ref 101–111)
GFR calc Af Amer: >60 mL/min (ref >60)
GFR, EST NON AFRICAN AMERICAN: 56 mL/min — AB (ref >60)
GLUCOSE: 92 mg/dL (ref 65–99)
Potassium: 3.4 mmol/L — ABNORMAL LOW (ref 3.5–5.1)
Sodium: 141 mmol/L (ref 135–145)

## 2016-12-09 LAB — CBC
HEMATOCRIT: 35.8 % — AB (ref 39.0–52.0)
Hemoglobin: 11.5 g/dL — ABNORMAL LOW (ref 13.0–17.0)
MCH: 27.9 pg (ref 26.0–34.0)
MCHC: 32.1 g/dL (ref 30.0–36.0)
MCV: 86.9 fL (ref 78.0–100.0)
PLATELETS: 265 10*3/uL (ref 150–400)
RBC: 4.12 MIL/uL — ABNORMAL LOW (ref 4.22–5.81)
RDW: 15.6 % — AB (ref 11.5–15.5)
WBC: 11.6 10*3/uL — AB (ref 4.0–10.5)

## 2016-12-09 MED ORDER — SENNOSIDES-DOCUSATE SODIUM 8.6-50 MG PO TABS
2.0000 | ORAL_TABLET | Freq: Every day | ORAL | Status: DC
  Administered 2016-12-09 – 2016-12-11 (×3): 2 via ORAL
  Filled 2016-12-09 (×3): qty 2

## 2016-12-09 MED ORDER — ACETAMINOPHEN 325 MG PO TABS: 650.0000 mg | ORAL_TABLET | Freq: Four times a day (QID) | ORAL | Status: DC | PRN

## 2016-12-09 MED ORDER — TERAZOSIN HCL 2 MG PO CAPS
4.0000 mg | ORAL_CAPSULE | Freq: Every day | ORAL | Status: DC
  Administered 2016-12-09: 4 mg via ORAL
  Filled 2016-12-09 (×2): qty 2

## 2016-12-09 MED ORDER — POTASSIUM CHLORIDE CRYS ER 20 MEQ PO TBCR
40.0000 meq | EXTENDED_RELEASE_TABLET | Freq: Every day | ORAL | Status: DC
Start: 2016-12-09 — End: 2016-12-12
  Administered 2016-12-09 – 2016-12-12 (×4): 40 meq via ORAL
  Filled 2016-12-09 (×4): qty 2

## 2016-12-09 MED ORDER — PSEUDOEPHEDRINE-GUAIFENESIN ER 120-1200 MG PO TB12: 1.0000 | ORAL_TABLET | Freq: Two times a day (BID) | ORAL | Status: DC

## 2016-12-09 MED ORDER — POTASSIUM CHLORIDE CRYS ER 20 MEQ PO TBCR: 40.0000 meq | EXTENDED_RELEASE_TABLET | Freq: Once | ORAL | Status: DC

## 2016-12-09 MED ORDER — ALBUTEROL SULFATE (2.5 MG/3ML) 0.083% IN NEBU: 2.5000 mg | INHALATION_SOLUTION | RESPIRATORY_TRACT | Status: DC | PRN

## 2016-12-09 MED ORDER — SODIUM CHLORIDE 0.9 % IV SOLN
510.0000 mg | Freq: Once | INTRAVENOUS | Status: AC
  Administered 2016-12-09: 510 mg via INTRAVENOUS
  Filled 2016-12-09: qty 17

## 2016-12-09 MED ORDER — FAMOTIDINE 20 MG PO TABS
20.0000 mg | ORAL_TABLET | Freq: Every day | ORAL | Status: DC
  Administered 2016-12-09 – 2016-12-11 (×3): 20 mg via ORAL
  Filled 2016-12-09 (×3): qty 1

## 2016-12-09 MED ORDER — PREDNISONE 10 MG PO TABS
10.0000 mg | ORAL_TABLET | Freq: Every day | ORAL | Status: DC
  Administered 2016-12-10 – 2016-12-12 (×3): 10 mg via ORAL
  Filled 2016-12-09 (×3): qty 1

## 2016-12-09 MED ORDER — PANTOPRAZOLE SODIUM 40 MG PO TBEC
40.0000 mg | DELAYED_RELEASE_TABLET | Freq: Every day | ORAL | Status: DC
  Administered 2016-12-09 – 2016-12-12 (×4): 40 mg via ORAL
  Filled 2016-12-09 (×4): qty 1

## 2016-12-09 MED ORDER — ASPIRIN EC 81 MG PO TBEC
81.0000 mg | DELAYED_RELEASE_TABLET | Freq: Every day | ORAL | Status: DC
  Administered 2016-12-09 – 2016-12-12 (×4): 81 mg via ORAL
  Filled 2016-12-09 (×4): qty 1

## 2016-12-09 MED ORDER — FUROSEMIDE 10 MG/ML IJ SOLN
40.0000 mg | Freq: Every day | INTRAMUSCULAR | Status: DC
  Administered 2016-12-10 – 2016-12-11 (×2): 40 mg via INTRAVENOUS
  Filled 2016-12-09 (×2): qty 4

## 2016-12-09 NOTE — Progress Notes (Signed)
Patient transferred from  4N to 4E. Patient denies any pain or discomfort at this time. No resp distress noted at this time.

## 2016-12-09 NOTE — Consult Note (Signed)
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 At baseline is on oxygen & Pred (last visit w/ [facility] 11/03/16 at this time was on 50m/d pred and needing oxygen 3-4 as high as 8 liters for exertion). At baseline can't walk at normal pace w/out shortness of breath.   Admitted on 2/20 w/ cc: worsening shortness of breath. He noted the onset likely starting around 2/13. That day he went to pulmonary rehab and was noted to be 10lbs heavier than nml. They called his PCP who adjusted his lasix from every other day to daily. In spite of this he continued to have significant LE swelling and exertional dyspnea. On 2/14 he had a choking event while sitting at his computer which took some time to recover from. Over the next several days he felt like he wasn't improving all that much but also wasn't really that much worse than baseline w/ the exception of a cough that was a little worse than baseline. He ultimately called EMS on the day of admit really more at the insistence of his wife.   In the ER his initial pulse ox was 78% on 4 liters,   a CT of chest was obtained which showed severe emphysematous changed, chronic BTX  And scarring as well as CM and Right sided hypertrophy. He was admitted w/ the working dx of acute diastolic HF and decompensated cor pulmonale. He was aggressively diuresed and provided w/ oxygen. He made steady improvement w/ these measures alone. PCCM was asked to see at the patients request as he is followed by [facility]. On further questioning some additional key findings were discovered -he has had exertional shortness of breath chronically -often coughs w/ meals, has to rest and slow down to breath even when eating.  -on average may get up and move thru the house w/out oxygen around a dozen  times in a day.  -during these times he would get light-headed.  -checks oxygen and weight daily. Has had about 10lb gain, resting pulse ox on average is around 89% but suspects much lower when ambulatory. -he is largely asymptomatic even w/ sats in 735s RA mod dilated, RV severely dilated. Estimated PAP 61   STUDIES:  ECHO 2/20: EF 509-05%grade I diastolic dysfxn,  CT chest 2/20: no PE. Emphysema with pleuroparenchymal scarring and traction bronchiectasis of the lower lobes. Patchy area of ground-glass density in the right upper lobe, likely chronic and less likely an acute pneumonia. Clinical correlation is recommended. 3. Moderate cardiomegaly with evidence of right cardiac dysfunction.  PAST MEDICAL HISTORY :   has a past medical history of Chronic respiratory failure ([place]; Hypertension; Pulmonary fibrosis, postinflammatory ([facility]; and Rheumatoid arthritis(714.0).  has a past surgical history that includes Vasectomy (1973) and Acne cyst removal (1968). Prior to Admission medications   Medication Sig Start Date End Date Taking? Authorizing Provider  alendronate (FOSAMAX) 70 MG tablet Take 70 mg by mouth once a week. Take with a full glass of water on an empty stomach.   Yes Historical Provider, MD  aspirin 81 MG tablet Take 81 mg by mouth daily.   Yes Historical Provider, MD  atorvastatin (LIPITOR) 20 MG tablet Take 20 mg by mouth daily.  Yes Historical Provider, MD  Calcium Carbonate-Vit D-Min (CALCIUM 600+D PLUS MINERALS PO) Take 1 tablet by mouth 2 (two) times daily.   Yes Historical Provider, MD  chlorpheniramine (CHLOR-TRIMETON) 4 MG tablet Take 4 mg by mouth 2 (two) times daily as needed for allergies.   Yes Historical Provider, MD  famotidine (PEPCID) 20 MG tablet TAKE 1 TABLET BY MOUTH AT BEDTIME 11/13/16  Yes Tanda Rockers, MD  furosemide (LASIX) 20 MG tablet Take 20 mg by mouth daily.    Yes Historical Provider, MD  loratadine (CLARITIN) 10 MG tablet Take 10 mg by mouth daily.    Yes Historical Provider, MD  OXYGEN 2 lpm with sleep and as needed with exertion   Yes Historical Provider, MD  pantoprazole (PROTONIX) 40 MG tablet TAKE 1 TABLET BY MOUTH EVERY DAY *TAKE 30-60 MINUTES BEFORE FIRST MEAL OF THE DAY* 06/29/16  Yes Tanda Rockers, MD  predniSONE (DELTASONE) 10 MG tablet TAKE 2 DAILY UNTIL BETTER THEN ONE DAILY UNTIL RETURN 09/24/16  Yes Tanda Rockers, MD  Pseudoephedrine-Guaifenesin (MUCINEX D MAX STRENGTH) [id number] MG TB12 Take 1 tablet by mouth 2 (two) times daily.    Yes Historical Provider, MD  terazosin (HYTRIN) 1 MG capsule Take 4 mg by mouth at bedtime.    Yes Historical Provider, MD   Allergies  Allergen Reactions  . Lovastatin Rash    FAMILY HISTORY:  family history includes Heart disease in his father; Liver cancer in his mother. SOCIAL HISTORY:  reports that he quit smoking about 21 years ago. His smoking use included Cigarettes. He has a 36.00 pack-year smoking history. He has never used smokeless tobacco. He reports that he drinks about 1.8 oz of alcohol per week . He reports that he does not use drugs.  REVIEW OF SYSTEMS:   Constitutional: Negative for fever, chills, weight loss, malaise/fatigue and diaphoresis.  HENT:  hearing loss, ear pain, nosebleeds, congestion, sore throat, neck pain, tinnitus and ear discharge.   Eyes: Negative for blurred vision, double vision, photophobia, pain, discharge and redness.  Respiratory:  cough, hemoptysis, sputum production, shortness of breath, wheezing and stridor.   Cardiovascular: Negative for chest pain, palpitations, orthopnea, claudication, leg swelling and PND.  Gastrointestinal: Negative for heartburn, nausea, vomiting, abdominal pain, diarrhea, constipation, blood in stool and melena.  Genitourinary: Negative for dysuria, urgency, frequency, hematuria and flank pain.  Musculoskeletal: Negative for myalgias, back pain, joint pain and falls.  Skin: Negative for itching and rash.  Neurological:  Negative for dizziness, tingling, tremors, sensory change, speech change, focal weakness, seizures, loss of consciousness, weakness and headaches.  Endo/Heme/Allergies: Negative for environmental allergies and polydipsia. Does not bruise/bleed easily.  SUBJECTIVE:  Feels better VITAL SIGNS: Temp:  [97.8 F (36.6 C)-98.5 F (36.9 C)] 97.9 F (36.6 C) (02/21 1123) Pulse Rate:  [64-71] 64 (02/21 0600) Resp:  [11-21] 11 (02/21 0400) BP: (109-129)/(63-71) 129/71 (02/20 1957) SpO2:  [94 %-96 %] 95 % (02/21 0600)  PHYSICAL EXAMINATION: General:  Frail 71 year old male, no acute distress.  Neuro:  Awake, oriented. No focal def HEENT:  NCAT, no JVD  Cardiovascular:  RRR w/out MRG Lungs:  No accessory use. Crackles posterior and t/o  Abdomen:  Soft, not tender + bowel sounds  Musculoskeletal:  Equal st and bulk  Skin:  Warm and dry    Recent Labs Lab 12/07/16 2248 12/08/16 1036 12/09/16 0243  NA 137 140 141  K 4.0 3.9 3.4*  CL 104 107 101  CO2 19* 23  29  BUN 30* 26* 22*  CREATININE 1.47* 1.36* 1.24  GLUCOSE 102* 174* 92    Recent Labs Lab 12/08/16 0314 12/08/16 1036 12/09/16 0243  HGB 11.3* 12.0* 11.5*  HCT 35.5* 37.1* 35.8*  WBC 12.7* 12.2* 11.6*  PLT 254 246 265   Ct Angio Chest Pe W And/or Wo Contrast  Result Date: 12/08/2016 CLINICAL DATA:  71 year old male with increasing shortness of breath and cough. Concern for PE. EXAM: CT ANGIOGRAPHY CHEST WITH CONTRAST TECHNIQUE: Multidetector CT imaging of the chest was performed using the standard protocol during bolus administration of intravenous contrast. Multiplanar CT image reconstructions and MIPs were obtained to evaluate the vascular anatomy. CONTRAST:  80 cc Isovue 370 COMPARISON:  Chest radiograph dated  12/07/2016 FINDINGS: Cardiovascular: There is moderate cardiomegaly with enlargement of the right cardiac chambers. There is retrograde flow of contrast from the right atrium into the IVC compatible with a degree of  right cardiac dysfunction. Correlation with echocardiogram recommended. Calcified mitral annulus noted. No significant pericardial effusion. There is mild atherosclerotic calcification of the thoracic aorta. There is dilatation of the main pulmonary trunk suggestive of underlying pulmonary hypertension. Evaluation of the pulmonary arteries is limited due to suboptimal opacification of the distal branches. Areas of apparent decreased enhancement in the periphery of the distal branches of the lower lobe pulmonary arteries likely related to suboptimal enhancement and less likely may represent scarring or nonocclusive emboli. A nonopacified linear structure in the left lower lobe likely represents scarring within the vessel or in the lung parenchyma. No convincing or occlusive acute pulmonary embolus identified. Mediastinum/Nodes: There is no hilar adenopathy. Mildly enlarged right paratracheal lymph node measures 15 mm in short axis. Multiple small vascular collaterals noted in the posterior mediastinum. Esophagus and the thyroid gland are grossly unremarkable. Lungs/Pleura: There is severe emphysematous changes of the lungs with bibasilar pleuroparenchymal scarring and traction bronchiectasis. Triangular pleural based density along the right major fissure likely chronic and represents scarring. Patchy area of ground-glass density in the right upper lobe anteriorly may be chronic. Acute infiltrate is less likely but not entirely excluded. Clinical correlation is recommended there is no pleural effusion or pneumothorax. The central airways are patent. Upper Abdomen: Diffuse stranding of the upper abdominal mesentery. Musculoskeletal: Degenerative changes of the spine. No acute fracture. Review of the MIP images confirms the above findings. IMPRESSION: 1. No definite CT evidence of acute or occlusive PE. Suboptimal opacification of the peripheral branches limits evaluation. 2. Emphysema with pleuroparenchymal scarring  and traction bronchiectasis of the lower lobes. Patchy area of ground-glass density in the right upper lobe, likely chronic and less likely an acute pneumonia. Clinical correlation is recommended. 3. Moderate cardiomegaly with evidence of right cardiac dysfunction. Correlation with echocardiogram recommended. 4. Dilated pulmonary arteries suggestive of pulmonary hypertension. Electronically Signed   By: Anner Crete M.D.   On: 12/08/2016 01:13   Dg Chest Port 1 View  Result Date: 12/07/2016 CLINICAL DATA:  Acute onset of shortness of breath and dyspnea. Productive cough. Initial encounter. EXAM: PORTABLE CHEST 1 VIEW COMPARISON:  Chest radiograph performed 11/19/2015 FINDINGS: The lungs are well-aerated. Chronic fibrotic changes are again noted, slightly less well characterized due to motion artifact. There may be superimposed right midlung airspace opacity. There is no evidence of pleural effusion or pneumothorax. The cardiomediastinal silhouette is mildly enlarged. No acute osseous abnormalities are seen. IMPRESSION: 1. Question of superimposed acute right midlung airspace opacity, which could reflect pneumonia. 2. Underlying chronic bilateral fibrotic changes again noted. 3.  Mild cardiomegaly. Electronically Signed   By: Garald Balding M.D.   On: 12/07/2016 23:01    ASSESSMENT / PLAN: Acute on chronic respiratory failure Cor pulmonale Pulmonary edema Acute diastolic dysfunction  Volume overload ILD in setting of RA Chronic dysphagia and aspiration  Normocytic anemia BTX  Discussion  Acute on chronic respiratory failure in setting of acute diastolic Heart Failure, decompensated cor pulmonale , volume overload and pulmonary edema superimposed on underlying ILD -suspect that this is really more subacute and has been slowly progressive in setting of a) under-utilization of oxygen and b) chronic aspiration. He is much better and the only intervention has been diuresis  Plan/rec Cont lasix,  will need to go home on daily dosing Daily weights Resume daily pred SLP eval Must have walking oximetry prior to dc. Suspect he needs higher oxygen (likely 6-8 liters at all times).  Will set him up w/ f/u in our clinic  Erick Colace ACNP-BC [facility] Pager # [phone] OR # [phone] if no answer   12/09/2016, 12:24 PM

## 2016-12-09 NOTE — Progress Notes (Signed)
[facility] TEAM 1 - Stepdown/ICU TEAM  Dameir Gentzler  ZLD:[id number] DOB: 06-30-1946 DOA: 12/07/2016 PCP: Leonard Downing, MD    Brief Narrative:  71 y.o. male with history significant of post inflammatory pulmonary fibrosis, hypertension, rheumatoid arthritis, and hyperlipidemia who presented with complaints of progressively worse dyspnea for 3 days.  The patient is normally on 3-4 L of oxygen at home at rest and 8 liters while exerting. He denies fever, but complains of chills and fatigue.   In the ED CT angio chest did not show a PE but noted emphysema, bronchiectasis, cardiomegaly with right-sided heart dysfunction, and pulmonary hypertension.  Subjective:  The patient states he is feeling somewhat more stable then he did the time of his presentation.  Denies cp, n/v, or abdom pain.    Assessment & Plan:  Acute on chronic hypoxic respiratory failure  ?realted to sporadic episode of aspiration - seems to be settling down now - wean toward home O2 dose as able - pt asks that his Pulmonary MD be called   Grade 1 Diastolic CHF - Pulm HTN TTE notes EF 55-60% w/ grade 1 DD and no WMA but severely dilated RV w/ severe pulm HTN and mild AoS - baesline wgt appears to be ~61kg - cont diuresis to maintain baseline - no evidence of severe volume overload at this time   [facility] Weights   12/07/16 2150 12/08/16 0600  Weight: 62.6 kg (138 lb) 61 kg (134 lb 7.7 oz)    Postinflammatory pulmonary fibrosis Followed by Dr. Melvyn Novas - cont usual outpt tx - consult Pulm at pt request   RA Followed by Rheum as outpt   Essential hypertension BP well controlled  Hypokalemia  Due to diuretic - replace and follow   Elevated troponin Only mildly elevated w/ peak at 0.07 - no WMA on TTE - will not plan to investigate further  Hyperlipidemia  Normocytic Anemia Fe indices c/w Fe deficiency - needs outpt f/u to include GI cancer screening if up to date - will load w/ IV Fe while inpatient   DVT  prophylaxis: lovenox  Code Status: FULL CODE Family Communication: no family present at time of exam  Disposition Plan: SDU  Consultants:  Pulmonary   Procedures: none  Antimicrobials:  Anti-infectives    Start     Dose/Rate Route Frequency Ordered Stop   12/08/16 0630  levofloxacin (LEVAQUIN) IVPB 750 mg     750 mg 100 mL/hr over 90 Minutes Intravenous Every 48 hours 12/08/16 0404        Objective: Blood pressure 129/71, pulse 64, temperature 98.5 F (36.9 C), temperature source Oral, resp. rate 11, height _0  (1.651 m), weight 61 kg (134 lb 7.7 oz), SpO2 95 %.  Intake/Output Summary (Last 24 hours) at 12/09/16 0916 Last data filed at 12/09/16 0754  Gross per 24 hour  Intake                3 ml  Output             3325 ml  Net            -3322 ml   Filed Weights   12/07/16 2150 12/08/16 0600  Weight: 62.6 kg (138 lb) 61 kg (134 lb 7.7 oz)    Examination: General: No acute respiratory distress at rest in bed but requiring 8L [place] - cachectic Lungs: Very poor air movement throughout all fields with no active wheezing and very fine crackles diffusely Cardiovascular: Regular rate and  rhythm without murmur gallop or rub normal S1 and S2 Abdomen: Nontender, nondistended, soft, bowel sounds positive, no rebound, no ascites, no appreciable mass Extremities: No significant cyanosis, clubbing, or edema bilateral lower extremities  CBC:  Recent Labs Lab 12/07/16 2248 12/08/16 0314 12/08/16 1036 12/09/16 0243  WBC 12.0* 12.7* 12.2* 11.6*  NEUTROABS 9.4*  --  10.1*  --   HGB 11.7* 11.3* 12.0* 11.5*  HCT 35.6* 35.5* 37.1* 35.8*  MCV 86.4 86.6 86.5 86.9  PLT 258 254 246 998   Basic Metabolic Panel:  Recent Labs Lab 12/07/16 2248 12/08/16 0314 12/08/16 1036 12/09/16 0243  NA 137  --  140 141  K 4.0  --  3.9 3.4*  CL 104  --  107 101  CO2 19*  --  23 29  GLUCOSE 102*  --  174* 92  BUN 30*  --  26* 22*  CREATININE 1.47*  --  1.36* 1.24  CALCIUM 8.9  --  9.3  8.8*  MG  --  2.3  --   --   PHOS  --  3.2  --   --    GFR: Estimated Creatinine Clearance: 46.5 mL/min (by C-G formula based on SCr of 1.24 mg/dL).  Liver Function Tests:  Recent Labs Lab 12/07/16 2248 12/08/16 1036  AST 76* 74*  ALT 50 49  ALKPHOS 74 70  BILITOT 1.9* 1.8*  PROT 6.1* 6.1*  ALBUMIN 3.1* 3.0*    Cardiac Enzymes:  Recent Labs Lab 12/07/16 2248 12/08/16 0406 12/08/16 1036  TROPONINI 0.07* 0.07* 0.06*     Recent Results (from the past 240 hour(s))  MRSA PCR Screening     Status: None   Collection Time: 12/08/16  6:05 AM  Result Value Ref Range Status   MRSA by PCR NEGATIVE NEGATIVE Final    Comment:        The GeneXpert MRSA Assay (FDA approved for NASAL specimens only), is one component of a comprehensive MRSA colonization surveillance program. It is not intended to diagnose MRSA infection nor to guide or monitor treatment for MRSA infections.      Scheduled Meds: . enoxaparin (LOVENOX) injection  40 mg Subcutaneous Q24H  . furosemide  40 mg Intravenous BID  . guaiFENesin  600 mg Oral BID  . levofloxacin (LEVAQUIN) IV  750 mg Intravenous Q48H  . mouth rinse  15 mL Mouth Rinse BID  . sodium chloride flush  3 mL Intravenous Q12H     LOS: 1 day   Cherene Altes, MD [facility] Office  [phone] Pager - Text Page per Shea Evans as per below:  On-Call/Text Page:      [email or web address]      password [email or web address]  If 7PM-7AM, please contact night-coverage [email or web address] Password [email or web address] 12/09/2016, 9:16 AM

## 2016-12-10 ENCOUNTER — Encounter (HOSPITAL_COMMUNITY): Payer: Medicare Other

## 2016-12-10 DIAGNOSIS — I5031 Acute diastolic (congestive) heart failure: Secondary | ICD-10-CM

## 2016-12-10 DIAGNOSIS — J8410 Pulmonary fibrosis, unspecified: Secondary | ICD-10-CM

## 2016-12-10 DIAGNOSIS — D509 Iron deficiency anemia, unspecified: Secondary | ICD-10-CM

## 2016-12-10 DIAGNOSIS — J9620 Acute and chronic respiratory failure, unspecified whether with hypoxia or hypercapnia: Secondary | ICD-10-CM

## 2016-12-10 DIAGNOSIS — I10 Essential (primary) hypertension: Secondary | ICD-10-CM

## 2016-12-10 LAB — BASIC METABOLIC PANEL WITH GFR
Anion gap: 8 (ref 5–15)
BUN: 18 mg/dL (ref 6–20)
CO2: 30 mmol/L (ref 22–32)
Calcium: 9 mg/dL (ref 8.9–10.3)
Chloride: 101 mmol/L (ref 101–111)
Creatinine, Ser: 1.06 mg/dL (ref 0.61–1.24)
GFR calc Af Amer: >60 mL/min
GFR calc non Af Amer: >60 mL/min
Glucose, Bld: 77 mg/dL (ref 65–99)
Potassium: 3.8 mmol/L (ref 3.5–5.1)
Sodium: 139 mmol/L (ref 135–145)

## 2016-12-10 LAB — CBC
HCT: 37.4 % — ABNORMAL LOW (ref 39.0–52.0)
Hemoglobin: 11.7 g/dL — ABNORMAL LOW (ref 13.0–17.0)
MCH: 27.6 pg (ref 26.0–34.0)
MCHC: 31.3 g/dL (ref 30.0–36.0)
MCV: 88.2 fL (ref 78.0–100.0)
Platelets: 254 K/uL (ref 150–400)
RBC: 4.24 MIL/uL (ref 4.22–5.81)
RDW: 15.2 % (ref 11.5–15.5)
WBC: 10.5 K/uL (ref 4.0–10.5)

## 2016-12-10 LAB — MAGNESIUM: Magnesium: 2.1 mg/dL (ref 1.7–2.4)

## 2016-12-10 NOTE — Progress Notes (Signed)
Patient being transferred to 2W. Report called to receiving nurse. All questions answered. Family notified of transfer/room change.

## 2016-12-10 NOTE — Progress Notes (Signed)
[facility] PROGRESS NOTE  Marilyn Wing NHA:[id number] DOB: July 30, 1946 DOA: 12/07/2016  PCP: Leonard Downing, MD  Brief History/Interval Summary: 71 y.o.malewith history significant of post inflammatory pulmonary fibrosis, hypertension, rheumatoid arthritis, and hyperlipidemia who presented with complaints of progressively worse dyspnea for 3 days. The patient is normally on 2-4 L of oxygen at home at rest and 8 liters while exerting. In the ED CT angio chest did not show a PE but noted emphysema, bronchiectasis, cardiomegaly with right-sided heart dysfunction, and pulmonary hypertension.  Reason for Visit: Acute diastolic CHF  Consultants: Pulmonology  Procedures:  Transthoracic echocardiogram Study Conclusions  - Left ventricle: The cavity size was normal. Systolic function was   normal. The estimated ejection fraction was in the range of 55%   to 60%. Wall motion was normal; there were no regional wall   motion abnormalities. Doppler parameters are consistent with   abnormal left ventricular relaxation (grade 1 diastolic   dysfunction). - Ventricular septum: D-shaped interventricular septum suggestive   of RV pressure/volume overload. - Aortic valve: Trileaflet; moderately calcified leaflets. There   was mild stenosis. Mean gradient (S): 10 mm Hg. Valve area (VTI):   1.52 cm^2. - Mitral valve: There was no significant regurgitation. - Right ventricle: The cavity size was severely dilated. Systolic   function was moderately reduced. - Right atrium: The atrium was moderately dilated. - Tricuspid valve: Peak RV-RA gradient (S): 61 mm Hg. - Systemic veins: IVC not visualized. - Pericardium, extracardiac: A trivial pericardial effusion was   identified.  Impressions:  - Normal LV size with EF 55-60%. D-shaped interventricular septum   suggestive of RV pressure/volume overload. 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 Severe pulmonary  hypertension. Mild aortic stenosis.   Antibiotics: Patient was given Levaquin  Subjective/Interval History: Patient feels better. States that his shortness of breath has improved. He denies any chest pain. Continues to have some cough.  ROS: Denies nausea, vomiting  Objective:  Vital Signs  Vitals:   12/10/16 0800 12/10/16 0824 12/10/16 0900 12/10/16 1000  BP: (!) 96/57 (!) 96/57 108/78 106/72  Pulse: 64 73 80 82  Resp: 16 (!) 28 (!) 29 18  Temp:  97.7 F (36.5 C)    TempSrc:  Oral    SpO2: 96% 100% (!) 88% 98%  Weight:      Height:        Intake/Output Summary (Last 24 hours) at 12/10/16 1210 Last data filed at 12/10/16 1000  Gross per 24 hour  Intake              120 ml  Output             1050 ml  Net             -930 ml   Filed Weights   12/07/16 2150 12/08/16 0600  Weight: 62.6 kg (138 lb) 61 kg (134 lb 7.7 oz)    General appearance: alert, cooperative, appears stated age and no distress Head: Normocephalic, without obvious abnormality, atraumatic Resp: Crackles noted bilateral bases. No wheezing. No rhonchi. Normal effort. Cardio: regular rate and rhythm, S1, S2 normal, no murmur, click, rub or gallop GI: soft, non-tender; bowel sounds normal; no masses,  no organomegaly Extremities: extremities normal, atraumatic, no cyanosis or edema Neurologic: Awake and alert. Oriented 3. No focal neurological deficits.  Lab Results:  Data Reviewed: I have personally reviewed following labs and imaging studies  CBC:  Recent Labs Lab 12/07/16 2248 12/08/16 0314 12/08/16 1036  12/09/16 0243 12/10/16 0319  WBC 12.0* 12.7* 12.2* 11.6* 10.5  NEUTROABS 9.4*  --  10.1*  --   --   HGB 11.7* 11.3* 12.0* 11.5* 11.7*  HCT 35.6* 35.5* 37.1* 35.8* 37.4*  MCV 86.4 86.6 86.5 86.9 88.2  PLT 258 254 246 265 734    Basic Metabolic Panel:  Recent Labs Lab 12/07/16 2248 12/08/16 0314 12/08/16 1036 12/09/16 0243 12/10/16 0319  NA 137  --  140 141 139  K 4.0  --  3.9  3.4* 3.8  CL 104  --  107 101 101  CO2 19*  --  _0 GLUCOSE 102*  --  174* 92 77  BUN 30*  --  26* 22* 18  CREATININE 1.47*  --  1.36* 1.24 1.06  CALCIUM 8.9  --  9.3 8.8* 9.0  MG  --  2.3  --   --  2.1  PHOS  --  3.2  --   --   --     GFR: Estimated Creatinine Clearance: 54.4 mL/min (by C-G formula based on SCr of 1.06 mg/dL).  Liver Function Tests:  Recent Labs Lab 12/07/16 2248 12/08/16 1036  AST 76* 74*  ALT 50 49  ALKPHOS 74 70  BILITOT 1.9* 1.8*  PROT 6.1* 6.1*  ALBUMIN 3.1* 3.0*    Cardiac Enzymes:  Recent Labs Lab 12/07/16 2248 12/08/16 0406 12/08/16 1036  TROPONINI 0.07* 0.07* 0.06*    Anemia Panel:  Recent Labs  12/08/16 0406  VITAMINB12 664  FOLATE 28.4  FERRITIN 62  TIBC 330  IRON 13*  RETICCTPCT 1.5    Recent Results (from the past 240 hour(s))  MRSA PCR Screening     Status: None   Collection Time: 12/08/16  6:05 AM  Result Value Ref Range Status   MRSA by PCR NEGATIVE NEGATIVE Final    Comment:        The GeneXpert MRSA Assay (FDA approved for NASAL specimens only), is one component of a comprehensive MRSA colonization surveillance program. It is not intended to diagnose MRSA infection nor to guide or monitor treatment for MRSA infections.       Radiology Studies: No results found.   Medications:  Scheduled: . aspirin EC  81 mg Oral Daily  . enoxaparin (LOVENOX) injection  40 mg Subcutaneous Q24H  . famotidine  20 mg Oral QHS  . furosemide  40 mg Intravenous Daily  . guaiFENesin  600 mg Oral BID  . mouth rinse  15 mL Mouth Rinse BID  . pantoprazole  40 mg Oral Daily  . potassium chloride  40 mEq Oral Daily  . predniSONE  10 mg Oral Q breakfast  . senna-docusate  2 tablet Oral QHS  . sodium chloride flush  3 mL Intravenous Q12H   Continuous:  [id number], albuterol, traMADol  Assessment/Plan:  Principal Problem:   Acute on chronic respiratory failure (HCC) Active Problems:   Postinflammatory  pulmonary fibrosis (HCC)   Cough   Essential hypertension   Fluid overload   Elevated troponin   Hyperlipidemia   Anemia    Acute on chronic hypoxic respiratory failure  Thought to be related to sporadic episode of aspiration. Might have been an element of acute diastolic CHF as well. Patient has significantly improved. He has diuresed well. Swallow evaluation has been obtained. Appreciate pulmonology input. Try to wean down oxygen.  Acute Grade 1 Diastolic CHF - Pulm HTN TTE notes EF 55-60% w/ grade 1 DD and no WMA but  severely dilated RV w/ severe pulm HTN and mild AoS. Baseline wgt appears to be ~61kg. Marland Kitchen Continue IV Lasix for another day.  Postinflammatory pulmonary fibrosis Followed by Dr. Melvyn Novas. Appears to be stable.  History of rheumatoid arthritis  Followed by Rheum as outpt. Noted to be on prednisone at home.  Essential hypertension BP well controlled  Hypokalemia  Repleted. On daily potassium while on diuretics.  Elevated troponin Only mildly elevated w/ peak at 0.07 - no WMA on TTE - will not plan to investigate further  Hyperlipidemia Stable  Normocytic Anemia Patient's iron was 13, ferritin 62, TIBC 3:30. Patient was given 9 during this hospitalization. Will need outpatient follow-up for further workup. Hemoglobin is stable. No overt bleeding.   DVT Prophylaxis: Lovenox    Code Status: Full code  Family Communication: Discussed with the patient  Disposition Plan: Mobilize. Okay for transfer to telemetry. PT eval.    LOS: 2 days   [facility] Pager [phone] 12/10/2016, 12:10 PM  If 7PM-7AM, please contact night-coverage at [email or web address], password [facility]

## 2016-12-10 NOTE — Progress Notes (Signed)
   Name: Gilbert Dominguez MRN: [id number] DOB: Jul 05, 1946    ADMISSION DATE:  12/07/2016 CONSULTATION DATE: 2/11  REFERRING MD :  Thereasa Solo   CHIEF COMPLAINT: Dyspnea   BRIEF PATIENT DESCRIPTION:  71 year old male who is followed by Dr Melvyn Novas since 2015 w/ ILD felt to be r/t RA. At baseline is on oxygen & Pred (last visit w/ Wert 11/03/16 at this time was on 80m/d pred and needing oxygen 3-4 as high as 8 liters for exertion). At baseline can't walk at normal pace w/out shortness of breath.  Admitted on 2/20 w/ cc: worsening shortness of breath. In the ER his initial pulse ox was 78% on 4 liters, a CT of chest was obtained which showed severe emphysematous changed, chronic BTX  And scarring as well as CM and Right sided hypertrophy. He was admitted w/ the working dx of acute diastolic HF and decompensated cor pulmonale. He was aggressively diuresed and provided w/ oxygen. PCCM asked to see the patient at his request, as he is followed by [facility].   STUDIES:  ECHO 2/20: EF 522-29%grade I diastolic dysfxn,  CT chest 2/20: no PE. Emphysema with pleuroparenchymal scarring and traction bronchiectasis of the lower lobes. Patchy area of ground-glass density in the right upper lobe, likely chronic and less likely an acute pneumonia. Clinical correlation is recommended. Moderate cardiomegaly with evidence of right cardiac dysfunction.  SUBJECTIVE:  Remains on 6L [place]. Patient feels like he is slowly starting to improve   VITAL SIGNS: Temp:  [97.1 F (36.2 C)-97.9 F (36.6 C)] 97.7 F (36.5 C) (02/22 0824) Pulse Rate:  [61-84] 73 (02/22 0824) Resp:  [12-28] 28 (02/22 0824) BP: (96-129)/(57-91) 96/57 (02/22 0824) SpO2:  [88 %-100 %] 100 % (02/22 0824)  PHYSICAL EXAMINATION: General: Adult male, no distress, lying in bed  Neuro:  Alert, oriented, follows commands  HEENT:  Normocephalic  Cardiovascular: RRR, no MRG, NI S1/S2 Lungs:  Clear breath sounds, non-labored  Abdomen:  Active bowel  sounds, non-tender, non-distended  Musculoskeletal:  No acute Skin:  Warm, dry, intact    Recent Labs Lab 12/08/16 1036 12/09/16 0243 12/10/16 0319  NA 140 141 139  K 3.9 3.4* 3.8  CL 107 101 101  CO2 _0 BUN 26* 22* 18  CREATININE 1.36* 1.24 1.06  GLUCOSE 174* 92 77    Recent Labs Lab 12/08/16 1036 12/09/16 0243 12/10/16 0319  HGB 12.0* 11.5* 11.7*  HCT 37.1* 35.8* 37.4*  WBC 12.2* 11.6* 10.5  PLT 246 265 254   No results found.  ASSESSMENT / PLAN:  Acute on Chronic Hypoxic Respiratory Failure in setting of acute Diastolic Heart Failure, decompensated cor pulmonale, and pulmonary edema superimposed on underlying ILD -Baseline 3-4L [place] Plan -Continue Diuresis  -Continue PRN albuterol  -Continue Prednisone  -Needs walking oximetry prior to discharge home (spoke with patient about need for increased oxygen requirements) -Will follow up with Dr. WMelvyn Novason 3/16 at 3 pm   Spoke with patient about outpatient follow up appointment, will sign off now, if needed again please call back.   KHayden Pedro AG-ACNP [facility]  Pgr: [phone] PCCM Pgr: [phone]

## 2016-12-10 NOTE — Evaluation (Signed)
Physical Therapy Evaluation Patient Details Name: Gilbert Dominguez MRN: [id number] DOB: 1946/07/28 Today's Date: 12/10/2016   History of Present Illness  Pt adm with acute on chronic respiratory failure due to volume overload. Pt also with Grade 1 Diastolic CHF - Pulm HTN. PMH - post inflammatory pulmonary fibrosis, hypertension, rheumatoid arthritis  Clinical Impression  Pt presents to PT with slightly unsteady gait likely due to inactivity over past several days. Pt also with decreased SpO2 with activity. At rest pt with SpO2 of 95% on 6L. Began amb on 6L with SpO2 dropping to 84%. Incr O2 to 8L. Pt took several standing rest breaks at my request to allow SpO2 to rebound. Pt conversant throughout amb unless I asked him to stop and concentrate on his breathing. At end of amb pt with SpO2 of 79%. Pt sat and after several minutes SpO2 returned to 89%. Pt with dyspnea 2-3/4 throughout but did not appear distressed and continued to converse throughout amb. Suspect this pt's baseline SpO2 levels may be low.    Follow Up Recommendations Other (comment) (Resume pulmonary rehab)    Equipment Recommendations  None recommended by PT    Recommendations for Other Services       Precautions / Restrictions Precautions Precautions: Other (comment) (watch SpO2) Restrictions Weight Bearing Restrictions: No      Mobility  Bed Mobility Overal bed mobility: Modified Independent                Transfers Overall transfer level: Needs assistance Equipment used: None Transfers: Sit to/from Stand Sit to Stand: Min guard         General transfer comment: Assist for balance  Ambulation/Gait Ambulation/Gait assistance: Min guard Ambulation Distance (Feet): 275 Feet Assistive device: None Gait Pattern/deviations: Step-through pattern;Decreased stride length;Drifts right/left Gait velocity: decr Gait velocity interpretation: Below normal speed for age/gender General Gait Details: Slightly  unsteady gait without overt loss of balance. Began amb on 6L of O2. SpO2 dropped to 84% so incr O2 to 8L. Took 2-3 standing rest breaks. Pt conversant throughout unless cued to concentrate on breathing. Dyspnea 3/4. SpO2 79% at end of amb.  Stairs            Wheelchair Mobility    Modified Rankin (Stroke Patients Only)       Balance Overall balance assessment: Needs assistance Sitting-balance support: No upper extremity supported;Feet supported Sitting balance-Leahy Scale: Normal     Standing balance support: No upper extremity supported Standing balance-Leahy Scale: Fair                               Pertinent Vitals/Pain Pain Assessment: No/denies pain    Home Living Family/patient expects to be discharged to:: Private residence Living Arrangements: Spouse/significant other Available Help at Discharge: Family;Available PRN/intermittently Type of Home: House Home Access: Stairs to enter   Entrance Stairs-Number of Steps: 1 Home Layout: One level Home Equipment: Other (comment) (home O2) Additional Comments: wife is on HD.    Prior Function Level of Independence: Independent         Comments: Pt goes to pulmonary rehab 2x/wk. Also involved in several volunteer activities. Used O2. 3L at rest. 3-8L with activity.     Hand Dominance        Extremity/Trunk Assessment   Upper Extremity Assessment Upper Extremity Assessment: Overall WFL for tasks assessed    Lower Extremity Assessment Lower Extremity Assessment: Overall WFL for tasks assessed  Communication   Communication: HOH  Cognition Arousal/Alertness: Awake/alert Behavior During Therapy: WFL for tasks assessed/performed Overall Cognitive Status: Within Functional Limits for tasks assessed                      General Comments      Exercises     Assessment/Plan    PT Assessment Patient needs continued PT services  PT Problem List Decreased balance;Decreased  mobility;Cardiopulmonary status limiting activity       PT Treatment Interventions Gait training;Therapeutic activities;Balance training;Therapeutic exercise;Patient/family education    PT Goals (Current goals can be found in the Care Plan section)  Acute Rehab PT Goals Patient Stated Goal: Return to prior level of function PT Goal Formulation: With patient Time For Goal Achievement: 12/17/16 Potential to Achieve Goals: Good    Frequency Min 3X/week   Barriers to discharge        Co-evaluation               End of Session Equipment Utilized During Treatment: Oxygen Activity Tolerance: Other (comment) (Decr SpO2) Patient left: in chair Nurse Communication: Mobility status;Other (comment) (SpO2) PT Visit Diagnosis: Unsteadiness on feet (R26.81)         Time: 1430-1456 PT Time Calculation (min) (ACUTE ONLY): 26 min   Charges:   PT Evaluation $PT Eval Moderate Complexity: 1 Procedure PT Treatments $Gait Training: 8-22 mins   PT G CodesShary Decamp [place]ok 12/13/2016, 4:00 PM [employer] PT [phone]

## 2016-12-10 NOTE — Evaluation (Signed)
Clinical/Bedside Swallow Evaluation Patient Details  Name: Gilbert Dominguez MRN: [id number] Date of Birth: 05-29-1946  Today's Date: 12/10/2016 Time: SLP Start Time (ACUTE ONLY): 0830 SLP Stop Time (ACUTE ONLY): 0838 SLP Time Calculation (min) (ACUTE ONLY): 8 min  Past Medical History:  Past Medical History:  Diagnosis Date  . Chronic respiratory failure ([place])   . Hypertension   . Pulmonary fibrosis, postinflammatory ([place])   . Rheumatoid arthritis(714.0)    Past Surgical History:  Past Surgical History:  Procedure Laterality Date  . ACNE CYST REMOVAL  1968  . VASECTOMY  19769   HPI:  71 year old with RA-ILD, chronic respiratory failure on home O2 and baseline prednisone 10 mg. Admitted on 12/20 with worsening dyspnea, lower extremity swelling, weight gain. Pt with volume overload, severe pulmonary HTN, has improved with lasix. He also noted to have choking/aspiration on 2/14, but pt clarifies that he was not eating at the time, he just took in a deep breath and started coughing. CT shows Emphysema with pleuroparenchymal scarring and traction   Assessment / Plan / Recommendation Clinical Impression  Pt demonstrates normal swallow function, no signs of aspiration or dysphagia. He denies any difficulty eating and drinking in the past and reports the choking episode on 2/14 was with his slaiva during a deep breath; he was not eating or drinking at the tim. SLP administerd 3 oz of wtaer consecutively through a straw with no difficulty. Reinforced basic precautions given history of respiratory impairement and need for supplemental O2, but no diet modification or f/u needed, will sign off.  SLP Visit Diagnosis: Dysphagia, unspecified (R13.10)    Aspiration Risk  Mild aspiration risk    Diet Recommendation Regular;Thin liquid   Liquid Administration via: Cup;Straw Medication Administration: Whole meds with liquid Supervision: Patient able to self feed Postural Changes: Seated upright at  90 degrees    Other  Recommendations     Follow up Recommendations None      Frequency and Duration            Prognosis        Swallow Study   General HPI: 71 year old with RA-ILD, chronic respiratory failure on home O2 and baseline prednisone 10 mg. Admitted on 12/20 with worsening dyspnea, lower extremity swelling, weight gain. Pt with volume overload, severe pulmonary HTN, has improved with lasix. He also noted to have choking/aspiration on 2/14, but pt clarifies that he was not eating at the time, he just took in a deep breath and started coughing. CT shows Emphysema with pleuroparenchymal scarring and traction Type of Study: Bedside Swallow Evaluation Previous Swallow Assessment: none Diet Prior to this Study: Regular;Thin liquids Temperature Spikes Noted: No Respiratory Status: Nasal cannula History of Recent Intubation: No Behavior/Cognition: Alert;Cooperative;Pleasant mood Oral Cavity Assessment: Within Functional Limits Oral Care Completed by SLP: No Oral Cavity - Dentition: Adequate natural dentition Vision: Functional for self-feeding Self-Feeding Abilities: Able to feed self Patient Positioning: Upright in bed Baseline Vocal Quality: Normal Volitional Cough: Strong Volitional Swallow: Able to elicit    Oral/Motor/Sensory Function Overall Oral Motor/Sensory Function: Within functional limits   Ice Chips     Thin Liquid Thin Liquid: Within functional limits Presentation: Cup;Straw;Self Fed    Nectar Thick Nectar Thick Liquid: Not tested   Honey Thick Honey Thick Liquid: Not tested   Puree Puree: Within functional limits   Solid   GO   Solid: Within functional limits       [facility], [place] CCC-SLP [phone]  Gilbert Dominguez, Gilbert Dominguez 12/10/2016,10:10 AM

## 2016-12-11 LAB — BASIC METABOLIC PANEL
Anion gap: 5 (ref 5–15)
BUN: 18 mg/dL (ref 6–20)
CALCIUM: 9.1 mg/dL (ref 8.9–10.3)
CO2: 33 mmol/L — ABNORMAL HIGH (ref 22–32)
Chloride: 100 mmol/L — ABNORMAL LOW (ref 101–111)
Creatinine, Ser: 1.13 mg/dL (ref 0.61–1.24)
GFR calc Af Amer: >60 mL/min (ref >60)
GLUCOSE: 97 mg/dL (ref 65–99)
Potassium: 4 mmol/L (ref 3.5–5.1)
Sodium: 138 mmol/L (ref 135–145)

## 2016-12-11 LAB — CBC
HCT: 38.3 % — ABNORMAL LOW (ref 39.0–52.0)
Hemoglobin: 11.9 g/dL — ABNORMAL LOW (ref 13.0–17.0)
MCH: 27.4 pg (ref 26.0–34.0)
MCHC: 31.1 g/dL (ref 30.0–36.0)
MCV: 88.2 fL (ref 78.0–100.0)
PLATELETS: 277 10*3/uL (ref 150–400)
RBC: 4.34 MIL/uL (ref 4.22–5.81)
RDW: 14.8 % (ref 11.5–15.5)
WBC: 10.7 10*3/uL — ABNORMAL HIGH (ref 4.0–10.5)

## 2016-12-11 MED ORDER — FUROSEMIDE 40 MG PO TABS
40.0000 mg | ORAL_TABLET | Freq: Every day | ORAL | Status: DC
  Administered 2016-12-12: 40 mg via ORAL
  Filled 2016-12-11: qty 1

## 2016-12-11 NOTE — Progress Notes (Signed)
Physical Therapy Treatment Patient Details Name: Gilbert Dominguez MRN: [id number] DOB: 1946/06/08 Today's Date: 12/11/2016    History of Present Illness Pt adm with acute on chronic respiratory failure due to volume overload. Pt also with Grade 1 Diastolic CHF - Pulm HTN. PMH - post inflammatory pulmonary fibrosis, hypertension, rheumatoid arthritis    PT Comments    Nursing weaning O2 requirements. Pt on 4L O2 at start of session-sats >90% at rest. Began ambulation on 4L O2 but quickly had to increase to 6L. Walked x 2 with seated rest break between walks. O2 sats dropped to 78% on 6L O2 during ambulation. At end of session, after allowing pt time to recover, returned pt to 4L O2. Sats hovering around 86-88% so increased to 5L O2-sats up to 91%. Made NT aware.     Follow Up Recommendations   (Resume pulmonary rehab)     Equipment Recommendations  None recommended by PT    Recommendations for Other Services       Precautions / Restrictions Precautions Precaution Comments: monitor O2 sats Restrictions Weight Bearing Restrictions: No    Mobility  Bed Mobility Overal bed mobility: Modified Independent                Transfers     Transfers: Sit to/from Stand Sit to Stand: Supervision         General transfer comment: initially for safety. No LOB.   Ambulation/Gait Ambulation/Gait assistance: Supervision Ambulation Distance (Feet): 175 Feet (x 2) Assistive device: None (pushed dynamap-did not rely on it) Gait Pattern/deviations: Step-through pattern;Decreased stride length     General Gait Details: Intermittently unsteady but no overt LOB. Began on 4L O2 but quickly had to increase to 6L O2. O2 sats dropped to 78%. 1 standing rest break during each walk. 1 seated rest break between walks. Cues for pursed lip/deep breathing.    Stairs            Wheelchair Mobility    Modified Rankin (Stroke Patients Only)       Balance                                    Cognition Arousal/Alertness: Awake/alert Behavior During Therapy: WFL for tasks assessed/performed Overall Cognitive Status: Within Functional Limits for tasks assessed                      Exercises      General Comments        Pertinent Vitals/Pain Pain Assessment: No/denies pain    Home Living                      Prior Function            PT Goals (current goals can now be found in the care plan section) Progress towards PT goals: Progressing toward goals    Frequency    Min 3X/week      PT Plan Current plan remains appropriate    Co-evaluation             End of Session Equipment Utilized During Treatment: Oxygen Activity Tolerance:  (Decreased O2 sats) Patient left: in bed;with call bell/phone within reach   PT Visit Diagnosis: Difficulty in walking, not elsewhere classified (R26.2)     Time: 5701-7793 PT Time Calculation (min) (ACUTE ONLY): 27 min  Charges:  $Gait Training: 23-37 mins  G Codes:       Weston Anna, MPT Pager: [phone]

## 2016-12-11 NOTE — Progress Notes (Signed)
[facility] PROGRESS NOTE  Gilbert Dominguez AVW:[id number] DOB: [birth year] DOA: 12/07/2016  PCP: Leonard Downing, MD  Brief History/Interval Summary: 71 y.o.malewith history significant of post inflammatory pulmonary fibrosis, hypertension, rheumatoid arthritis, and hyperlipidemia who presented with complaints of progressively worse dyspnea for 3 days. The patient is normally on 2-4 L of oxygen at home at rest and 8 liters while exerting. In the ED CT angio chest did not show a PE but noted emphysema, bronchiectasis, cardiomegaly with right-sided heart dysfunction, and pulmonary hypertension.  Reason for Visit: Acute diastolic CHF  Consultants: Pulmonology  Procedures:  Transthoracic echocardiogram Study Conclusions  - Left ventricle: The cavity size was normal. Systolic function was   normal. The estimated ejection fraction was in the range of 55%   to 60%. Wall motion was normal; there were no regional wall   motion abnormalities. Doppler parameters are consistent with   abnormal left ventricular relaxation (grade 1 diastolic   dysfunction). - Ventricular septum: D-shaped interventricular septum suggestive   of RV pressure/volume overload. - Aortic valve: Trileaflet; moderately calcified leaflets. There   was mild stenosis. Mean gradient (S): 10 mm Hg. Valve area (VTI):   1.52 cm^2. - Mitral valve: There was no significant regurgitation. - Right ventricle: The cavity size was severely dilated. Systolic   function was moderately reduced. - Right atrium: The atrium was moderately dilated. - Tricuspid valve: Peak RV-RA gradient (S): 61 mm Hg. - Systemic veins: IVC not visualized. - Pericardium, extracardiac: A trivial pericardial effusion was   identified.  Impressions:  - Normal LV size with EF 55-60%. D-shaped interventricular septum   suggestive of RV pressure/volume overload. 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 Severe pulmonary  hypertension. Mild aortic stenosis.   Antibiotics: Patient was given Levaquin initially but not continued  Subjective/Interval History: Patient feels much better. His breathing has significantly improved. He denies any chest pain.   ROS: Denies nausea, vomiting  Objective:  Vital Signs  Vitals:   12/11/16 0442 12/11/16 0935 12/11/16 0947 12/11/16 0953  BP: 109/67     Pulse: 66     Resp: 18     Temp: 98 F (36.7 C)     TempSrc: Oral     SpO2: 100% 100% 100% 97%  Weight:    56.7 kg (125 lb)  Height:        Intake/Output Summary (Last 24 hours) at 12/11/16 1126 Last data filed at 12/11/16 0857  Gross per 24 hour  Intake                0 ml  Output             1100 ml  Net            -1100 ml   Filed Weights   12/07/16 2150 12/08/16 0600 12/11/16 0953  Weight: 62.6 kg (138 lb) 61 kg (134 lb 7.7 oz) 56.7 kg (125 lb)    General appearance: alert, cooperative, appears stated age and no distress Resp: Improved air entry bilaterally. However, continues to have crackles in bilateral bases. No rhonchi. Normal effort. Cardio: regular rate and rhythm, S1, S2 normal, no murmur, click, rub or gallop GI: soft, non-tender; bowel sounds normal; no masses,  no organomegaly Extremities: extremities normal, atraumatic, no cyanosis or edema Neurologic: Awake and alert. Oriented 3. No focal neurological deficits.  Lab Results:  Data Reviewed: I have personally reviewed following labs and imaging studies  CBC:  Recent Labs Lab 12/07/16 2248 12/08/16  [id number] 12/08/16 1036 12/09/16 0243 12/10/16 0319 12/11/16 0318  WBC 12.0* 12.7* 12.2* 11.6* 10.5 10.7*  NEUTROABS 9.4*  --  10.1*  --   --   --   HGB 11.7* 11.3* 12.0* 11.5* 11.7* 11.9*  HCT 35.6* 35.5* 37.1* 35.8* 37.4* 38.3*  MCV 86.4 86.6 86.5 86.9 88.2 88.2  PLT 258 254 246 265 254 696    Basic Metabolic Panel:  Recent Labs Lab 12/07/16 2248 12/08/16 0314 12/08/16 1036 12/09/16 0243 12/10/16 0319 12/11/16 0318  NA  137  --  140 141 139 138  K 4.0  --  3.9 3.4* 3.8 4.0  CL 104  --  107 101 101 100*  CO2 19*  --  _0 33*  GLUCOSE 102*  --  174* 92 77 97  BUN 30*  --  26* 22* 18 18  CREATININE 1.47*  --  1.36* 1.24 1.06 1.13  CALCIUM 8.9  --  9.3 8.8* 9.0 9.1  MG  --  2.3  --   --  2.1  --   PHOS  --  3.2  --   --   --   --     GFR: Estimated Creatinine Clearance: 47.4 mL/min (by C-G formula based on SCr of 1.13 mg/dL).  Liver Function Tests:  Recent Labs Lab 12/07/16 2248 12/08/16 1036  AST 76* 74*  ALT 50 49  ALKPHOS 74 70  BILITOT 1.9* 1.8*  PROT 6.1* 6.1*  ALBUMIN 3.1* 3.0*    Cardiac Enzymes:  Recent Labs Lab 12/07/16 2248 12/08/16 0406 12/08/16 1036  TROPONINI 0.07* 0.07* 0.06*     Recent Results (from the past 240 hour(s))  MRSA PCR Screening     Status: None   Collection Time: 12/08/16  6:05 AM  Result Value Ref Range Status   MRSA by PCR NEGATIVE NEGATIVE Final    Comment:        The GeneXpert MRSA Assay (FDA approved for NASAL specimens only), is one component of a comprehensive MRSA colonization surveillance program. It is not intended to diagnose MRSA infection nor to guide or monitor treatment for MRSA infections.       Radiology Studies: No results found.   Medications:  Scheduled: . aspirin EC  81 mg Oral Daily  . enoxaparin (LOVENOX) injection  40 mg Subcutaneous Q24H  . famotidine  20 mg Oral QHS  . furosemide  40 mg Intravenous Daily  . guaiFENesin  600 mg Oral BID  . mouth rinse  15 mL Mouth Rinse BID  . pantoprazole  40 mg Oral Daily  . potassium chloride  40 mEq Oral Daily  . predniSONE  10 mg Oral Q breakfast  . senna-docusate  2 tablet Oral QHS  . sodium chloride flush  3 mL Intravenous Q12H   Continuous:  VEL:[id number], albuterol, traMADol  Assessment/Plan:  Principal Problem:   Acute on chronic respiratory failure (HCC) Active Problems:   Postinflammatory pulmonary fibrosis (HCC)   Cough   Essential  hypertension   Fluid overload   Elevated troponin   Hyperlipidemia   Anemia    Acute on chronic hypoxic respiratory failure  Thought to be related to sporadic episode of aspiration. Might have had an element of acute diastolic CHF as well. Patient has significantly improved. He has diuresed well. Swallow evaluation has been obtained. Appreciate pulmonology input. Try to wean down oxygen. At baseline he uses 3-4 L of oxygen at rest and increases to 8 L/m with exertion.  Acute Grade  1 Diastolic CHF - Pulm HTN TTE notes EF 55-60% w/ grade 1 DD and no WMA but severely dilated RV w/ severe pulm HTN and mild AoS. Baseline wgt appears to be ~61kg. Patient's weight has decreased. Changed to oral Lasix.  Postinflammatory pulmonary fibrosis Followed by Dr. Melvyn Novas. Appears to be stable. Will resume pulmonary rehabilitation at discharge which he was going for twice a week. Noted to be on prednisone at home.  History of rheumatoid arthritis  Followed by Rheum as outpt.   Essential hypertension BP well controlled  Hypokalemia  Repleted. On daily potassium while on diuretics.  Elevated troponin Only mildly elevated w/ peak at 0.07 - no WMA on TTE - will not plan to investigate further  Hyperlipidemia Stable  Normocytic Anemia Patient's iron was 13, ferritin 62, TIBC 3:30. Patient was given 9 during this hospitalization. Will need outpatient follow-up for further workup. Hemoglobin is stable. No overt bleeding.   DVT Prophylaxis: Lovenox    Code Status: Full code  Family Communication: Discussed with the patient  Disposition Plan: Patient improving. Changed to oral Lasix. Anticipate discharge tomorrow.    LOS: 3 days   [facility] Pager [phone] 12/11/2016, 11:26 AM  If 7PM-7AM, please contact night-coverage at [email or web address], password [facility]

## 2016-12-12 MED ORDER — TERAZOSIN HCL 1 MG PO CAPS: 2.0000 mg | ORAL_CAPSULE | Freq: Every day | ORAL | 0 refills | Status: DC

## 2016-12-12 MED ORDER — POTASSIUM CHLORIDE CRYS ER 20 MEQ PO TBCR: 20.0000 meq | EXTENDED_RELEASE_TABLET | Freq: Every day | ORAL | 0 refills | Status: AC

## 2016-12-12 MED ORDER — FUROSEMIDE 20 MG PO TABS: 40.0000 mg | ORAL_TABLET | Freq: Every day | ORAL | 0 refills | Status: DC

## 2016-12-12 NOTE — Discharge Summary (Signed)
[facility]  Physician Discharge Summary   Patient ID: Gilbert Dominguez MRN: [id number] DOB/AGE: 1946/05/20 71 y.o.  Admit date: 12/07/2016 Discharge date: 12/12/2016  PCP: Leonard Downing, MD  DISCHARGE DIAGNOSES:  Principal Problem:   Acute on chronic respiratory failure ([place]) Active Problems:   Postinflammatory pulmonary fibrosis (HCC)   Cough   Essential hypertension   Fluid overload   Elevated troponin   Hyperlipidemia   Anemia   RECOMMENDATIONS FOR OUTPATIENT FOLLOW UP: 1. Dose of terazocin has been reduced due to borderline low blood pressure. 2. Dose of Lasix has been increased. 3. Patient may continue with pulmonary rehabilitation. 4. Needs outpatient workup for iron deficiency anemia.  DISCHARGE CONDITION: fair  Diet recommendation: As before  [facility] Weights   12/07/16 2150 12/08/16 0600 12/11/16 0953  Weight: 62.6 kg (138 lb) 61 kg (134 lb 7.7 oz) 56.7 kg (125 lb)    INITIAL HISTORY: 70 y.o.malewith history significant of post inflammatory pulmonary fibrosis, hypertension, rheumatoid arthritis, and hyperlipidemia who presented with complaints of progressively worse dyspnea for 3 days. The patient is normally on 2-4 L of oxygen at home at rest and 8 liters while exerting. In the ED CT angio chest did not show a PE but noted emphysema, bronchiectasis, cardiomegaly with right-sided heart dysfunction,and pulmonary hypertension.  Consultations:  Pulmonology  Procedures: Transthoracic echocardiogram Study Conclusions  - Left ventricle: The cavity size was normal. Systolic function was normal. The estimated ejection fraction was in the range of 55% to 60%. Wall motion was normal; there were no regional wall motion abnormalities. Doppler parameters are consistent with abnormal left ventricular relaxation (grade 1 diastolic dysfunction). - Ventricular septum: D-shaped interventricular septum suggestive of RV pressure/volume  overload. - Aortic valve: Trileaflet; moderately calcified leaflets. There was mild stenosis. Mean gradient (S): 10 mm Hg. Valve area (VTI): 1.52 cm^2. - Mitral valve: There was no significant regurgitation. - Right ventricle: The cavity size was severely dilated. Systolic function was moderately reduced. - Right atrium: The atrium was moderately dilated. - Tricuspid valve: Peak RV-RA gradient (S): 61 mm Hg. - Systemic veins: IVC not visualized. - Pericardium, extracardiac: A trivial pericardial effusion was identified.  Impressions:  - Normal LV size with EF 55-60%. D-shaped interventricular septum suggestive of RV pressure/volume overload. Severely dilated RV with moderate systolic dysfunction. Severe pulmonary hypertension. Mild aortic stenosis.   HOSPITAL COURSE:   Acute on chronic hypoxic respiratory failure  Thought to be related to sporadic episode of aspiration. Might have had an element of acute diastolic CHF as well. Patient has significantly improved. CT scan did not show any pulmonary embolism. He has diuresed well. Swallow evaluation has been obtained. Patient was also seen by pulmonology. He has significantly improved. He is on oxygen at home. At baseline he uses 3-4 L of oxygen at rest and increases to 8 L/m with exertion.  Acute Grade 1 Diastolic CHF - Pulm HTN TTE notes EF 55-60% w/ grade 1 DD and no WMA but severely dilated RV w/ severe pulm HTN and mild AoS. Patient has diuresed well. He was transitioned to oral Lasix. He is on 20 mg Lasix on a daily basis at home. This has been increased to 40 mg. He will also be given a prescription for potassium.  Postinflammatory pulmonary fibrosis and history of emphysema Followed by Dr. Melvyn Novas. Appears to be stable. Will resume pulmonary rehabilitation at discharge which he was going for twice a week. Noted to be on prednisone at home.  History of rheumatoid arthritis  Followed  by Rheum as outpt.    Essential hypertension BP well controlled. Episodes of low pressures noted during which patient was asymptomatic. Dose of Hytrin was reduced.  Hypokalemia  Repleted. On daily potassium while on diuretics.  Elevated troponin Only mildly elevated w/ peak at 0.07 - no WMA on TTE. Did not need any further workup.  Hyperlipidemia Stable  Normocytic Anemia Patient's iron was 13, ferritin 62, TIBC 3:30. Patient was given iron during this hospitalization. Will need outpatient follow-up for further workup. Hemoglobin is stable. No overt bleeding.  Overall much improved. Stable for discharge home today.    PERTINENT LABS:  The results of significant diagnostics from this hospitalization (including imaging, microbiology, ancillary and laboratory) are listed below for reference.    Microbiology: Recent Results (from the past 240 hour(s))  MRSA PCR Screening     Status: None   Collection Time: 12/08/16  6:05 AM  Result Value Ref Range Status   MRSA by PCR NEGATIVE NEGATIVE Final    Comment:        The GeneXpert MRSA Assay (FDA approved for NASAL specimens only), is one component of a comprehensive MRSA colonization surveillance program. It is not intended to diagnose MRSA infection nor to guide or monitor treatment for MRSA infections.      Labs: Basic Metabolic Panel:  Recent Labs Lab 12/07/16 2248 12/08/16 0314 12/08/16 1036 12/09/16 0243 12/10/16 0319 12/11/16 0318  NA 137  --  140 141 139 138  K 4.0  --  3.9 3.4* 3.8 4.0  CL 104  --  107 101 101 100*  CO2 19*  --  _0 33*  GLUCOSE 102*  --  174* 92 77 97  BUN 30*  --  26* 22* 18 18  CREATININE 1.47*  --  1.36* 1.24 1.06 1.13  CALCIUM 8.9  --  9.3 8.8* 9.0 9.1  MG  --  2.3  --   --  2.1  --   PHOS  --  3.2  --   --   --   --    Liver Function Tests:  Recent Labs Lab 12/07/16 2248 12/08/16 1036  AST 76* 74*  ALT 50 49  ALKPHOS 74 70  BILITOT 1.9* 1.8*  PROT 6.1* 6.1*  ALBUMIN 3.1* 3.0*    CBC:  Recent Labs Lab 12/07/16 2248 12/08/16 0314 12/08/16 1036 12/09/16 0243 12/10/16 0319 12/11/16 0318  WBC 12.0* 12.7* 12.2* 11.6* 10.5 10.7*  NEUTROABS 9.4*  --  10.1*  --   --   --   HGB 11.7* 11.3* 12.0* 11.5* 11.7* 11.9*  HCT 35.6* 35.5* 37.1* 35.8* 37.4* 38.3*  MCV 86.4 86.6 86.5 86.9 88.2 88.2  PLT 258 254 246 265 254 277   Cardiac Enzymes:  Recent Labs Lab 12/07/16 2248 12/08/16 0406 12/08/16 1036  TROPONINI 0.07* 0.07* 0.06*   BNP: BNP (last 3 results)  Recent Labs  12/08/16 0406  BNP 1,131.1*    IMAGING STUDIES Ct Angio Chest Pe W And/or Wo Contrast  Result Date: 12/08/2016 CLINICAL DATA:  71 year old male with increasing shortness of breath and cough. Concern for PE. EXAM: CT ANGIOGRAPHY CHEST WITH CONTRAST TECHNIQUE: Multidetector CT imaging of the chest was performed using the standard protocol during bolus administration of intravenous contrast. Multiplanar CT image reconstructions and MIPs were obtained to evaluate the vascular anatomy. CONTRAST:  80 cc Isovue 370 COMPARISON:  Chest radiograph dated  12/07/2016 FINDINGS: Cardiovascular: There is moderate cardiomegaly with enlargement of the right cardiac chambers.  There is retrograde flow of contrast from the right atrium into the IVC compatible with a degree of right cardiac dysfunction. Correlation with echocardiogram recommended. Calcified mitral annulus noted. No significant pericardial effusion. There is mild atherosclerotic calcification of the thoracic aorta. There is dilatation of the main pulmonary trunk suggestive of underlying pulmonary hypertension. Evaluation of the pulmonary arteries is limited due to suboptimal opacification of the distal branches. Areas of apparent decreased enhancement in the periphery of the distal branches of the lower lobe pulmonary arteries likely related to suboptimal enhancement and less likely may represent scarring or nonocclusive emboli. A nonopacified linear  structure in the left lower lobe likely represents scarring within the vessel or in the lung parenchyma. No convincing or occlusive acute pulmonary embolus identified. Mediastinum/Nodes: There is no hilar adenopathy. Mildly enlarged right paratracheal lymph node measures 15 mm in short axis. Multiple small vascular collaterals noted in the posterior mediastinum. Esophagus and the thyroid gland are grossly unremarkable. Lungs/Pleura: There is severe emphysematous changes of the lungs with bibasilar pleuroparenchymal scarring and traction bronchiectasis. Triangular pleural based density along the right major fissure likely chronic and represents scarring. Patchy area of ground-glass density in the right upper lobe anteriorly may be chronic. Acute infiltrate is less likely but not entirely excluded. Clinical correlation is recommended there is no pleural effusion or pneumothorax. The central airways are patent. Upper Abdomen: Diffuse stranding of the upper abdominal mesentery. Musculoskeletal: Degenerative changes of the spine. No acute fracture. Review of the MIP images confirms the above findings. IMPRESSION: 1. No definite CT evidence of acute or occlusive PE. Suboptimal opacification of the peripheral branches limits evaluation. 2. Emphysema with pleuroparenchymal scarring and traction bronchiectasis of the lower lobes. Patchy area of ground-glass density in the right upper lobe, likely chronic and less likely an acute pneumonia. Clinical correlation is recommended. 3. Moderate cardiomegaly with evidence of right cardiac dysfunction. Correlation with echocardiogram recommended. 4. Dilated pulmonary arteries suggestive of pulmonary hypertension. Electronically Signed   By: Anner Crete M.D.   On: 12/08/2016 01:13   Dg Chest Port 1 View  Result Date: 12/07/2016 CLINICAL DATA:  Acute onset of shortness of breath and dyspnea. Productive cough. Initial encounter. EXAM: PORTABLE CHEST 1 VIEW COMPARISON:  Chest  radiograph performed 11/19/2015 FINDINGS: The lungs are well-aerated. Chronic fibrotic changes are again noted, slightly less well characterized due to motion artifact. There may be superimposed right midlung airspace opacity. There is no evidence of pleural effusion or pneumothorax. The cardiomediastinal silhouette is mildly enlarged. No acute osseous abnormalities are seen. IMPRESSION: 1. Question of superimposed acute right midlung airspace opacity, which could reflect pneumonia. 2. Underlying chronic bilateral fibrotic changes again noted. 3. Mild cardiomegaly. Electronically Signed   By: Garald Balding M.D.   On: 12/07/2016 23:01    DISCHARGE EXAMINATION: Vitals:   12/11/16 1142 12/11/16 1500 12/11/16 2052 12/12/16 0531  BP:  102/64 104/71 116/67  Pulse:  73 71 60  Resp:  18 (!) 24 20  Temp:   97.7 F (36.5 C) 98.5 F (36.9 C)  TempSrc:   Oral Oral  SpO2: 95% 96% 95% 98%  Weight:      Height:       General appearance: alert, cooperative, appears stated age and no distress Resp: Few crackles in the bases as previously noted, likely due to his fibrosis. Otherwise clear to auscultation. Cardio: regular rate and rhythm, S1, S2 normal, no murmur, click, rub or gallop GI: soft, non-tender; bowel sounds normal; no masses,  no  organomegaly Extremities: extremities normal, atraumatic, no cyanosis or edema  DISPOSITION: Home  Discharge Instructions    Call MD for:  difficulty breathing, headache or visual disturbances    Complete by:  As directed    Call MD for:  extreme fatigue    Complete by:  As directed    Call MD for:  persistant dizziness or light-headedness    Complete by:  As directed    Call MD for:  persistant nausea and vomiting    Complete by:  As directed    Call MD for:  severe uncontrolled pain    Complete by:  As directed    Call MD for:  temperature >100.4    Complete by:  As directed    Discharge instructions    Complete by:  As directed    Please follow-up with  your primary care provider within one week. You will need to have blood work done to check your potassium level as well as your kidney function. Please note change to the dose of your terazocin. If you continue to have prostate trouble with this lower dose, you should discuss with the primary care physician regarding trying an alternative medication such as Flomax. Continue with pulmonary rehabilitation. Use oxygen as before.  You were cared for by a hospitalist during your hospital stay. If you have any questions about your discharge medications or the care you received while you were in the hospital after you are discharged, you can call the unit and asked to speak with the hospitalist on call if the hospitalist that took care of you is not available. Once you are discharged, your primary care physician will handle any further medical issues. Please note that NO REFILLS for any discharge medications will be authorized once you are discharged, as it is imperative that you return to your primary care physician (or establish a relationship with a primary care physician if you do not have one) for your aftercare needs so that they can reassess your need for medications and monitor your lab values. If you do not have a primary care physician, you can call [phone] for a physician referral.   Increase activity slowly    Complete by:  As directed       ALLERGIES:  Allergies  Allergen Reactions  . Lovastatin Rash     Discharge Medication List as of 12/12/2016 11:30 AM    START taking these medications   Details  potassium chloride SA (K-DUR,KLOR-CON) 20 MEQ tablet Take 1 tablet (20 mEq total) by mouth daily., Starting Sat 12/12/2016, Print      CONTINUE these medications which have CHANGED   Details  furosemide (LASIX) 20 MG tablet Take 2 tablets (40 mg total) by mouth daily., Starting Sat 12/12/2016, Print    terazosin (HYTRIN) 1 MG capsule Take 2 capsules (2 mg total) by mouth at bedtime.,  Starting Sat 12/12/2016, Print      CONTINUE these medications which have NOT CHANGED   Details  alendronate (FOSAMAX) 70 MG tablet Take 70 mg by mouth once a week. Take with a full glass of water on an empty stomach., Historical Med    aspirin 81 MG tablet Take 81 mg by mouth daily., Historical Med    atorvastatin (LIPITOR) 20 MG tablet Take 20 mg by mouth daily., Historical Med    Calcium Carbonate-Vit D-Min (CALCIUM 600+D PLUS MINERALS PO) Take 1 tablet by mouth 2 (two) times daily., Historical Med    chlorpheniramine (CHLOR-TRIMETON) 4 MG tablet  Take 4 mg by mouth 2 (two) times daily as needed for allergies., Historical Med    famotidine (PEPCID) 20 MG tablet TAKE 1 TABLET BY MOUTH AT BEDTIME, Normal    loratadine (CLARITIN) 10 MG tablet Take 10 mg by mouth daily., Historical Med    OXYGEN 2 lpm with sleep and as needed with exertion, Historical Med    pantoprazole (PROTONIX) 40 MG tablet TAKE 1 TABLET BY MOUTH EVERY DAY *TAKE 30-60 MINUTES BEFORE FIRST MEAL OF THE DAY*, Normal    predniSONE (DELTASONE) 10 MG tablet TAKE 2 DAILY UNTIL BETTER THEN ONE DAILY UNTIL RETURN, Normal    Pseudoephedrine-Guaifenesin (MUCINEX D MAX STRENGTH) [phone] MG TB12 Take 1 tablet by mouth 2 (two) times daily. , Historical Med         Follow-up Information    Christinia Gully, MD Follow up on 01/01/2017.   Specialty:  Pulmonary Disease Why:  3pm  Contact information: [address] [address] [phone]        Leonard Downing, MD. Schedule an appointment as soon as possible for a visit in 1 week(s).   Specialty:  Family Medicine Contact information: [place] [address] [phone]           TOTAL DISCHARGE TIME: 35 minutes  [facility] Pager [phone]  12/12/2016, 12:40 PM

## 2016-12-12 NOTE — Progress Notes (Signed)
12/12/2016 11:53 AM Discharge AVS meds taken today and those due this evening reviewed.  Follow-up appointments and when to call md reviewed.  D/C IV and TELE.  Questions and concerns addressed.   D/C home per orders. [place]

## 2016-12-12 NOTE — Discharge Instructions (Signed)
Heart Failure  Heart failure means your heart has trouble pumping blood. This makes it hard for your body to work well. Heart failure is usually a long-term (chronic) condition. You must take good care of yourself and follow your doctor's treatment plan.  HOME CARE   Take your heart medicine as told by your doctor.    Do not stop taking medicine unless your doctor tells you to.    Do not skip any dose of medicine.    Refill your medicines before they run out.    Take other medicines only as told by your doctor or pharmacist.   Stay active if told by your doctor. The elderly and people with severe heart failure should talk with a doctor about physical activity.   Eat heart-healthy foods. Choose foods that are without trans fat and are low in saturated fat, cholesterol, and salt (sodium). This includes fresh or frozen fruits and vegetables, fish, lean meats, fat-free or low-fat dairy foods, whole grains, and high-fiber foods. Lentils and dried peas and beans (legumes) are also good choices.   Limit salt if told by your doctor.   Cook in a healthy way. Roast, grill, broil, bake, poach, steam, or stir-fry foods.   Limit fluids as told by your doctor.   Weigh yourself every morning. Do this after you pee (urinate) and before you eat breakfast. Write down your weight to give to your doctor.   Take your blood pressure and write it down if your doctor tells you to.   Ask your doctor how to check your pulse. Check your pulse as told.   Lose weight if told by your doctor.   Stop smoking or chewing tobacco. Do not use gum or patches that help you quit without your doctor's approval.   Schedule and go to doctor visits as told.   Nonpregnant women should have no more than 1 drink a day. Men should have no more than 2 drinks a day. Talk to your doctor about drinking alcohol.   Stop illegal drug use.   Stay current with shots (immunizations).   Manage your health conditions as told by your doctor.   Learn to  manage your stress.   Rest when you are tired.   If it is really hot outside:    Avoid intense activities.    Use air conditioning or fans, or get in a cooler place.    Avoid caffeine and alcohol.    Wear loose-fitting, lightweight, and light-colored clothing.   If it is really cold outside:    Avoid intense activities.    Layer your clothing.    Wear mittens or gloves, a hat, and a scarf when going outside.    Avoid alcohol.   Learn about heart failure and get support as needed.   Get help to maintain or improve your quality of life and your ability to care for yourself as needed.  GET HELP IF:    You gain weight quickly.   You are more short of breath than usual.   You cannot do your normal activities.   You tire easily.   You cough more than normal, especially with activity.   You have any or more puffiness (swelling) in areas such as your hands, feet, ankles, or belly (abdomen).   You cannot sleep because it is hard to breathe.   You feel like your heart is beating fast (palpitations).   You get dizzy or light-headed when you stand up.  GET HELP   RIGHT AWAY IF:    You have trouble breathing.   There is a change in mental status, such as becoming less alert or not being able to focus.   You have chest pain or discomfort.   You faint.  MAKE SURE YOU:    Understand these instructions.   Will watch your condition.   Will get help right away if you are not doing well or get worse.     This information is not intended to replace advice given to you by your health care provider. Make sure you discuss any questions you have with your health care provider.     Document Released: 07/14/2008 Document Revised: 10/26/2014 Document Reviewed: 11/21/2012  [employer]vier Interactive Patient Education 2017 [employer]

## 2016-12-15 ENCOUNTER — Encounter (HOSPITAL_COMMUNITY): Payer: Medicare Other

## 2016-12-17 ENCOUNTER — Encounter (HOSPITAL_COMMUNITY)
Admission: RE | Admit: 2016-12-17 | Discharge: 2016-12-17 | Disposition: A | Discharge status: Home / Self Care | Payer: Medicare Other | Source: Ambulatory Visit | Attending: Internal Medicine | Admitting: Internal Medicine

## 2016-12-17 ENCOUNTER — Telehealth: Payer: Self-pay | Admitting: Internal Medicine

## 2016-12-17 VITALS — Wt 134.3 lb

## 2016-12-17 DIAGNOSIS — J8410 Pulmonary fibrosis, unspecified: Secondary | ICD-10-CM | POA: Diagnosis not present

## 2016-12-17 DIAGNOSIS — J9611 Chronic respiratory failure with hypoxia: Secondary | ICD-10-CM | POA: Diagnosis not present

## 2016-12-17 DIAGNOSIS — R0600 Dyspnea, unspecified: Secondary | ICD-10-CM | POA: Diagnosis not present

## 2016-12-17 NOTE — Telephone Encounter (Signed)
pulm rehab closed- after 5:00.  Will call back 12/18/16

## 2016-12-17 NOTE — Telephone Encounter (Signed)
Spoke with Cloyde Reams with pulm rehab  Pt returned to rehab today after not attending rehab for 2 wks  He was doing seated exercise today and required 15 lpm to maintain sats above 88%  He was only up to 90% on the 15 lpm and this is with forehead probe not finger probe  She wants to let MW know about this, and also pt will need order for tank that will accommodate increased liter flow  Note that the pt is coming in for HFU on 01/01/17  Please advise thanks

## 2016-12-17 NOTE — Progress Notes (Signed)
Daily Session Note  Patient Details  Name: Gilbert Dominguez MRN: [id number] Date of Birth: [birth year] Referring Provider:   April Manson Pulmonary Rehab Walk Test from 08/11/2016 in [place]  Referring Provider  Dr. Melvyn Novas      Encounter Date: 12/17/2016  Check In:     Session Check In - 12/17/16 1337      Check-In   Location [facility]   Staff Present Su Hilt, MS, ACSM RCEP, Exercise Physiologist;Portia Rollene Rotunda, RN, Maxcine Ham, RN, Roque Cash, RN   Supervising physician immediately available to respond to emergencies [facility] immediately available   Physician(s) Dr. Laurena Bering   Medication changes reported     No   Fall or balance concerns reported    No   Tobacco Cessation No Change   Warm-up and Cool-down Performed as group-led instruction   Resistance Training Performed Yes   VAD Patient? No     Pain Assessment   Currently in Pain? No/denies   Multiple Pain Sites No      Capillary Blood Glucose: No results found for this or any previous visit (from the past 24 hour(s)).      Exercise Prescription Changes - 12/17/16 1500      Response to Exercise   Blood Pressure (Admit) 106/70   Blood Pressure (Exercise) 104/62   Blood Pressure (Exit) 100/60   Heart Rate (Admit) 83 bpm   Heart Rate (Exercise) 95 bpm   Heart Rate (Exit) 69 bpm   Oxygen Saturation (Admit) 92 %   Oxygen Saturation (Exercise) 90 %   Oxygen Saturation (Exit) 100 %   Rating of Perceived Exertion (Exercise) 13   Perceived Dyspnea (Exercise) 3   Duration Progress to 45 minutes of aerobic exercise without signs/symptoms of physical distress   Intensity THRR unchanged     Progression   Progression Continue to progress workloads to maintain intensity without signs/symptoms of physical distress.     Resistance Training   Training Prescription Yes   Weight green bands   Reps 10-15   Time 10 Minutes     Interval Training    Interval Training No     Oxygen   Oxygen Continuous   Liters 15     Recumbant Bike   Level 5   Minutes 17     NuStep   Level 3  decreased level since hospitalized last week   Minutes 17   METs 2      History  Smoking Status  . Former Smoker  . Packs/day: 1.00  . Years: 36.00  . Types: Cigarettes  . Quit date: 10/14/1995  Smokeless Tobacco  . Never Used    Goals Met:  Exercise tolerated well Strength training completed today  Goals Unmet:  O2 Sat  Comments: Service time is from 1330 to 1520    Dr. Rush Farmer is Medical Director for Pulmonary Rehab at [facility].

## 2016-12-17 NOTE — Telephone Encounter (Signed)
Will not need anything extra at home pending f/u ov here but ok to titrate up at rehab

## 2016-12-17 NOTE — Progress Notes (Signed)
Gilbert Dominguez returned to pulmonary rehab today after a hospitalization last week.  He was hospitalized with chronic respiratory failure, fluid overload, and postinflammatory pulmonary fibrosis.  He is now requiring 15 liters of oxygen while exercising to keep his saturations above 88%.  He was 82-84% on 8L, 84-85% on 10 L, 88-91% on 15 L.  He needs oxygen equipment to meet his new requirements.  His home concentrator only goes to 5L, his regulator for his oxygen tank only goes to 8 liters, [facility] is his DME.  I called Dr.Wert's office to ask for these supplies to be requested from [facility].

## 2016-12-18 NOTE — Telephone Encounter (Signed)
Spoke with front desk staff who states Remo Lipps was not in the office and Cloyde Reams was being a class. Gave her Dr. Gustavus Bryant message to give to [facility]. She stated she will relay the message and if Cloyde Reams has any further questions then she will follow up with [place]. Nothing further is needed at this time.

## 2016-12-24 ENCOUNTER — Encounter (HOSPITAL_COMMUNITY)
Admission: RE | Admit: 2016-12-24 | Discharge: 2016-12-24 | Disposition: A | Discharge status: Home / Self Care | Payer: Medicare Other | Source: Ambulatory Visit

## 2016-12-29 ENCOUNTER — Encounter (HOSPITAL_COMMUNITY)
Admission: RE | Admit: 2016-12-29 | Discharge: 2016-12-29 | Disposition: A | Discharge status: Home / Self Care | Payer: Medicare Other | Source: Ambulatory Visit

## 2016-12-29 ENCOUNTER — Telehealth (HOSPITAL_COMMUNITY): Payer: Self-pay | Admitting: Family Medicine

## 2016-12-29 DIAGNOSIS — J8410 Pulmonary fibrosis, unspecified: Secondary | ICD-10-CM

## 2016-12-29 NOTE — Progress Notes (Signed)
Pulmonary Individual Treatment Plan  Patient Details  Name: Gilbert Dominguez MRN: [id number] Date of Birth: 03/04/1946 Referring Provider:   April Manson Pulmonary Rehab Walk Test from 08/11/2016 in [place]  Referring Provider  Dr. Melvyn Novas      Initial Encounter Date:  Flowsheet Row Pulmonary Rehab Walk Test from 08/11/2016 in [place]  Date  08/11/16  Referring Provider  Dr. 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  alendronate (FOSAMAX) 70 MG tablet, Take 70 mg by mouth once a week. Take with a full glass of water on an empty stomach., Disp: , Rfl:  .  aspirin 81 MG tablet, Take 81 mg by mouth daily., Disp: , Rfl:  .  atorvastatin (LIPITOR) 20 MG tablet, Take 20 mg by mouth daily., Disp: , Rfl:  .  Calcium Carbonate-Vit D-Min (CALCIUM 600+D PLUS MINERALS PO), Take 1 tablet by mouth 2 (two) times daily., Disp: , Rfl:  .  chlorpheniramine (CHLOR-TRIMETON) 4 MG tablet, Take 4 mg by mouth 2 (two) times daily as needed for allergies., Disp: , Rfl:  .  famotidine (PEPCID) 20 MG tablet, TAKE 1 TABLET BY MOUTH AT BEDTIME, Disp: 30 tablet, Rfl: 2 .  furosemide (LASIX) 20 MG tablet, Take 2 tablets (40 mg total) by mouth daily., Disp: 60 tablet, Rfl: 0 .  loratadine (CLARITIN) 10 MG tablet, Take 10 mg by mouth daily., Disp: , Rfl:  .  OXYGEN, 2 lpm with sleep and as needed with exertion, Disp: , Rfl:  .  pantoprazole (PROTONIX) 40 MG tablet, TAKE 1 TABLET BY MOUTH EVERY DAY *TAKE 30-60 MINUTES BEFORE FIRST MEAL OF THE DAY*, Disp: 30 tablet, Rfl: 11 .  potassium chloride SA (K-DUR,KLOR-CON) 20 MEQ tablet, Take 1 tablet (20 mEq total) by mouth daily., Disp: 30 tablet, Rfl: 0 .  predniSONE (DELTASONE) 10 MG tablet, TAKE 2 DAILY UNTIL BETTER THEN ONE DAILY UNTIL RETURN, Disp: 60 tablet, Rfl: 2 .   Pseudoephedrine-Guaifenesin (MUCINEX D MAX STRENGTH) [phone] MG TB12, Take 1 tablet by mouth 2 (two) times daily. , Disp: , Rfl:  .  terazosin (HYTRIN) 1 MG capsule, Take 2 capsules (2 mg total) by mouth at bedtime., Disp: 60 capsule, Rfl: 0  Past Medical History: Past Medical History:  Diagnosis Date  . Chronic respiratory failure ([place])   . Hypertension   . 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 Rheumatoid arthritis(714.0)     Tobacco Use: History  Smoking Status  . Former Smoker  . Packs/day: 1.00  . Years: 36.00  . Types: Cigarettes  . Quit date: 10/14/1995  Smokeless Tobacco  . Never Used    Labs: Recent Review Flowsheet Data    There is no flowsheet data to display.      Capillary Blood Glucose: No results found for: GLUCAP   ADL UCSD:   Pulmonary Function Assessment:     Pulmonary Function Assessment - 08/07/16 1101      Breath   Bilateral Breath Sounds Other   Other course crackles in bases bilat   Shortness of Breath Yes;Limiting activity      Exercise Target Goals:    Exercise Program Goal: Individual exercise prescription set with THRR, safety & activity barriers. Participant demonstrates ability to understand and report RPE using BORG scale, to self-measure pulse accurately, and to acknowledge the importance of the exercise prescription.  Exercise Prescription  Goal: Starting with aerobic activity 30 plus minutes a day, 3 days per week for initial exercise prescription. Provide home exercise prescription and guidelines that participant acknowledges understanding prior to discharge.  Activity Barriers & Risk Stratification:     Activity Barriers & Cardiac Risk Stratification - 08/07/16 1100      Activity Barriers & Cardiac Risk Stratification   Activity Barriers Shortness of Breath;Deconditioning      6 Minute Walk:     6 Minute Walk    Row Name 08/11/16 1625         6 Minute Walk   Phase Initial     Distance 600 feet      Walk Time -  4 minutes and 20 seconds total     # of Rest Breaks 2  first rest break 1 minute and 30 seconds --second rest break lasted 10 seconds     RPE 12     Perceived Dyspnea  2     Symptoms Yes (comment)     Comments dizzy when oxygen saturation dropped     Resting HR 82 bpm     Resting BP 95/60     Max Ex. HR 97 bpm     Max Ex. BP 104/63  BP 83/49 at 6 minutes     2 Minute Post BP 91/58       Interval HR   Baseline HR 82     1 Minute HR 89     2 Minute HR 92     3 Minute HR 90     4 Minute HR 94     5 Minute HR 95     6 Minute HR 97     2 Minute Post HR 93     Interval Heart Rate? Yes       Interval Oxygen   Interval Oxygen? Yes     Baseline Oxygen Saturation % 94 %     Baseline Liters of Oxygen 2 L     1 Minute Oxygen Saturation % 85 %     1 Minute Liters of Oxygen 2 L     2 Minute Oxygen Saturation % 80 %  stopped     2 Minute Liters of Oxygen 4 L     3 Minute Oxygen Saturation % 84 %     3 Minute Liters of Oxygen 6 L     4 Minute Oxygen Saturation % 88 %     4 Minute Liters of Oxygen 6 L     5 Minute Oxygen Saturation % 90 %     5 Minute Liters of Oxygen 6 L     6 Minute Oxygen Saturation % 88 %     6 Minute Liters of Oxygen 6 L     2 Minute Post Oxygen Saturation % 88 %     2 Minute Post Liters of Oxygen 6 L        Oxygen Initial Assessment:   Oxygen Re-Evaluation:   Oxygen Discharge (Final Oxygen Re-Evaluation):   Initial Exercise Prescription:     Initial Exercise Prescription - 08/11/16 1600      Date of Initial Exercise RX and Referring Provider   Date 08/11/16   Referring Provider Dr. Melvyn Novas     Oxygen   Oxygen Continuous   Liters 6     Recumbant Bike   Level 2   Minutes 17     NuStep   Level 2   Minutes 17   METs  1.5     Track   Laps 5   Minutes 17     Prescription Details   Frequency (times per week) 2   Duration Progress to 45 minutes of aerobic exercise without signs/symptoms of physical distress     Intensity    THRR 40-80% of Max Heartrate 59-118   Ratings of Perceived Exertion 11-13   Perceived Dyspnea 0-4     Progression   Progression Continue progressive overload as per policy without signs/symptoms or physical distress.     Resistance Training   Training Prescription Yes   Weight green bands   Reps 10-12      Perform Capillary Blood Glucose checks as needed.  Exercise Prescription Changes:     Exercise Prescription Changes    Row Name 08/20/16 1600 08/25/16 1500 08/27/16 1600 09/01/16 1500 09/03/16 1500     Response to Exercise   Blood Pressure (Admit) 114/50 110/46 90/40 100/50 106/60   Blood Pressure (Exercise) 120/60 104/60 1_0   Blood Pressure (Exit) 106/64 106/64 104/62 100/60 98/64   Heart Rate (Admit) 73 bpm 81 bpm 84 bpm 89 bpm 70 bpm   Heart Rate (Exercise) 79 bpm 103 bpm 102 bpm 97 bpm 72 bpm   Heart Rate (Exit) 63 bpm 92 bpm 70 bpm 75 bpm 61 bpm   Oxygen Saturation (Admit) 89 % 92 % 91 % 98 % 98 %   Oxygen Saturation (Exercise) 86 %  O2 increased to 8L with exercise sat improved to 93. 91 % 90 % 90 % 94 %   Oxygen Saturation (Exit) 98 % 100 % 100 % 95 % 94 %   Rating of Perceived Exertion (Exercise) _1 Perceived Dyspnea (Exercise) _2 Duration Progress to 45 minutes of aerobic exercise without signs/symptoms of physical distress Progress to 45 minutes of aerobic exercise without signs/symptoms of physical distress Progress to 45 minutes of aerobic exercise without signs/symptoms of physical distress Progress to 45 minutes of aerobic exercise without signs/symptoms of physical distress Progress to 45 minutes of aerobic exercise without signs/symptoms of physical distress   Intensity Other (comment)  40-80% of HRR THRR unchanged THRR unchanged THRR unchanged THRR unchanged     Progression   Progression Continue to progress workloads to maintain intensity without signs/symptoms of physical distress. Continue to progress workloads to  maintain intensity without signs/symptoms of physical distress. Continue to progress workloads to maintain intensity without signs/symptoms of physical distress. Continue to progress workloads to maintain intensity without signs/symptoms of physical distress. Continue to progress workloads to maintain intensity without signs/symptoms of physical distress.     Resistance Training   Training Prescription _3    Weight _4    Reps 10-12  10 minutes of strength training 10-12  10 minutes of strength training 10-12  10 minutes of strength training 10-12  10 minutes of strength training 10-12  10 minutes of strength training     Interval Training   Interval Training _5      Oxygen   Oxygen _6    Liters _7 Recumbant Bike   Level  - 2  - 3 3   Minutes  - 17  - 17 17     NuStep   Level 2 3  - 4 4   Minutes 17  17  - 17 17   METs 2.3  -  - 2.3 1.8     Track   Laps _0 -   Minutes 17 17 34 17  -     Exercise Review   Progression  - Yes  - Yes  -   Row Name 09/08/16 1552 09/15/16 1600 09/17/16 1600 09/22/16 1500 09/24/16 1548     Response to Exercise   Blood Pressure (Admit) 98/50 96/50 1_1   Blood Pressure (Exercise) 99/69 100/50 100/68 94/50 109/70   Blood Pressure (Exit) 100/60 1_2 92/62   Heart Rate (Admit) 85 bpm 93 bpm 72 bpm 80 bpm 76 bpm   Heart Rate (Exercise) 91 bpm 97 bpm 90 bpm 90 bpm 82 bpm   Heart Rate (Exit) 77 bpm 73 bpm 69 bpm 89 bpm 88 bpm   Oxygen Saturation (Admit) 91 % 92 % 99 % 89 % 92 %   Oxygen Saturation (Exercise) 88 % 91 % 87 % 90 % 92 %   Oxygen Saturation (Exit) 100 % 99 % 96 % 100 % 95 %   Rating of Perceived Exertion (Exercise) _3 Perceived Dyspnea (Exercise) _4 Duration Progress to 45 minutes of aerobic exercise without signs/symptoms of physical  distress Progress to 45 minutes of aerobic exercise without signs/symptoms of physical distress Progress to 45 minutes of aerobic exercise without signs/symptoms of physical distress Progress to 45 minutes of aerobic exercise without signs/symptoms of physical distress Progress to 45 minutes of aerobic exercise without signs/symptoms of physical distress   Intensity _5      Progression   Progression Continue to progress workloads to maintain intensity without signs/symptoms of physical distress. Continue to progress workloads to maintain intensity without signs/symptoms of physical distress. Continue to progress workloads to maintain intensity without signs/symptoms of physical distress. Continue to progress workloads to maintain intensity without signs/symptoms of physical distress. Continue to progress workloads to maintain intensity without signs/symptoms of physical distress.     Resistance Training   Training Prescription _6    Weight _7    Reps 10-12  10 minutes of strength training 10-12  10 minutes of strength training 10-12  10 minutes of strength training 10-12  10 minutes of strength training 10-12  10 minutes of strength training     Interval Training   Interval Training _8      Oxygen   Oxygen _9    Liters _10 Recumbant Bike   Level 4 4  - 4  -   Minutes 17 17  - 17  -     NuStep   Level _11 Minutes _12 METs 2.2 2.4  - 2.4 2.6     Track   Laps _13 Minutes _14 Exercise Review   Progression Yes  -  -  -  -   Row Name 10/06/16 1600 10/08/16 1605 10/20/16 1500 10/29/16 1500 11/03/16 1500     Response to Exercise   Blood Pressure (Admit) _15 104/60 92/50   Blood Pressure (Exercise) 96/50 102/50  94/52  100/70 136/70   Blood Pressure (Exit) 98/58 96/64 106/60 112/60 94/50   Heart Rate (Admit) 76 bpm 75 bpm 91 bpm 90 bpm 82 bpm   Heart Rate (Exercise) 106 bpm 97 bpm 102 bpm 106 bpm 98 bpm   Heart Rate (Exit) 64 bpm 70 bpm 72 bpm 68 bpm 73 bpm   Oxygen Saturation (Admit) 93 % 97 % 90 % 93 % 99 %   Oxygen Saturation (Exercise) 87 % 84 %  increased 90 86 % 86 %  slowly improved to 89 with rest, continue to assess 92 %   Oxygen Saturation (Exit) 94 % 91 % 98 % 96 % 100 %   Rating of Perceived Exertion (Exercise) _0 Perceived Dyspnea (Exercise) _1 Duration Progress to 45 minutes of aerobic exercise without signs/symptoms of physical distress Progress to 45 minutes of aerobic exercise without signs/symptoms of physical distress Progress to 45 minutes of aerobic exercise without signs/symptoms of physical distress Progress to 45 minutes of aerobic exercise without signs/symptoms of physical distress Progress to 45 minutes of aerobic exercise without signs/symptoms of physical distress   Intensity _2      Progression   Progression Continue to progress workloads to maintain intensity without signs/symptoms of physical distress. Continue to progress workloads to maintain intensity without signs/symptoms of physical distress. Continue to progress workloads to maintain intensity without signs/symptoms of physical distress. Continue to progress workloads to maintain intensity without signs/symptoms of physical distress. Continue to progress workloads to maintain intensity without signs/symptoms of physical distress.     Resistance Training   Training Prescription _3    Weight _4    Reps 10-12  10 minutes of strength training 10-12  10 minutes of strength training 10-12  10 minutes of strength training 10-12  10 minutes of strength  training 10-12  10 minutes of strength training     Interval Training   Interval Training _5      Oxygen   Oxygen _6    Liters _7 Recumbant Bike   Level 4 4.5 5._8 Minutes _9 NuStep   Level _10 Minutes _11 METs 2.6 2.9 2.2 2.2 2.1     Track   Laps 7  - 8  - 3   Minutes 17  - 17  - 17     Exercise Review   Progression Yes  - Yes  -  -   Row Name 11/10/16 1500 11/12/16 1532 11/17/16 1519 11/19/16 1604 11/24/16 1500     Response to Exercise   Blood Pressure (Admit) 94/60 96/61 100/52 100/50 98/62   Blood Pressure (Exercise) 100/70 96/60 121/74 103/65 100/62   Blood Pressure (Exit) _12 98/60 92/52   Heart Rate (Admit) 84 bpm 75 bpm 78 bpm 80 bpm 78 bpm   Heart Rate (Exercise) 107 bpm 97 bpm 97 bpm 105 bpm 98 bpm   Heart Rate (Exit) 66 bpm 68 bpm 67 bpm 68 bpm 70 bpm   Oxygen Saturation (Admit) 94 % 91 % 100 % 93 % 98 %   Oxygen Saturation (Exercise) 84 %  with rest increased to  88% later sat 86 O2 increased to 10L 87 % 85 % 88 % 91 %   Oxygen Saturation (Exit) 100 % 100 % 100 % 100 % 100 %   Rating of Perceived Exertion (Exercise) _0 Perceived Dyspnea (Exercise) _1 Duration Progress to 45 minutes of aerobic exercise without signs/symptoms of physical distress Progress to 45 minutes of aerobic exercise without signs/symptoms of physical distress Progress to 45 minutes of aerobic exercise without signs/symptoms of physical distress Progress to 45 minutes of aerobic exercise without signs/symptoms of physical distress Progress to 45 minutes of aerobic exercise without signs/symptoms of physical distress   Intensity _2      Progression   Progression Continue to progress workloads to maintain intensity without signs/symptoms of physical distress. Continue to  progress workloads to maintain intensity without signs/symptoms of physical distress. Continue to progress workloads to maintain intensity without signs/symptoms of physical distress. Continue to progress workloads to maintain intensity without signs/symptoms of physical distress. Continue to progress workloads to maintain intensity without signs/symptoms of physical distress.     Resistance Training   Training Prescription _3    Weight _4    Reps 10-12  10 minutes of strength training 10-12  10 minutes of strength training 10-12  10 minutes of strength training 10-12  10 minutes of strength training 10-12  10 minutes of strength training     Interval Training   Interval Training _5      Oxygen   Oxygen _6    Liters 8-_7 Recumbant Bike   Level _8 Minutes _9 NuStep   Level 5  - 4  - 5   Minutes 17  - 17  - 17   METs 2.2  - 2.2  - 2     Track   Laps _10 Minutes _11 Row Name 11/26/16 1539 12/17/16 1500           Response to Exercise   Blood Pressure (Admit) 104/60 106/70      Blood Pressure (Exercise) 112/60 104/62      Blood Pressure (Exit) 116/78 100/60      Heart Rate (Admit) 77 bpm 83 bpm      Heart Rate (Exercise) 95 bpm 95 bpm      Heart Rate (Exit) 67 bpm 69 bpm      Oxygen Saturation (Admit) 93 % 92 %      Oxygen Saturation (Exercise) 86 % 90 %      Oxygen Saturation (Exit) 100 % 100 %      Rating of Perceived Exertion (Exercise) 13 13      Perceived Dyspnea (Exercise) 2 3      Duration Progress to 45 minutes of aerobic exercise without signs/symptoms of physical distress Progress to 45 minutes of aerobic exercise without signs/symptoms of physical distress      Intensity THRR unchanged THRR unchanged        Progression   Progression Continue to progress workloads to  maintain intensity without signs/symptoms of physical distress. Continue to progress workloads to maintain intensity without signs/symptoms of physical distress.  Resistance Training   Training Prescription Yes Yes      Weight green bands green bands      Reps 10-12  10 minutes of strength training 10-15      Time  - 10 Minutes        Interval Training   Interval Training No No        Oxygen   Oxygen Continuous Continuous      Liters 8 15        Recumbant Bike   Level 5 5      Minutes 17 17        NuStep   Level  - 3  decreased level since hospitalized last week      Minutes  - 17      METs  - 2        Track   Laps 5  -      Minutes 17  -         Exercise Comments:     Exercise Comments    Row Name 08/24/16 1025 09/21/16 1645 10/08/16 1003 11/10/16 0829 11/30/16 1613   Exercise Comments Patient has only attended one session. Will cont. to monitor. Patient is doing well in PR. Open to workload increases. Works hard when he is here. Oxygen saturation is monitored closely. Will cont. to monitor and progress. Patient is doing well in PR. Open to workload increases. Works hard when he is here. Oxygen saturation is monitored closely. Will cont. to monitor and progress. Patient is slowly progressing in rehab. MET levels place him at a low level. Will cont. to monitor and progress.  Patient is slowly progressing in rehab. MET levels place him at a low level. Keeping oxygen saturations up has been a problem--now on 10 liters with most exercise. Will cont. to monitor and progress.       Exercise Goals and Review:   Exercise Goals Re-Evaluation :     Exercise Goals Re-Evaluation    Row Name 12/28/16 1159             Exercise Goal Re-Evaluation   Exercise Goals Review Increase Physical Activity;Increase Strenth and Stamina       Comments Patient was out of rehab 12/01/16-12/17/16. Patient needs extension in program due to deconditioning and lengthy time away from rehab.  Will cont to monitor and progress as appropriate.        Expected Outcomes Patient will cont. to increase endurance and stamina through the exercises here at rehab. He will also practice his breathing techinques to become less short of breath on exertion.           Discharge Exercise Prescription (Final Exercise Prescription Changes):     Exercise Prescription Changes - 12/17/16 1500      Response to Exercise   Blood Pressure (Admit) 106/70   Blood Pressure (Exercise) 104/62   Blood Pressure (Exit) 100/60   Heart Rate (Admit) 83 bpm   Heart Rate (Exercise) 95 bpm   Heart Rate (Exit) 69 bpm   Oxygen Saturation (Admit) 92 %   Oxygen Saturation (Exercise) 90 %   Oxygen Saturation (Exit) 100 %   Rating of Perceived Exertion (Exercise) 13   Perceived Dyspnea (Exercise) 3   Duration Progress to 45 minutes of aerobic exercise without signs/symptoms of physical distress   Intensity THRR unchanged     Progression   Progression Continue to progress workloads to maintain intensity without signs/symptoms of physical distress.     Resistance  Training   Training Prescription Yes   Weight green bands   Reps 10-15   Time 10 Minutes     Interval Training   Interval Training No     Oxygen   Oxygen Continuous   Liters 15     Recumbant Bike   Level 5   Minutes 17     NuStep   Level 3  decreased level since hospitalized last week   Minutes 17   METs 2      Nutrition:  Target Goals: Understanding of nutrition guidelines, daily intake of sodium <1552m, cholesterol <2033m calories 30% from fat and 7% or less from saturated fats, daily to have 5 or more servings of fruits and vegetables.  Biometrics:     Pre Biometrics - 08/07/16 1120      Pre Biometrics   Grip Strength 38 kg       Nutrition Therapy Plan and Nutrition Goals:     Nutrition Therapy & Goals - 09/17/16 1554      Nutrition Therapy   Diet High Calorie, High Protein     Personal Nutrition Goals    Nutrition Goal Wt gain to a wt gain goal of 138-150 lb [facility] and counsel regarding individualized specific dietary modifications aiming towards targeted core components such as weight, hypertension, lipid management, diabetes, heart failure and other comorbidities.   Expected Outcomes Short Term Goal: Understand basic principles of dietary content, such as calories, fat, sodium, cholesterol and nutrients.;Long Term Goal: Adherence to prescribed nutrition plan.      Nutrition Discharge: Rate Your Plate Scores:     Nutrition Assessments - 09/17/16 1554      Rate Your Plate Scores   Pre Score 49  score appropriate due to pt needs to gain wt      Nutrition Goals Re-Evaluation:   Nutrition Goals Discharge (Final Nutrition Goals Re-Evaluation):   Psychosocial: Target Goals: Acknowledge presence or absence of significant depression and/or stress, maximize coping skills, provide positive support system. Participant is able to verbalize types and ability to use techniques and skills needed for reducing stress and depression.  Initial Review & Psychosocial Screening:     Initial Psych Review & Screening - 08/07/16 1103      Family Dynamics   Good Support System? Yes     Barriers   Psychosocial barriers to participate in program There are no identifiable barriers or psychosocial needs.     Screening Interventions   Interventions Encouraged to exercise      Quality of Life Scores:   PHQ-9: Recent Review Flowsheet Data    [facility]   Decreased Interest 0   Down, Depressed, Hopeless 0   PHQ - 2 Score 0     Interpretation of Total Score  Total Score Depression Severity:  1-4 = Minimal depression, 5-9 = Mild depression, 10-14 = Moderate depression, 15-19 = Moderately severe depression, 20-27 = Severe depression   Psychosocial Evaluation and Intervention:      Psychosocial Evaluation - 08/07/16 1103      Psychosocial Evaluation & Interventions   Interventions Encouraged to exercise with the program and follow exercise prescription      Psychosocial Re-Evaluation:     Psychosocial Re-Evaluation    [place] 08/24/16 1438 09/14/16 1412 10/05/16 1422 11/09/16 1435 12/01/16 0902     Psychosocial Re-Evaluation   Comments no psychosocial issues identified at this time No  psychosocial issues identified at this time. No psychosocial issues identified No psychosocial issues identified no psychosocial issues have been identified   Interventions Encouraged to attend Pulmonary Rehabilitation for the exercise Encouraged to attend Pulmonary Rehabilitation for the exercise Encouraged to attend Pulmonary Rehabilitation for the exercise Encouraged to attend Pulmonary Rehabilitation for the exercise Encouraged to attend Pulmonary Rehabilitation for the exercise   Continue Psychosocial Services  _0    Row Name 12/24/16 0913             Psychosocial Re-Evaluation   Current issues with None Identified       Interventions Encouraged to attend Pulmonary Rehabilitation for the exercise       Continue Psychosocial Services  No Follow up required          Psychosocial Discharge (Final Psychosocial Re-Evaluation):     Psychosocial Re-Evaluation - 12/24/16 0913      Psychosocial Re-Evaluation   Current issues with None Identified   Interventions Encouraged to attend Pulmonary Rehabilitation for the exercise   Continue Psychosocial Services  No Follow up required      Education: Education Goals: Education classes will be provided on a weekly basis, covering required topics. Participant will state understanding/return demonstration of topics presented.  Learning Barriers/Preferences:     Learning Barriers/Preferences - 08/07/16 1101      Learning Barriers/Preferences   Learning Barriers None   Learning Preferences Computer/Internet;Group  Instruction;Individual Instruction;Written Material;Verbal Instruction      Education Topics: Risk Factor Reduction:  -Group instruction that is supported by a PowerPoint presentation. Instructor discusses the definition of a risk factor, different risk factors for pulmonary disease, and how the heart and lungs work together.     Nutrition for Pulmonary Patient:  -Group instruction provided by PowerPoint slides, verbal discussion, and written materials to support subject matter. The instructor gives an explanation and review of healthy diet recommendations, which includes a discussion on weight management, recommendations for fruit and vegetable consumption, as well as protein, fluid, caffeine, fiber, sodium, sugar, and alcohol. Tips for eating when patients are short of breath are discussed. Flowsheet Row PULMONARY REHAB OTHER RESPIRATORY from 12/17/2016 in [place]  Date  10/29/16  Educator  RD  Instruction Review Code  2- meets goals/outcomes      Pursed Lip Breathing:  -Group instruction that is supported by demonstration and informational handouts. Instructor discusses the benefits of pursed lip and diaphragmatic breathing and detailed demonstration on how to preform both.     Oxygen Safety:  -Group instruction provided by PowerPoint, verbal discussion, and written material to support subject matter. There is an overview of "What is Oxygen" and "Why do we need it".  Instructor also reviews how to create a safe environment for oxygen use, the importance of using oxygen as prescribed, and the risks of noncompliance. There is a brief discussion on traveling with oxygen and resources the patient may utilize. Flowsheet Row PULMONARY REHAB OTHER RESPIRATORY from 12/17/2016 in [place]  Date  08/20/16  Educator  rn  Instruction Review Code  2- meets goals/outcomes      Oxygen Equipment:  -Group instruction provided by [employer] Staff utilizing handouts, written materials, and equipment demonstrations. Flowsheet Row PULMONARY REHAB OTHER RESPIRATORY from 12/17/2016 in [place]  Date  09/03/16  Educator  Rep  Instruction Review Code  2- meets goals/outcomes      Signs and Symptoms:  -Group  instruction provided by written material and verbal discussion to support subject matter. Warning signs and symptoms of infection, stroke, and heart attack are reviewed and when to call the physician/911 reinforced. Tips for preventing the spread of infection discussed. Flowsheet Row PULMONARY REHAB OTHER RESPIRATORY from 12/17/2016 in [place]  Date  11/19/16  Educator  rn  Instruction Review Code  2- meets goals/outcomes      Advanced Directives:  -Group instruction provided by verbal instruction and written material to support subject matter. Instructor reviews Advanced Directive laws and proper instruction for filling out document.   Pulmonary Video:  -Group video education that reviews the importance of medication and oxygen compliance, exercise, good nutrition, pulmonary hygiene, and pursed lip and diaphragmatic breathing for the pulmonary patient.   Exercise for the Pulmonary Patient:  -Group instruction that is supported by a PowerPoint presentation. Instructor discusses benefits of exercise, core components of exercise, frequency, duration, and intensity of an exercise routine, importance of utilizing pulse oximetry during exercise, safety while exercising, and options of places to exercise outside of rehab.   Flowsheet Row PULMONARY REHAB OTHER RESPIRATORY from 12/17/2016 in [place]  Date  09/17/16  Educator  EP  Instruction Review Code  2- meets goals/outcomes      Pulmonary Medications:  -Verbally interactive group education provided by instructor with focus on inhaled medications and proper  administration. Flowsheet Row PULMONARY REHAB OTHER RESPIRATORY from 12/17/2016 in [place]  Date  11/12/16  Educator  Pharm  Instruction Review Code  2- meets goals/outcomes      Anatomy and Physiology of the Respiratory System and Intimacy:  -Group instruction provided by PowerPoint, verbal discussion, and written material to support subject matter. Instructor reviews respiratory cycle and anatomical components of the respiratory system and their functions. Instructor also reviews differences in obstructive and restrictive respiratory diseases with examples of each. Intimacy, Sex, and Sexuality differences are reviewed with a discussion on how relationships can change when diagnosed with pulmonary disease. Common sexual concerns are reviewed.   Knowledge Questionnaire Score:   Core Components/Risk Factors/Patient Goals at Admission:     Personal Goals and Risk Factors at Admission - 08/07/16 1102      Core Components/Risk Factors/Patient Goals on Admission   Increase Strength and Stamina Yes   Intervention Provide advice, education, support and counseling about physical activity/exercise needs.;Develop an individualized exercise prescription for aerobic and resistive training based on initial evaluation findings, risk stratification, comorbidities and participant's personal goals.   Expected Outcomes Achievement of increased cardiorespiratory fitness and enhanced flexibility, muscular endurance and strength shown through measurements of functional capacity and personal statement of participant.   Improve shortness of breath with ADL's Yes   Intervention Provide education, individualized exercise plan and daily activity instruction to help decrease symptoms of SOB with activities of daily living.   Expected Outcomes Short Term: Achieves a reduction of symptoms when performing activities of daily living.   Develop more efficient breathing techniques such as  purse lipped breathing and diaphragmatic breathing; and practicing self-pacing with activity Yes   Intervention Provide education, demonstration and support about specific breathing techniuqes utilized for more efficient breathing. Include techniques such as pursed lipped breathing, diaphragmatic breathing and self-pacing activity.   Expected Outcomes Short Term: Participant will be able to demonstrate and use breathing techniques as needed throughout daily activities.   Increase knowledge of respiratory medications and ability to use respiratory devices properly  Yes   Intervention Provide education and demonstration as needed of appropriate use of medications, inhalers, and oxygen therapy.   Expected Outcomes Short Term: Achieves understanding of medications use. Understands that oxygen is a medication prescribed by physician. Demonstrates appropriate use of inhaler and oxygen therapy.      Core Components/Risk Factors/Patient Goals Review:      Goals and Risk Factor Review    Row Name 08/24/16 1436 09/14/16 1410 10/05/16 1419 11/09/16 1432 12/01/16 0900     Core Components/Risk Factors/Patient Goals Review   Personal Goals Review Increase Strength and Stamina;Develop more efficient breathing techniques such as purse lipped breathing and diaphragmatic breathing and practicing self-pacing with activity.;Improve shortness of breath with ADL's;Increase knowledge of respiratory medications and ability to use respiratory devices properly. Increase Strength and Stamina;Develop more efficient breathing techniques such as purse lipped breathing and diaphragmatic breathing and practicing self-pacing with activity.;Improve shortness of breath with ADL's;Increase knowledge of respiratory medications and ability to use respiratory devices properly. Increase Strength and Stamina;Develop more efficient breathing techniques such as purse lipped breathing and diaphragmatic breathing and practicing self-pacing with  activity.;Improve shortness of breath with ADL's;Increase knowledge of respiratory medications and ability to use respiratory devices properly. Increase Strength and Stamina;Develop more efficient breathing techniques such as purse lipped breathing and diaphragmatic breathing and practicing self-pacing with activity.;Improve shortness of breath with ADL's;Increase knowledge of respiratory medications and ability to use respiratory devices properly. Increase Strength and Stamina;Develop more efficient breathing techniques such as purse lipped breathing and diaphragmatic breathing and practicing self-pacing with activity.;Improve shortness of breath with ADL's;Increase knowledge of respiratory medications and ability to use respiratory devices properly.   Review Has only attended 1 exercise session, too early to have met any goals progressing well, nustep level 4, recumbent bike level 4, 7 laps on track.  Needs 8 liters of oxygen while exercising.   has been sick the last week, equipment has stayed the same, will increase levels when recovered from illness. Becoming more knowledgeable re: oxygen use and how to monitor his oxygen saturations, progressing, purse lip breathing improving Has definitely met his goals, impressed with what he has learned while in program.  will graduate in 2 more sessions   Expected Outcomes expect imrovement in goals in the next 30 days expect his strength and stamina will increase as workloads increase.   continue to progress when recovered to continue meeting goals with time in program to continue exercising independently after discharge   [place] Name 12/24/16 0911             Core Components/Risk Factors/Patient Goals Review   Personal Goals Review Develop more efficient breathing techniques such as purse lipped breathing and diaphragmatic breathing and practicing self-pacing with activity.;Improve shortness of breath with ADL's;Increase knowledge of respiratory medications and  ability to use respiratory devices properly.       Review recent hospitalization for CHF, gained 10 pounds over a week.  Lost the 10 pounds in hospital, oxygen needs have increased.  Using 15 liters while exercising       Expected Outcomes will extend to 36 sessions due to need to strength after hospitalization          Core Components/Risk Factors/Patient Goals at Discharge (Final Review):      Goals and Risk Factor Review - 12/24/16 0911      Core Components/Risk Factors/Patient Goals Review   Personal Goals Review Develop more efficient breathing techniques such as purse lipped breathing and diaphragmatic breathing and practicing self-pacing  with activity.;Improve shortness of breath with ADL's;Increase knowledge of respiratory medications and ability to use respiratory devices properly.   Review recent hospitalization for CHF, gained 10 pounds over a week.  Lost the 10 pounds in hospital, oxygen needs have increased.  Using 15 liters while exercising   Expected Outcomes will extend to 36 sessions due to need to strength after hospitalization      ITP Comments:   Comments:

## 2016-12-31 ENCOUNTER — Encounter (HOSPITAL_COMMUNITY): Payer: Medicare Other

## 2017-01-01 ENCOUNTER — Encounter: Payer: Self-pay | Admitting: Internal Medicine

## 2017-01-01 ENCOUNTER — Ambulatory Visit (INDEPENDENT_AMBULATORY_CARE_PROVIDER_SITE_OTHER): Payer: Medicare Other | Admitting: Internal Medicine

## 2017-01-01 VITALS — BP 122/70 | HR 81 | Ht 63.0 in | Wt 135.0 lb

## 2017-01-01 DIAGNOSIS — I2723 Pulmonary hypertension due to lung diseases and hypoxia: Secondary | ICD-10-CM

## 2017-01-01 DIAGNOSIS — J9611 Chronic respiratory failure with hypoxia: Secondary | ICD-10-CM

## 2017-01-01 DIAGNOSIS — J8410 Pulmonary fibrosis, unspecified: Secondary | ICD-10-CM | POA: Diagnosis not present

## 2017-01-01 DIAGNOSIS — J849 Interstitial pulmonary disease, unspecified: Secondary | ICD-10-CM | POA: Diagnosis not present

## 2017-01-01 NOTE — Patient Instructions (Signed)
Stop fosfamax and discuss alternatives with Dr Charlestine Night  (IV yearly Reclast)   Continue Pantoprazole (protonix) 40 mg   Take  30-60 min before first meal of the day and Pepcid (famotidine)  20 mg one @  bedtime until return to office - this is the best way to tell whether stomach acid is contributing to your problem.     Keep appt for pfts as planned

## 2017-01-01 NOTE — Progress Notes (Signed)
Subjective:   Patient ID: Gilbert Dominguez, male    DOB: 1946/01/07   MRN: [id number]   Brief patient profile:  72  yowm quit smoking 1996 with dx of RA around 2010 followed by [facility] referred 11/23/2013 to pulmonary clinic for ? ILD with pfts c/w mod restriction  09/04/2014 unchanged from 09/2013     History of Present Illness  11/23/2013 1st [facility] office visit/ Wert cc new doe x Jan 2014 and stopped all RA meds(mtx) in august of 2014 - onset was insidious and gradual to point where walk 50 ft esp last month assoc with dry cough but ok  control of arthritis ( vs historical control) and mild chronic nasal congestion s purulent secretions or sinus pain or epistaxis  rec Please see patient coordinator before you leave today  to schedule 02 24/7  2lpm at rest and 4lpm with activity  Prednisone 20 mg twice daily with meals  Pantoprazole (protonix) 40 mg   Take 30-60 min before first meal of the day and Pepcid 20 mg one bedtime until return to office - this is the best way to tell whether stomach acid is contributing to your problem.   GERD diet reviewed   03/08/2014 f/u ov/Wert re: ILD / 02 dep at hs and ex not using 02 as rec  Chief Complaint  Patient presents with  . Follow-up    c/o nonprod cough after cold food/drinks.  No other complaints at this time.   can walk medium pace flat s 02 for 15-20 min and sats are in mid 80s  arthritis and breathing much better since rx with prednisone being tapered down by Dr Charlestine Night to 20 mg per day  rec No need for 02 at rest or room to room walking Please use 2lpm at hs and   2lpm with walking outside more than 200 ft  Please see patient coordinator before you leave today  to schedule a portable system for your daily walk   09/04/2014 f/u ov/Wert re: ILD/ new cough  Chief Complaint  Patient presents with  . Follow-up    PFT done today. Breathing is unchanged. Pt c/o cough for the past 6 wks- "makes me have to clear throat".   just  using 02 at hs / Not limited by breathing from desired activities   Cough is new problem x 6 weeks day > noct min white mucus  On fosfamax  Weaned off pred effective early August  Started acutely with ? [name] feels like persistent pnds  rec I recommend against taking fosfamax as long as you are coughing as it may contribute to the cough  For drainage >>take chlortrimeton (chlorpheniramine) 4 mg every 4 hours available over the counter (may cause drowsiness, available over the counter) For cough>> Take delsym two tsp every 12 hours   to suppress the urge to cough. Swallowing water or using ice chips/non mint and non menthol containing candies (such as lifesavers or sugarless jolly ranchers) are also effective.         11/03/2016  f/u ov/Wert re: PF / pred 10 mg x 3 weeks - has started rehab and req as much as 8lpm with exertion  Chief Complaint  Patient presents with  . Follow-up    Pt. states his breathing has remained unchanged, Still coughing, not producing much, still sob with exertion Denies chest pain or tightness  No change doe = MMRC2  rec Adjust your 02 to maintain your sats in upper 80 or  lower 90s     Admit date: 12/07/2016 Discharge date: 12/12/2016  DISCHARGE DIAGNOSES:  Principal Problem:   Acute on chronic respiratory failure [facility]) Active Problems:   Postinflammatory pulmonary fibrosis (HCC)   Cough   Essential hypertension   Fluid overload   Elevated troponin   Hyperlipidemia   Anemia  01/01/2017  f/u ov/Wert re: post hosp/ transition of care  PF on 02  Chief Complaint  Patient presents with  . Hospitalization Follow-up    Breathing has improved since hospital d/c.     abruptly ill Feb 14th took a deep breath and panicked when couldn't breath or speak while back on fosfamax - note hx at the time was progressive sob x one week and was not admitted until 5 days p his "spell" with evidence of PH noted on admit but neg CTa for PE   No obvious day to day or daytime  variability or assoc excess/ purulent sputum or mucus plugs or hemoptysis or cp or chest tightness, subjective wheeze or overt sinus or hb symptoms. No unusual exp hx or h/o childhood pna/ asthma or knowledge of premature birth.  Sleeping ok without nocturnal  or early am exacerbation  of respiratory  c/o's or need for noct saba. Also denies any obvious fluctuation of symptoms with weather or environmental changes or other aggravating or alleviating factors except as outlined above   Current Medications, Allergies, Complete Past Medical History, Past Surgical History, Family History, and Social History were reviewed in [employer] record.  ROS  The following are not active complaints unless bolded sore throat, dysphagia, dental problems, itching, sneezing,  nasal congestion or excess/ purulent secretions, ear ache,   fever, chills, sweats, unintended wt loss, classically pleuritic or exertional cp,  orthopnea pnd or leg swelling, presyncope, palpitations, abdominal pain, anorexia, nausea, vomiting, diarrhea  or change in bowel or bladder habits, change in stools or urine, dysuria,hematuria,  rash, arthralgias, visual complaints, headache, numbness, weakness or ataxia or problems with walking or coordination,  change in mood/affect or memory.                     Objective:   Physical Exam  amb wm nad / vital signs reviewed -  - Note on arrival 02 sats  93% on 4lpm     09/04/2014      160 > 12/12/2014   162 > 04/11/2015   158  > 11/19/2015   142  > 12/31/2015 140 > 02/11/2016  137 > 04/20/2016  134 > 07/21/2016 134 >  11/03/2016   136  > 01/01/2017    135     03/08/14 159 lb (72.122 kg)  12/08/13 154 lb (69.854 kg)  11/23/13 158 lb 3.2 oz (71.759 kg)           HEENT: nl dentition, turbinates, and oropharynx. Nl external ear canals without cough reflex   NECK :  without JVD/Nodes/TM/ nl carotid upstrokes bilaterally   LUNGS:   Coarse insp crackles in bases and one third  up posteriorly sym bilaterally s cough on insp    CV:  RRR  no s3 or murmur or increase in P2,  Trace  bilaterally sym ankle edema  - pos Raynaud's changes both hands   ABD:  soft and nontender with nl excursion in the supine position. No bruits or organomegaly, bowel sounds nl  MS:  warm without deformities, calf tenderness, cyanosis- mild to mod clubbng   SKIN: warm and dry  without lesions    NEURO:  alert, approp, no deficits       I personally reviewed images and agree with radiology impression as follows:  CTa Chest  12/08/16 1. No definite CT evidence of acute or occlusive PE. Suboptimal opacification of the peripheral branches limits evaluation. 2. Emphysema with pleuroparenchymal scarring and traction bronchiectasis of the lower lobes. Patchy area of ground-glass density in the right upper lobe, likely chronic and less likely an acute pneumonia. Clinical correlation is recommended. 3. Moderate cardiomegaly with evidence of right cardiac dysfunction. Correlation with echocardiogram recommended. 4. Dilated pulmonary arteries suggestive of pulmonary hypertension.   .               Assessment & Plan:

## 2017-01-03 DIAGNOSIS — I2723 Pulmonary hypertension due to lung diseases and hypoxia: Secondary | ICD-10-CM | POA: Insufficient documentation

## 2017-01-03 DIAGNOSIS — J849 Interstitial pulmonary disease, unspecified: Secondary | ICD-10-CM

## 2017-01-03 NOTE — Assessment & Plan Note (Addendum)
-  pfts 09/27/14        VC 2.2(59%) no obst with dlco 38 corrects to 63 - PFTs 09/04/2014 VC 2.3 (62%) no obst with dlco 38 corrects to 83%  - RA sats 62% 11/23/2013 at rest > started 24h 02 (see chronic respiratory failure)  - 12/12/2014  Walked RA  2 laps @ 185 ft each stopped due to  desat to 84% nl pace  - 11/23/2013 ESR 95 rx pred 20 mg bid  >  12/08/2013 = 14 > rx per [facility]  - PFTs 04/11/2015    VC 2.1 s obst and dlco 41 corrects to 75% - ESR 11/19/2015 = 14  - ESR 12/31/2015 =  48 and worse sob/cough > try prednisone 20 mg daily until better and then 10 mg maintenance until follow-up office visit. - PFTs  04/20/2016    VC 2.15 (60%) s obst and dlco 23/22 and corrects to 47%  - 07/21/2016 Referred to rehab > started early Nov 2017  The suddeness of his breathing difficult is not c/w any resp source other than massive pe which was ruled out or VCD for which he is at risk to to LPR/GERD from fosfamax so rec max rx for gerd and stop fosfamax for now in favor of yearly reclast and ma  I had an extended discussion with the patient reviewing all relevant studies (including ER/ hosp records)completed to date and  lasting 15 to 20 minutes of a 25 minute visit    Each maintenance medication was reviewed in detail including most importantly the difference between maintenance and prns and under what circumstances the prns are to be triggered using an action plan format that is not reflected in the computer generated alphabetically organized AVS.    Please see AVS for specific instructions unique to this visit that I personally wrote and verbalized to the the pt in detail and then reviewed with pt  by my nurse highlighting any  changes in therapy recommended at today's visit to their plan of care.   

## 2017-01-03 NOTE — Assessment & Plan Note (Signed)
Echo 12/08/16  size with EF 55-60%. D-shaped interventricular septum   suggestive of RV pressure/volume overload. 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 Severe pulmonary   hypertension. Mild aortic stenosis.  Rx for now is treat the underlying problem/ assure adequate 02

## 2017-01-03 NOTE — Assessment & Plan Note (Signed)
-  RA sats 62% 11/23/2013  - 11/23/2013   Walked 4lpm  x one lap @ 185 stopped due to desat to 73%  - 12/08/2013  Rest 94% RA,  Walked 4lpm x 3 laps @ 185 ft each stopped due to  End of study, sats still 94%  - 03/08/2014  Walked RA  2 laps @ 185 ft each stopped due to sats 86 corrected on 2lpm   - 12/12/2014  Walked RA x 2 laps @ 185 ft each stopped due to  sats 84 nl pace  - 04/11/2015  Walked RA x 2 laps @ 185 ft each stopped due to sats 83%nl pace  - 11/19/2015   Walked RA x one lap @ 185 stopped due to  83% nl pace/ resolved on 2lpm     rec as of  01/03/2017 >>>  4lpm baseline and up to 8lpm with rehab

## 2017-01-05 ENCOUNTER — Encounter (HOSPITAL_COMMUNITY): Payer: Medicare Other

## 2017-01-07 ENCOUNTER — Encounter (HOSPITAL_COMMUNITY)
Admission: RE | Admit: 2017-01-07 | Discharge: 2017-01-07 | Disposition: A | Discharge status: Home / Self Care | Payer: Medicare Other | Source: Ambulatory Visit | Attending: Internal Medicine | Admitting: Internal Medicine

## 2017-01-07 VITALS — Wt 138.0 lb

## 2017-01-07 DIAGNOSIS — J8410 Pulmonary fibrosis, unspecified: Secondary | ICD-10-CM

## 2017-01-07 DIAGNOSIS — J9611 Chronic respiratory failure with hypoxia: Secondary | ICD-10-CM | POA: Diagnosis not present

## 2017-01-07 NOTE — Progress Notes (Signed)
Daily Session Note  Patient Details  Name: Gilbert Dominguez MRN: [id number] Date of Birth: 08-26-1946 Referring Provider:     Pulmonary Rehab Walk Test from 08/11/2016 in [place]  Referring Provider  Dr. Melvyn Novas      Encounter Date: 01/07/2017  Check In:     Session Check In - 01/07/17 1332      Check-In   Location [facility]   Staff Present Trish Fountain, RN, BSN;Molly diVincenzo, MS, ACSM RCEP, Exercise Physiologist;Lisa Ysidro Evert, RN   Supervising physician immediately available to respond to emergencies [facility] immediately available   Physician(s) Dr. Allyson Sabal   Medication changes reported     No   Fall or balance concerns reported    No   Tobacco Cessation No Change   Warm-up and Cool-down Performed as group-led instruction   Resistance Training Performed Yes   VAD Patient? No     Pain Assessment   Currently in Pain? No/denies   Multiple Pain Sites No      Capillary Blood Glucose: No results found for this or any previous visit (from the past 24 hour(s)).      Exercise Prescription Changes - 01/07/17 1617      Response to Exercise   Blood Pressure (Admit) 100/58   Blood Pressure (Exercise) 104/60   Blood Pressure (Exit) 92/64   Heart Rate (Admit) 81 bpm   Heart Rate (Exercise) 93 bpm   Heart Rate (Exit) 71 bpm   Oxygen Saturation (Admit) 81 %  2 liters increased to 92 on 8 liters   Oxygen Saturation (Exercise) 86 %   Oxygen Saturation (Exit) 96 %   Rating of Perceived Exertion (Exercise) 15   Perceived Dyspnea (Exercise) 3   Duration Progress to 45 minutes of aerobic exercise without signs/symptoms of physical distress   Intensity THRR unchanged     Progression   Progression Continue to progress workloads to maintain intensity without signs/symptoms of physical distress.     Resistance Training   Training Prescription Yes   Weight green bands   Reps 10-15   Time 10 Minutes     Interval Training    Interval Training No     Oxygen   Oxygen Continuous   Liters 15     Recumbant Bike   Level 5   Minutes 34      History  Smoking Status  . Former Smoker  . Packs/day: 1.00  . Years: 36.00  . Types: Cigarettes  . Quit date: 10/14/1995  Smokeless Tobacco  . Never Used    Goals Met:  Queuing for purse lip breathing No report of cardiac concerns or symptoms Strength training completed today  Goals Unmet:  O2 Sat. Significant desaturation noted even on 15 liters while exerting and talking. Patient recovered once he was able to PLB.  Comments: Service time is from 1330 to 1510   Dr. Rush Farmer is Medical Director for Pulmonary Rehab at [facility].

## 2017-01-12 ENCOUNTER — Encounter (HOSPITAL_COMMUNITY): Payer: Medicare Other

## 2017-01-14 ENCOUNTER — Encounter (HOSPITAL_COMMUNITY): Payer: Medicare Other

## 2017-01-19 ENCOUNTER — Ambulatory Visit (INDEPENDENT_AMBULATORY_CARE_PROVIDER_SITE_OTHER)
Admission: RE | Admit: 2017-01-19 | Discharge: 2017-01-19 | Disposition: A | Discharge status: Home / Self Care | Payer: Medicare Other | Source: Ambulatory Visit | Attending: Pulmonary Disease | Admitting: Pulmonary Disease

## 2017-01-19 ENCOUNTER — Other Ambulatory Visit: Payer: Medicare Other

## 2017-01-19 ENCOUNTER — Encounter: Payer: Self-pay | Admitting: Pulmonary Disease

## 2017-01-19 ENCOUNTER — Telehealth: Payer: Self-pay | Admitting: Internal Medicine

## 2017-01-19 ENCOUNTER — Telehealth: Payer: Self-pay | Admitting: Pulmonary Disease

## 2017-01-19 ENCOUNTER — Encounter (HOSPITAL_COMMUNITY)
Admission: RE | Admit: 2017-01-19 | Discharge: 2017-01-19 | Disposition: A | Discharge status: Home / Self Care | Payer: Medicare Other | Source: Ambulatory Visit | Attending: Internal Medicine | Admitting: Internal Medicine

## 2017-01-19 ENCOUNTER — Ambulatory Visit (INDEPENDENT_AMBULATORY_CARE_PROVIDER_SITE_OTHER): Payer: Medicare Other | Admitting: Pulmonary Disease

## 2017-01-19 VITALS — Wt 138.7 lb

## 2017-01-19 VITALS — BP 118/72 | HR 74 | Ht 63.0 in | Wt 138.4 lb

## 2017-01-19 DIAGNOSIS — R0602 Shortness of breath: Secondary | ICD-10-CM

## 2017-01-19 DIAGNOSIS — J9611 Chronic respiratory failure with hypoxia: Secondary | ICD-10-CM | POA: Diagnosis not present

## 2017-01-19 DIAGNOSIS — I509 Heart failure, unspecified: Secondary | ICD-10-CM

## 2017-01-19 DIAGNOSIS — J8410 Pulmonary fibrosis, unspecified: Secondary | ICD-10-CM | POA: Diagnosis not present

## 2017-01-19 DIAGNOSIS — R0600 Dyspnea, unspecified: Secondary | ICD-10-CM | POA: Diagnosis not present

## 2017-01-19 LAB — D-DIMER, QUANTITATIVE (NOT AT ARMC): D DIMER QUANT: 0.56 ug{FEU}/mL — AB (ref <0.50)

## 2017-01-19 NOTE — Telephone Encounter (Signed)
Needs OV per TP.  Pt scheduled at 4pm with Dr Vaughan Browner Nothing further needed.

## 2017-01-19 NOTE — Progress Notes (Signed)
Gilbert Dominguez    [id number]    08/10/1946  Primary Care Physician:ELKINS,Gilbert Gilbert Baxter, MD  Referring Physician: Leonard Downing, MD [address] [place], [place] [address]  Chief complaint:  Acute visit for evaluation of hypoxia.  HPI: 71 year old with rheumatoid arthritis, pulmonary fibrosis, chronic respiratory failure on home O2 and baseline prednisone 10 mg.   He was noted to have desats to the 70s with exercise at pulmonary rehabilitation requiring up to 10 L oxygen. He has been sent to pulmonary clinic from rehabilitation for further evaluation. He has 5 pound weight gain with the past few weeks, he dropped his oxygen tank over his left leg with some bruising. There is bilateral progressive lower extremity edema, PND with worsening exertional dyspnea and admits to eating salty Chinese  He was hospitalized in February 2018 with worsening dyspnea, lower extremity swelling, weight gain. He also noted to have choking on 2/14. He underwent a swallow eval which did not show any significant aspiration. He was been diuresed with improvement in symptoms. CTA at that time was negative for pulmonary embolism. Echo was significant for severe pulmonary hypertension with RV pressure and volume overload  Outpatient Encounter Prescriptions as of 01/19/2017  Medication Sig  . aspirin 81 MG tablet Take 81 mg by mouth daily.  Marland Kitchen atorvastatin (LIPITOR) 20 MG tablet Take 20 mg by mouth daily.  . Calcium Carbonate-Vit D-Min (CALCIUM 600+D PLUS MINERALS PO) Take 1 tablet by mouth 2 (two) times daily.  . famotidine (PEPCID) 20 MG tablet TAKE 1 TABLET BY MOUTH AT BEDTIME  . furosemide (LASIX) 20 MG tablet Take 2 tablets (40 mg total) by mouth daily.  Marland Kitchen loratadine (CLARITIN) 10 MG tablet Take 10 mg by mouth daily.  . OXYGEN 4lpm 24/7 and titrate to keep sats above 90 with exertion  [employer]  . pantoprazole (PROTONIX) 40 MG tablet TAKE 1 TABLET BY MOUTH EVERY DAY *TAKE 30-60 MINUTES  BEFORE FIRST MEAL OF THE DAY*  . potassium chloride SA (K-DUR,KLOR-CON) 20 MEQ tablet Take 1 tablet (20 mEq total) by mouth daily.  . predniSONE (DELTASONE) 10 MG tablet TAKE 2 DAILY UNTIL BETTER THEN ONE DAILY UNTIL RETURN  . Pseudoephedrine-Guaifenesin (MUCINEX D MAX STRENGTH) [phone] MG TB12 Take 1 tablet by mouth 2 (two) times daily.   Marland Kitchen terazosin (HYTRIN) 1 MG capsule Take 2 capsules (2 mg total) by mouth at bedtime.   No facility-administered encounter medications on file as of 01/19/2017.     Allergies as of 01/19/2017 - Review Complete 01/19/2017  Allergen Reaction Noted  . Lovastatin Rash 11/23/2013    Past Medical History:  Diagnosis Date  . Chronic respiratory failure ([place])   . Hypertension   . 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 Rheumatoid arthritis(714.0)     Past Surgical History:  Procedure Laterality Date  . ACNE CYST REMOVAL  1968  . VASECTOMY  1973    Family History  Problem Relation Age of Onset  . Heart disease Father   . Liver cancer Mother     Social History   Social History  . Marital status: Single    Spouse name: N/A  . Number of children: 2  . Years of education: N/A   Occupational History  . Retired Optometrist    Social History Main Topics  . Smoking status: Former Smoker    Packs/day: 1.00    Years: 36.00    Types: Cigarettes    Quit date: 10/14/1995  .  Smokeless tobacco: Never Used  . Alcohol use 1.8 oz/week    3 Cans of beer per week     Comment: occasionally  . Drug use: No  . Sexual activity: Not on file   Other Topics Concern  . Not on file   Social History Narrative  . No narrative on file    Review of systems: Review of Systems  Constitutional: Negative for fever and chills.  HENT: Negative.   Eyes: Negative for blurred vision.  Respiratory: as per HPI  Cardiovascular: Negative for chest pain and palpitations.  Gastrointestinal: Negative for vomiting, diarrhea, blood per rectum. Genitourinary:  Negative for dysuria, urgency, frequency and hematuria.  Musculoskeletal: Negative for myalgias, back pain and joint pain.  Skin: Negative for itching and rash.  Neurological: Negative for dizziness, tremors, focal weakness, seizures and loss of consciousness.  Endo/Heme/Allergies: Negative for environmental allergies.  Psychiatric/Behavioral: Negative for depression, suicidal ideas and hallucinations.  All other systems reviewed and are negative.  Physical Exam: Blood pressure 118/72, pulse 74, height _0  (1.6 m), weight 138 lb 6.4 oz (62.8 kg), SpO2 93 %. Gen:      No acute distress HEENT:  EOMI, sclera anicteric Neck:     No masses; no thyromegaly, JVD to angle of jaw Lungs:    B/L crackles, no wheeze; normal respiratory effort CV:         Regular rate and rhythm; no murmurs Abd:      + bowel sounds; soft, non-tender; no palpable masses, no distension Ext:   3+ pitting edema; adequate peripheral perfusion Skin:      Warm and dry; Bruising over rt lower leg Neuro: alert and oriented x 3 Psych: normal mood and affect  Data Reviewed: Chest x-ray 12/07/16- chronic fibrotic lung disease, cardiomegaly CT Angio 12/08/16- no PE, severe emphysema with fibrosis, traction bronchiectasis, patchy groundglass opacities, moderate cardiomegaly, pulmonary hypertension. I have reviewed all images personally  Echo 2/20- - Normal LV size with EF 55-60%. D-shaped interventricular septum suggestive of RV pressure/volume overload. Severely dilated RV with moderate systolic dysfunction. Severe pulmonary hypertension. Mild aortic stenosis.  Assessment:  Rheumatoid arthritis Lung fibrosis. Acute on chronic hypoxic resp failure Volume overload, diastolic heart failure Severe pulmonary HTN with RV dilation, cor pulmonale  Gilbert Dominguez comes to the clinic today with similar presentation is in February 2018 with volume overload, bilateral edema, elevated JVD and signs of cor pulmonale. I have asked him to  lay off the salty food and monitor his weight closely. We'll increase his Lasix to 40 mg twice daily and K replacement to 20 mEq twice daily, increase supplemental o2 to keep sats >90. Check labs today including metabolic panel, BNP, CXR. Suspicion for PE is low but will check a d-dimer for complete evaluation.    He has a scheduled follow-up with Dr. Melvyn Novas as scheduled in 2 weeks when another set of electrolytes can be checked. He has been advised to go to the ED if there is any worsening of his symptoms.   Plan/Recommendations: - Double dose of lasix and Kdur - Titrate supplemental O2 to keep sats > 90 - Check CXR, labs - Avoid salty diet.  - Monitor weights at home  Marshell Garfinkel MD [facility] Pager [phone] 01/19/2017, 4:58 PM  CC: Gilbert Dominguez, *

## 2017-01-19 NOTE — Telephone Encounter (Signed)
Called by [facility] with D-Dimer result = 0.56. Normal is up to 0.50. Given that D-Dimer is minimallly elevated in setting of Rheumatoid Arthritis and ILD (both of which can result in an elevated D-Dimer), do not feel that this lab result reflects a PE. In addition, the BNP = 1131 which is likely d/t chronic pulmonary HTN. Will defer final interpretation and disposition to Dr Vaughan Browner in the morning.

## 2017-01-19 NOTE — Progress Notes (Signed)
Incomplete Session Note  Patient Details  Name: Gilbert Dominguez MRN: [id number] Date of Birth: 06-18-1946 Referring Provider:     Pulmonary Rehab Walk Test from 08/11/2016 in [place]  Referring Provider  Dr. Mirian Capuchin did not complete his rehab session. His time of service was 1330-1500 however he only spent 15 min doing aerobic exercise and take multiple rest breaks for shortness of breath and desaturation.  He was triaged by RN and will be seen by Pulmonologist today at 4 pm for more significant SOB, desaturations, and 3+ pitting edema. RN reviewed low Na handouts and counseled patient on proper CHF care and daily weights. Patient states he had [place] food yesterday with a lot of soy sauce. He has noticed a change in his breathing and a change in his ability to ambulate since. HR and BP stable. See exercise changes flow sheet. Will continue to follow.

## 2017-01-19 NOTE — Patient Instructions (Addendum)
We will check basic metabolic panel, Magnesium, phosphorus, BNP, d-dimer today Increase Lasix to 2 tabs (52m each) twice daily Increase potassium to 1 tablet twice daily We will get CXR today  Return to clinic in 2 weeks with Dr. WMelvyn Novas

## 2017-01-19 NOTE — Telephone Encounter (Signed)
Spoke with Truddie Crumble at [facility], states that the patient is having desats on 10-15 liters into the 70'swith exercise, increased weight gain and 3+ pitting edema, Crackles throughout when listening to lungs. Truddie Crumble is requesting to possibly increasing the Lasix, currently on 37m daily with 240m of Potassium. Pt does not have have a Cardiologist. PoTruddie Crumbleoes not feel the patient needs to go to the ED.   Pt admitted to eating [place] last night. Pt has been given Sodium handouts to make him aware of his Sodium intake.   Dr MaVaughan Browneras an opening today at 4pm if you feel like he needs to be seen.   Please advise TaRexene EdisonNP. Thanks.

## 2017-01-20 ENCOUNTER — Other Ambulatory Visit (INDEPENDENT_AMBULATORY_CARE_PROVIDER_SITE_OTHER): Payer: Medicare Other

## 2017-01-20 DIAGNOSIS — I509 Heart failure, unspecified: Secondary | ICD-10-CM | POA: Diagnosis not present

## 2017-01-20 LAB — PHOSPHORUS: Phosphorus: 3.6 mg/dL (ref 2.3–4.6)

## 2017-01-20 LAB — BASIC METABOLIC PANEL
BUN: 30 mg/dL — AB (ref 6–23)
CALCIUM: 9.9 mg/dL (ref 8.4–10.5)
CO2: 27 mEq/L (ref 19–32)
Chloride: 99 mEq/L (ref 96–112)
Creatinine, Ser: 1.68 mg/dL — ABNORMAL HIGH (ref 0.40–1.50)
GFR: 42.83 mL/min — AB (ref >60.00)
Glucose, Bld: 185 mg/dL — ABNORMAL HIGH (ref 70–99)
POTASSIUM: 3.9 meq/L (ref 3.5–5.1)
SODIUM: 136 meq/L (ref 135–145)

## 2017-01-20 LAB — BRAIN NATRIURETIC PEPTIDE: PRO B NATRI PEPTIDE: 1867 pg/mL — AB (ref 0.0–100.0)

## 2017-01-20 LAB — MAGNESIUM: Magnesium: 2.1 mg/dL (ref 1.5–2.5)

## 2017-01-20 NOTE — Addendum Note (Signed)
Addended by: [name] A on: 01/20/2017 05:15 PM   Modules accepted: Orders

## 2017-01-20 NOTE — Telephone Encounter (Signed)
Agree that borderline elevated D dimer is not concerning for PE. It could also be elevated from rt leg bruising that he got recently. CXR reviewed which shows patchy B/L opacities on chronic lung disease that are consistent with edema. Clinical picture also does not fit with pneumonia.   We will continue the diuresis as planned and follow up in 2 weeks. I have advised him to go to ED if symptoms worsen  Gilbert Dominguez

## 2017-01-21 ENCOUNTER — Encounter (HOSPITAL_COMMUNITY)
Admission: RE | Admit: 2017-01-21 | Discharge: 2017-01-21 | Disposition: A | Discharge status: Home / Self Care | Payer: Medicare Other | Source: Ambulatory Visit | Attending: Internal Medicine | Admitting: Internal Medicine

## 2017-01-21 VITALS — Wt 135.8 lb

## 2017-01-21 DIAGNOSIS — J8410 Pulmonary fibrosis, unspecified: Secondary | ICD-10-CM

## 2017-01-21 DIAGNOSIS — J9611 Chronic respiratory failure with hypoxia: Secondary | ICD-10-CM | POA: Diagnosis not present

## 2017-01-21 NOTE — Progress Notes (Signed)
Daily Session Note  Patient Details  Name: Gilbert Dominguez MRN: [id number] Date of Birth: [birth year] Referring Provider:     Pulmonary Rehab Walk Test from 08/11/2016 in [place]  Referring Provider  Dr. Melvyn Novas      Encounter Date: 01/21/2017  Check In:     Session Check In - 01/21/17 1351      Check-In   Location [facility]   Staff Present Su Hilt, MS, ACSM RCEP, Exercise Physiologist;Lisa Ysidro Evert, RN;Josefita Weissmann Rollene Rotunda, RN, BSN   Supervising physician immediately available to respond to emergencies [facility] immediately available   Physician(s) Dr. Allyson Sabal   Medication changes reported     No   Fall or balance concerns reported    No   Tobacco Cessation No Change   Warm-up and Cool-down Performed as group-led instruction   Resistance Training Performed Yes   VAD Patient? No     Pain Assessment   Currently in Pain? No/denies   Multiple Pain Sites No      Capillary Blood Glucose: Results for orders placed or performed in visit on 01/20/17 (from the past 24 hour(s))  Basic Metabolic Panel (BMET)     Status: Abnormal   Collection Time: 01/20/17  4:42 PM  Result Value Ref Range   Sodium 136 135 - 145 mEq/L   Potassium 3.9 3.5 - 5.1 mEq/L   Chloride 99 96 - 112 mEq/L   CO2 27 19 - 32 mEq/L   Glucose, Bld 185 (H) 70 - 99 mg/dL   BUN 30 (H) 6 - 23 mg/dL   Creatinine, Ser 1.68 (H) 0.40 - 1.50 mg/dL   Calcium 9.9 8.4 - 10.5 mg/dL   GFR 42.83 (L) >60.00 mL/min  Magnesium     Status: None   Collection Time: 01/20/17  4:42 PM  Result Value Ref Range   Magnesium 2.1 1.5 - 2.5 mg/dL  Phosphorus     Status: None   Collection Time: 01/20/17  4:42 PM  Result Value Ref Range   Phosphorus 3.6 2.3 - 4.6 mg/dL  B Nat Peptide     Status: Abnormal   Collection Time: 01/20/17  4:42 PM  Result Value Ref Range   Pro B Natriuretic peptide (BNP) 1,867.0 (H) 0.0 - 100.0 pg/mL        Exercise Prescription Changes - 01/21/17  1608      Response to Exercise   Blood Pressure (Admit) 110/66   Blood Pressure (Exercise) 110/66   Blood Pressure (Exit) 110/72   Heart Rate (Admit) 95 bpm   Heart Rate (Exercise) 111 bpm   Heart Rate (Exit) 77 bpm   Oxygen Saturation (Admit) 89 %   Oxygen Saturation (Exercise) 92 %   Oxygen Saturation (Exit) 93 %   Rating of Perceived Exertion (Exercise) 13   Perceived Dyspnea (Exercise) 2   Duration Progress to 45 minutes of aerobic exercise without signs/symptoms of physical distress   Intensity THRR unchanged     Progression   Progression Continue to progress workloads to maintain intensity without signs/symptoms of physical distress.     Resistance Training   Training Prescription Yes   Weight green bands   Reps 10-15   Time 10 Minutes     Interval Training   Interval Training No     Oxygen   Oxygen Continuous   Liters 6-15     Recumbant Bike   Level 5   Minutes 17     Track   Laps 7  Minutes 17      History  Smoking Status  . Former Smoker  . Packs/day: 1.00  . Years: 36.00  . Types: Cigarettes  . Quit date: 10/14/1995  Smokeless Tobacco  . Never Used    Goals Met:  Improved SOB with ADL's Using PLB without cueing & demonstrates good technique Exercise tolerated well No report of cardiac concerns or symptoms Strength training completed today  Goals Unmet:  Not Applicable  Comments: Service time is from 1330 to 1545   Dr. Rush Farmer is Medical Director for Pulmonary Rehab at [facility].

## 2017-01-26 ENCOUNTER — Telehealth (HOSPITAL_COMMUNITY): Payer: Self-pay | Admitting: *Deleted

## 2017-01-26 ENCOUNTER — Encounter (HOSPITAL_COMMUNITY)
Admission: RE | Admit: 2017-01-26 | Discharge: 2017-01-26 | Disposition: A | Discharge status: Home / Self Care | Payer: Medicare Other | Source: Ambulatory Visit

## 2017-01-26 DIAGNOSIS — J8410 Pulmonary fibrosis, unspecified: Secondary | ICD-10-CM

## 2017-01-26 NOTE — Progress Notes (Signed)
Pulmonary Individual Treatment Plan  Patient Details  Name: Gilbert Dominguez MRN: [id number] Date of Birth: [birth year] Referring Provider:     Pulmonary Rehab Walk Test from 08/11/2016 in [place]  Referring Provider  Dr. Melvyn Novas      Initial Encounter Date:    Pulmonary Rehab Walk Test from 08/11/2016 in [place]  Date  08/11/16  Referring Provider  Dr. 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  aspirin 81 MG tablet, Take 81 mg by mouth daily., Disp: , Rfl:  .  atorvastatin (LIPITOR) 20 MG tablet, Take 20 mg by mouth daily., Disp: , Rfl:  .  Calcium Carbonate-Vit D-Min (CALCIUM 600+D PLUS MINERALS PO), Take 1 tablet by mouth 2 (two) times daily., Disp: , Rfl:  .  famotidine (PEPCID) 20 MG tablet, TAKE 1 TABLET BY MOUTH AT BEDTIME, Disp: 30 tablet, Rfl: 2 .  furosemide (LASIX) 20 MG tablet, Take 2 tablets (40 mg total) by mouth daily., Disp: 60 tablet, Rfl: 0 .  loratadine (CLARITIN) 10 MG tablet, Take 10 mg by mouth daily., Disp: , Rfl:  .  OXYGEN, 4lpm 24/7 and titrate to keep sats above 90 with exertion  [employer], Disp: , Rfl:  .  pantoprazole (PROTONIX) 40 MG tablet, TAKE 1 TABLET BY MOUTH EVERY DAY *TAKE 30-60 MINUTES BEFORE FIRST MEAL OF THE DAY*, Disp: 30 tablet, Rfl: 11 .  potassium chloride SA (K-DUR,KLOR-CON) 20 MEQ tablet, Take 1 tablet (20 mEq total) by mouth daily., Disp: 30 tablet, Rfl: 0 .  predniSONE (DELTASONE) 10 MG tablet, TAKE 2 DAILY UNTIL BETTER THEN ONE DAILY UNTIL RETURN, Disp: 60 tablet, Rfl: 2 .  Pseudoephedrine-Guaifenesin (MUCINEX D MAX STRENGTH) [phone] MG TB12, Take 1 tablet by mouth 2 (two) times daily. , Disp: , Rfl:  .  terazosin (HYTRIN) 1 MG capsule, Take 2 capsules (2 mg total) by mouth at bedtime., Disp: 60 capsule, Rfl: 0  Past Medical History: Past Medical History:   Diagnosis Date  . Chronic respiratory failure ([place])   . Hypertension   . 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 Rheumatoid arthritis(714.0)     Tobacco Use: History  Smoking Status  . Former Smoker  . Packs/day: 1.00  . Years: 36.00  . Types: Cigarettes  . Quit date: 10/14/1995  Smokeless Tobacco  . Never Used    Labs: Recent Review Flowsheet Data    There is no flowsheet data to display.      Capillary Blood Glucose: No results found for: GLUCAP   ADL UCSD:   Pulmonary Function Assessment:     Pulmonary Function Assessment - 08/07/16 1101      Breath   Bilateral Breath Sounds Other   Other course crackles in bases bilat   Shortness of Breath Yes;Limiting activity      Exercise Target Goals:    Exercise Program Goal: Individual exercise prescription set with THRR, safety & activity barriers. Participant demonstrates ability to understand and report RPE using BORG scale, to self-measure pulse accurately, and to acknowledge the importance of the exercise prescription.  Exercise Prescription Goal: Starting with aerobic activity 30 plus minutes a day, 3 days per week for initial exercise prescription. Provide home exercise prescription and guidelines that participant acknowledges understanding prior to discharge.  Activity Barriers & Risk Stratification:     Activity Barriers & Cardiac Risk Stratification - 08/07/16 1100  Activity Barriers & Cardiac Risk Stratification   Activity Barriers Shortness of Breath;Deconditioning      6 Minute Walk:     6 Minute Walk    Row Name 08/11/16 1625         6 Minute Walk   Phase Initial     Distance 600 feet     Walk Time -  4 minutes and 20 seconds total     # of Rest Breaks 2  first rest break 1 minute and 30 seconds --second rest break lasted 10 seconds     RPE 12     Perceived Dyspnea  2     Symptoms Yes (comment)     Comments dizzy when oxygen saturation dropped     Resting HR 82 bpm      Resting BP 95/60     Max Ex. HR 97 bpm     Max Ex. BP 104/63  BP 83/49 at 6 minutes     2 Minute Post BP 91/58       Interval HR   Baseline HR 82     1 Minute HR 89     2 Minute HR 92     3 Minute HR 90     4 Minute HR 94     5 Minute HR 95     6 Minute HR 97     2 Minute Post HR 93     Interval Heart Rate? Yes       Interval Oxygen   Interval Oxygen? Yes     Baseline Oxygen Saturation % 94 %     Baseline Liters of Oxygen 2 L     1 Minute Oxygen Saturation % 85 %     1 Minute Liters of Oxygen 2 L     2 Minute Oxygen Saturation % 80 %  stopped     2 Minute Liters of Oxygen 4 L     3 Minute Oxygen Saturation % 84 %     3 Minute Liters of Oxygen 6 L     4 Minute Oxygen Saturation % 88 %     4 Minute Liters of Oxygen 6 L     5 Minute Oxygen Saturation % 90 %     5 Minute Liters of Oxygen 6 L     6 Minute Oxygen Saturation % 88 %     6 Minute Liters of Oxygen 6 L     2 Minute Post Oxygen Saturation % 88 %     2 Minute Post Liters of Oxygen 6 L        Oxygen Initial Assessment:   Oxygen Re-Evaluation:     Oxygen Re-Evaluation    Row Name 01/25/17 1436             Program Oxygen Prescription   Program Oxygen Prescription Continuous         Home Oxygen   Home Oxygen Device Home Concentrator;E-Tanks       Sleep Oxygen Prescription Continuous       Liters per minute 2       Home Exercise Oxygen Prescription Continuous       Liters per minute 10       Home at Rest Exercise Oxygen Prescription Continuous       Liters per minute 2       Compliance with Home Oxygen Use Yes         Goals/Expected Outcomes   Short Term Goals To  learn and exhibit compliance with exercise, home and travel O2 prescription       Long  Term Goals Exhibits compliance with exercise, home and travel O2 prescription       Comments oxygen needs keep increasing with a component of pulmonary hypertension          Oxygen Discharge (Final Oxygen Re-Evaluation):     Oxygen  Re-Evaluation - 01/25/17 1436      Program Oxygen Prescription   Program Oxygen Prescription Continuous     Home Oxygen   Home Oxygen Device Home Concentrator;E-Tanks   Sleep Oxygen Prescription Continuous   Liters per minute 2   Home Exercise Oxygen Prescription Continuous   Liters per minute 10   Home at Rest Exercise Oxygen Prescription Continuous   Liters per minute 2   Compliance with Home Oxygen Use Yes     Goals/Expected Outcomes   Short Term Goals To learn and exhibit compliance with exercise, home and travel O2 prescription   Long  Term Goals Exhibits compliance with exercise, home and travel O2 prescription   Comments oxygen needs keep increasing with a component of pulmonary hypertension      Initial Exercise Prescription:     Initial Exercise Prescription - 08/11/16 1600      Date of Initial Exercise RX and Referring Provider   Date 08/11/16   Referring Provider Dr. Melvyn Novas     Oxygen   Oxygen Continuous   Liters 6     Recumbant Bike   Level 2   Minutes 17     NuStep   Level 2   Minutes 17   METs 1.5     Track   Laps 5   Minutes 17     Prescription Details   Frequency (times per week) 2   Duration Progress to 45 minutes of aerobic exercise without signs/symptoms of physical distress     Intensity   THRR 40-80% of Max Heartrate 59-118   Ratings of Perceived Exertion 11-13   Perceived Dyspnea 0-4     Progression   Progression Continue progressive overload as per policy without signs/symptoms or physical distress.     Resistance Training   Training Prescription Yes   Weight green bands   Reps 10-12      Perform Capillary Blood Glucose checks as needed.  Exercise Prescription Changes:     Exercise Prescription Changes    Row Name 08/20/16 1600 08/25/16 1500 08/27/16 1600 09/01/16 1500 09/03/16 1500     Response to Exercise   Blood Pressure (Admit) 114/50 110/46 90/40 100/50 106/60   Blood Pressure (Exercise) 120/60 104/60 130/70 98/50  96/64   Blood Pressure (Exit) 106/64 106/64 104/62 100/60 98/64   Heart Rate (Admit) 73 bpm 81 bpm 84 bpm 89 bpm 70 bpm   Heart Rate (Exercise) 79 bpm 103 bpm 102 bpm 97 bpm 72 bpm   Heart Rate (Exit) 63 bpm 92 bpm 70 bpm 75 bpm 61 bpm   Oxygen Saturation (Admit) 89 % 92 % 91 % 98 % 98 %   Oxygen Saturation (Exercise) 86 %  O2 increased to 8L with exercise sat improved to 93. 91 % 90 % 90 % 94 %   Oxygen Saturation (Exit) 98 % 100 % 100 % 95 % 94 %   Rating of Perceived Exertion (Exercise) _0 Perceived Dyspnea (Exercise) _1 Duration Progress to 45 minutes of aerobic exercise without signs/symptoms of physical  distress Progress to 45 minutes of aerobic exercise without signs/symptoms of physical distress Progress to 45 minutes of aerobic exercise without signs/symptoms of physical distress Progress to 45 minutes of aerobic exercise without signs/symptoms of physical distress Progress to 45 minutes of aerobic exercise without signs/symptoms of physical distress   Intensity Other (comment)  40-80% of HRR THRR unchanged THRR unchanged THRR unchanged THRR unchanged     Progression   Progression Continue to progress workloads to maintain intensity without signs/symptoms of physical distress. Continue to progress workloads to maintain intensity without signs/symptoms of physical distress. Continue to progress workloads to maintain intensity without signs/symptoms of physical distress. Continue to progress workloads to maintain intensity without signs/symptoms of physical distress. Continue to progress workloads to maintain intensity without signs/symptoms of physical distress.     Resistance Training   Training Prescription _0    Weight _1    Reps 10-12  10 minutes of strength training 10-12  10 minutes of strength training 10-12  10 minutes of strength training 10-12  10 minutes of strength training  10-12  10 minutes of strength training     Interval Training   Interval Training _2      Oxygen   Oxygen _3    Liters _4 Recumbant Bike   Level  - 2  - 3 3   Minutes  - 17  - 17 17     NuStep   Level 2 3  - 4 4   Minutes 17 17  - 17 17   METs 2.3  -  - 2.3 1.8     Track   Laps _5 -   Minutes 17 17 34 17  -     Exercise Review   Progression  - Yes  - Yes  -   Row Name 09/08/16 1552 09/15/16 1600 09/17/16 1600 09/22/16 1500 09/24/16 1548     Response to Exercise   Blood Pressure (Admit) 98/50 96/50 1_6   Blood Pressure (Exercise) 99/69 100/50 100/68 94/50 109/70   Blood Pressure (Exit) 100/60 1_7 92/62   Heart Rate (Admit) 85 bpm 93 bpm 72 bpm 80 bpm 76 bpm   Heart Rate (Exercise) 91 bpm 97 bpm 90 bpm 90 bpm 82 bpm   Heart Rate (Exit) 77 bpm 73 bpm 69 bpm 89 bpm 88 bpm   Oxygen Saturation (Admit) 91 % 92 % 99 % 89 % 92 %   Oxygen Saturation (Exercise) 88 % 91 % 87 % 90 % 92 %   Oxygen Saturation (Exit) 100 % 99 % 96 % 100 % 95 %   Rating of Perceived Exertion (Exercise) _8 Perceived Dyspnea (Exercise) _9 Duration Progress to 45 minutes of aerobic exercise without signs/symptoms of physical distress Progress to 45 minutes of aerobic exercise without signs/symptoms of physical distress Progress to 45 minutes of aerobic exercise without signs/symptoms of physical distress Progress to 45 minutes of aerobic exercise without signs/symptoms of physical distress Progress to 45 minutes of aerobic exercise without signs/symptoms of physical distress   Intensity _10      Progression   Progression Continue to progress workloads to maintain intensity without signs/symptoms of physical distress. Continue to progress workloads to  maintain intensity without signs/symptoms of physical  distress. Continue to progress workloads to maintain intensity without signs/symptoms of physical distress. Continue to progress workloads to maintain intensity without signs/symptoms of physical distress. Continue to progress workloads to maintain intensity without signs/symptoms of physical distress.     Resistance Training   Training Prescription _0    Weight _1    Reps 10-12  10 minutes of strength training 10-12  10 minutes of strength training 10-12  10 minutes of strength training 10-12  10 minutes of strength training 10-12  10 minutes of strength training     Interval Training   Interval Training _2      Oxygen   Oxygen _3    Liters _4 Recumbant Bike   Level 4 4  - 4  -   Minutes 17 17  - 17  -     NuStep   Level _5 Minutes _6 METs 2.2 2.4  - 2.4 2.6     Track   Laps _7 Minutes _8 Exercise Review   Progression Yes  -  -  -  -   Row Name 10/06/16 1600 10/08/16 1605 10/20/16 1500 10/29/16 1500 11/03/16 1500     Response to Exercise   Blood Pressure (Admit) _9 104/60 92/50   Blood Pressure (Exercise) 96/50 102/50 94/52 100/70 136/70   Blood Pressure (Exit) 98/58 96/64 106/60 112/60 94/50   Heart Rate (Admit) 76 bpm 75 bpm 91 bpm 90 bpm 82 bpm   Heart Rate (Exercise) 106 bpm 97 bpm 102 bpm 106 bpm 98 bpm   Heart Rate (Exit) 64 bpm 70 bpm 72 bpm 68 bpm 73 bpm   Oxygen Saturation (Admit) 93 % 97 % 90 % 93 % 99 %   Oxygen Saturation (Exercise) 87 % 84 %  increased 90 86 % 86 %  slowly improved to 89 with rest, continue to assess 92 %   Oxygen Saturation (Exit) 94 % 91 % 98 % 96 % 100 %   Rating of Perceived Exertion (Exercise) _10 Perceived Dyspnea (Exercise) _11 Duration Progress to 45 minutes of aerobic exercise without signs/symptoms of  physical distress Progress to 45 minutes of aerobic exercise without signs/symptoms of physical distress Progress to 45 minutes of aerobic exercise without signs/symptoms of physical distress Progress to 45 minutes of aerobic exercise without signs/symptoms of physical distress Progress to 45 minutes of aerobic exercise without signs/symptoms of physical distress   Intensity _12      Progression   Progression Continue to progress workloads to maintain intensity without signs/symptoms of physical distress. Continue to progress workloads to maintain intensity without signs/symptoms of physical distress. Continue to progress workloads to maintain intensity without signs/symptoms of physical distress. Continue to progress workloads to maintain intensity without signs/symptoms of physical distress. Continue to progress workloads to maintain intensity without signs/symptoms of physical distress.     Resistance Training   Training Prescription _13    Weight _14    Reps 10-12  10 minutes of strength training  10-12  10 minutes of strength training 10-12  10 minutes of strength training 10-12  10 minutes of strength training 10-12  10 minutes of strength training     Interval Training   Interval Training _0      Oxygen   Oxygen _1    Liters _2 Recumbant Bike   Level 4 4.5 5._3 Minutes _4 NuStep   Level _5 Minutes _6 METs 2.6 2.9 2.2 2.2 2.1     Track   Laps 7  - 8  - 3   Minutes 17  - 17  - 17     Exercise Review   Progression Yes  - Yes  -  -   Row Name 11/10/16 1500 11/12/16 1532 11/17/16 1519 11/19/16 1604 11/24/16 1500     Response to Exercise   Blood Pressure (Admit) 94/60 96/61 100/52 100/50 98/62   Blood Pressure (Exercise)  100/70 96/60 121/74 103/65 100/62   Blood Pressure (Exit) _7 98/60 92/52   Heart Rate (Admit) 84 bpm 75 bpm 78 bpm 80 bpm 78 bpm   Heart Rate (Exercise) 107 bpm 97 bpm 97 bpm 105 bpm 98 bpm   Heart Rate (Exit) 66 bpm 68 bpm 67 bpm 68 bpm 70 bpm   Oxygen Saturation (Admit) 94 % 91 % 100 % 93 % 98 %   Oxygen Saturation (Exercise) 84 %  with rest increased to 88% later sat 86 O2 increased to 10L 87 % 85 % 88 % 91 %   Oxygen Saturation (Exit) 100 % 100 % 100 % 100 % 100 %   Rating of Perceived Exertion (Exercise) _8 Perceived Dyspnea (Exercise) _9 Duration Progress to 45 minutes of aerobic exercise without signs/symptoms of physical distress Progress to 45 minutes of aerobic exercise without signs/symptoms of physical distress Progress to 45 minutes of aerobic exercise without signs/symptoms of physical distress Progress to 45 minutes of aerobic exercise without signs/symptoms of physical distress Progress to 45 minutes of aerobic exercise without signs/symptoms of physical distress   Intensity _10      Progression   Progression Continue to progress workloads to maintain intensity without signs/symptoms of physical distress. Continue to progress workloads to maintain intensity without signs/symptoms of physical distress. Continue to progress workloads to maintain intensity without signs/symptoms of physical distress. Continue to progress workloads to maintain intensity without signs/symptoms of physical distress. Continue to progress workloads to maintain intensity without signs/symptoms of physical distress.     Resistance Training   Training Prescription _11    Weight _12    Reps 10-12  10 minutes of strength training 10-12  10 minutes of strength training 10-12  10 minutes of strength training 10-12  10 minutes of  strength training 10-12  10 minutes of strength training     Interval Training   Interval Training _13      Oxygen   Oxygen _14    Liters 8-_15 Recumbant Bike   Level _16 Minutes 52 84  _0 NuStep   Level 5  - 4  - 5   Minutes 17  - 17  - 17   METs 2.2  - 2.2  - 2     Track   Laps _1 Minutes _2 Row Name 11/26/16 1539 12/17/16 1500 01/07/17 1617 01/19/17 1330 01/21/17 1608     Response to Exercise   Blood Pressure (Admit) 104/60 106/70 100/58 108/66 110/66   Blood Pressure (Exercise) 112/60 104/62 104/60 106/60 110/66   Blood Pressure (Exit) 116/78 100/60 92/64  - 110/72   Heart Rate (Admit) 77 bpm 83 bpm 81 bpm 85 bpm 95 bpm   Heart Rate (Exercise) 95 bpm 95 bpm 93 bpm 96 bpm 111 bpm   Heart Rate (Exit) 67 bpm 69 bpm 71 bpm  - 77 bpm   Oxygen Saturation (Admit) 93 % 92 % 81 %  2 liters increased to 92 on 8 liters 91 %   on 6 liters 89 %   Oxygen Saturation (Exercise) 86 % 90 % 86 % 79 %  increased to 87 on 10-15 liters 92 %   Oxygen Saturation (Exit) 100 % 100 % 96 % 98 %  10 liters at rest 93 %   Rating of Perceived Exertion (Exercise) _3 Perceived Dyspnea (Exercise) _4 Duration Progress to 45 minutes of aerobic exercise without signs/symptoms of physical distress Progress to 45 minutes of aerobic exercise without signs/symptoms of physical distress Progress to 45 minutes of aerobic exercise without signs/symptoms of physical distress Progress to 45 minutes of aerobic exercise without signs/symptoms of physical distress Progress to 45 minutes of aerobic exercise without signs/symptoms of physical distress   Intensity _5      Progression   Progression Continue to progress workloads to maintain intensity without signs/symptoms of physical distress. Continue to progress  workloads to maintain intensity without signs/symptoms of physical distress. Continue to progress workloads to maintain intensity without signs/symptoms of physical distress. Continue to progress workloads to maintain intensity without signs/symptoms of physical distress. Continue to progress workloads to maintain intensity without signs/symptoms of physical distress.     Resistance Training   Training Prescription _6    Weight _7    Reps 10-12  10 minutes of strength training 10-15 10-15 10-15 10-15   Time  - 10 Minutes 10 Minutes 10 Minutes 10 Minutes     Interval Training   Interval Training _8      Oxygen   Oxygen _9    Liters _10 6-15 6-15     Recumbant Bike   Level _11 decreased to level 1 for desaturation 5   Minutes 17 17 34 10 17     NuStep   Level  - 3  decreased level since hospitalized last week  -  -  -   Minutes  - 17  -  -  -   METs  - 2  -  -  -     Track   Laps 5  -  -  - 7   Minutes 17  -  -  - 17      Exercise Comments:  Exercise Comments    Row Name 08/24/16 1025 09/21/16 1645 10/08/16 1003 11/10/16 0829 11/30/16 1613   Exercise Comments Patient has only attended one session. Will cont. to monitor. Patient is doing well in PR. Open to workload increases. Works hard when he is here. Oxygen saturation is monitored closely. Will cont. to monitor and progress. Patient is doing well in PR. Open to workload increases. Works hard when he is here. Oxygen saturation is monitored closely. Will cont. to monitor and progress. Patient is slowly progressing in rehab. MET levels place him at a low level. Will cont. to monitor and progress.  Patient is slowly progressing in rehab. MET levels place him at a low level. Keeping oxygen saturations up has been a problem--now on 10 liters with most exercise. Will cont. to monitor and  progress.       Exercise Goals and Review:   Exercise Goals Re-Evaluation :     Exercise Goals Re-Evaluation    Row Name 12/28/16 1159 01/26/17 0754           Exercise Goal Re-Evaluation   Exercise Goals Review Increase Physical Activity;Increase Strenth and Stamina Increase Physical Activity;Increase Strenth and Stamina      Comments Patient was out of rehab 12/01/16-12/17/16. Patient needs extension in program due to deconditioning and lengthy time away from rehab. Will cont to monitor and progress as appropriate.  Patient needs extension in program due to deconditioning and lengthy time away from rehab. Setbacks in workload intensities. Will cont to monitor and progress as appropriate.       Expected Outcomes Patient will cont. to increase endurance and stamina through the exercises here at rehab. He will also practice his breathing techinques to become less short of breath on exertion.  Patient will cont. to increase endurance and stamina through the exercises here at rehab. He will also practice his breathing techinques to become less short of breath on exertion.          Discharge Exercise Prescription (Final Exercise Prescription Changes):     Exercise Prescription Changes - 01/21/17 1608      Response to Exercise   Blood Pressure (Admit) 110/66   Blood Pressure (Exercise) 110/66   Blood Pressure (Exit) 110/72   Heart Rate (Admit) 95 bpm   Heart Rate (Exercise) 111 bpm   Heart Rate (Exit) 77 bpm   Oxygen Saturation (Admit) 89 %   Oxygen Saturation (Exercise) 92 %   Oxygen Saturation (Exit) 93 %   Rating of Perceived Exertion (Exercise) 13   Perceived Dyspnea (Exercise) 2   Duration Progress to 45 minutes of aerobic exercise without signs/symptoms of physical distress   Intensity THRR unchanged     Progression   Progression Continue to progress workloads to maintain intensity without signs/symptoms of physical distress.     Resistance Training   Training Prescription  Yes   Weight green bands   Reps 10-15   Time 10 Minutes     Interval Training   Interval Training No     Oxygen   Oxygen Continuous   Liters 6-15     Recumbant Bike   Level 5   Minutes 17     Track   Laps 7   Minutes 17      Nutrition:  Target Goals: Understanding of nutrition guidelines, daily intake of sodium <1592m, cholesterol <2020m calories 30% from fat and 7% or less from saturated fats, daily to have 5 or more servings of fruits and vegetables.  Biometrics:  Pre Biometrics - 08/07/16 1120      Pre Biometrics   Grip Strength 38 kg       Nutrition Therapy Plan and Nutrition Goals:     Nutrition Therapy & Goals - 09/17/16 1554      Nutrition Therapy   Diet High Calorie, High Protein     Personal Nutrition Goals   Nutrition Goal Wt gain to a wt gain goal of 138-150 lb at graduation from [facility], educate and counsel regarding individualized specific dietary modifications aiming towards targeted core components such as weight, hypertension, lipid management, diabetes, heart failure and other comorbidities.   Expected Outcomes Short Term Goal: Understand basic principles of dietary content, such as calories, fat, sodium, cholesterol and nutrients.;Long Term Goal: Adherence to prescribed nutrition plan.      Nutrition Discharge: Rate Your Plate Scores:     Nutrition Assessments - 09/17/16 1554      Rate Your Plate Scores   Pre Score 49  score appropriate due to pt needs to gain wt      Nutrition Goals Re-Evaluation:   Nutrition Goals Discharge (Final Nutrition Goals Re-Evaluation):   Psychosocial: Target Goals: Acknowledge presence or absence of significant depression and/or stress, maximize coping skills, provide positive support system. Participant is able to verbalize types and ability to use techniques and skills needed for reducing stress and depression.  Initial Review &  Psychosocial Screening:     Initial Psych Review & Screening - 08/07/16 1103      Family Dynamics   Good Support System? Yes     Barriers   Psychosocial barriers to participate in program There are no identifiable barriers or psychosocial needs.     Screening Interventions   Interventions Encouraged to exercise      Quality of Life Scores:   PHQ-9: Recent Review Flowsheet Data    Depression screen [facility] 2/9 08/07/2016   Decreased Interest 0   Down, Depressed, Hopeless 0   PHQ - 2 Score 0     Interpretation of Total Score  Total Score Depression Severity:  1-4 = Minimal depression, 5-9 = Mild depression, 10-14 = Moderate depression, 15-19 = Moderately severe depression, 20-27 = Severe depression   Psychosocial Evaluation and Intervention:     Psychosocial Evaluation - 08/07/16 1103      Psychosocial Evaluation & Interventions   Interventions Encouraged to exercise with the program and follow exercise prescription      Psychosocial Re-Evaluation:     Psychosocial Re-Evaluation    [place] Name 08/24/16 1438 09/14/16 1412 10/05/16 1422 11/09/16 1435 12/01/16 0902     Psychosocial Re-Evaluation   Comments no psychosocial issues identified at this time No psychosocial issues identified at this time. No psychosocial issues identified No psychosocial issues identified no psychosocial issues have been identified   Interventions Encouraged to attend Pulmonary Rehabilitation for the exercise Encouraged to attend Pulmonary Rehabilitation for the exercise Encouraged to attend Pulmonary Rehabilitation for the exercise Encouraged to attend Pulmonary Rehabilitation for the exercise Encouraged to attend Pulmonary Rehabilitation for the exercise   Continue Psychosocial Services  _0    Row Name 12/24/16 0913 01/25/17 1448           Psychosocial Re-Evaluation   Current issues with None Identified None Identified      Interventions Encouraged to attend Pulmonary  Rehabilitation for the exercise Encouraged to attend Pulmonary Rehabilitation for the exercise  Continue Psychosocial Services  No Follow up required No Follow up required         Psychosocial Discharge (Final Psychosocial Re-Evaluation):     Psychosocial Re-Evaluation - 01/25/17 1448      Psychosocial Re-Evaluation   Current issues with None Identified   Interventions Encouraged to attend Pulmonary Rehabilitation for the exercise   Continue Psychosocial Services  No Follow up required      Education: Education Goals: Education classes will be provided on a weekly basis, covering required topics. Participant will state understanding/return demonstration of topics presented.  Learning Barriers/Preferences:     Learning Barriers/Preferences - 08/07/16 1101      Learning Barriers/Preferences   Learning Barriers None   Learning Preferences Computer/Internet;Group Instruction;Individual Instruction;Written Material;Verbal Instruction      Education Topics: Risk Factor Reduction:  -Group instruction that is supported by a PowerPoint presentation. Instructor discusses the definition of a risk factor, different risk factors for pulmonary disease, and how the heart and lungs work together.     PULMONARY REHAB OTHER RESPIRATORY from 01/21/2017 in [place]  Date  01/07/17  Educator  EP  Instruction Review Code  2- meets goals/outcomes      Nutrition for Pulmonary Patient:  -Group instruction provided by PowerPoint slides, verbal discussion, and written materials to support subject matter. The instructor gives an explanation and review of healthy diet recommendations, which includes a discussion on weight management, recommendations for fruit and vegetable consumption, as well as protein, fluid, caffeine, fiber, sodium, sugar, and alcohol. Tips for eating when patients are short of breath are discussed.   PULMONARY REHAB OTHER RESPIRATORY from  01/21/2017 in [place]  Date  10/29/16  Educator  RD  Instruction Review Code  2- meets goals/outcomes      Pursed Lip Breathing:  -Group instruction that is supported by demonstration and informational handouts. Instructor discusses the benefits of pursed lip and diaphragmatic breathing and detailed demonstration on how to preform both.     Oxygen Safety:  -Group instruction provided by PowerPoint, verbal discussion, and written material to support subject matter. There is an overview of "What is Oxygen" and "Why do we need it".  Instructor also reviews how to create a safe environment for oxygen use, the importance of using oxygen as prescribed, and the risks of noncompliance. There is a brief discussion on traveling with oxygen and resources the patient may utilize.   PULMONARY REHAB OTHER RESPIRATORY from 01/21/2017 in [place]  Date  01/21/17  Educator  rn  Instruction Review Code  2- meets goals/outcomes      Oxygen Equipment:  -Group instruction provided by [facility] Staff utilizing handouts, written materials, and equipment demonstrations.   PULMONARY REHAB OTHER RESPIRATORY from 01/21/2017 in [place]  Date  09/03/16  Educator  Rep  Instruction Review Code  2- meets goals/outcomes      Signs and Symptoms:  -Group instruction provided by written material and verbal discussion to support subject matter. Warning signs and symptoms of infection, stroke, and heart attack are reviewed and when to call the physician/911 reinforced. Tips for preventing the spread of infection discussed.   PULMONARY REHAB OTHER RESPIRATORY from 01/21/2017 in [place]  Date  11/19/16  Educator  rn  Instruction Review Code  2- meets goals/outcomes      Advanced Directives:  -Group instruction provided by verbal instruction and written  material to support subject  matter. Instructor reviews Advanced Directive laws and proper instruction for filling out document.   Pulmonary Video:  -Group video education that reviews the importance of medication and oxygen compliance, exercise, good nutrition, pulmonary hygiene, and pursed lip and diaphragmatic breathing for the pulmonary patient.   Exercise for the Pulmonary Patient:  -Group instruction that is supported by a PowerPoint presentation. Instructor discusses benefits of exercise, core components of exercise, frequency, duration, and intensity of an exercise routine, importance of utilizing pulse oximetry during exercise, safety while exercising, and options of places to exercise outside of rehab.     PULMONARY REHAB OTHER RESPIRATORY from 01/21/2017 in [place]  Date  09/17/16  Educator  EP  Instruction Review Code  2- meets goals/outcomes      Pulmonary Medications:  -Verbally interactive group education provided by instructor with focus on inhaled medications and proper administration.   PULMONARY REHAB OTHER RESPIRATORY from 01/21/2017 in [place]  Date  11/12/16  Educator  Pharm  Instruction Review Code  2- meets goals/outcomes      Anatomy and Physiology of the Respiratory System and Intimacy:  -Group instruction provided by PowerPoint, verbal discussion, and written material to support subject matter. Instructor reviews respiratory cycle and anatomical components of the respiratory system and their functions. Instructor also reviews differences in obstructive and restrictive respiratory diseases with examples of each. Intimacy, Sex, and Sexuality differences are reviewed with a discussion on how relationships can change when diagnosed with pulmonary disease. Common sexual concerns are reviewed.   Knowledge Questionnaire Score:   Core Components/Risk Factors/Patient Goals at Admission:     Personal Goals and Risk Factors at  Admission - 08/07/16 1102      Core Components/Risk Factors/Patient Goals on Admission   Increase Strength and Stamina Yes   Intervention Provide advice, education, support and counseling about physical activity/exercise needs.;Develop an individualized exercise prescription for aerobic and resistive training based on initial evaluation findings, risk stratification, comorbidities and participant's personal goals.   Expected Outcomes Achievement of increased cardiorespiratory fitness and enhanced flexibility, muscular endurance and strength shown through measurements of functional capacity and personal statement of participant.   Improve shortness of breath with ADL's Yes   Intervention Provide education, individualized exercise plan and daily activity instruction to help decrease symptoms of SOB with activities of daily living.   Expected Outcomes Short Term: Achieves a reduction of symptoms when performing activities of daily living.   Develop more efficient breathing techniques such as purse lipped breathing and diaphragmatic breathing; and practicing self-pacing with activity Yes   Intervention Provide education, demonstration and support about specific breathing techniuqes utilized for more efficient breathing. Include techniques such as pursed lipped breathing, diaphragmatic breathing and self-pacing activity.   Expected Outcomes Short Term: Participant will be able to demonstrate and use breathing techniques as needed throughout daily activities.   Increase knowledge of respiratory medications and ability to use respiratory devices properly  Yes   Intervention Provide education and demonstration as needed of appropriate use of medications, inhalers, and oxygen therapy.   Expected Outcomes Short Term: Achieves understanding of medications use. Understands that oxygen is a medication prescribed by physician. Demonstrates appropriate use of inhaler and oxygen therapy.      Core Components/Risk  Factors/Patient Goals Review:      Goals and Risk Factor Review    Row Name 08/24/16 1436 09/14/16 1410 10/05/16 1419 11/09/16 1432 12/01/16 0900  Core Components/Risk Factors/Patient Goals Review   Personal Goals Review Increase Strength and Stamina;Develop more efficient breathing techniques such as purse lipped breathing and diaphragmatic breathing and practicing self-pacing with activity.;Improve shortness of breath with ADL's;Increase knowledge of respiratory medications and ability to use respiratory devices properly. Increase Strength and Stamina;Develop more efficient breathing techniques such as purse lipped breathing and diaphragmatic breathing and practicing self-pacing with activity.;Improve shortness of breath with ADL's;Increase knowledge of respiratory medications and ability to use respiratory devices properly. Increase Strength and Stamina;Develop more efficient breathing techniques such as purse lipped breathing and diaphragmatic breathing and practicing self-pacing with activity.;Improve shortness of breath with ADL's;Increase knowledge of respiratory medications and ability to use respiratory devices properly. Increase Strength and Stamina;Develop more efficient breathing techniques such as purse lipped breathing and diaphragmatic breathing and practicing self-pacing with activity.;Improve shortness of breath with ADL's;Increase knowledge of respiratory medications and ability to use respiratory devices properly. Increase Strength and Stamina;Develop more efficient breathing techniques such as purse lipped breathing and diaphragmatic breathing and practicing self-pacing with activity.;Improve shortness of breath with ADL's;Increase knowledge of respiratory medications and ability to use respiratory devices properly.   Review Has only attended 1 exercise session, too early to have met any goals progressing well, nustep level 4, recumbent bike level 4, 7 laps on track.  Needs 8 liters of  oxygen while exercising.   has been sick the last week, equipment has stayed the same, will increase levels when recovered from illness. Becoming more knowledgeable re: oxygen use and how to monitor his oxygen saturations, progressing, purse lip breathing improving Has definitely met his goals, impressed with what he has learned while in program.  will graduate in 2 more sessions   Expected Outcomes expect imrovement in goals in the next 30 days expect his strength and stamina will increase as workloads increase.   continue to progress when recovered to continue meeting goals with time in program to continue exercising independently after discharge   [place] Name 12/24/16 0911 01/25/17 1440           Core Components/Risk Factors/Patient Goals Review   Personal Goals Review Develop more efficient breathing techniques such as purse lipped breathing and diaphragmatic breathing and practicing self-pacing with activity.;Improve shortness of breath with ADL's;Increase knowledge of respiratory medications and ability to use respiratory devices properly. Develop more efficient breathing techniques such as purse lipped breathing and diaphragmatic breathing and practicing self-pacing with activity.;Improve shortness of breath with ADL's      Review recent hospitalization for CHF, gained 10 pounds over a week.  Lost the 10 pounds in hospital, oxygen needs have increased.  Using 15 liters while exercising  continues to have CHF issues despite Lasix, oxygen requirements are continuing to increase      Expected Outcomes will extend to 36 sessions due to need to strength after hospitalization progress as tolerated         Core Components/Risk Factors/Patient Goals at Discharge (Final Review):      Goals and Risk Factor Review - 01/25/17 1440      Core Components/Risk Factors/Patient Goals Review   Personal Goals Review Develop more efficient breathing techniques such as purse lipped breathing and diaphragmatic  breathing and practicing self-pacing with activity.;Improve shortness of breath with ADL's   Review  continues to have CHF issues despite Lasix, oxygen requirements are continuing to increase   Expected Outcomes progress as tolerated      ITP Comments:   Comments: ITP REVIEW Pt is making expected progress toward  pulmonary rehab goals after completing 26 sessions. Recommend continued exercise, life style modification, education, and utilization of breathing techniques to increase stamina and strength and decrease shortness of breath with exertion.

## 2017-01-27 ENCOUNTER — Other Ambulatory Visit: Payer: Self-pay | Admitting: Internal Medicine

## 2017-01-27 MED ORDER — FAMOTIDINE 20 MG PO TABS: 20.0000 mg | ORAL_TABLET | Freq: Every day | ORAL | 1 refills | Status: AC

## 2017-01-28 ENCOUNTER — Other Ambulatory Visit: Payer: Self-pay | Admitting: Internal Medicine

## 2017-01-28 ENCOUNTER — Encounter (HOSPITAL_COMMUNITY): Admission: RE | Admit: 2017-01-28 | Payer: Medicare Other | Source: Ambulatory Visit

## 2017-02-01 ENCOUNTER — Telehealth (HOSPITAL_COMMUNITY): Payer: Self-pay | Admitting: *Deleted

## 2017-02-02 ENCOUNTER — Other Ambulatory Visit (INDEPENDENT_AMBULATORY_CARE_PROVIDER_SITE_OTHER): Payer: Medicare Other

## 2017-02-02 ENCOUNTER — Ambulatory Visit (INDEPENDENT_AMBULATORY_CARE_PROVIDER_SITE_OTHER)
Admission: RE | Admit: 2017-02-02 | Discharge: 2017-02-02 | Disposition: A | Discharge status: Home / Self Care | Payer: Medicare Other | Source: Ambulatory Visit | Attending: Internal Medicine | Admitting: Internal Medicine

## 2017-02-02 ENCOUNTER — Encounter (HOSPITAL_COMMUNITY)
Admission: RE | Admit: 2017-02-02 | Discharge: 2017-02-02 | Disposition: A | Discharge status: Home / Self Care | Payer: Medicare Other | Source: Ambulatory Visit | Attending: Internal Medicine | Admitting: Internal Medicine

## 2017-02-02 ENCOUNTER — Encounter: Payer: Self-pay | Admitting: Internal Medicine

## 2017-02-02 ENCOUNTER — Ambulatory Visit (INDEPENDENT_AMBULATORY_CARE_PROVIDER_SITE_OTHER): Payer: Medicare Other | Admitting: Internal Medicine

## 2017-02-02 VITALS — Wt 131.8 lb

## 2017-02-02 VITALS — BP 118/70 | HR 96 | Ht 63.5 in | Wt 133.0 lb

## 2017-02-02 DIAGNOSIS — I10 Essential (primary) hypertension: Secondary | ICD-10-CM

## 2017-02-02 DIAGNOSIS — J9611 Chronic respiratory failure with hypoxia: Secondary | ICD-10-CM | POA: Diagnosis not present

## 2017-02-02 DIAGNOSIS — J8410 Pulmonary fibrosis, unspecified: Secondary | ICD-10-CM

## 2017-02-02 DIAGNOSIS — I2723 Pulmonary hypertension due to lung diseases and hypoxia: Secondary | ICD-10-CM

## 2017-02-02 DIAGNOSIS — J849 Interstitial pulmonary disease, unspecified: Secondary | ICD-10-CM | POA: Diagnosis not present

## 2017-02-02 LAB — PULMONARY FUNCTION TEST
DL/VA % pred: 46 %
DL/VA: 1.92 ml/min/mmHg/L
DLCO COR % PRED: 34 %
DLCO cor: 8.12 ml/min/mmHg
DLCO unc % pred: 35 %
DLCO unc: 8.38 ml/min/mmHg
FEF 25-75 Post: 2.32 L/sec
FEF 25-75 Pre: 2.98 L/sec
FEF2575-%Change-Post: -22 %
FEF2575-%PRED-POST: 131 %
FEF2575-%Pred-Pre: 169 %
FEV1-%Change-Post: -3 %
FEV1-%PRED-POST: 83 %
FEV1-%Pred-Pre: 87 %
FEV1-Post: 1.99 L
FEV1-Pre: 2.07 L
FEV1FVC-%CHANGE-POST: 0 %
FEV1FVC-%PRED-PRE: 121 %
FEV6-%Change-Post: -3 %
FEV6-%Pred-Post: 73 %
FEV6-%Pred-Pre: 76 %
FEV6-POST: 2.24 L
FEV6-Pre: 2.33 L
FEV6FVC-%PRED-POST: 107 %
FEV6FVC-%Pred-Pre: 107 %
FVC-%Change-Post: -4 %
FVC-%PRED-POST: 68 %
FVC-%PRED-PRE: 71 %
FVC-POST: 2.24 L
FVC-PRE: 2.34 L
PRE FEV1/FVC RATIO: 88 %
Post FEV1/FVC ratio: 89 %
Post FEV6/FVC ratio: 100 %
Pre FEV6/FVC Ratio: 100 %

## 2017-02-02 LAB — BASIC METABOLIC PANEL
BUN: 37 mg/dL — AB (ref 6–23)
CALCIUM: 10.6 mg/dL — AB (ref 8.4–10.5)
CHLORIDE: 103 meq/L (ref 96–112)
CO2: 24 meq/L (ref 19–32)
CREATININE: 1.65 mg/dL — AB (ref 0.40–1.50)
GFR: 43.73 mL/min — ABNORMAL LOW (ref >60.00)
GLUCOSE: 183 mg/dL — AB (ref 70–99)
Potassium: 5.1 mEq/L (ref 3.5–5.1)
Sodium: 139 mEq/L (ref 135–145)

## 2017-02-02 NOTE — Progress Notes (Signed)
PFT done today. 

## 2017-02-02 NOTE — Progress Notes (Signed)
Subjective:   Patient ID: Gilbert Dominguez, male    DOB: 1946/01/07   MRN: [id number]   Brief patient profile:  72  yowm quit smoking 1996 with dx of RA around 2010 followed by [facility] referred 11/23/2013 to pulmonary clinic for ? ILD with pfts c/w mod restriction  09/04/2014 unchanged from 09/2013     History of Present Illness  11/23/2013 1st [facility] office visit/ Gilbert Dominguez cc new doe x Jan 2014 and stopped all RA meds(mtx) in august of 2014 - onset was insidious and gradual to point where walk 50 ft esp last month assoc with dry cough but ok  control of arthritis ( vs historical control) and mild chronic nasal congestion s purulent secretions or sinus pain or epistaxis  rec Please see patient coordinator before you leave today  to schedule 02 24/7  2lpm at rest and 4lpm with activity  Prednisone 20 mg twice daily with meals  Pantoprazole (protonix) 40 mg   Take 30-60 min before first meal of the day and Pepcid 20 mg one bedtime until return to office - this is the best way to tell whether stomach acid is contributing to your problem.   GERD diet reviewed   03/08/2014 f/u ov/Gilbert Dominguez re: ILD / 02 dep at hs and ex not using 02 as rec  Chief Complaint  Patient presents with  . Follow-up    c/o nonprod cough after cold food/drinks.  No other complaints at this time.   can walk medium pace flat s 02 for 15-20 min and sats are in mid 80s  arthritis and breathing much better since rx with prednisone being tapered down by Dr Charlestine Night to 20 mg per day  rec No need for 02 at rest or room to room walking Please use 2lpm at hs and   2lpm with walking outside more than 200 ft  Please see patient coordinator before you leave today  to schedule a portable system for your daily walk   09/04/2014 f/u ov/Gilbert Dominguez re: ILD/ new cough  Chief Complaint  Patient presents with  . Follow-up    PFT done today. Breathing is unchanged. Pt c/o cough for the past 6 wks- "makes me have to clear throat".   just  using 02 at hs / Not limited by breathing from desired activities   Cough is new problem x 6 weeks day > noct min white mucus  On fosfamax  Weaned off pred effective early August  Started acutely with ? [name] feels like persistent pnds  rec I recommend against taking fosfamax as long as you are coughing as it may contribute to the cough  For drainage >>take chlortrimeton (chlorpheniramine) 4 mg every 4 hours available over the counter (may cause drowsiness, available over the counter) For cough>> Take delsym two tsp every 12 hours   to suppress the urge to cough. Swallowing water or using ice chips/non mint and non menthol containing candies (such as lifesavers or sugarless jolly ranchers) are also effective.         11/03/2016  f/u ov/Gilbert Dominguez re: PF / pred 10 mg x 3 weeks - has started rehab and req as much as 8lpm with exertion  Chief Complaint  Patient presents with  . Follow-up    Pt. states his breathing has remained unchanged, Still coughing, not producing much, still sob with exertion Denies chest pain or tightness  No change doe = MMRC2  rec Adjust your 02 to maintain your sats in upper 80 or  lower 90s     Admit date: 12/07/2016 Discharge date: 12/12/2016  DISCHARGE DIAGNOSES:  Principal Problem:   Acute on chronic respiratory failure [facility]) Active Problems:   Postinflammatory pulmonary fibrosis (HCC)   Cough   Essential hypertension   Fluid overload   Elevated troponin   Hyperlipidemia   Anemia  01/01/2017  f/u ov/Gilbert Dominguez re: post hosp/ transition of care  PF on 02  Chief Complaint  Patient presents with  . Hospitalization Follow-up    Breathing has improved since hospital d/c.     abruptly ill Feb 14th 2018  took a deep breath and panicked when couldn't breath or speak while back on fosfamax - note hx at the time was progressive sob x one week and was not admitted until 5 days p his "spell" with evidence of PH noted on admit but neg CTa for PE  rec Stop fosfamax and  discuss alternatives with Dr Charlestine Night  (IV yearly Reclast)  Continue Pantoprazole (protonix) 40 mg   Take  30-60 min before first meal of the day and Pepcid (famotidine)  20 mg one @  bedtime until return to office - this is the best way to tell whether stomach acid is contributing to your problem.         01/19/17 [place] eval for leg swelling rec We will check basic metabolic panel, Magnesium, phosphorus, BNP, d-dimer today Increase Lasix to 2 tabs (15m each) twice daily Increase potassium to 1 tablet twice daily      02/02/2017  f/u ov/Gilbert Dominguez re:  PF/ chronic resp failure/ PH  On Protonix 40 mg bid / predisone still 20 mg daily  Chief Complaint  Patient presents with  . Follow-up    PFT done today   swelling is better, breathing about the same since diuretics doubled, some cramping occasionally but tolerating well   No obvious day to day or daytime variability or assoc excess/ purulent sputum or mucus plugs or hemoptysis or cp or chest tightness, subjective wheeze or overt sinus or hb symptoms. No unusual exp hx or h/o childhood pna/ asthma or knowledge of premature birth.  Sleeping ok without nocturnal  or early am exacerbation  of respiratory  c/o's or need for noct saba. Also denies any obvious fluctuation of symptoms with weather or environmental changes or other aggravating or alleviating factors except as outlined above   Current Medications, Allergies, Complete Past Medical History, Past Surgical History, Family History, and Social History were reviewed in [employer].  ROS  The following are not active complaints unless bolded sore throat, dysphagia, dental problems, itching, sneezing,  nasal congestion or excess/ purulent secretions, ear ache,   fever, chills, sweats, unintended wt loss, classically pleuritic or exertional cp,  orthopnea pnd or leg swelling improved, presyncope, palpitations, abdominal pain, anorexia, nausea, vomiting, diarrhea  or  change in bowel or bladder habits, change in stools or urine, dysuria,hematuria,  rash, arthralgias, visual complaints, headache, numbness, weakness or ataxia or problems with walking or coordination,  change in mood/affect or memory.                   Objective:   Physical Exam  amb wm nad / vital signs reviewed -  - Note on arrival 02 sats  96% on 3lpm     09/04/2014      160 > 12/12/2014   162 > 04/11/2015   158  > 11/19/2015   142  > 12/31/2015 140 > 02/11/2016  137 >  04/20/2016  134 > 07/21/2016 134 >  11/03/2016   136  > 01/01/2017    135 > 02/02/2017  133    03/08/14 159 lb (72.122 kg)  12/08/13 154 lb (69.854 kg)  11/23/13 158 lb 3.2 oz (71.759 kg)           HEENT: nl dentition, turbinates, and oropharynx. Nl external ear canals without cough reflex   NECK :  without JVD/Nodes/TM/ nl carotid upstrokes bilaterally   LUNGS:    Bronchovesicular bs bilaterally no cough on insp    CV:  RRR  no s3 or murmur or increase in P2,  Trace  bilaterally sym ankle edema  - pos Raynaud's changes both hands   ABD:  soft and nontender with nl excursion in the supine position. No bruits or organomegaly, bowel sounds nl  MS:  warm without deformities, calf tenderness, cyanosis- mild to mod clubbng   SKIN: warm and dry without lesions    NEURO:  alert, approp, no deficits     CXR PA and Lateral:   02/02/2017 :    I personally reviewed images and agree with radiology impression as follows:    No acute cardiopulmonary disease. 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  Labs ordered/ reviewed:      Chemistry      Component Value Date/Time   NA 139 02/02/2017 1215   K 5.1 02/02/2017 1215   CL 103 02/02/2017 1215   CO2 24 02/02/2017 1215   BUN 37 (H) 02/02/2017 1215   CREATININE 1.65 (H) 02/02/2017 1215      Component Value Date/Time   CALCIUM 10.6 (H) 02/02/2017 1215   ALKPHOS 70 12/08/2016 1036   AST 74 (H) 12/08/2016 1036   ALT 49 12/08/2016 1036    BILITOT 1.8 (H) 12/08/2016 1036               Lab Results  Component Value Date   ESRSEDRATE 48 (H) 12/31/2015   ESRSEDRATE 14 12/08/2013   ESRSEDRATE 95 (H) 11/23/2013                       Assessment & Plan:

## 2017-02-02 NOTE — Progress Notes (Signed)
Daily Session Note  Patient Details  Name: Gilbert Dominguez MRN: [id number] Date of Birth: Mar 29, 1946 Referring Provider:     Pulmonary Rehab Walk Test from 08/11/2016 in [place]  Referring Provider  Dr. Melvyn Novas      Encounter Date: 02/02/2017  Check In:     Session Check In - 02/02/17 1416      Check-In   Location [facility]   Staff Present Su Hilt, MS, ACSM RCEP, Exercise Physiologist;Lisa Ysidro Evert, RN;Portia Rollene Rotunda, RN, BSN   Supervising physician immediately available to respond to emergencies [facility] immediately available   Physician(s) Dr. Cathlean Sauer   Medication changes reported     No   Fall or balance concerns reported    No   Tobacco Cessation No Change   Warm-up and Cool-down Performed as group-led instruction   Resistance Training Performed Yes   VAD Patient? No     Pain Assessment   Currently in Pain? No/denies   Multiple Pain Sites No      Capillary Blood Glucose: Results for orders placed or performed in visit on 02/02/17 (from the past 24 hour(s))  Basic metabolic panel     Status: Abnormal   Collection Time: 02/02/17 12:15 PM  Result Value Ref Range   Sodium 139 135 - 145 mEq/L   Potassium 5.1 3.5 - 5.1 mEq/L   Chloride 103 96 - 112 mEq/L   CO2 24 19 - 32 mEq/L   Glucose, Bld 183 (H) 70 - 99 mg/dL   BUN 37 (H) 6 - 23 mg/dL   Creatinine, Ser 1.65 (H) 0.40 - 1.50 mg/dL   Calcium 10.6 (H) 8.4 - 10.5 mg/dL   GFR 43.73 (L) >60.00 mL/min        Exercise Prescription Changes - 02/02/17 1500      Response to Exercise   Blood Pressure (Admit) 106/78   Blood Pressure (Exercise) 100/70   Blood Pressure (Exit) 110/60   Heart Rate (Admit) 84 bpm   Heart Rate (Exercise) 98 bpm   Heart Rate (Exit) 77 bpm   Oxygen Saturation (Admit) 96 %   Oxygen Saturation (Exercise) 93 %   Oxygen Saturation (Exit) 98 %   Rating of Perceived Exertion (Exercise) 13   Perceived Dyspnea (Exercise) 3   Duration Progress to 45 minutes of aerobic exercise without signs/symptoms of physical distress   Intensity THRR unchanged     Progression   Progression Continue to progress workloads to maintain intensity without signs/symptoms of physical distress.     Resistance Training   Training Prescription Yes   Weight green bands   Reps 10-15   Time 10 Minutes     Interval Training   Interval Training No     Oxygen   Oxygen Continuous   Liters 6-15     Recumbant Bike   Level 5   Minutes 17     NuStep   Level 3   Minutes 17   METs 1.8     Track   Laps 7   Minutes 17      History  Smoking Status  . Former Smoker  . Packs/day: 1.00  . Years: 36.00  . Types: Cigarettes  . Quit date: 10/14/1995  Smokeless Tobacco  . Never Used    Goals Met:  Exercise tolerated well No report of cardiac concerns or symptoms Strength training completed today  Goals Unmet:  Not Applicable  Comments: Service time is from 1:30p to 3:00p  Dr. Rush Farmer is Medical Director for Pulmonary Rehab at [facility].

## 2017-02-02 NOTE — Progress Notes (Signed)
LMTCB

## 2017-02-02 NOTE — Patient Instructions (Addendum)
Please remember to go to the lab and  x-ray department downstairs in the basement  for your tests - we will call you with the results when they are available.   Please schedule a follow up visit in 3 months but call sooner if needed  Late add:  D/c Kdur, return for med calendar and recheck K and refer to [facility] for [facility] on return

## 2017-02-02 NOTE — Progress Notes (Signed)
LMTCB

## 2017-02-03 ENCOUNTER — Telehealth: Payer: Self-pay | Admitting: *Deleted

## 2017-02-03 NOTE — Telephone Encounter (Signed)
-----  Message from Tanda Rockers, MD sent at 02/03/2017  5:52 AM EDT ----- After further review, rec  Decrease kdur by one dose (I believe he's on bid) See Tammy NP w/in 2 weeks with all your medications, even over the counter meds, separated in two separate bags, the ones you take no matter what vs the ones you stop once you feel better and take only as needed when you feel you need them.   Tammy  will generate for you a new user friendly medication calendar that will put [place] all on the same page re: your medication use.

## 2017-02-03 NOTE — Progress Notes (Signed)
LMTCB

## 2017-02-03 NOTE — Assessment & Plan Note (Signed)
Need to decrease Kdur to one daily with K now 5.1

## 2017-02-03 NOTE — Assessment & Plan Note (Addendum)
Echo 12/08/16  size with EF 55-60%. D-shaped interventricular septum   suggestive of RV pressure/volume overload. 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 Severe pulmonary   hypertension. Mild aortic stenosis. - CTa 12/08/16 neg PE   For now has responded nicely to diuresis >  will discuss with Dr Lake Bells whether additional meds directed at [facility] can be used in this setting or ? Needs [facility] first

## 2017-02-03 NOTE — Assessment & Plan Note (Addendum)
-  pfts 09/27/14        VC 2.2(59%) no obst with dlco 38 corrects to 63 - PFTs 09/04/2014 VC 2.3 (62%) no obst with dlco 38 corrects to 83%  - RA sats 62% 11/23/2013 at rest > started 24h 02 (see chronic respiratory failure)  - 12/12/2014  Walked RA  2 laps @ 185 ft each stopped due to  desat to 84% nl pace  - 11/23/2013 ESR 95 rx pred 20 mg bid  >  12/08/2013 = 14 > rx per [place]  - PFTs 04/11/2015    VC 2.1 s obst and dlco 41 corrects to 75% - ESR 11/19/2015 = 14  - ESR 12/31/2015 =  48 and worse sob/cough > try prednisone 20 mg daily until better and then 10 mg maintenance until follow-up office visit. 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  For now therefore I recommend the patient maintain  Ceiling of 20 mg a day and floor of 10 mg daily   I had an extended discussion with the patient reviewing all relevant studies completed to date and  lasting 15 to 20 minutes of a 25 minute visit    Each maintenance medication was reviewed in detail including most importantly the difference between maintenance and prns and under what circumstances the prns are to be triggered using an action plan format that is not reflected in the computer generated  alphabetically organized AVS.    Please see AVS for specific instructions unique to this visit that I personally wrote and verbalized to the the pt in detail and then reviewed with pt  by my nurse highlighting any  changes in therapy recommended at today's visit to their plan of care.

## 2017-02-03 NOTE — Telephone Encounter (Signed)
LMTCB

## 2017-02-03 NOTE — Assessment & Plan Note (Signed)
-  RA sats 62% 11/23/2013  - 11/23/2013   Walked 4lpm  x one lap @ 185 stopped due to desat to 73%  - 12/08/2013  Rest 94% RA,  Walked 4lpm x 3 laps @ 185 ft each stopped due to  End of study, sats still 94%  - 03/08/2014  Walked RA  2 laps @ 185 ft each stopped due to sats 86 corrected on 2lpm   - 12/12/2014  Walked RA x 2 laps @ 185 ft each stopped due to  sats 84 nl pace  - 04/11/2015  Walked RA x 2 laps @ 185 ft each stopped due to sats 83%nl pace  - 11/19/2015   Walked RA x one lap @ 185 stopped due to  83% nl pace/ resolved on 2lpm     rec as of  02/03/2017 >>>  3lpm baseline and up to 8lpm with exertion

## 2017-02-04 ENCOUNTER — Telehealth: Payer: Self-pay | Admitting: Internal Medicine

## 2017-02-04 ENCOUNTER — Encounter (HOSPITAL_COMMUNITY)
Admission: RE | Admit: 2017-02-04 | Discharge: 2017-02-04 | Disposition: A | Discharge status: Home / Self Care | Payer: Medicare Other | Source: Ambulatory Visit | Attending: Internal Medicine | Admitting: Internal Medicine

## 2017-02-04 VITALS — Wt 131.4 lb

## 2017-02-04 DIAGNOSIS — J9611 Chronic respiratory failure with hypoxia: Secondary | ICD-10-CM | POA: Diagnosis not present

## 2017-02-04 DIAGNOSIS — J8410 Pulmonary fibrosis, unspecified: Secondary | ICD-10-CM

## 2017-02-04 NOTE — Telephone Encounter (Signed)
Spoke with patient in lobby, given results of cxr and recs below. Pt taken to front desk to schedule 2 week ROV with TP for med calendar. Nothing further needed.  Rosana Berger, [place]  02/03/17 10:01 AM  Note    ----- Message from Tanda Rockers, MD sent at 02/03/2017  5:52 AM EDT ----- After further review, rec  Decrease kdur by one dose (I believe he's on bid) See Tammy NP w/in 2 weeks with all your medications, even over the counter meds, separated in two separate bags, the ones you take no matter what vs the ones you stop once you feel better and take only as needed when you feel you need them.   Tammy  will generate for you a new user friendly medication calendar that will put [place] all on the same page re: your medication use.

## 2017-02-04 NOTE — Progress Notes (Signed)
Daily Session Note  Patient Details  Name: Gilbert Dominguez MRN: [id number] Date of Birth: [birth year] Referring Provider:     Pulmonary Rehab Walk Test from 08/11/2016 in [place]  Referring Provider  Dr. Melvyn Novas      Encounter Date: 02/04/2017  Check In:     Session Check In - 02/04/17 1330      Check-In   Location [facility]   Staff Present Rosebud Poles, RN, BSN;Molly diVincenzo, MS, ACSM RCEP, Exercise Physiologist;Brayden Betters Rollene Rotunda, RN, BSN   Supervising physician immediately available to respond to emergencies [facility] immediately available   Physician(s) Dr. Sloan Leiter   Medication changes reported     No   Fall or balance concerns reported    No   Tobacco Cessation No Change   Warm-up and Cool-down Performed as group-led instruction   Resistance Training Performed Yes   VAD Patient? No     Pain Assessment   Currently in Pain? No/denies   Multiple Pain Sites No      Capillary Blood Glucose: No results found for this or any previous visit (from the past 24 hour(s)).      Exercise Prescription Changes - 02/04/17 1612      Response to Exercise   Blood Pressure (Admit) 98/64   Blood Pressure (Exercise) 136/70   Blood Pressure (Exit) 104/60   Heart Rate (Admit) 84 bpm   Heart Rate (Exercise) 92 bpm   Heart Rate (Exit) 51 bpm   Oxygen Saturation (Admit) 97 %   Oxygen Saturation (Exercise) 91 %   Oxygen Saturation (Exit) 100 %   Rating of Perceived Exertion (Exercise) 13   Perceived Dyspnea (Exercise) 3   Duration Progress to 45 minutes of aerobic exercise without signs/symptoms of physical distress   Intensity THRR unchanged     Progression   Progression Continue to progress workloads to maintain intensity without signs/symptoms of physical distress.     Resistance Training   Training Prescription Yes   Weight green bands   Reps 10-15   Time 10 Minutes     Interval Training   Interval Training No     Oxygen   Oxygen Continuous   Liters 8     NuStep   Level 3   Minutes 17   METs 1.8     Track   Laps 7   Minutes 17      History  Smoking Status  . Former Smoker  . Packs/day: 1.00  . Years: 36.00  . Types: Cigarettes  . Quit date: 10/14/1995  Smokeless Tobacco  . Never Used    Goals Met:  Exercise tolerated well Queuing for purse lip breathing No report of cardiac concerns or symptoms Strength training completed today  Goals Unmet:  Not Applicable  Comments: Service time is from 1330 to 1530   Dr. Rush Farmer is Medical Director for Pulmonary Rehab at [facility].

## 2017-02-05 NOTE — Telephone Encounter (Signed)
See other phone note dated 02/04/17

## 2017-02-09 ENCOUNTER — Encounter (HOSPITAL_COMMUNITY): Payer: Medicare Other

## 2017-02-11 ENCOUNTER — Encounter (HOSPITAL_COMMUNITY)
Admission: RE | Admit: 2017-02-11 | Discharge: 2017-02-11 | Disposition: A | Discharge status: Home / Self Care | Payer: Medicare Other | Source: Ambulatory Visit | Attending: Internal Medicine | Admitting: Internal Medicine

## 2017-02-11 VITALS — Wt 131.6 lb

## 2017-02-11 DIAGNOSIS — J9611 Chronic respiratory failure with hypoxia: Secondary | ICD-10-CM | POA: Diagnosis not present

## 2017-02-11 DIAGNOSIS — J8410 Pulmonary fibrosis, unspecified: Secondary | ICD-10-CM

## 2017-02-11 NOTE — Progress Notes (Signed)
Daily Session Note  Patient Details  Name: Gilbert Dominguez MRN: [id number] Date of Birth: 05/27/1946 Referring Provider:     Pulmonary Rehab Walk Test from 08/11/2016 in [place]  Referring Provider  Dr. Melvyn Novas      Encounter Date: 02/11/2017  Check In:     Session Check In - 02/11/17 1330      Check-In   Location [facility]   Staff Present Rosebud Poles, RN, BSN;Molly diVincenzo, MS, ACSM RCEP, Exercise Physiologist;Lisa Ysidro Evert, RN;Lonette Stevison Rollene Rotunda, RN, BSN   Supervising physician immediately available to respond to emergencies [facility] immediately available   Physician(s) Dr. Posey Pronto   Medication changes reported     No   Fall or balance concerns reported    No   Tobacco Cessation No Change   Warm-up and Cool-down Performed as group-led instruction   Resistance Training Performed Yes   VAD Patient? No     Pain Assessment   Currently in Pain? No/denies   Multiple Pain Sites No      Capillary Blood Glucose: No results found for this or any previous visit (from the past 24 hour(s)).      Exercise Prescription Changes - 02/11/17 1619      Response to Exercise   Blood Pressure (Admit) 110/70   Blood Pressure (Exercise) 120/66   Heart Rate (Admit) 86 bpm   Heart Rate (Exercise) 97 bpm   Heart Rate (Exit) 71 bpm   Oxygen Saturation (Admit) 96 %   Oxygen Saturation (Exercise) 85 %   Oxygen Saturation (Exit) 96 %   Rating of Perceived Exertion (Exercise) 13   Perceived Dyspnea (Exercise) 3   Duration Progress to 45 minutes of aerobic exercise without signs/symptoms of physical distress   Intensity THRR unchanged     Progression   Progression Continue to progress workloads to maintain intensity without signs/symptoms of physical distress.     Resistance Training   Training Prescription Yes   Weight green bands   Reps 10-15   Time 10 Minutes     Interval Training   Interval Training No     Oxygen   Oxygen  Continuous   Liters 6-8     Recumbant Bike   Level 4   Minutes 35     Track   Minutes 17      History  Smoking Status  . Former Smoker  . Packs/day: 1.00  . Years: 36.00  . Types: Cigarettes  . Quit date: 10/14/1995  Smokeless Tobacco  . Never Used    Goals Met:  Improved SOB with ADL's Using PLB without cueing & demonstrates good technique Exercise tolerated well No report of cardiac concerns or symptoms Strength training completed today  Goals Unmet:  Not Applicable  Comments: Service time is from 1330 to 1515   Dr. Rush Farmer is Medical Director for Pulmonary Rehab at [facility].

## 2017-02-16 ENCOUNTER — Encounter (HOSPITAL_COMMUNITY)
Admission: RE | Admit: 2017-02-16 | Discharge: 2017-02-16 | Disposition: A | Discharge status: Home / Self Care | Payer: Medicare Other | Source: Ambulatory Visit | Attending: Internal Medicine | Admitting: Internal Medicine

## 2017-02-16 VITALS — Wt 135.8 lb

## 2017-02-16 DIAGNOSIS — J8410 Pulmonary fibrosis, unspecified: Secondary | ICD-10-CM | POA: Insufficient documentation

## 2017-02-16 DIAGNOSIS — R0600 Dyspnea, unspecified: Secondary | ICD-10-CM | POA: Insufficient documentation

## 2017-02-16 DIAGNOSIS — J9611 Chronic respiratory failure with hypoxia: Secondary | ICD-10-CM | POA: Diagnosis present

## 2017-02-16 NOTE — Progress Notes (Signed)
Daily Session Note  Patient Details  Name: Gilbert Dominguez MRN: [id number] Date of Birth: 21-Mar-1946 Referring Provider:     Pulmonary Rehab Walk Test from 08/11/2016 in [place]  Referring Provider  Dr. Melvyn Novas      Encounter Date: 02/16/2017  Check In:     Session Check In - 02/16/17 1507      Check-In   Location [facility]   Staff Present Trish Fountain, RN, BSN;Molly diVincenzo, MS, ACSM RCEP, Exercise Physiologist;Joan Leonia Reeves, RN, BSN   Supervising physician immediately available to respond to emergencies [facility] immediately available   Physician(s) Dr. Posey Pronto   Medication changes reported     No   Fall or balance concerns reported    No   Tobacco Cessation No Change   Warm-up and Cool-down Performed as group-led instruction   Resistance Training Performed Yes   VAD Patient? No     Pain Assessment   Currently in Pain? No/denies   Multiple Pain Sites No      Capillary Blood Glucose: No results found for this or any previous visit (from the past 24 hour(s)).      Exercise Prescription Changes - 02/16/17 1500      Response to Exercise   Blood Pressure (Admit) 112/66   Blood Pressure (Exercise) 120/60   Blood Pressure (Exit) 106/70   Heart Rate (Admit) 90 bpm   Heart Rate (Exercise) 99 bpm   Heart Rate (Exit) 70 bpm   Oxygen Saturation (Admit) 90 %   Oxygen Saturation (Exercise) 88 %   Oxygen Saturation (Exit) 100 %   Rating of Perceived Exertion (Exercise) 13   Perceived Dyspnea (Exercise) 2   Duration Progress to 45 minutes of aerobic exercise without signs/symptoms of physical distress   Intensity THRR unchanged     Progression   Progression Continue to progress workloads to maintain intensity without signs/symptoms of physical distress.     Resistance Training   Training Prescription Yes   Weight green bands   Reps 10-15   Time 10 Minutes     Interval Training   Interval Training No     Oxygen   Oxygen Continuous   Liters 6-8     Recumbant Bike   Level 4   Minutes 17     NuStep   Level 4   Minutes 17   METs 1.6     Track   Laps 4   Minutes 17      History  Smoking Status  . Former Smoker  . Packs/day: 1.00  . Years: 36.00  . Types: Cigarettes  . Quit date: 10/14/1995  Smokeless Tobacco  . Never Used    Goals Met:  Exercise tolerated well No report of cardiac concerns or symptoms Strength training completed today  Goals Unmet:  Not Applicable  Comments: Service time is from 1330 to 1500    Dr. Rush Farmer is Medical Director for Pulmonary Rehab at [facility].

## 2017-02-18 ENCOUNTER — Encounter (HOSPITAL_COMMUNITY)
Admission: RE | Admit: 2017-02-18 | Discharge: 2017-02-18 | Disposition: A | Discharge status: Home / Self Care | Payer: Medicare Other | Source: Ambulatory Visit | Attending: Internal Medicine | Admitting: Internal Medicine

## 2017-02-18 VITALS — Wt 134.5 lb

## 2017-02-18 DIAGNOSIS — J9611 Chronic respiratory failure with hypoxia: Secondary | ICD-10-CM | POA: Diagnosis not present

## 2017-02-18 DIAGNOSIS — J8410 Pulmonary fibrosis, unspecified: Secondary | ICD-10-CM

## 2017-02-18 NOTE — Progress Notes (Signed)
Daily Session Note  Patient Details  Name: Gilbert Dominguez MRN: [id number] Date of Birth: Feb 22, 1946 Referring Provider:     Pulmonary Rehab Walk Test from 08/11/2016 in [place]  Referring Provider  Dr. Melvyn Novas      Encounter Date: 02/18/2017  Check In:     Session Check In - 02/18/17 1553      Check-In   Location [facility]   Staff Present Trish Fountain, RN, Maxcine Ham, RN, Roque Cash, RN   Supervising physician immediately available to respond to emergencies [facility] immediately available   Physician(s) Dr. Sloan Leiter   Medication changes reported     No   Fall or balance concerns reported    No   Tobacco Cessation No Change   Warm-up and Cool-down Performed as group-led instruction   Resistance Training Performed Yes   VAD Patient? No     Pain Assessment   Currently in Pain? No/denies   Multiple Pain Sites No      Capillary Blood Glucose: No results found for this or any previous visit (from the past 24 hour(s)).      Exercise Prescription Changes - 02/18/17 1500      Response to Exercise   Blood Pressure (Admit) 96/50   Blood Pressure (Exercise) 110/70   Blood Pressure (Exit) 110/60   Heart Rate (Admit) 92 bpm   Heart Rate (Exercise) 100 bpm   Heart Rate (Exit) 86 bpm   Oxygen Saturation (Admit) 98 %   Oxygen Saturation (Exercise) 94 %   Oxygen Saturation (Exit) 98 %   Rating of Perceived Exertion (Exercise) 13   Perceived Dyspnea (Exercise) 2   Duration Progress to 45 minutes of aerobic exercise without signs/symptoms of physical distress   Intensity THRR unchanged     Progression   Progression Continue to progress workloads to maintain intensity without signs/symptoms of physical distress.     Resistance Training   Training Prescription Yes   Weight green bands   Reps 10-15   Time 10 Minutes     Interval Training   Interval Training No     Oxygen   Oxygen Continuous   Liters 8      NuStep   Level 4   Minutes 17   METs 1.8     Track   Laps 5   Minutes 17      History  Smoking Status  . Former Smoker  . Packs/day: 1.00  . Years: 36.00  . Types: Cigarettes  . Quit date: 10/14/1995  Smokeless Tobacco  . Never Used    Goals Met:  Exercise tolerated well No report of cardiac concerns or symptoms Strength training completed today  Goals Unmet:  Not Applicable  Comments: Service time is from 1330 to 1530    Dr. Rush Farmer is Medical Director for Pulmonary Rehab at [facility].

## 2017-02-23 ENCOUNTER — Inpatient Hospital Stay (HOSPITAL_COMMUNITY)
Admission: RE | Admit: 2017-02-23 | Discharge: 2017-02-23 | Disposition: A | Discharge status: Home / Self Care | Payer: Medicare Other | Source: Ambulatory Visit

## 2017-02-23 ENCOUNTER — Encounter: Payer: Self-pay | Admitting: Adult Health

## 2017-02-23 ENCOUNTER — Other Ambulatory Visit (INDEPENDENT_AMBULATORY_CARE_PROVIDER_SITE_OTHER): Payer: Medicare Other

## 2017-02-23 ENCOUNTER — Ambulatory Visit (INDEPENDENT_AMBULATORY_CARE_PROVIDER_SITE_OTHER): Payer: Medicare Other | Admitting: Adult Health

## 2017-02-23 VITALS — BP 94/60 | HR 89 | Ht 63.5 in | Wt 133.6 lb

## 2017-02-23 DIAGNOSIS — J9611 Chronic respiratory failure with hypoxia: Secondary | ICD-10-CM

## 2017-02-23 DIAGNOSIS — J849 Interstitial pulmonary disease, unspecified: Secondary | ICD-10-CM | POA: Diagnosis not present

## 2017-02-23 DIAGNOSIS — J8410 Pulmonary fibrosis, unspecified: Secondary | ICD-10-CM

## 2017-02-23 DIAGNOSIS — I2723 Pulmonary hypertension due to lung diseases and hypoxia: Secondary | ICD-10-CM | POA: Diagnosis not present

## 2017-02-23 LAB — BASIC METABOLIC PANEL
BUN: 35 mg/dL — AB (ref 6–23)
CO2: 26 mEq/L (ref 19–32)
Calcium: 10 mg/dL (ref 8.4–10.5)
Chloride: 97 mEq/L (ref 96–112)
Creatinine, Ser: 1.51 mg/dL — ABNORMAL HIGH (ref 0.40–1.50)
GFR: 48.43 mL/min — AB (ref >60.00)
Glucose, Bld: 240 mg/dL — ABNORMAL HIGH (ref 70–99)
POTASSIUM: 4.3 meq/L (ref 3.5–5.1)
SODIUM: 134 meq/L — AB (ref 135–145)

## 2017-02-23 NOTE — Patient Instructions (Signed)
Labs today  Follow med calendar closely and bring to each visit .  Refer to Dr. Lake Bells for Pulmonary Hypertension consult  follow up Dr. Melvyn Novas  In 3 months and As needed   Please contact office for sooner follow up if symptoms do not improve or worsen or seek emergency care

## 2017-02-23 NOTE — Assessment & Plan Note (Signed)
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 Agent orange [place]   Plan  Cont on prednisone 93m daily.  Cont w/ Rheum follow up

## 2017-02-23 NOTE — Progress Notes (Signed)
Pulmonary Individual Treatment Plan  Patient Details  Name: Gilbert Dominguez MRN: [id number] Date of Birth: Feb 11, 1946 Referring Provider:     Pulmonary Rehab Walk Test from 08/11/2016 in [facility]  Referring Provider  Dr. Melvyn Novas      Initial Encounter Date:    Pulmonary Rehab Walk Test from 08/11/2016 in [place]  Date  08/11/16  Referring Provider  Dr. 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  aspirin 81 MG tablet, Take 81 mg by mouth daily., Disp: , Rfl:  .  atorvastatin (LIPITOR) 20 MG tablet, Take 20 mg by mouth daily., Disp: , Rfl:  .  Calcium Carbonate-Vit D-Min (CALCIUM 600+D PLUS MINERALS PO), Take 1 tablet by mouth 2 (two) times daily., Disp: , Rfl:  .  famotidine (PEPCID) 20 MG tablet, Take 1 tablet (20 mg total) by mouth at bedtime., Disp: 90 tablet, Rfl: 1 .  furosemide (LASIX) 20 MG tablet, Take 2 tablets (40 mg total) by mouth daily., Disp: 60 tablet, Rfl: 0 .  loratadine (CLARITIN) 10 MG tablet, Take 10 mg by mouth daily., Disp: , Rfl:  .  OXYGEN, 4lpm 24/7 and titrate to keep sats above 90 with exertion  [employer], Disp: , Rfl:  .  pantoprazole (PROTONIX) 40 MG tablet, TAKE 1 TABLET BY MOUTH EVERY DAY *TAKE 30-60 MINUTES BEFORE FIRST MEAL OF THE DAY*, Disp: 30 tablet, Rfl: 11 .  potassium chloride SA (K-DUR,KLOR-CON) 20 MEQ tablet, Take 1 tablet (20 mEq total) by mouth daily., Disp: 30 tablet, Rfl: 0 .  predniSONE (DELTASONE) 10 MG tablet, TAKE 2 DAILY UNTIL BETTER THEN ONE DAILY UNTIL RETURN, Disp: 60 tablet, Rfl: 2 .  Pseudoephedrine-Guaifenesin (MUCINEX D MAX STRENGTH) [id number] MG TB12, Take 1 tablet by mouth 2 (two) times daily. , Disp: , Rfl:  .  terazosin (HYTRIN) 1 MG capsule, Take 2 capsules (2 mg total) by mouth at bedtime., Disp: 60 capsule, Rfl: 0  Past Medical History: Past Medical  History:  Diagnosis Date  . Chronic respiratory failure ([place])   . Hypertension   . 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 Rheumatoid arthritis(714.0)     Tobacco Use: History  Smoking Status  . Former Smoker  . Packs/day: 1.00  . Years: 36.00  . Types: Cigarettes  . Quit date: 10/14/1995  Smokeless Tobacco  . Never Used    Labs: Recent Review Flowsheet Data    There is no flowsheet data to display.      Capillary Blood Glucose: No results found for: GLUCAP   ADL UCSD:   Pulmonary Function Assessment:   Exercise Target Goals:    Exercise Program Goal: Individual exercise prescription set with THRR, safety & activity barriers. Participant demonstrates ability to understand and report RPE using BORG scale, to self-measure pulse accurately, and to acknowledge the importance of the exercise prescription.  Exercise Prescription Goal: Starting with aerobic activity 30 plus minutes a day, 3 days per week for initial exercise prescription. Provide home exercise prescription and guidelines that participant acknowledges understanding prior to discharge.  Activity Barriers & Risk Stratification:   6 Minute Walk:   Oxygen Initial Assessment:   Oxygen Re-Evaluation:     Oxygen Re-Evaluation    Row Name 01/25/17 1436 02/22/17 1148           Program Oxygen Prescription   Program Oxygen Prescription Continuous Continuous  Liters per minute  - 10        Home Oxygen   Home Oxygen Device Home Concentrator;E-Tanks Home Concentrator;E-Tanks      Sleep Oxygen Prescription Continuous Continuous      Liters per minute 2 2      Home Exercise Oxygen Prescription Continuous Continuous      Liters per minute 10 10      Home at Rest Exercise Oxygen Prescription Continuous Continuous      Liters per minute 2 2      Compliance with Home Oxygen Use Yes Yes        Goals/Expected Outcomes   Short Term Goals To learn and exhibit compliance with exercise, home and  travel O2 prescription -  compliance with oxygen      Long  Term Goals Exhibits compliance with exercise, home and travel O2 prescription Exhibits compliance with exercise, home and travel O2 prescription      Comments oxygen needs keep increasing with a component of pulmonary hypertension  -      Goals/Expected Outcomes  - ready to discharge soon, good oxygen compliance         Oxygen Discharge (Final Oxygen Re-Evaluation):     Oxygen Re-Evaluation - 02/22/17 1148      Program Oxygen Prescription   Program Oxygen Prescription Continuous   Liters per minute 10     Home Oxygen   Home Oxygen Device Home Concentrator;E-Tanks   Sleep Oxygen Prescription Continuous   Liters per minute 2   Home Exercise Oxygen Prescription Continuous   Liters per minute 10   Home at Rest Exercise Oxygen Prescription Continuous   Liters per minute 2   Compliance with Home Oxygen Use Yes     Goals/Expected Outcomes   Short Term Goals --  compliance with oxygen   Long  Term Goals Exhibits compliance with exercise, home and travel O2 prescription   Goals/Expected Outcomes ready to discharge soon, good oxygen compliance      Initial Exercise Prescription:   Perform Capillary Blood Glucose checks as needed.  Exercise Prescription Changes:     Exercise Prescription Changes    Row Name 08/27/16 1600 09/01/16 1500 09/03/16 1500 09/08/16 1552 09/15/16 1600     Response to Exercise   Blood Pressure (Admit) 90/40 100/50 1_0   Blood Pressure (Exercise) 1_1 99/69 100/50   Blood Pressure (Exit) 104/62 100/60 98/64 100/60 100/60   Heart Rate (Admit) 84 bpm 89 bpm 70 bpm 85 bpm 93 bpm   Heart Rate (Exercise) 102 bpm 97 bpm 72 bpm 91 bpm 97 bpm   Heart Rate (Exit) 70 bpm 75 bpm 61 bpm 77 bpm 73 bpm   Oxygen Saturation (Admit) 91 % 98 % 98 % 91 % 92 %   Oxygen Saturation (Exercise) 90 % 90 % 94 % 88 % 91 %   Oxygen Saturation (Exit) 100 % 95 % 94 % 100 % 99 %   Rating  of Perceived Exertion (Exercise) _2 Perceived Dyspnea (Exercise) _3 Duration Progress to 45 minutes of aerobic exercise without signs/symptoms of physical distress Progress to 45 minutes of aerobic exercise without signs/symptoms of physical distress Progress to 45 minutes of aerobic exercise without signs/symptoms of physical distress Progress to 45 minutes of aerobic exercise without signs/symptoms of physical distress Progress to 45 minutes of aerobic exercise without signs/symptoms of physical distress   Intensity THRR unchanged THRR  unchanged THRR unchanged THRR unchanged THRR unchanged     Progression   Progression Continue to progress workloads to maintain intensity without signs/symptoms of physical distress. Continue to progress workloads to maintain intensity without signs/symptoms of physical distress. Continue to progress workloads to maintain intensity without signs/symptoms of physical distress. Continue to progress workloads to maintain intensity without signs/symptoms of physical distress. Continue to progress workloads to maintain intensity without signs/symptoms of physical distress.     Resistance Training   Training Prescription _0    Weight _1    Reps 10-12  10 minutes of strength training 10-12  10 minutes of strength training 10-12  10 minutes of strength training 10-12  10 minutes of strength training 10-12  10 minutes of strength training     Interval Training   Interval Training _2      Oxygen   Oxygen _3    Liters _4 Recumbant Bike   Level  - _5 Minutes  - _6 NuStep   Level  - _7 Minutes  - _8 METs  - 2.3 1.8 2.2 2.4     Track   Laps 21 2  - 7 7   Minutes 34 17  - 17 17     Exercise Review   Progression  - Yes  - Yes  -   Row Name 09/17/16 1600  09/22/16 1500 09/24/16 1548 10/06/16 1600 10/08/16 1605     Response to Exercise   Blood Pressure (Admit) 1_9 96/50 94/50   Blood Pressure (Exercise) 100/68 94/50 109/70 96/50 102/50   Blood Pressure (Exit) _10 98/58 96/64   Heart Rate (Admit) 72 bpm 80 bpm 76 bpm 76 bpm 75 bpm   Heart Rate (Exercise) 90 bpm 90 bpm 82 bpm 106 bpm 97 bpm   Heart Rate (Exit) 69 bpm 89 bpm 88 bpm 64 bpm 70 bpm   Oxygen Saturation (Admit) 99 % 89 % 92 % 93 % 97 %   Oxygen Saturation (Exercise) 87 % 90 % 92 % 87 % 84 %  increased 90   Oxygen Saturation (Exit) 96 % 100 % 95 % 94 % 91 %   Rating of Perceived Exertion (Exercise) _11 Perceived Dyspnea (Exercise) _12 Duration Progress to 45 minutes of aerobic exercise without signs/symptoms of physical distress Progress to 45 minutes of aerobic exercise without signs/symptoms of physical distress Progress to 45 minutes of aerobic exercise without signs/symptoms of physical distress Progress to 45 minutes of aerobic exercise without signs/symptoms of physical distress Progress to 45 minutes of aerobic exercise without signs/symptoms of physical distress   Intensity _13      Progression   Progression Continue to progress workloads to maintain intensity without signs/symptoms of physical distress. Continue to progress workloads to maintain intensity without signs/symptoms of physical distress. Continue to progress workloads to maintain intensity without signs/symptoms of physical distress. Continue to progress workloads to maintain intensity without signs/symptoms of physical distress. Continue to progress workloads to maintain intensity without signs/symptoms of physical distress.     Resistance Training   Training Prescription _14   Weight _0    Reps 10-12  10 minutes of strength  training 10-12  10 minutes of strength training 10-12  10 minutes of strength training 10-12  10 minutes of strength training 10-12  10 minutes of strength training     Interval Training   Interval Training _1      Oxygen   Oxygen _2    Liters _3 Recumbant Bike   Level  - 4  - 4 4.5   Minutes  - 17  - 17 17     NuStep   Level _4 Minutes _5 METs  - 2.4 2.6 2.6 2.9     Track   Laps _6 -   Minutes _7 -     Exercise Review   Progression  -  -  - Yes  -   Row Name 10/20/16 1500 10/29/16 1500 11/03/16 1500 11/10/16 1500 11/12/16 1532     Response to Exercise   Blood Pressure (Admit) 90/50 1_8 96/61   Blood Pressure (Exercise) 94/52 100/70 136/70 100/70 96/60   Blood Pressure (Exit) 106/60 1_9 96/70   Heart Rate (Admit) 91 bpm 90 bpm 82 bpm 84 bpm 75 bpm   Heart Rate (Exercise) 102 bpm 106 bpm 98 bpm 107 bpm 97 bpm   Heart Rate (Exit) 72 bpm 68 bpm 73 bpm 66 bpm 68 bpm   Oxygen Saturation (Admit) 90 % 93 % 99 % 94 % 91 %   Oxygen Saturation (Exercise) 86 % 86 %  slowly improved to 89 with rest, continue to assess 92 % 84 %  with rest increased to 88% later sat 86 O2 increased to 10L 87 %   Oxygen Saturation (Exit) 98 % 96 % 100 % 100 % 100 %   Rating of Perceived Exertion (Exercise) _10 Perceived Dyspnea (Exercise) _11 Duration Progress to 45 minutes of aerobic exercise without signs/symptoms of physical distress Progress to 45 minutes of aerobic exercise without signs/symptoms of physical distress Progress to 45 minutes of aerobic exercise without signs/symptoms of physical distress Progress to 45 minutes of aerobic exercise without signs/symptoms of physical distress Progress to 45 minutes of aerobic exercise without signs/symptoms of physical distress   Intensity THRR unchanged THRR unchanged THRR unchanged THRR  unchanged THRR unchanged     Progression   Progression Continue to progress workloads to maintain intensity without signs/symptoms of physical distress. Continue to progress workloads to maintain intensity without signs/symptoms of physical distress. Continue to progress workloads to maintain intensity without signs/symptoms of physical distress. Continue to progress workloads to maintain intensity without signs/symptoms of physical distress. Continue to progress workloads to maintain intensity without signs/symptoms of physical distress.     Resistance Training   Training Prescription _12    Weight _13    Reps 10-12  10 minutes of strength training 10-12  10 minutes of strength training 10-12  10 minutes of strength training 10-12  10 minutes of strength training 10-12  10 minutes of strength training     Interval Training   Interval Training _14      Oxygen  Oxygen _0    Liters _1 8-10 10     Recumbant Bike   Level 5._2 Minutes _3 NuStep   Level _4 -   Minutes _5 -   METs 2.2 2.2 2.1 2.2  -     Track   Laps 8  - _6 Minutes 17  - _7 Exercise Review   Progression Yes  -  -  -  -   Row Name 11/17/16 1519 11/19/16 1604 11/24/16 1500 11/26/16 1539 12/17/16 1500     Response to Exercise   Blood Pressure (Admit) 100/52 100/50 98/62 104/60 106/70   Blood Pressure (Exercise) 121/74 103/65 100/62 112/60 104/62   Blood Pressure (Exit) _8 116/78 100/60   Heart Rate (Admit) 78 bpm 80 bpm 78 bpm 77 bpm 83 bpm   Heart Rate (Exercise) 97 bpm 105 bpm 98 bpm 95 bpm 95 bpm   Heart Rate (Exit) 67 bpm 68 bpm 70 bpm 67 bpm 69 bpm   Oxygen Saturation (Admit) 100 % 93 % 98 % 93 % 92 %   Oxygen Saturation (Exercise) 85 % 88 % 91 % 86 % 90 %   Oxygen Saturation (Exit) 100 % 100 % 100 % 100  % 100 %   Rating of Perceived Exertion (Exercise) _9 Perceived Dyspnea (Exercise) _10 Duration Progress to 45 minutes of aerobic exercise without signs/symptoms of physical distress Progress to 45 minutes of aerobic exercise without signs/symptoms of physical distress Progress to 45 minutes of aerobic exercise without signs/symptoms of physical distress Progress to 45 minutes of aerobic exercise without signs/symptoms of physical distress Progress to 45 minutes of aerobic exercise without signs/symptoms of physical distress   Intensity _11      Progression   Progression Continue to progress workloads to maintain intensity without signs/symptoms of physical distress. Continue to progress workloads to maintain intensity without signs/symptoms of physical distress. Continue to progress workloads to maintain intensity without signs/symptoms of physical distress. Continue to progress workloads to maintain intensity without signs/symptoms of physical distress. Continue to progress workloads to maintain intensity without signs/symptoms of physical distress.     Resistance Training   Training Prescription _12    Weight _13    Reps 10-12  10 minutes of strength training 10-12  10 minutes of strength training 10-12  10 minutes of strength training 10-12  10 minutes of strength training 10-15   Time  -  -  -  - 10 Minutes     Interval Training   Interval Training _14      Oxygen   Oxygen _15    Liters _16 Recumbant Bike   Level _17 Minutes _18 NuStep   Level 4  - 5  - 3  decreased level since hospitalized last week   Minutes 17  - 17  - 17   METs 2.2  - 2  - 2     Track   Laps 5 5  4 5  -   Minutes _0 -   Row Name 01/07/17  1617 01/19/17 1330 01/21/17 1608 02/02/17 1500 02/04/17 1612     Response to Exercise   Blood Pressure (Admit) 100/58 108/66 110/66 106/78 98/64   Blood Pressure (Exercise) 104/60 106/60 110/66 100/70 136/70   Blood Pressure (Exit) 92/64  - 110/72 110/60 104/60   Heart Rate (Admit) 81 bpm 85 bpm 95 bpm 84 bpm 84 bpm   Heart Rate (Exercise) 93 bpm 96 bpm 111 bpm 98 bpm 92 bpm   Heart Rate (Exit) 71 bpm  - 77 bpm 77 bpm 51 bpm   Oxygen Saturation (Admit) 81 %  2 liters increased to 92 on 8 liters 91 %   on 6 liters 89 % 96 % 97 %   Oxygen Saturation (Exercise) 86 % 79 %  increased to 87 on 10-15 liters 92 % 93 % 91 %   Oxygen Saturation (Exit) 96 % 98 %  10 liters at rest 93 % 98 % 100 %   Rating of Perceived Exertion (Exercise) _1 Perceived Dyspnea (Exercise) _2 Duration Progress to 45 minutes of aerobic exercise without signs/symptoms of physical distress Progress to 45 minutes of aerobic exercise without signs/symptoms of physical distress Progress to 45 minutes of aerobic exercise without signs/symptoms of physical distress Progress to 45 minutes of aerobic exercise without signs/symptoms of physical distress Progress to 45 minutes of aerobic exercise without signs/symptoms of physical distress   Intensity _3      Progression   Progression Continue to progress workloads to maintain intensity without signs/symptoms of physical distress. Continue to progress workloads to maintain intensity without signs/symptoms of physical distress. Continue to progress workloads to maintain intensity without signs/symptoms of physical distress. Continue to progress workloads to maintain intensity without signs/symptoms of physical distress. Continue to progress workloads to maintain intensity without signs/symptoms of physical distress.     Resistance Training   Training Prescription _4    Weight  _5    Reps 10-15 10-15 10-15 10-15 10-15   Time 10 Minutes 10 Minutes 10 Minutes 10 Minutes 10 Minutes     Interval Training   Interval Training _6      Oxygen   Oxygen _7    Liters 15 6-15 6-15 6-15 8     Recumbant Bike   Level 5 5  decreased to level 1 for desaturation 5 5  -   Minutes 34 _8 -     NuStep   Level  -  -  - 3 3   Minutes  -  -  - 17 17   METs  -  -  - 1.8 1.8     Track   Laps  -  - _9 Minutes  -  - _10 Row Name 02/11/17 1619 02/16/17 1500 02/18/17 1500         Response to Exercise   Blood Pressure (Admit) 110/70 112/66 96/50     Blood Pressure (Exercise) 120/66 120/60 110/70     Blood Pressure (Exit)  - 106/70 110/60     Heart Rate (Admit) 86 bpm 90 bpm 92 bpm     Heart Rate (Exercise) 97 bpm  99 bpm 100 bpm     Heart Rate (Exit) 71 bpm 70 bpm 86 bpm     Oxygen Saturation (Admit) 96 % 90 % 98 %     Oxygen Saturation (Exercise) 85 % 88 % 94 %     Oxygen Saturation (Exit) 96 % 100 % 98 %     Rating of Perceived Exertion (Exercise) _0 Perceived Dyspnea (Exercise) _1 Duration Progress to 45 minutes of aerobic exercise without signs/symptoms of physical distress Progress to 45 minutes of aerobic exercise without signs/symptoms of physical distress Progress to 45 minutes of aerobic exercise without signs/symptoms of physical distress     Intensity THRR unchanged THRR unchanged THRR unchanged       Progression   Progression Continue to progress workloads to maintain intensity without signs/symptoms of physical distress. Continue to progress workloads to maintain intensity without signs/symptoms of physical distress. Continue to progress workloads to maintain intensity without signs/symptoms of physical distress.       Resistance Training   Training Prescription Yes Yes Yes     Weight green bands green bands  green bands     Reps 10-15 10-15 10-15     Time 10 Minutes 10 Minutes 10 Minutes       Interval Training   Interval Training No No No       Oxygen   Oxygen Continuous Continuous Continuous     Liters 6-8 6-8 8       Recumbant Bike   Level 4 4  -     Minutes 17 17  -       NuStep   Level  - 4 4     Minutes  - 17 17     METs  - 1.6 1.8       Track   Laps  - 4 5     Minutes _2 Exercise Comments:     Exercise Comments    Row Name 09/21/16 1645 10/08/16 1003 11/10/16 0829 11/30/16 1613     Exercise Comments Patient is doing well in PR. Open to workload increases. Works hard when he is here. Oxygen saturation is monitored closely. Will cont. to monitor and progress. Patient is doing well in PR. Open to workload increases. Works hard when he is here. Oxygen saturation is monitored closely. Will cont. to monitor and progress. Patient is slowly progressing in rehab. MET levels place him at a low level. Will cont. to monitor and progress.  Patient is slowly progressing in rehab. MET levels place him at a low level. Keeping oxygen saturations up has been a problem--now on 10 liters with most exercise. Will cont. to monitor and progress.        Exercise Goals and Review:   Exercise Goals Re-Evaluation :     Exercise Goals Re-Evaluation    Row Name 12/28/16 1159 01/26/17 0754 02/22/17 1423         Exercise Goal Re-Evaluation   Exercise Goals Review Increase Physical Activity;Increase Strenth and Stamina Increase Physical Activity;Increase Strenth and Stamina Increase Physical Activity;Increase Strenth and Stamina     Comments Patient was out of rehab 12/01/16-12/17/16. Patient needs extension in program due to deconditioning and lengthy time away from rehab. Will cont to monitor and progress as appropriate.  Patient needs extension in program due to deconditioning and lengthy time away from rehab. Setbacks in workload intensities.  Will cont to monitor and progress as  appropriate.  Patient is now progressing well! Patient is feeling much better since his setback. Will cont to monitor and progress as able.     Expected Outcomes Patient will cont. to increase endurance and stamina through the exercises here at rehab. He will also practice his breathing techinques to become less short of breath on exertion.  Patient will cont. to increase endurance and stamina through the exercises here at rehab. He will also practice his breathing techinques to become less short of breath on exertion.  Through exercising at rehab and at home the patient will increase strength and stamina which will make ADL's easier to preform.        Discharge Exercise Prescription (Final Exercise Prescription Changes):     Exercise Prescription Changes - 02/18/17 1500      Response to Exercise   Blood Pressure (Admit) 96/50   Blood Pressure (Exercise) 110/70   Blood Pressure (Exit) 110/60   Heart Rate (Admit) 92 bpm   Heart Rate (Exercise) 100 bpm   Heart Rate (Exit) 86 bpm   Oxygen Saturation (Admit) 98 %   Oxygen Saturation (Exercise) 94 %   Oxygen Saturation (Exit) 98 %   Rating of Perceived Exertion (Exercise) 13   Perceived Dyspnea (Exercise) 2   Duration Progress to 45 minutes of aerobic exercise without signs/symptoms of physical distress   Intensity THRR unchanged     Progression   Progression Continue to progress workloads to maintain intensity without signs/symptoms of physical distress.     Resistance Training   Training Prescription Yes   Weight green bands   Reps 10-15   Time 10 Minutes     Interval Training   Interval Training No     Oxygen   Oxygen Continuous   Liters 8     NuStep   Level 4   Minutes 17   METs 1.8     Track   Laps 5   Minutes 17      Nutrition:  Target Goals: Understanding of nutrition guidelines, daily intake of sodium <15101m, cholesterol <2065m calories 30% from fat and 7% or less from saturated fats, daily to have 5 or more  servings of fruits and vegetables.  Biometrics:    Nutrition Therapy Plan and Nutrition Goals:     Nutrition Therapy & Goals - 09/17/16 1554      Nutrition Therapy   Diet High Calorie, High Protein     Personal Nutrition Goals   Nutrition Goal Wt gain to a wt gain goal of 138-150 lb [facility] [place] [facility] [place] individualized specific dietary modifications aiming towards targeted core components such as weight, hypertension, lipid management, diabetes, heart failure and other comorbidities.   Expected Outcomes Short Term Goal: Understand basic principles of dietary content, such as calories, fat, sodium, cholesterol and nutrients.;Long Term Goal: Adherence to prescribed nutrition plan.      Nutrition Discharge: Rate Your Plate Scores:     Nutrition Assessments - 09/17/16 1554      Rate Your Plate Scores   Pre Score 49  score appropriate due to pt needs to gain wt      Nutrition Goals Re-Evaluation:   Nutrition Goals Discharge (Final Nutrition Goals Re-Evaluation):   Psychosocial: Target Goals: Acknowledge presence or absence of significant depression and/or stress, maximize coping skills, provide positive support system. Participant is able to verbalize types and  ability to use techniques and skills needed for reducing stress and depression.  Initial Review & Psychosocial Screening:   Quality of Life Scores:   PHQ-9: Recent Review Flowsheet Data    Depression screen [facility] 2/9 08/07/2016   Decreased Interest 0   Down, Depressed, Hopeless 0   PHQ - 2 Score 0     Interpretation of Total Score  Total Score Depression Severity:  1-4 = Minimal depression, 5-9 = Mild depression, 10-14 = Moderate depression, 15-19 = Moderately severe depression, 20-27 = Severe depression   Psychosocial Evaluation and Intervention:   Psychosocial Re-Evaluation:     Psychosocial Re-Evaluation     Row Name 09/14/16 1412 10/05/16 1422 11/09/16 1435 12/01/16 0902 12/24/16 0913     Psychosocial Re-Evaluation   Current issues with  -  -  -  - None Identified   Comments No psychosocial issues identified at this time. No psychosocial issues identified No psychosocial issues identified no psychosocial issues have been identified  -   Interventions Encouraged to attend Pulmonary Rehabilitation for the exercise Encouraged to attend Pulmonary Rehabilitation for the exercise Encouraged to attend Pulmonary Rehabilitation for the exercise Encouraged to attend Pulmonary Rehabilitation for the exercise Encouraged to attend Pulmonary Rehabilitation for the exercise   Continue Psychosocial Services  _0  Follow up required   [place] Name 01/25/17 1448 02/22/17 1156           Psychosocial Re-Evaluation   Current issues with None Identified None Identified      Interventions Encouraged to attend Pulmonary Rehabilitation for the exercise Encouraged to attend Pulmonary Rehabilitation for the exercise      Continue Psychosocial Services  No Follow up required No Follow up required         Psychosocial Discharge (Final Psychosocial Re-Evaluation):     Psychosocial Re-Evaluation - 02/22/17 1156      Psychosocial Re-Evaluation   Current issues with None Identified   Interventions Encouraged to attend Pulmonary Rehabilitation for the exercise   Continue Psychosocial Services  No Follow up required      Education: Education Goals: Education classes will be provided on a weekly basis, covering required topics. Participant will state understanding/return demonstration of topics presented.  Learning Barriers/Preferences:   Education Topics: Risk Factor Reduction:  -Group instruction that is supported by a PowerPoint presentation. Instructor discusses the definition of a risk factor, different risk factors for pulmonary disease, and how the heart and lungs work together.     PULMONARY REHAB  OTHER RESPIRATORY from 02/18/2017 in [place]  Date  01/07/17  Educator  EP  Instruction Review Code  2- meets goals/outcomes      Nutrition for Pulmonary Patient:  -Group instruction provided by PowerPoint slides, verbal discussion, and written materials to support subject matter. The instructor gives an explanation and review of healthy diet recommendations, which includes a discussion on weight management, recommendations for fruit and vegetable consumption, as well as protein, fluid, caffeine, fiber, sodium, sugar, and alcohol. Tips for eating when patients are short of breath are discussed.   PULMONARY REHAB OTHER RESPIRATORY from 02/18/2017 in [place]  Date  10/29/16  Educator  RD  Instruction Review Code  2- meets goals/outcomes      Pursed Lip Breathing:  -Group instruction that is supported by demonstration and informational handouts. Instructor discusses the benefits of pursed lip and diaphragmatic breathing and detailed demonstration on how to preform both.  Oxygen Safety:  -Group instruction provided by PowerPoint, verbal discussion, and written material to support subject matter. There is an overview of "What is Oxygen" and "Why do we need it".  Instructor also reviews how to create a safe environment for oxygen use, the importance of using oxygen as prescribed, and the risks of noncompliance. There is a brief discussion on traveling with oxygen and resources the patient may utilize.   PULMONARY REHAB OTHER RESPIRATORY from 02/18/2017 in [place]  Date  01/21/17  Educator  rn  Instruction Review Code  2- meets goals/outcomes      Oxygen Equipment:  -Group instruction provided by [facility] Staff utilizing handouts, written materials, and equipment demonstrations.   PULMONARY REHAB OTHER RESPIRATORY from 02/18/2017 in [place]  Date  09/03/16   Educator  Rep  Instruction Review Code  2- meets goals/outcomes      Signs and Symptoms:  -Group instruction provided by written material and verbal discussion to support subject matter. Warning signs and symptoms of infection, stroke, and heart attack are reviewed and when to call the physician/911 reinforced. Tips for preventing the spread of infection discussed.   PULMONARY REHAB OTHER RESPIRATORY from 02/18/2017 in [place]  Date  02/18/17  Educator  rn  Instruction Review Code  2- meets goals/outcomes      Advanced Directives:  -Group instruction provided by verbal instruction and written material to support subject matter. Instructor reviews Advanced Directive laws and proper instruction for filling out document.   Pulmonary Video:  -Group video education that reviews the importance of medication and oxygen compliance, exercise, good nutrition, pulmonary hygiene, and pursed lip and diaphragmatic breathing for the pulmonary patient.   Exercise for the Pulmonary Patient:  -Group instruction that is supported by a PowerPoint presentation. Instructor discusses benefits of exercise, core components of exercise, frequency, duration, and intensity of an exercise routine, importance of utilizing pulse oximetry during exercise, safety while exercising, and options of places to exercise outside of rehab.     PULMONARY REHAB OTHER RESPIRATORY from 02/18/2017 in [place]  Date  09/17/16  Educator  EP  Instruction Review Code  2- meets goals/outcomes      Pulmonary Medications:  -Verbally interactive group education provided by instructor with focus on inhaled medications and proper administration.   PULMONARY REHAB OTHER RESPIRATORY from 02/18/2017 in [place]  Date  11/12/16  Educator  Pharm  Instruction Review Code  2- meets goals/outcomes      Anatomy and Physiology of the  Respiratory System and Intimacy:  -Group instruction provided by PowerPoint, verbal discussion, and written material to support subject matter. Instructor reviews respiratory cycle and anatomical components of the respiratory system and their functions. Instructor also reviews differences in obstructive and restrictive respiratory diseases with examples of each. Intimacy, Sex, and Sexuality differences are reviewed with a discussion on how relationships can change when diagnosed with pulmonary disease. Common sexual concerns are reviewed.   Knowledge Questionnaire Score:   Core Components/Risk Factors/Patient Goals at Admission:   Core Components/Risk Factors/Patient Goals Review:      Goals and Risk Factor Review    Row Name 09/14/16 1410 10/05/16 1419 11/09/16 1432 12/01/16 0900 12/24/16 0911     Core Components/Risk Factors/Patient Goals Review   Personal Goals Review Increase Strength and Stamina;Develop more efficient breathing techniques such as purse lipped breathing and diaphragmatic breathing and  practicing self-pacing with activity.;Improve shortness of breath with ADL's;Increase knowledge of respiratory medications and ability to use respiratory devices properly. Increase Strength and Stamina;Develop more efficient breathing techniques such as purse lipped breathing and diaphragmatic breathing and practicing self-pacing with activity.;Improve shortness of breath with ADL's;Increase knowledge of respiratory medications and ability to use respiratory devices properly. Increase Strength and Stamina;Develop more efficient breathing techniques such as purse lipped breathing and diaphragmatic breathing and practicing self-pacing with activity.;Improve shortness of breath with ADL's;Increase knowledge of respiratory medications and ability to use respiratory devices properly. Increase Strength and Stamina;Develop more efficient breathing techniques such as purse lipped breathing and  diaphragmatic breathing and practicing self-pacing with activity.;Improve shortness of breath with ADL's;Increase knowledge of respiratory medications and ability to use respiratory devices properly. Develop more efficient breathing techniques such as purse lipped breathing and diaphragmatic breathing and practicing self-pacing with activity.;Improve shortness of breath with ADL's;Increase knowledge of respiratory medications and ability to use respiratory devices properly.   Review progressing well, nustep level 4, recumbent bike level 4, 7 laps on track.  Needs 8 liters of oxygen while exercising.   has been sick the last week, equipment has stayed the same, will increase levels when recovered from illness. Becoming more knowledgeable re: oxygen use and how to monitor his oxygen saturations, progressing, purse lip breathing improving Has definitely met his goals, impressed with what he has learned while in program.  will graduate in 2 more sessions recent hospitalization for CHF, gained 10 pounds over a week.  Lost the 10 pounds in hospital, oxygen needs have increased.  Using 15 liters while exercising   Expected Outcomes expect his strength and stamina will increase as workloads increase.   continue to progress when recovered to continue meeting goals with time in program to continue exercising independently after discharge will extend to 36 sessions due to need to strength after hospitalization   Row Name 01/25/17 1440 02/22/17 1155           Core Components/Risk Factors/Patient Goals Review   Personal Goals Review Develop more efficient breathing techniques such as purse lipped breathing and diaphragmatic breathing and practicing self-pacing with activity.;Improve shortness of breath with ADL's Develop more efficient breathing techniques such as purse lipped breathing and diaphragmatic breathing and practicing self-pacing with activity.;Improve shortness of breath with ADL's      Review  continues to  have CHF issues despite Lasix, oxygen requirements are continuing to increase CHF seems to be controlled at this time, tolerating exercise well, using oxygen as prescribed.      Expected Outcomes progress as tolerated Coninue exercising after graduation         Core Components/Risk Factors/Patient Goals at Discharge (Final Review):      Goals and Risk Factor Review - 02/22/17 1155      Core Components/Risk Factors/Patient Goals Review   Personal Goals Review Develop more efficient breathing techniques such as purse lipped breathing and diaphragmatic breathing and practicing self-pacing with activity.;Improve shortness of breath with ADL's   Review CHF seems to be controlled at this time, tolerating exercise well, using oxygen as prescribed.   Expected Outcomes Coninue exercising after graduation      ITP Comments:   Comments: ITP REVIEW Pt is making expected progress toward pulmonary rehab goals after completing 31 sessions. Recommend continued exercise, life style modification, education, and utilization of breathing techniques to increase stamina and strength and decrease shortness of breath with exertion.

## 2017-02-23 NOTE — Progress Notes (Signed)
_0  ID: Gilbert Dominguez, male    DOB: Jul 04, 1946, 71 y.o.   MRN: [id number]  Chief Complaint  Patient presents with  . Follow-up    IPF     Referring provider: Leonard Downing, *  HPI: 56  yowm quit smoking 1996 with dx of RA around 2010 followed by [facility] referred 11/23/2013 to pulmonary clinic for ? ILD with pfts c/w mod restriction  09/04/2014 unchanged from 09/2013  Was in [place] , fault in [place] [employer]. ? Exposure to agent orange. ? Asbestosis   TEST  pfts 09/27/14        VC 2.2(59%) no obst with dlco 38 corrects to 63 - PFTs 09/04/2014 VC 2.3 (62%) no obst with dlco 38 corrects to 83%  - RA sats 62% 11/23/2013 at rest > started 24h 02 (see chronic respiratory failure)  - 12/12/2014  Walked RA  2 laps @ 185 ft each stopped due to  desat to 84% nl pace  - 11/23/2013 ESR 95 rx pred 20 mg bid  >  12/08/2013 = 14 > rx per [facility]  - PFTs 04/11/2015    VC 2.1 s obst and dlco 41 corrects to 75% - ESR 11/19/2015 = 14  - ESR 12/31/2015 =  48 and worse sob/cough > try prednisone 20 mg daily until better and then 10 mg maintenance until follow-up office visit. - PFTs  04/20/2016    VC 2.15 (60%) s obst and dlco 23/22 and corrects to 47%  - 07/21/2016 Referred to rehab > started early Nov 2017 - PFTs 02/02/2017   VC 2.12 (64%) and dlco  35/34 and dlco 46%  Echo 12/08/16  size with EF 55-60%. D-shaped interventricular septum suggestive of RV pressure/volume overload. Severely dilated RV with moderate systolic dysfunction. Severe pulmonary hypertension. Mild aortic stenosis. - CTa 12/08/16 neg PE   02/23/2017 Follow up : Pulmonary Fibrosis, Pulm HTN  And O2 RF  Patient returns for a one-month follow-up.. Patient has underlying pulmonary fibrosis. That is oxygen dependent at baseline 3 L at rest and 8 L with walking. Patient is involved in pulmonary rehabilitation. Patient was admitted in February this year and found to have severe pulmonary hypertension on echo. He is on Lasix 40 mg daily.    He does have RA followed by Dr. Charlestine Night. Was previously on MTX , has been off for ~1.62yr  On Prednisone 238mdaily ~ started in 2015.  He remains on Oxygen 2-3l/m rest and 8 l/m act/exercise  . Uses oximizer tubing .  Says overall he is breathing okay . Gets winded easily . Has cough mostly dry .  No fever or discolored mucus .  Leg swelling is imrpoved but not resolved.   We reviewed all his meds and organized them into a med calendar with pt education .    Allergies  Allergen Reactions  . Lovastatin Rash    Immunization History  Administered Date(s) Administered  . Influenza Split 07/19/2013, 07/19/2014, 07/20/2015  . Influenza, High Dose Seasonal PF 06/01/2016  . Pneumococcal Conjugate-13 10/19/2013    Past Medical History:  Diagnosis Date  . Chronic respiratory failure (H[facility]at[place]  . Hypertension   . Pulmonary fibrosis, postinflammatory (HCCraig  . Rheumatoid arthritis(714.0)     Tobacco History: History  Smoking Status  . Former Smoker  . Packs/day: 1.00  . Years: 36.00  . Types: Cigarettes  . Quit date: 10/14/1995  Smokeless Tobacco  . Never Used   Counseling given: Not Answered   Outpatient  Encounter Prescriptions as of 02/23/2017  Medication Sig  . aspirin 81 MG tablet Take 81 mg by mouth daily.  Marland Kitchen atorvastatin (LIPITOR) 20 MG tablet Take 20 mg by mouth daily.  . Calcium Carbonate-Vit D-Min (CALCIUM 600+D PLUS MINERALS PO) Take 1 tablet by mouth 2 (two) times daily.  . famotidine (PEPCID) 20 MG tablet Take 1 tablet (20 mg total) by mouth at bedtime.  . furosemide (LASIX) 20 MG tablet Take 2 tablets (40 mg total) by mouth daily.  Marland Kitchen loratadine (CLARITIN) 10 MG tablet Take 10 mg by mouth daily.  . OXYGEN 4lpm 24/7 and titrate to keep sats above 90 with exertion  [employer]  . pantoprazole (PROTONIX) 40 MG tablet TAKE 1 TABLET BY MOUTH EVERY DAY *TAKE 30-60 MINUTES BEFORE FIRST MEAL OF THE DAY*  . potassium chloride SA (K-DUR,KLOR-CON) 20 MEQ tablet Take 1 tablet (20  mEq total) by mouth daily.  . predniSONE (DELTASONE) 10 MG tablet TAKE 2 DAILY UNTIL BETTER THEN ONE DAILY UNTIL RETURN  . Pseudoephedrine-Guaifenesin (MUCINEX D MAX STRENGTH) [phone] MG TB12 Take 1 tablet by mouth 2 (two) times daily.   Marland Kitchen terazosin (HYTRIN) 1 MG capsule Take 2 capsules (2 mg total) by mouth at bedtime.   No facility-administered encounter medications on file as of 02/23/2017.      Review of Systems  Constitutional:   No  weight loss, night sweats,  Fevers, chills,  +fatigue, or  lassitude.  HEENT:   No headaches,  Difficulty swallowing,  Tooth/dental problems, or  Sore throat,                No sneezing, itching, ear ache,  +nasal congestion, post nasal drip,   CV:  No chest pain,  Orthopnea, PND, swelling in lower extremities, anasarca, dizziness, palpitations, syncope.   GI  No heartburn, indigestion, abdominal pain, nausea, vomiting, diarrhea, change in bowel habits, loss of appetite, bloody stools.   Resp: .  No chest wall deformity  Skin: no rash or lesions.  GU: no dysuria, change in color of urine, no urgency or frequency.  No flank pain, no hematuria   MS:  No joint pain or swelling.  No decreased range of motion.  No back pain.    Physical Exam  BP 94/60   Pulse 89   Ht 5' 3.5" (1.613 m)   Wt 133 lb 9.6 oz (60.6 kg)   SpO2 91%   BMI 23.29 kg/m   GEN: A/Ox3; pleasant , NAD, thin, frail on O2    HEENT:  [place]/AT,  EACs-clear, TMs-wnl, NOSE-clear, THROAT-clear, no lesions, no postnasal drip or exudate noted.   NECK:  Supple w/ fair ROM; no JVD; normal carotid impulses w/o bruits; no thyromegaly or nodules palpated; no lymphadenopathy.    RESP  BB crackles  no accessory muscle use, no dullness to percussion  CARD:  RRR, no m/r/g, 1-2 +  peripheral edema, pulses intact, no cyanosis or clubbing.  GI:   Soft & nt; nml bowel sounds; no organomegaly or masses detected.   Musco: Warm bil, no deformities or joint swelling noted.   Neuro: alert, no  focal deficits noted.    Skin: Warm, no lesions or rashes    Lab Results:  CBC  Imaging: Dg Chest 2 View  Result Date: 02/02/2017 CLINICAL DATA:  Routine.  Pulmonary fibrosis. EXAM: CHEST  2 VIEW COMPARISON:  01/19/2017, 11/19/2015, CT 12/08/2016 FINDINGS: Lungs are adequately inflated with chronic patchy increased interstitial changes within the mid to lower lungs which  are stable. No focal airspace process. No effusion. Known emphysematous disease. Stable cardiomegaly. Calcified plaque over the thoracic aorta. Degenerative change of the spine. IMPRESSION: No acute cardiopulmonary disease. 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 Cardiomegaly. Aortic atherosclerosis. Electronically Signed   By: Marin Olp M.D.   On: 02/02/2017 14:48     Assessment & Plan:   Chronic respiratory failure with hypoxia (HCC) Cont on o2  , keep sats >905.   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 Lake Bells.  Check bmet today .   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 Agent orange [place]   Plan  Cont on prednisone 78m daily.  Cont w/ Rheum follow up       TRexene Edison NP 02/23/2017

## 2017-02-23 NOTE — Assessment & Plan Note (Signed)
Cont on o2  , keep sats >905.

## 2017-02-23 NOTE — Assessment & Plan Note (Signed)
Cont on O2 and lasix  Refer to Dr. Lake Bells.  Check bmet today .

## 2017-02-24 ENCOUNTER — Encounter (HOSPITAL_COMMUNITY): Payer: Self-pay | Admitting: Emergency Medicine

## 2017-02-24 ENCOUNTER — Inpatient Hospital Stay (HOSPITAL_COMMUNITY)
Admission: EM | Admit: 2017-02-24 | Discharge: 2017-03-04 | DRG: 291 | Disposition: A | Discharge status: Home Health | Payer: Medicare Other | Attending: Internal Medicine | Admitting: Internal Medicine

## 2017-02-24 ENCOUNTER — Emergency Department (HOSPITAL_COMMUNITY): Payer: Medicare Other

## 2017-02-24 DIAGNOSIS — Z800 Family history of malignant neoplasm of digestive organs: Secondary | ICD-10-CM | POA: Diagnosis not present

## 2017-02-24 DIAGNOSIS — Z9981 Dependence on supplemental oxygen: Secondary | ICD-10-CM

## 2017-02-24 DIAGNOSIS — R748 Abnormal levels of other serum enzymes: Secondary | ICD-10-CM | POA: Diagnosis not present

## 2017-02-24 DIAGNOSIS — Z8249 Family history of ischemic heart disease and other diseases of the circulatory system: Secondary | ICD-10-CM

## 2017-02-24 DIAGNOSIS — I5033 Acute on chronic diastolic (congestive) heart failure: Secondary | ICD-10-CM | POA: Diagnosis present

## 2017-02-24 DIAGNOSIS — E1122 Type 2 diabetes mellitus with diabetic chronic kidney disease: Secondary | ICD-10-CM | POA: Diagnosis present

## 2017-02-24 DIAGNOSIS — Z888 Allergy status to other drugs, medicaments and biological substances status: Secondary | ICD-10-CM

## 2017-02-24 DIAGNOSIS — Z87891 Personal history of nicotine dependence: Secondary | ICD-10-CM | POA: Diagnosis not present

## 2017-02-24 DIAGNOSIS — E876 Hypokalemia: Secondary | ICD-10-CM | POA: Diagnosis present

## 2017-02-24 DIAGNOSIS — I5043 Acute on chronic combined systolic (congestive) and diastolic (congestive) heart failure: Secondary | ICD-10-CM | POA: Diagnosis not present

## 2017-02-24 DIAGNOSIS — K219 Gastro-esophageal reflux disease without esophagitis: Secondary | ICD-10-CM | POA: Diagnosis present

## 2017-02-24 DIAGNOSIS — R0600 Dyspnea, unspecified: Secondary | ICD-10-CM

## 2017-02-24 DIAGNOSIS — I2789 Other specified pulmonary heart diseases: Secondary | ICD-10-CM | POA: Diagnosis not present

## 2017-02-24 DIAGNOSIS — J84112 Idiopathic pulmonary fibrosis: Secondary | ICD-10-CM | POA: Diagnosis present

## 2017-02-24 DIAGNOSIS — J211 Acute bronchiolitis due to human metapneumovirus: Secondary | ICD-10-CM | POA: Diagnosis not present

## 2017-02-24 DIAGNOSIS — I509 Heart failure, unspecified: Secondary | ICD-10-CM | POA: Diagnosis not present

## 2017-02-24 DIAGNOSIS — N183 Chronic kidney disease, stage 3 unspecified: Secondary | ICD-10-CM | POA: Diagnosis present

## 2017-02-24 DIAGNOSIS — R918 Other nonspecific abnormal finding of lung field: Secondary | ICD-10-CM | POA: Diagnosis not present

## 2017-02-24 DIAGNOSIS — E785 Hyperlipidemia, unspecified: Secondary | ICD-10-CM | POA: Diagnosis present

## 2017-02-24 DIAGNOSIS — I2729 Other secondary pulmonary hypertension: Secondary | ICD-10-CM | POA: Diagnosis not present

## 2017-02-24 DIAGNOSIS — I5031 Acute diastolic (congestive) heart failure: Secondary | ICD-10-CM | POA: Diagnosis not present

## 2017-02-24 DIAGNOSIS — R0902 Hypoxemia: Secondary | ICD-10-CM

## 2017-02-24 DIAGNOSIS — I272 Pulmonary hypertension, unspecified: Secondary | ICD-10-CM | POA: Diagnosis not present

## 2017-02-24 DIAGNOSIS — I2721 Secondary pulmonary arterial hypertension: Secondary | ICD-10-CM | POA: Diagnosis present

## 2017-02-24 DIAGNOSIS — J9621 Acute and chronic respiratory failure with hypoxia: Secondary | ICD-10-CM | POA: Diagnosis present

## 2017-02-24 DIAGNOSIS — J8410 Pulmonary fibrosis, unspecified: Secondary | ICD-10-CM | POA: Diagnosis not present

## 2017-02-24 DIAGNOSIS — M069 Rheumatoid arthritis, unspecified: Secondary | ICD-10-CM | POA: Diagnosis present

## 2017-02-24 DIAGNOSIS — Z7982 Long term (current) use of aspirin: Secondary | ICD-10-CM

## 2017-02-24 DIAGNOSIS — Z789 Other specified health status: Secondary | ICD-10-CM | POA: Diagnosis not present

## 2017-02-24 DIAGNOSIS — I130 Hypertensive heart and chronic kidney disease with heart failure and stage 1 through stage 4 chronic kidney disease, or unspecified chronic kidney disease: Principal | ICD-10-CM | POA: Diagnosis present

## 2017-02-24 DIAGNOSIS — I350 Nonrheumatic aortic (valve) stenosis: Secondary | ICD-10-CM | POA: Diagnosis present

## 2017-02-24 DIAGNOSIS — E1165 Type 2 diabetes mellitus with hyperglycemia: Secondary | ICD-10-CM | POA: Diagnosis present

## 2017-02-24 DIAGNOSIS — Z6821 Body mass index (BMI) 21.0-21.9, adult: Secondary | ICD-10-CM | POA: Diagnosis not present

## 2017-02-24 DIAGNOSIS — E440 Moderate protein-calorie malnutrition: Secondary | ICD-10-CM | POA: Diagnosis present

## 2017-02-24 DIAGNOSIS — I2781 Cor pulmonale (chronic): Secondary | ICD-10-CM | POA: Diagnosis present

## 2017-02-24 DIAGNOSIS — R7989 Other specified abnormal findings of blood chemistry: Secondary | ICD-10-CM

## 2017-02-24 DIAGNOSIS — J9620 Acute and chronic respiratory failure, unspecified whether with hypoxia or hypercapnia: Secondary | ICD-10-CM

## 2017-02-24 DIAGNOSIS — I10 Essential (primary) hypertension: Secondary | ICD-10-CM | POA: Diagnosis not present

## 2017-02-24 DIAGNOSIS — I5032 Chronic diastolic (congestive) heart failure: Secondary | ICD-10-CM

## 2017-02-24 DIAGNOSIS — R0602 Shortness of breath: Secondary | ICD-10-CM | POA: Diagnosis present

## 2017-02-24 DIAGNOSIS — R778 Other specified abnormalities of plasma proteins: Secondary | ICD-10-CM | POA: Diagnosis present

## 2017-02-24 DIAGNOSIS — K761 Chronic passive congestion of liver: Secondary | ICD-10-CM | POA: Diagnosis present

## 2017-02-24 DIAGNOSIS — I50810 Right heart failure, unspecified: Secondary | ICD-10-CM

## 2017-02-24 DIAGNOSIS — I248 Other forms of acute ischemic heart disease: Secondary | ICD-10-CM | POA: Diagnosis not present

## 2017-02-24 HISTORY — DX: Heart failure, unspecified: I50.9

## 2017-02-24 LAB — CBC WITH DIFFERENTIAL/PLATELET
Basophils Absolute: 0 10*3/uL (ref 0.0–0.1)
Basophils Relative: 0 %
EOS PCT: 0 %
Eosinophils Absolute: 0 10*3/uL (ref 0.0–0.7)
HCT: 44.4 % (ref 39.0–52.0)
Hemoglobin: 14.9 g/dL (ref 13.0–17.0)
LYMPHS PCT: 7 %
Lymphs Abs: 0.6 10*3/uL — ABNORMAL LOW (ref 0.7–4.0)
MCH: 29.4 pg (ref 26.0–34.0)
MCHC: 33.6 g/dL (ref 30.0–36.0)
MCV: 87.6 fL (ref 78.0–100.0)
MONO ABS: 0.4 10*3/uL (ref 0.1–1.0)
MONOS PCT: 4 %
Neutro Abs: 7.7 10*3/uL (ref 1.7–7.7)
Neutrophils Relative %: 89 %
Platelets: 170 10*3/uL (ref 150–400)
RBC: 5.07 MIL/uL (ref 4.22–5.81)
RDW: 17.1 % — AB (ref 11.5–15.5)
WBC: 8.7 10*3/uL (ref 4.0–10.5)

## 2017-02-24 LAB — BASIC METABOLIC PANEL
Anion gap: 8 (ref 5–15)
BUN: 35 mg/dL — AB (ref 6–20)
CALCIUM: 9 mg/dL (ref 8.9–10.3)
CO2: 26 mmol/L (ref 22–32)
CREATININE: 1.51 mg/dL — AB (ref 0.61–1.24)
Chloride: 101 mmol/L (ref 101–111)
GFR calc Af Amer: 51 mL/min — ABNORMAL LOW (ref >60)
GFR calc non Af Amer: 44 mL/min — ABNORMAL LOW (ref >60)
GLUCOSE: 121 mg/dL — AB (ref 65–99)
Potassium: 4.1 mmol/L (ref 3.5–5.1)
Sodium: 135 mmol/L (ref 135–145)

## 2017-02-24 LAB — I-STAT TROPONIN, ED: Troponin i, poc: 0.1 ng/mL (ref 0.00–0.08)

## 2017-02-24 LAB — BRAIN NATRIURETIC PEPTIDE: B Natriuretic Peptide: 2452.3 pg/mL — ABNORMAL HIGH (ref 0.0–100.0)

## 2017-02-24 LAB — GLUCOSE, CAPILLARY: Glucose-Capillary: 126 mg/dL — ABNORMAL HIGH (ref 65–99)

## 2017-02-24 MED ORDER — PANTOPRAZOLE SODIUM 40 MG PO TBEC
40.0000 mg | DELAYED_RELEASE_TABLET | Freq: Every day | ORAL | Status: DC
  Administered 2017-02-25 – 2017-03-04 (×8): 40 mg via ORAL
  Filled 2017-02-24 (×9): qty 1

## 2017-02-24 MED ORDER — ALBUTEROL SULFATE (2.5 MG/3ML) 0.083% IN NEBU
5.0000 mg | INHALATION_SOLUTION | Freq: Once | RESPIRATORY_TRACT | Status: AC
  Administered 2017-02-24: 5 mg via RESPIRATORY_TRACT
  Filled 2017-02-24: qty 6

## 2017-02-24 MED ORDER — FUROSEMIDE 10 MG/ML IJ SOLN
40.0000 mg | Freq: Once | INTRAMUSCULAR | Status: AC
  Administered 2017-02-24: 40 mg via INTRAVENOUS
  Filled 2017-02-24: qty 4

## 2017-02-24 MED ORDER — ACETAMINOPHEN 325 MG PO TABS
650.0000 mg | ORAL_TABLET | ORAL | Status: DC | PRN
  Administered 2017-02-25 – 2017-02-26 (×2): 650 mg via ORAL
  Filled 2017-02-24 (×2): qty 2

## 2017-02-24 MED ORDER — LORATADINE 10 MG PO TABS
10.0000 mg | ORAL_TABLET | Freq: Every day | ORAL | Status: DC
  Administered 2017-02-25 – 2017-03-04 (×8): 10 mg via ORAL
  Filled 2017-02-24 (×8): qty 1

## 2017-02-24 MED ORDER — POTASSIUM CHLORIDE CRYS ER 20 MEQ PO TBCR
20.0000 meq | EXTENDED_RELEASE_TABLET | Freq: Every day | ORAL | Status: DC
  Administered 2017-02-25 – 2017-03-04 (×8): 20 meq via ORAL
  Filled 2017-02-24 (×9): qty 1

## 2017-02-24 MED ORDER — ATORVASTATIN CALCIUM 20 MG PO TABS
20.0000 mg | ORAL_TABLET | Freq: Every day | ORAL | Status: DC
  Administered 2017-02-25 – 2017-03-04 (×8): 20 mg via ORAL
  Filled 2017-02-24 (×8): qty 1

## 2017-02-24 MED ORDER — TERAZOSIN HCL 2 MG PO CAPS
2.0000 mg | ORAL_CAPSULE | Freq: Every day | ORAL | Status: DC
  Administered 2017-02-25 – 2017-03-03 (×8): 2 mg via ORAL
  Filled 2017-02-24 (×9): qty 1

## 2017-02-24 MED ORDER — SODIUM CHLORIDE 0.9% FLUSH
3.0000 mL | Freq: Two times a day (BID) | INTRAVENOUS | Status: DC
  Administered 2017-02-25 – 2017-03-03 (×13): 3 mL via INTRAVENOUS

## 2017-02-24 MED ORDER — SODIUM CHLORIDE 0.9% FLUSH
3.0000 mL | INTRAVENOUS | Status: DC | PRN
  Administered 2017-02-25: 3 mL via INTRAVENOUS
  Filled 2017-02-24: qty 3

## 2017-02-24 MED ORDER — ENOXAPARIN SODIUM 40 MG/0.4ML ~~LOC~~ SOLN
40.0000 mg | SUBCUTANEOUS | Status: DC
  Administered 2017-02-25 – 2017-03-02 (×6): 40 mg via SUBCUTANEOUS
  Filled 2017-02-24 (×8): qty 0.4

## 2017-02-24 MED ORDER — FUROSEMIDE 10 MG/ML IJ SOLN
40.0000 mg | Freq: Two times a day (BID) | INTRAMUSCULAR | Status: DC
  Administered 2017-02-25 – 2017-02-28 (×7): 40 mg via INTRAVENOUS
  Filled 2017-02-24 (×8): qty 4

## 2017-02-24 MED ORDER — ASPIRIN EC 81 MG PO TBEC
81.0000 mg | DELAYED_RELEASE_TABLET | Freq: Every day | ORAL | Status: DC
  Administered 2017-02-25 – 2017-03-04 (×8): 81 mg via ORAL
  Filled 2017-02-24 (×9): qty 1

## 2017-02-24 MED ORDER — SODIUM CHLORIDE 0.9 % IV SOLN: 250.0000 mL | INTRAVENOUS | Status: DC | PRN

## 2017-02-24 MED ORDER — FUROSEMIDE 10 MG/ML IJ SOLN
20.0000 mg | INTRAMUSCULAR | Status: AC
  Administered 2017-02-24: 20 mg via INTRAVENOUS

## 2017-02-24 MED ORDER — ONDANSETRON HCL 4 MG/2ML IJ SOLN
4.0000 mg | Freq: Four times a day (QID) | INTRAMUSCULAR | Status: DC | PRN
  Administered 2017-02-26: 4 mg via INTRAVENOUS
  Filled 2017-02-24: qty 2

## 2017-02-24 MED ORDER — IPRATROPIUM-ALBUTEROL 0.5-2.5 (3) MG/3ML IN SOLN
3.0000 mL | RESPIRATORY_TRACT | Status: DC | PRN
  Administered 2017-02-26: 3 mL via RESPIRATORY_TRACT
  Filled 2017-02-24: qty 3

## 2017-02-24 MED ORDER — FAMOTIDINE 20 MG PO TABS
20.0000 mg | ORAL_TABLET | Freq: Every day | ORAL | Status: DC
  Administered 2017-02-25 – 2017-03-03 (×8): 20 mg via ORAL
  Filled 2017-02-24 (×8): qty 1

## 2017-02-24 MED ORDER — PREDNISONE 20 MG PO TABS
20.0000 mg | ORAL_TABLET | Freq: Every day | ORAL | Status: DC
  Administered 2017-02-25 – 2017-02-26 (×2): 20 mg via ORAL
  Filled 2017-02-24 (×2): qty 1

## 2017-02-24 NOTE — Progress Notes (Signed)
Patient transferred to [place] from ED. Patient sating at 82 on 4L nasal canula. Increased patient to 6L nasal canula sats 85. Placed patient on nonrebreather mask. Patient sating at 97 on 10L nonrebreather. Will continue to monitor patient.

## 2017-02-24 NOTE — ED Notes (Signed)
Dr. Tamala Julian called about positive trop of 0.10, MD states pt is not having pain at this time, trop is related to the ischemia for fluid overload, no cardiac related, pt ok to go to telemetry bed.
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
## 2017-02-24 NOTE — ED Triage Notes (Signed)
Pt brought to ed by EMS from home for increase SOB, pt states he is a O2 24 hours dependent, getting worse today, Hx pulmonary fibrosis. 87% on 2L at home on EMS arrival up to 92% on 4L by EMS. Pt placed on NRM on arrival to ED, due to SPO2 low 85% on 5L [place]. BP 132/80, HR 88, R-22, SPO2 91% on 4L [place], CBG 198, NSR on monitor. Pt denies any pain at this time.

## 2017-02-24 NOTE — ED Notes (Signed)
Report attempted at 2228, second attempt will be make in 10 more min.
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
## 2017-02-25 ENCOUNTER — Inpatient Hospital Stay (HOSPITAL_COMMUNITY): Payer: Medicare Other

## 2017-02-25 ENCOUNTER — Encounter (HOSPITAL_COMMUNITY): Payer: Self-pay | Admitting: *Deleted

## 2017-02-25 ENCOUNTER — Ambulatory Visit (HOSPITAL_COMMUNITY): Payer: Medicare Other

## 2017-02-25 DIAGNOSIS — I5031 Acute diastolic (congestive) heart failure: Secondary | ICD-10-CM

## 2017-02-25 DIAGNOSIS — N183 Chronic kidney disease, stage 3 unspecified: Secondary | ICD-10-CM | POA: Diagnosis present

## 2017-02-25 DIAGNOSIS — E440 Moderate protein-calorie malnutrition: Secondary | ICD-10-CM | POA: Insufficient documentation

## 2017-02-25 LAB — BLOOD GAS, ARTERIAL
ACID-BASE EXCESS: 1.5 mmol/L (ref 0.0–2.0)
BICARBONATE: 25.6 mmol/L (ref 20.0–28.0)
Drawn by: 441371
FIO2: 50
O2 Content: 12 L/min
O2 Saturation: 93.6 %
PATIENT TEMPERATURE: 99.4
PH ART: 7.405 (ref 7.350–7.450)
PO2 ART: 74.5 mmHg — AB (ref 83.0–108.0)
pCO2 arterial: 42 mmHg (ref 32.0–48.0)

## 2017-02-25 LAB — RESPIRATORY PANEL BY PCR
Adenovirus: NOT DETECTED
BORDETELLA PERTUSSIS-RVPCR: NOT DETECTED
Chlamydophila pneumoniae: NOT DETECTED
Coronavirus 229E: NOT DETECTED
Coronavirus HKU1: NOT DETECTED
Coronavirus NL63: NOT DETECTED
Coronavirus OC43: NOT DETECTED
INFLUENZA A-RVPPCR: NOT DETECTED
INFLUENZA B-RVPPCR: NOT DETECTED
METAPNEUMOVIRUS-RVPPCR: DETECTED — AB
Mycoplasma pneumoniae: NOT DETECTED
PARAINFLUENZA VIRUS 2-RVPPCR: NOT DETECTED
PARAINFLUENZA VIRUS 3-RVPPCR: NOT DETECTED
Parainfluenza Virus 1: NOT DETECTED
Parainfluenza Virus 4: NOT DETECTED
RESPIRATORY SYNCYTIAL VIRUS-RVPPCR: NOT DETECTED
RHINOVIRUS / ENTEROVIRUS - RVPPCR: NOT DETECTED

## 2017-02-25 LAB — BASIC METABOLIC PANEL
Anion gap: 10 (ref 5–15)
BUN: 26 mg/dL — ABNORMAL HIGH (ref 6–20)
CALCIUM: 8.8 mg/dL — AB (ref 8.9–10.3)
CHLORIDE: 100 mmol/L — AB (ref 101–111)
CO2: 28 mmol/L (ref 22–32)
CREATININE: 1.25 mg/dL — AB (ref 0.61–1.24)
GFR calc non Af Amer: 56 mL/min — ABNORMAL LOW (ref >60)
GLUCOSE: 113 mg/dL — AB (ref 65–99)
Potassium: 3.2 mmol/L — ABNORMAL LOW (ref 3.5–5.1)
Sodium: 138 mmol/L (ref 135–145)

## 2017-02-25 LAB — CBC WITH DIFFERENTIAL/PLATELET
BASOS PCT: 0 %
Basophils Absolute: 0 10*3/uL (ref 0.0–0.1)
EOS ABS: 0 10*3/uL (ref 0.0–0.7)
Eosinophils Relative: 0 %
HEMATOCRIT: 42.5 % (ref 39.0–52.0)
Hemoglobin: 13.9 g/dL (ref 13.0–17.0)
LYMPHS ABS: 0.8 10*3/uL (ref 0.7–4.0)
Lymphocytes Relative: 8 %
MCH: 28.7 pg (ref 26.0–34.0)
MCHC: 32.7 g/dL (ref 30.0–36.0)
MCV: 87.6 fL (ref 78.0–100.0)
MONOS PCT: 5 %
Monocytes Absolute: 0.5 10*3/uL (ref 0.1–1.0)
NEUTROS ABS: 8.8 10*3/uL — AB (ref 1.7–7.7)
NEUTROS PCT: 87 %
Platelets: 160 10*3/uL (ref 150–400)
RBC: 4.85 MIL/uL (ref 4.22–5.81)
RDW: 17.2 % — ABNORMAL HIGH (ref 11.5–15.5)
WBC: 10.1 10*3/uL (ref 4.0–10.5)

## 2017-02-25 LAB — INFLUENZA PANEL BY PCR (TYPE A & B)
INFLAPCR: NEGATIVE
INFLBPCR: NEGATIVE

## 2017-02-25 LAB — TROPONIN I
Troponin I: 0.15 ng/mL (ref <0.03)
Troponin I: 0.13 ng/mL (ref <0.03)

## 2017-02-25 MED ORDER — POTASSIUM CHLORIDE CRYS ER 20 MEQ PO TBCR: 60.0000 meq | EXTENDED_RELEASE_TABLET | Freq: Two times a day (BID) | ORAL | Status: DC

## 2017-02-25 MED ORDER — ENSURE ENLIVE PO LIQD
237.0000 mL | Freq: Two times a day (BID) | ORAL | Status: DC
  Administered 2017-02-25 – 2017-02-28 (×5): 237 mL via ORAL

## 2017-02-25 MED ORDER — POTASSIUM CHLORIDE CRYS ER 20 MEQ PO TBCR
40.0000 meq | EXTENDED_RELEASE_TABLET | ORAL | Status: AC
  Administered 2017-02-25: 40 meq via ORAL
  Filled 2017-02-25: qty 2

## 2017-02-25 MED ORDER — FUROSEMIDE 10 MG/ML IJ SOLN
40.0000 mg | Freq: Once | INTRAMUSCULAR | Status: AC
  Administered 2017-02-25: 40 mg via INTRAVENOUS
  Filled 2017-02-25: qty 4

## 2017-02-25 NOTE — Progress Notes (Signed)
Pt is alert and sleepy off and on. O2 sat dropped to 75% on 6L. increased to 50% 02 MD is Aware. Will Update that pt is not successfully at weaning

## 2017-02-25 NOTE — Progress Notes (Signed)
New orders for Stat ABG

## 2017-02-25 NOTE — Progress Notes (Signed)
OT Cancellation Note  Patient Details Name: Gilbert Dominguez MRN: [id number] DOB: [birth year]   Cancelled Treatment:    Reason Eval/Treat Not Completed: Patient at procedure or test/ unavailable Patient at CT.  Will follow as able.  [name] 02/25/2017, 2:57 PM

## 2017-02-25 NOTE — Progress Notes (Signed)
Initial Nutrition Assessment  DOCUMENTATION CODES:   Severe malnutrition in context of chronic illness  INTERVENTION:   -Encourage smaller, more frequent meals; snacks ordered between meals while in-patient  -Ensure Enlive po BID, each supplement provides 350 kcal and 20 grams of protein. Encouraged pt to increase Boost/Ensure intake to 2x /day at home  -RD provided "Heart Failure Nutrition Therapy for the Undernourished" handout from the [employer]. Reviewed patient's dietary recall. Provided examples on ways to decrease sodium intake in diet while consuming adequate calories and protein. Discouraged intake of processed foods and use of salt shaker. Encouraged fresh fruits and vegetables as well as whole grain sources of carbohydrates to maximize fiber intake. RD discussed why it is important for patient to adhere to diet recommendations, and emphasized the role of fluids, foods to avoid, and importance of weighing self daily. Teach back method used. Pt very receptive to education, adherence likely.    NUTRITION DIAGNOSIS:   Malnutrition (Severe) related to chronic illness (pulmonary fibrosis, CHF) as evidenced by severe depletion of body fat, severe depletion of muscle mass.  GOAL:   Patient will meet greater than or equal to 90% of their needs  MONITOR:   PO intake, Supplement acceptance, Labs, Weight trends  REASON FOR ASSESSMENT:   Consult Diet education (CHF, wt loss on Boost)  ASSESSMENT:   71 yo male admitted with acute on chronic respiratory failure with CHF. Pt with hx of pulmonary fibrosis on home oxygen, HTN, dyslipidemia, RA  Pt reports he has been eating well at home, eats 3 meals per snack, snacks and drinks 1 Boost per day.   Pt reports weight has been relatively stable lately, pt weighs himself 3 times daily and UBW between 127 to 134 pounds. Pt reports he weighed 172 pounds 2.5 years ago but recently has been able to maintain weight but  no wt gain. (23.8% wt loss over 2.5 years with no wt gain)  Nutrition-Focused physical exam completed. Findings are moderate to severe fat depletion, mild/moderate to severe muscle depletion, and moderate edema.   Labs: potassium 3.2, Creatinine 1.25 (improving) Meds: lasix, KCl, prednisone  Diet Order:  Diet Heart Room service appropriate? Yes; Fluid consistency: Thin  Skin:  Reviewed, no issues  Last BM:  02/24/17  Height:   Ht Readings from Last 1 Encounters:  02/24/17 _0  (1.6 m)    Weight:   Wt Readings from Last 1 Encounters:  02/25/17 131 lb 14.4 oz (59.8 kg)    BMI:  Body mass index is 23.37 kg/m.  Estimated Nutritional Needs:   Kcal:  2712-9290 kcals  Protein:  80-95 g  Fluid:  >/= 1.6 L  EDUCATION NEEDS:   Education needs addressed  Kerman Passey MS, RD, LDN [phone] Pager  [phone] Weekend/On-Call Pager

## 2017-02-25 NOTE — Progress Notes (Signed)
Patient weaned off non-rebreather to a venturi mask at 50% on 12L. Patient sats 99. Patient resting in bed respirations 18. Will continue to monitor patient.

## 2017-02-25 NOTE — Progress Notes (Signed)
LMOMTCB x 1 

## 2017-02-25 NOTE — Progress Notes (Signed)
Pt unable to wean from simple mask and wants Nasal canula, MD Ordered High Flow N/C. Order Placed and Resp. Therapist  Notified to set will Set up

## 2017-02-25 NOTE — Progress Notes (Signed)
Patient ID: Gilbert Dominguez, male   DOB: [birth year], 71 y.o.   MRN: [id number]  PROGRESS NOTE    Gilbert Dominguez  WKG:[id number] DOB: 1946/08/05 DOA: 02/24/2017 PCP: Leonard Downing, MD   Brief Narrative:  71 year old male with medical history of pulmonary fibrosis, chronic hypoxic respiratory failure on home oxygen 2 L at rest and up to 8 L on exertion, hypertension, dyslipidemia, RA presented with shortness of breath progressively worsening and significant weight gain. He was admitted with acute and chronic hypoxic respiratory failure and acute diastolic CHF exacerbation and positive troponins, started on Lasix intravenously.   Assessment & Plan:   Principal Problem:   CHF exacerbation ([place]) Active Problems:   Postinflammatory pulmonary fibrosis (HCC)   Essential hypertension   Acute on chronic respiratory failure (HCC)   Elevated troponin   Hyperlipidemia   CKD (chronic kidney disease) stage 3, GFR 30-59 ml/min   1. Acute Diastolic CHF exacerbation:. - Continue with Lasix 40 mg IV twice a day. Follow-up 2-D echo. Strict input and output, daily weight. Monitor creatinine. -Cardiology consult called. We will follow up with their recommendations  2. Acute on chronic respiratory failure with hypoxia with pulmonary fibrosis and pulmonary artery hypertension:  - Spoke to Dr. Creig Hines Nestor/on-call pulmonologist about the patient. He recommended intravenous diuresis and outpatient follow-up with Dr. Melvyn Novas and Dr. Lake Bells. If respiratory status worsens, will get formal pulmonary consultation. - Continue with oxygen supplementation and wean if able. CT Chest If Respiratory Status Worsens. - Duonebs q4hr prn SOB wheezing - Continue home prednisone - PT/OT eval  3. Elevated troponin:  probably secondary to above - Trend cardiac troponin; 2-D echo; cardiology evaluation  4. Chronic kidney disease stage III: Patient near baseline. creatinine which appears to range 1.13-1.68 -  Recheck BMP in a.m.   5. Essential hypertension: Monitor blood pressure; continue Lasix and Terazosin  6. Hyperlipidemia - Continue Lipitor   7. GERD - Continue Pepcid and Protonix   8. Hypokalemia: Replace, repeat labs in a.m.   DVT prophylaxis: Lovenox Code Status:  Full Family Communication: None present at bedside Disposition Plan: Home in 2-4 days if condition improves  Consultants: Cardiology; Spoke to Dr. Tera Partridge on phone on 02/25/2017  Procedures: None  Antimicrobials: None   Subjective: Patient seen and examined at bedside. He is still extremely short of breath but feels slightly better. Denies any current chest pain, nausea, vomiting Objective: Vitals:   02/25/17 0127 02/25/17 0516 02/25/17 0600 02/25/17 0813  BP: (!) 116/58 139/68  107/88  Pulse: 90 90  86  Resp: _0 Temp: 98.7 F (37.1 C) 98.5 F (36.9 C)  (!) 101.5 F (38.6 C)  TempSrc: Oral Oral  Oral  SpO2: 93% 93% 99% 94%  Weight:  59.8 kg (131 lb 14.4 oz)    Height:        Intake/Output Summary (Last 24 hours) at 02/25/17 1022 Last data filed at 02/25/17 0949  Gross per 24 hour  Intake              240 ml  Output              700 ml  Net             -460 ml   Filed Weights   02/24/17 1930 02/24/17 2323 02/25/17 0516  Weight: 60.3 kg (133 lb) 57.9 kg (127 lb 11.2 oz) 59.8 kg (131 lb 14.4 oz)    Examination:  General exam: Mild distress secondary  to shortness of breath  Respiratory system: Bilateral decreased breath sound at bases with scattered crackles.  Cardiovascular system: S1 & S2 heard, rate controlled  Gastrointestinal system: Soft, nontender, nondistended, bowel sounds positive  Central nervous system: Alert and oriented. No focal neurological deficits. Moving extremities Extremities: No cyanosis, clubbing. 1+ pitting edema present Skin: No rashes, lesions or ulcers Psychiatry: Judgement and insight appear normal. Mood & affect appropriate.     Data  Reviewed: I have personally reviewed following labs and imaging studies  CBC:  Recent Labs Lab 02/24/17 1939 02/25/17 0438  WBC 8.7 10.1  NEUTROABS 7.7 8.8*  HGB 14.9 13.9  HCT 44.4 42.5  MCV 87.6 87.6  PLT 170 408   Basic Metabolic Panel:  Recent Labs Lab 02/23/17 1258 02/24/17 1939 02/25/17 0438  NA 134* 135 138  K 4.3 4.1 3.2*  CL 97 101 100*  CO2 _0 GLUCOSE 240* 121* 113*  BUN 35* 35* 26*  CREATININE 1.51* 1.51* 1.25*  CALCIUM 10.0 9.0 8.8*   GFR: Estimated Creatinine Clearance: 43 mL/min (A) (by C-G formula based on SCr of 1.25 mg/dL (H)). Liver Function Tests: No results for input(s): AST, ALT, ALKPHOS, BILITOT, PROT, ALBUMIN in the last 168 hours. No results for input(s): LIPASE, AMYLASE in the last 168 hours. No results for input(s): AMMONIA in the last 168 hours. Coagulation Profile: No results for input(s): INR, PROTIME in the last 168 hours. Cardiac Enzymes:  Recent Labs Lab 02/25/17 0438  TROPONINI 0.15*   BNP (last 3 results)  Recent Labs  01/20/17 1642  PROBNP 1,867.0*   HbA1C: No results for input(s): HGBA1C in the last 72 hours. CBG:  Recent Labs Lab 02/24/17 2335  GLUCAP 126*   Lipid Profile: No results for input(s): CHOL, HDL, LDLCALC, TRIG, CHOLHDL, LDLDIRECT in the last 72 hours. Thyroid Function Tests: No results for input(s): TSH, T4TOTAL, FREET4, T3FREE, THYROIDAB in the last 72 hours. Anemia Panel: No results for input(s): VITAMINB12, FOLATE, FERRITIN, TIBC, IRON, RETICCTPCT in the last 72 hours. Sepsis Labs: No results for input(s): PROCALCITON, LATICACIDVEN in the last 168 hours.  No results found for this or any previous visit (from the past 240 hour(s)).       Radiology Studies: Dg Chest 2 View  Result Date: 02/24/2017 CLINICAL DATA:  Acute onset of shortness of breath. Current history of pulmonary fibrosis. Initial encounter. EXAM: CHEST  2 VIEW COMPARISON:  Chest radiograph performed 02/02/2017  FINDINGS: Underlying pulmonary fibrosis and chronic interstitial changes are relatively stable from the prior study. No definite superimposed focal airspace consolidation is seen. No pleural effusion or pneumothorax is identified. The heart is mildly enlarged. No acute osseous abnormalities are identified. IMPRESSION: Relatively stable appearance to pulmonary fibrosis and chronic interstitial changes. Mild cardiomegaly. No definite superimposed focal airspace consolidation seen. Electronically Signed   By: Garald Balding M.D.   On: 02/24/2017 20:46        Scheduled Meds: . aspirin EC  81 mg Oral Daily  . atorvastatin  20 mg Oral Daily  . enoxaparin (LOVENOX) injection  40 mg Subcutaneous Q24H  . famotidine  20 mg Oral QHS  . furosemide  40 mg Intravenous BID  . loratadine  10 mg Oral Daily  . pantoprazole  40 mg Oral QAC breakfast  . potassium chloride SA  20 mEq Oral Daily  . predniSONE  20 mg Oral Q breakfast  . sodium chloride flush  3 mL Intravenous Q12H  . terazosin  2 mg Oral  QHS   Continuous Infusions: . sodium chloride       LOS: 1 day        Aline August, MD [facility] Pager [phone]  If 7PM-7AM, please contact night-coverage [email or web address] Password [email or web address] 02/25/2017, 10:22 AM

## 2017-02-25 NOTE — Progress Notes (Signed)
Placed on venturi mask at this time due to pt not tolerating HFNC stating it was giving him a headache and per low sats. Pt tolerating venturi mask well at this time. RT to continue to monitor as needed.

## 2017-02-25 NOTE — Progress Notes (Signed)
PT Cancellation Note  Patient Details Name: Gilbert Dominguez MRN: [id number] DOB: July 18, 1946   Cancelled Treatment:    Reason Eval/Treat Not Completed: Patient at procedure or test/unavailable. Pt in CT. Will check back as time allows.    Scheryl Marten PT, DPT  [phone]  02/25/2017, 3:00 PM

## 2017-02-25 NOTE — Progress Notes (Signed)
Pt is alert and oriented MD reviewed his CT Scan and wants a extra dose of Lasix if Blood Pressure is up greater 830 systolic.

## 2017-02-25 NOTE — Progress Notes (Signed)
Patient had an incontinent episode. Condom catheter came off and patients bed was soaked with urine. Nurse unable to get accurate output for this occurence. Condom catheter replaced will continue to monitor patient.

## 2017-02-26 DIAGNOSIS — I50810 Right heart failure, unspecified: Secondary | ICD-10-CM

## 2017-02-26 DIAGNOSIS — I272 Pulmonary hypertension, unspecified: Secondary | ICD-10-CM

## 2017-02-26 DIAGNOSIS — J9621 Acute and chronic respiratory failure with hypoxia: Secondary | ICD-10-CM

## 2017-02-26 DIAGNOSIS — J211 Acute bronchiolitis due to human metapneumovirus: Secondary | ICD-10-CM

## 2017-02-26 DIAGNOSIS — I2729 Other secondary pulmonary hypertension: Secondary | ICD-10-CM

## 2017-02-26 DIAGNOSIS — R918 Other nonspecific abnormal finding of lung field: Secondary | ICD-10-CM

## 2017-02-26 LAB — BLOOD GAS, ARTERIAL
Acid-Base Excess: 6.5 mmol/L — ABNORMAL HIGH (ref 0.0–2.0)
Bicarbonate: 29.4 mmol/L — ABNORMAL HIGH (ref 20.0–28.0)
Drawn by: 246861
FIO2: 80
O2 CONTENT: 25 L/min
O2 Saturation: 96.5 %
PH ART: 7.545 — AB (ref 7.350–7.450)
Patient temperature: 98.6
pCO2 arterial: 34.1 mmHg (ref 32.0–48.0)
pO2, Arterial: 78.5 mmHg — ABNORMAL LOW (ref 83.0–108.0)

## 2017-02-26 LAB — BASIC METABOLIC PANEL
ANION GAP: 8 (ref 5–15)
BUN: 29 mg/dL — AB (ref 6–20)
CALCIUM: 9.1 mg/dL (ref 8.9–10.3)
CO2: 33 mmol/L — ABNORMAL HIGH (ref 22–32)
Chloride: 96 mmol/L — ABNORMAL LOW (ref 101–111)
Creatinine, Ser: 1.25 mg/dL — ABNORMAL HIGH (ref 0.61–1.24)
GFR calc Af Amer: >60 mL/min (ref >60)
GFR calc non Af Amer: 56 mL/min — ABNORMAL LOW (ref >60)
GLUCOSE: 101 mg/dL — AB (ref 65–99)
POTASSIUM: 3.8 mmol/L (ref 3.5–5.1)
Sodium: 137 mmol/L (ref 135–145)

## 2017-02-26 LAB — PROCALCITONIN: Procalcitonin: 0.67 ng/mL

## 2017-02-26 LAB — CBC WITH DIFFERENTIAL/PLATELET
Basophils Absolute: 0 10*3/uL (ref 0.0–0.1)
Basophils Relative: 0 %
EOS PCT: 0 %
Eosinophils Absolute: 0 10*3/uL (ref 0.0–0.7)
HEMATOCRIT: 44.8 % (ref 39.0–52.0)
Hemoglobin: 14.1 g/dL (ref 13.0–17.0)
LYMPHS PCT: 9 %
Lymphs Abs: 0.6 10*3/uL — ABNORMAL LOW (ref 0.7–4.0)
MCH: 28.6 pg (ref 26.0–34.0)
MCHC: 31.5 g/dL (ref 30.0–36.0)
MCV: 90.9 fL (ref 78.0–100.0)
MONO ABS: 0.3 10*3/uL (ref 0.1–1.0)
MONOS PCT: 5 %
NEUTROS ABS: 5.7 10*3/uL (ref 1.7–7.7)
Neutrophils Relative %: 86 %
Platelets: 123 10*3/uL — ABNORMAL LOW (ref 150–400)
RBC: 4.93 MIL/uL (ref 4.22–5.81)
RDW: 17.5 % — AB (ref 11.5–15.5)
WBC: 6.7 10*3/uL (ref 4.0–10.5)

## 2017-02-26 LAB — MAGNESIUM: Magnesium: 2 mg/dL (ref 1.7–2.4)

## 2017-02-26 MED ORDER — GUAIFENESIN ER 600 MG PO TB12
1200.0000 mg | ORAL_TABLET | Freq: Two times a day (BID) | ORAL | Status: DC
  Administered 2017-02-26 – 2017-03-04 (×13): 1200 mg via ORAL
  Filled 2017-02-26 (×14): qty 2

## 2017-02-26 MED ORDER — DEXTROSE 5 % IV SOLN
2.0000 g | INTRAVENOUS | Status: DC
  Administered 2017-02-26 – 2017-03-02 (×5): 2 g via INTRAVENOUS
  Filled 2017-02-26 (×6): qty 2

## 2017-02-26 MED ORDER — DEXTROSE 5 % IV SOLN
500.0000 mg | INTRAVENOUS | Status: DC
  Administered 2017-02-26 – 2017-02-27 (×2): 500 mg via INTRAVENOUS
  Filled 2017-02-26 (×3): qty 500

## 2017-02-26 MED ORDER — METHYLPREDNISOLONE SODIUM SUCC 125 MG IJ SOLR
80.0000 mg | Freq: Four times a day (QID) | INTRAMUSCULAR | Status: DC
  Administered 2017-02-26 – 2017-03-01 (×12): 80 mg via INTRAVENOUS
  Filled 2017-02-26 (×12): qty 2

## 2017-02-26 NOTE — Progress Notes (Signed)
Pharmacy Antibiotic Note  Gilbert Dominguez is a 71 y.o. male admitted on 02/24/2017 with shortness of breath Pharmacy has been consulted for Ceftriaxone dosing for CAP.  CKD stage III, SCr baseline 1.13-1.68 range per H&P. Ceftriaxone does not require dose adjustments for renal insufficienc/failure.  Azithromycin also starting today.  Plan: Ceftriaxone 2g IV q24h  No adjustments required for ceftriaxone if renal function changes. Pharmacist will sign off.  Please consult [place] again if further assistance needed.  Height: _0  (160 cm) Weight: 126 lb 3.2 oz (57.2 kg) IBW/kg (Calculated) : 56.9  Temp (24hrs), Avg:98.1 F (36.7 C), Min:97.4 F (36.3 C), Max:99.1 F (37.3 C)   Recent Labs Lab 02/23/17 1258 02/24/17 1939 02/25/17 0438 02/26/17 0529  WBC  --  8.7 10.1 6.7  CREATININE 1.51* 1.51* 1.25* 1.25*    Estimated Creatinine Clearance: 43 mL/min (A) (by C-G formula based on SCr of 1.25 mg/dL (H)).    Allergies  Allergen Reactions  . Lovastatin Rash    Antimicrobials this admission: Azithromycin 5/11>> Ceftriaxone 5/11>>  Microbiology results: 5/11 BCx: sent 5/11 Sputum: sent 5/9 resp panel PCR: metapneumovirus detected  Pharmacist will sign off.  Please consult [place] again if further assistance needed. Thank you for allowing pharmacy to be a part of this patient's care.  Nicole Cella, RPh Clinical Pharmacist [phone] [place] [phone] 02/26/2017 12:49 PM

## 2017-02-26 NOTE — Progress Notes (Signed)
RT NOTE:  Pt taken off BIPAP per request to eat dinner. Pt on BIPAP for comfort due to Pulm Fibr. SpO2 98% on 12L HFNC tolerated well. Pt wants to go back on BIPAP after dinner. RT will give patient 1 hour after eating and return him to BIPAP.

## 2017-02-26 NOTE — Consult Note (Addendum)
Name: Gilbert Dominguez MRN: [id number] DOB: [birth year]    ADMISSION DATE:  02/24/2017 CONSULTATION DATE: 02/26/2017  REFERRING MD :  Starla Link  CHIEF COMPLAINT:  Worsening dyspnea with hypoxia  BRIEF PATIENT DESCRIPTION: Elderly male supine in bed on heated high flow oxygen at 80% with 30 L flow. Moderate  respiratory distress, unable to speak in complete sentences. He is awake alert and appropriate. He is able to give a good history.  SIGNIFICANT EVENTS  02/21/2017>Worsening shortness of breath at home 02/24/2017 >Admit to [facility]  STUDIES:  PFTs 4/17/2018VC 2.12 (64%) and dlco 35/34 and dlco 46%  Echo 12/08/16  size with EF 55-60%. D-shaped interventricular septum suggestive of RV pressure/volume overload. Grade 1 diastolic dysfunction,Severely dilated RV with moderate systolic dysfunction. Severe pulmonary hypertension ( 61 mm Hg). Mild aortic stenosis. - CTA: 12/08/16 neg PE   HISTORY OF PRESENT ILLNESS:  Pt. With known Pulmonary Fibrosis on daily prednisone 20 mg  and  home oxygen at 2L [place] at rest and 8 L [place] with exertion. (Followed by [facility]) RA ( Previously treated with MTX followed by [place]). 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 Asbestos exposure. He presented to the ED 02/24/2017 after a 3 day history of worsening of baseline dyspnea. He had evolving LE  edema with intermittent cough. BNP was found to be > 2000. Pt. Was admitted for acute on chronic hypoxic respiratory failure in setting of acute dCHF.  IV Lasix was started 40 mg BID. Net diuresis of 3.1 L with 7 lb weight reduction( 133-127). Scr is stable. Dyspnea worsened 5/10, CT of the chest showed chronic interstitial disease and PHTN, stable mediastinal lymphadenopathy ( ? Reactive), Emphysema with honeycombing at the bases and underlying chronic  UIP.There was additional notation of cardiomegaly and PHTN.Troponin trend 0.1-->0.15-->0.13. RVP was + Metapneumonic virus.Pt required increased oxygen demands 5/10/pm. He was maintained on Venti Mask through the night and was transitioned to HHF nasal oxygen this am. ABG on 80% :   Ref. Range 02/26/2017 09:17  pH, Arterial Latest Ref Range: 7.350 - 7.450  7.545 (H)  pCO2 arterial Latest Ref Range: 32.0 - 48.0 mmHg 34.1  pO2, Arterial Latest Ref Range: 83.0 - 108.0 mmHg 78.5 (L)  Acid-Base Excess Latest Ref Range: 0.0 - 2.0 mmol/L 6.5 (H)  Bicarbonate Latest Ref Range: 20.0 - 28.0 mmol/L 29.4 (H)  O2 Saturation Latest Units: % 96.5   CCM was consulted for assistance with management of worsening acute on chronic  respiratory failure. He was transferred to SDU 02/26/2017 at 11:30 am.   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  has a past surgical history that includes Vasectomy (1973) and Acne cyst removal (1968). Prior to Admission medications   Medication Sig Start Date End Date Taking? Authorizing Provider  aspirin 81 MG tablet Take 81 mg by mouth daily.   Yes [provider]  atorvastatin (LIPITOR) 20 MG tablet Take 20 mg by mouth daily.   Yes [provider]  Calcium Carbonate-Vit D-Min (CALCIUM 600+D PLUS MINERALS PO) Take 1 tablet by mouth 2 (two) times daily.   Yes [provider]  famotidine (PEPCID) 20 MG tablet Take 1 tablet (20 mg total) by mouth  at bedtime. 01/27/17  Yes Tanda Rockers, MD  furosemide (LASIX) 20 MG tablet Take 2 tablets (40 mg total) by mouth daily. Patient taking differently: Take 20 mg by mouth daily.  12/12/16  Yes Bonnielee Haff, MD  loratadine (CLARITIN) 10 MG tablet Take 10 mg by mouth daily.   Yes [provider]  OXYGEN 4lpm 24/7 and titrate to keep  sats above 90 with exertion  [employer]   Yes [provider]  pantoprazole (PROTONIX) 40 MG tablet TAKE 1 TABLET BY MOUTH EVERY DAY *TAKE 30-60 MINUTES BEFORE FIRST MEAL OF THE DAY* 06/29/16  Yes Tanda Rockers, MD  potassium chloride SA (K-DUR,KLOR-CON) 20 MEQ tablet Take 1 tablet (20 mEq total) by mouth daily. 12/12/16  Yes Bonnielee Haff, MD  predniSONE (DELTASONE) 10 MG tablet TAKE 2 DAILY UNTIL BETTER THEN ONE DAILY UNTIL RETURN 01/28/17  Yes Tanda Rockers, MD  terazosin (HYTRIN) 1 MG capsule Take 2 capsules (2 mg total) by mouth at bedtime. Patient taking differently: Take 1 mg by mouth at bedtime.  12/12/16   Bonnielee Haff, MD   Allergies  Allergen Reactions  . Lovastatin Rash    FAMILY HISTORY:  family history includes Heart disease in his father; Liver cancer in his mother.   SOCIAL HISTORY:  reports that he quit smoking about 21 years ago. His smoking use included Cigarettes. He has a 36.00 pack-year smoking history. He has never used smokeless tobacco. He reports that he drinks about 1.8 oz of alcohol per week . He reports that he does not use drugs.  REVIEW OF SYSTEMS:   Constitutional: + for fever, no chills, weight loss, +malaise/fatigue and diaphoresis.  HENT: Negative for hearing loss, ear pain, nosebleeds, +congestion, sore throat, neck pain, tinnitus and ear discharge.   Eyes: Negative for blurred vision, double vision, photophobia, pain, discharge and redness.  Respiratory: +for cough, No hemoptysis, + for clear sputum production, + for shortness of breath, wheezing and denies  stridor.   Cardiovascular: Negative for chest pain, palpitations, orthopnea, claudication, + Left lower leg swelling and PND.  Gastrointestinal: Negative for heartburn, nausea, vomiting, abdominal pain, diarrhea, constipation, blood in stool and melena.  Genitourinary: Negative for dysuria, urgency, frequency, hematuria and flank pain.  Musculoskeletal: Negative for myalgias, back pain,  joint pain and falls.  Skin: Negative for itching and rash.  Neurological: Negative for dizziness, tingling, tremors, sensory change, speech change, focal weakness, seizures, loss of consciousness, weakness and headaches.  Endo/Heme/Allergies: Negative for environmental allergies and polydipsia. Does not bruise/bleed easily.  SUBJECTIVE: States his breathing is better after being placed on high flow oxygen. Has nasal congestion and just feels wiped out.  VITAL SIGNS: Temp:  [97.4 F (36.3 C)-99.4 F (37.4 C)] 98.8 F (37.1 C) (05/11 1025) Pulse Rate:  [79-89] 79 (05/11 0538) Resp:  [16-28] 28 (05/11 1025) BP: (109-123)/(67-77) 110/72 (05/11 0538) SpO2:  [85 %-99 %] 99 % (05/11 1025) FiO2 (%):  [50 %-90 %] 90 % (05/11 0843) Weight:  [126 lb 3.2 oz (57.2 kg)] 126 lb 3.2 oz (57.2 kg) (05/11 0538)  PHYSICAL EXAMINATION: General:  Awake and alert male in moderate respiratory distress on high flow oxygen Neuro: Awake , alert and oriented x 3, MAE x 4, follows commands, is appropriate HEENT: Normocephalic, atraumatic, wearing nasal cannula, + JVD Cardiovascular: RRR, no MRG noted  Lungs:  Diffuse wheezing throughout, crackles per bases, use of accessory muscles noted. Abdomen: soft, non-tender, non-distended, BS + Musculoskeletal: No obvious deformities, Bilateral upper and  lower extremity strength is equal and normal Skin: Warm dry and intact, no mottling noted.   Recent Labs Lab 02/24/17 1939 02/25/17 0438 02/26/17 0529  NA 135 138 137  K 4.1 3.2* 3.8  CL 101 100* 96*  CO2 26 28 33*  BUN 35* 26* 29*  CREATININE 1.51* 1.25* 1.25*  GLUCOSE 121* 113* 101*    Recent Labs Lab 02/24/17 1939 02/25/17 0438 02/26/17 0529  HGB 14.9 13.9 14.1  HCT 44.4 42.5 44.8  WBC 8.7 10.1 6.7  PLT 170 160 123*   Dg Chest 2 View  Result Date: 02/24/2017 CLINICAL DATA:  Acute onset of shortness of breath. Current history of pulmonary fibrosis. Initial encounter. EXAM: CHEST  2 VIEW  COMPARISON:  Chest radiograph performed 02/02/2017 FINDINGS: Underlying pulmonary fibrosis and chronic interstitial changes are relatively stable from the prior study. No definite superimposed focal airspace consolidation is seen. No pleural effusion or pneumothorax is identified. The heart is mildly enlarged. No acute osseous abnormalities are identified. IMPRESSION: Relatively stable appearance to pulmonary fibrosis and chronic interstitial changes. Mild cardiomegaly. No definite superimposed focal airspace consolidation seen. Electronically Signed   By: Garald Balding M.D.   On: 02/24/2017 20:46   Ct Chest Wo Contrast  Result Date: 02/25/2017 CLINICAL DATA:  Respiratory failure. EXAM: CT CHEST WITHOUT CONTRAST TECHNIQUE: Multidetector CT imaging of the chest was performed following the standard protocol without IV contrast. COMPARISON:  Chest x-ray 02/24/2017.  CT chest 12/08/2016. FINDINGS: Cardiovascular: Heart is enlarged. Coronary artery calcification is noted. Atherosclerotic calcification is noted in the wall of the thoracic aorta. Right main pulmonary artery measures 3.7 cm diameter. Left main pulmonary artery measures 3.4 cm diameter. Mediastinum/Nodes: Scattered mediastinal lymph nodes are again noted with some mediastinal lymphadenopathy evident. 14 mm precarinal lymph nodes seen image 59 series for. Anterior left hilar 13 mm short axis lymph node evident. 17 mm short axis subcarinal lymph node is seen on image 84. There is no axillary lymphadenopathy. Lungs/Pleura: Centrilobular and paraseptal emphysema is noted in the lungs bilaterally. Areas of bronchiectasis and architectural distortion are seen in the lungs bilaterally. Lung bases demonstrate subpleural honeycombing with associated bronchiectasis. Confluent opacity along the right major fissure is stable. Upper Abdomen: Unremarkable. Musculoskeletal: Bone windows reveal no worrisome lytic or sclerotic osseous lesions. IMPRESSION: 1. No  substantial change mediastinal lymphadenopathy, likely reactive. 2. Emphysema with interstitial changes and probable subpleural honeycombing at the bases. Features suggest a component of underlying chronic interstitial disease (UIP). 3. Cardiomegaly with coronary artery atherosclerosis. 4. Enlarged pulmonary arteries compatible pulmonary arterial hypertension. Electronically Signed   By: Misty Stanley M.D.   On: 02/25/2017 15:10    ASSESSMENT / PLAN:  Acute on chronic hypoxic respiratory failure in setting of Pulmonary Fibrosis, acute dCHF exacerbation and + Metapneumonic virus.   Plan: Continue Heated High Flow Oxygen to maintain oxygen saturation > 92% Agree with transfer to SDU as patient is high risk for decompensation and need for intubation. Aggressive Pulmonary Toilet when able ( IS, Flutter Valve) Mucinex 1200 mg BID Blood Cultures x 2 Sputum Culture Trend PCT Start Rocephin and Azithromycin for CAP and atypical coverage If decompensates, add vanc for MRSA coverage in immunocompromised patient. Convert prednisone to IV Solumedrol 80 mg Q 6 for several days. Follow CXR prn  Acute on Chronic dCHF Plan Continued diuresis per cards as renal function allows Telemetry monitoring  Additional care per Primary Team  Magdalen Spatz, AGACNP-BC [place] Pager # [phone] Pager: [phone]  02/26/2017, 10:57 AM  Attending Note:  71 year old male with extensive pulmonary disease including IPF and pulmonary HTN.  Patient presents with metapneumovirus exacerbatinghis respiratory failure.  PCCM consulted for that.  On exam, diffuse rales noted.  I reviewed chest CT myself, IPF and infiltrate noted.  Discussed with PCCM-NP.  Metapneumovirus:  - Supportive care  Hypoxemic respiratory failure:  - HFNC at 80% and titrate for sat of 90.  - Hope that we will be able to titrate down.  IPF:  - Solumedrol  - Mucinex  Pulmonary infiltrate:  - Pan  culture  - CAP coverage  - F/u on cultures  Pulmonary HTN  - Needs f/u as outpatient with Dr. [place] Bells for starting pulmonary HTN treatment  - Mind fluid status.  PCCM will follow.  Patient seen and examined, agree with above note.  I dictated the care and orders written for this patient under my direction.  Rush Farmer, [place]

## 2017-02-26 NOTE — Progress Notes (Signed)
RT NOTE:  Pt request BIPAP for WOB. Pt tolerating well. RT will leave on throughout night.

## 2017-02-26 NOTE — Progress Notes (Signed)
Pt slept well overnight on Venti Mask, saturation maintained at 95%, tylenol once provided for muscle cramps.No any other specific complain of chest pain and SOB/distress, pt is alert times 4, will continue to monitor

## 2017-02-26 NOTE — Progress Notes (Signed)
OT Cancellation Note  Patient Details Name: Gilbert Dominguez MRN: [id number] DOB: 1946-05-05   Cancelled Treatment:    Reason Eval/Treat Not Completed: Medical issues which prohibited therapy (pt transferring to ICU). Will follow up as pt is appropriate and time allows.  Binnie Kand M.S., OTR/L Pager: [phone]  02/26/2017, 9:53 AM

## 2017-02-26 NOTE — Progress Notes (Signed)
PT Cancellation Note  Patient Details Name: Gilbert Dominguez MRN: [id number] DOB: [birth year]   Cancelled Treatment:    Reason Eval/Treat Not Completed: Medical issues which prohibited therapy.  Heading up to ICU and nursing asked PT to hold the visit.  Check later to see if order is discontinued.   Ramond Dial 02/26/2017, 12:42 PM   Mee Hives, PT MS Acute Rehab Dept. Number: [place] and [place]

## 2017-02-26 NOTE — Progress Notes (Signed)
Patient requesting to come off of [place] and replace with venturi mask. Mask placed on patient at 12 L and 50% FIO2. SpO2 stable and patient states more comfortable. [place] remains at bedside.

## 2017-02-26 NOTE — Progress Notes (Signed)
Patient ID: Gilbert Dominguez, male   DOB: 28-May-1946, 71 y.o.   MRN: [id number]  PROGRESS NOTE    Dierks Wach  QJJ:[id number] DOB: July 26, 1946 DOA: 02/24/2017 PCP: Leonard Downing, MD   Brief Narrative:  71 year old male with medical history of pulmonary fibrosis, chronic hypoxic respiratory failure on home oxygen 2 L at rest and up to 8 L on exertion, hypertension, dyslipidemia, RA presented with shortness of breath progressively worsening and significant weight gain. He was admitted with acute and chronic hypoxic respiratory failure and acute diastolic CHF exacerbation and positive troponins, started on Lasix intravenously. His oxygen requirement has worsened. Cardiology evaluation is pending.  Assessment & Plan:   Principal Problem:   CHF exacerbation ([place]) Active Problems:   Postinflammatory pulmonary fibrosis ([facility])   Essential hypertension   Acute on chronic respiratory failure ([facility])   Elevated troponin   Hyperlipidemia   CKD (chronic kidney disease) stage 3, GFR 30-59 ml/min   Malnutrition of moderate degree   1. Acute Diastolic CHF exacerbation:. - Continue with Lasix 40 mg IV twice a day. Patient received 3 doses of Lasix yesterday. Follow-up 2-D echo. Strict input and output, daily weight. Monitor creatinine. -Cardiology consult called again today. We will follow up with their recommendations  2. Acute on chronic respiratory failure with hypoxia withpulmonary fibrosisand pulmonary artery hypertension:  - Patient still hypoxic on 100% oxygen. Stat ABG. I spoke to Dr. Nelda Marseille from pulmonary/critical care and requested the consult. Transfer the patient to stepdown unit. If respiratory status worsens, patient is okay for intubation.  - CT chest was done yesterday which did not show any obvious infiltrates. Monitor off antibiotics - Respiratory panel is positive for metapneumovirus. I would hold off on initiating IV steroids for now and wait for pulmonary evaluation.  Contact isolation for the same virus. -Continue Duonebs - Continue home prednisone  3. Elevated troponin: probably secondary to above -  2-D echo; cardiology evaluation  4. Chronic kidney disease stage III: Patient near baseline. - Recheck BMP in a.m.   5. Essential hypertension: Monitor blood pressure; continue Lasix and Terazosin  6. Hyperlipidemia - Continue Lipitor   7. GERD - Continue Pepcid and Protonix   8. Hypokalemia: Improved  DVT prophylaxis: Lovenox Code Status:  Full Family Communication: None present at bedside Disposition Plan:  depends on condition improvement  Consultants: Cardiology Pulmonary  Procedures: None  Antimicrobials: None    Subjective: Patient seen and examined at bedside. He is very short of breath but he feels slightly better. He denies any current chest pain, nausea, vomiting.  Objective: Vitals:   02/26/17 0843 02/26/17 1025 02/26/17 1139 02/26/17 1142  BP:    98/83  Pulse:    (!) 102  Resp:  (!) 28    Temp:  98.8 F (37.1 C)  99.1 F (37.3 C)  TempSrc:  Oral  Oral  SpO2: 96% 99% 97% 97%  Weight:      Height:        Intake/Output Summary (Last 24 hours) at 02/26/17 1210 Last data filed at 02/26/17 1127  Gross per 24 hour  Intake              800 ml  Output             3400 ml  Net            -2600 ml   Filed Weights   02/24/17 2323 02/25/17 0516 02/26/17 0538  Weight: 57.9 kg (127 lb 11.2 oz) 59.8 kg (131  lb 14.4 oz) 57.2 kg (126 lb 3.2 oz)    Examination:  General exam:  mild-to-moderate distress secondary to shortness of breath. Alert and awake  Respiratory system: Bilateral decreased breath sounds bases with prolonged expiration and scattered crackles.  Cardiovascular system:  S1-S2 positive, rate controlled  Gastrointestinal system: Soft, nontender, nondistended, bowel sounds positive  Central nervous system: Alert and oriented. No focal neurological deficits. Moving extremities Extremities: No  cyanosis, clubbing. 1+ pitting edema present Skin: No rashes, lesions or ulcers Psychiatry: Judgement and insight appear normal. Mood & affect appropriate.      Data Reviewed: I have personally reviewed following labs and imaging studies  CBC:  Recent Labs Lab 02/24/17 1939 02/25/17 0438 02/26/17 0529  WBC 8.7 10.1 6.7  NEUTROABS 7.7 8.8* 5.7  HGB 14.9 13.9 14.1  HCT 44.4 42.5 44.8  MCV 87.6 87.6 90.9  PLT 170 160 956*   Basic Metabolic Panel:  Recent Labs Lab 02/23/17 1258 02/24/17 1939 02/25/17 0438 02/26/17 0529  NA 134* 135 138 137  K 4.3 4.1 3.2* 3.8  CL 97 101 100* 96*  CO2 _0 33*  GLUCOSE 240* 121* 113* 101*  BUN 35* 35* 26* 29*  CREATININE 1.51* 1.51* 1.25* 1.25*  CALCIUM 10.0 9.0 8.8* 9.1  MG  --   --   --  2.0   GFR: Estimated Creatinine Clearance: 43 mL/min (A) (by C-G formula based on SCr of 1.25 mg/dL (H)). Liver Function Tests: No results for input(s): AST, ALT, ALKPHOS, BILITOT, PROT, ALBUMIN in the last 168 hours. No results for input(s): LIPASE, AMYLASE in the last 168 hours. No results for input(s): AMMONIA in the last 168 hours. Coagulation Profile: No results for input(s): INR, PROTIME in the last 168 hours. Cardiac Enzymes:  Recent Labs Lab 02/25/17 0438 02/25/17 1045  TROPONINI 0.15* 0.13*   BNP (last 3 results)  Recent Labs  01/20/17 1642  PROBNP 1,867.0*   HbA1C: No results for input(s): HGBA1C in the last 72 hours. CBG:  Recent Labs Lab 02/24/17 2335  GLUCAP 126*   Lipid Profile: No results for input(s): CHOL, HDL, LDLCALC, TRIG, CHOLHDL, LDLDIRECT in the last 72 hours. Thyroid Function Tests: No results for input(s): TSH, T4TOTAL, FREET4, T3FREE, THYROIDAB in the last 72 hours. Anemia Panel: No results for input(s): VITAMINB12, FOLATE, FERRITIN, TIBC, IRON, RETICCTPCT in the last 72 hours. Sepsis Labs: No results for input(s): PROCALCITON, LATICACIDVEN in the last 168 hours.  Recent Results (from the  past 240 hour(s))  Respiratory Panel by PCR     Status: Abnormal   Collection Time: 02/24/17 11:59 PM  Result Value Ref Range Status   Adenovirus NOT DETECTED NOT DETECTED Final   Coronavirus 229E NOT DETECTED NOT DETECTED Final   Coronavirus HKU1 NOT DETECTED NOT DETECTED Final   Coronavirus NL63 NOT DETECTED NOT DETECTED Final   Coronavirus OC43 NOT DETECTED NOT DETECTED Final   Metapneumovirus DETECTED (A) NOT DETECTED Final   Rhinovirus / Enterovirus NOT DETECTED NOT DETECTED Final   Influenza A NOT DETECTED NOT DETECTED Final   Influenza B NOT DETECTED NOT DETECTED Final   Parainfluenza Virus 1 NOT DETECTED NOT DETECTED Final   Parainfluenza Virus 2 NOT DETECTED NOT DETECTED Final   Parainfluenza Virus 3 NOT DETECTED NOT DETECTED Final   Parainfluenza Virus 4 NOT DETECTED NOT DETECTED Final   Respiratory Syncytial Virus NOT DETECTED NOT DETECTED Final   Bordetella pertussis NOT DETECTED NOT DETECTED Final   Chlamydophila pneumoniae NOT DETECTED NOT DETECTED  Final   Mycoplasma pneumoniae NOT DETECTED NOT DETECTED Final         Radiology Studies: Dg Chest 2 View  Result Date: 02/24/2017 CLINICAL DATA:  Acute onset of shortness of breath. Current history of pulmonary fibrosis. Initial encounter. EXAM: CHEST  2 VIEW COMPARISON:  Chest radiograph performed 02/02/2017 FINDINGS: Underlying pulmonary fibrosis and chronic interstitial changes are relatively stable from the prior study. No definite superimposed focal airspace consolidation is seen. No pleural effusion or pneumothorax is identified. The heart is mildly enlarged. No acute osseous abnormalities are identified. IMPRESSION: Relatively stable appearance to pulmonary fibrosis and chronic interstitial changes. Mild cardiomegaly. No definite superimposed focal airspace consolidation seen. Electronically Signed   By: Garald Balding M.D.   On: 02/24/2017 20:46   Ct Chest Wo Contrast  Result Date: 02/25/2017 CLINICAL DATA:   Respiratory failure. EXAM: CT CHEST WITHOUT CONTRAST TECHNIQUE: Multidetector CT imaging of the chest was performed following the standard protocol without IV contrast. COMPARISON:  Chest x-ray 02/24/2017.  CT chest 12/08/2016. FINDINGS: Cardiovascular: Heart is enlarged. Coronary artery calcification is noted. Atherosclerotic calcification is noted in the wall of the thoracic aorta. Right main pulmonary artery measures 3.7 cm diameter. Left main pulmonary artery measures 3.4 cm diameter. Mediastinum/Nodes: Scattered mediastinal lymph nodes are again noted with some mediastinal lymphadenopathy evident. 14 mm precarinal lymph nodes seen image 59 series for. Anterior left hilar 13 mm short axis lymph node evident. 17 mm short axis subcarinal lymph node is seen on image 84. There is no axillary lymphadenopathy. Lungs/Pleura: Centrilobular and paraseptal emphysema is noted in the lungs bilaterally. Areas of bronchiectasis and architectural distortion are seen in the lungs bilaterally. Lung bases demonstrate subpleural honeycombing with associated bronchiectasis. Confluent opacity along the right major fissure is stable. Upper Abdomen: Unremarkable. Musculoskeletal: Bone windows reveal no worrisome lytic or sclerotic osseous lesions. IMPRESSION: 1. No substantial change mediastinal lymphadenopathy, likely reactive. 2. Emphysema with interstitial changes and probable subpleural honeycombing at the bases. Features suggest a component of underlying chronic interstitial disease (UIP). 3. Cardiomegaly with coronary artery atherosclerosis. 4. Enlarged pulmonary arteries compatible pulmonary arterial hypertension. Electronically Signed   By: Misty Stanley M.D.   On: 02/25/2017 15:10        Scheduled Meds: . aspirin EC  81 mg Oral Daily  . atorvastatin  20 mg Oral Daily  . enoxaparin (LOVENOX) injection  40 mg Subcutaneous Q24H  . famotidine  20 mg Oral QHS  . feeding supplement (ENSURE ENLIVE)  237 mL Oral BID BM    . furosemide  40 mg Intravenous BID  . loratadine  10 mg Oral Daily  . pantoprazole  40 mg Oral QAC breakfast  . potassium chloride SA  20 mEq Oral Daily  . predniSONE  20 mg Oral Q breakfast  . sodium chloride flush  3 mL Intravenous Q12H  . terazosin  2 mg Oral QHS   Continuous Infusions: . sodium chloride       LOS: 2 days        Aline August, MD [facility] Pager [phone]  If 7PM-7AM, please contact night-coverage [email or web address] Password [email or web address] 02/26/2017, 12:10 PM

## 2017-02-26 NOTE — Progress Notes (Signed)
Pt had requested to be placed on "Breathing Mask"; RT placed him on Venturi temporarily but pt continued to work harder to breathe.  Pt very uncomfortable, stating, "this mask is not helping either, I feel like I'm going to hyperventilate".  Dr. Starla Link notified and verbal order for Bipap prn entered.  RT aware.  We will transfer pt to 3W29, for pt safety, this is a camera room and is much closer to nurses station.   Pt placed on Bipap and immediately he stated he felt much better.  Pt aware that he needs to be NPO while on Bipap.  Will continue to monitor closely.

## 2017-02-26 NOTE — Consult Note (Signed)
Cardiology Consultation:   Patient ID: Gilbert Dominguez; [id number]; Dec 11, 1945   Admit date: 02/24/2017 Date of Consult: 02/26/2017  Primary Care Provider: Leonard Downing, MD Primary Cardiologist: New to Dr. Debara Pickett   Patient Profile:   Gilbert Dominguez is a 71 y.o. [place] veteran ( ? Exposure to agent orange) with hx of chronic respiratory failure with hypoxia (on oxygen 24/7 - followed by Dr, Melvyn Novas), RA (previously treated with MTX - followed by Dr. Charlestine Night), prior 37 pack year hx of tobacco smoking (quit 1996), pHTN, pulmonary fibrosis and CKD stage III  who is being seen today for the evaluation of acute CHF at the request of Dr. Starla Link.   Echo 11/2016 showed normal LV function to 55-60%, grade 1 DD, mild AS, D-shaped interventricular septum suggestive of RV pressure/volume overload. Severely dilated RV with moderate systolic dysfunction. Severe pulmonary hypertension (30m Hg)  History of Present Illness:   CAaronmichael Brumbaughcame to ER 02/24/17 for worsening dyspnea, LE edema and intermittent cough. Admitted to acute on chronic hypoxic respiratory failure in setting of acute dCHF. BNP >2000. Started on IV lasix 474mBID with net diuresis of 3.1L so far. Weight down 7 lb( 133-->126). Scr stable. Troponin trend 0.1-->0.15-->0.13. CT of chest yesterday showed chronic interstitial disease and pHTN. Breathing worsen since yesterday. + Metapneumonic virus.  Will be transfer to stepdown and evaluated by pulmonary later today. On continuous oxygen with Venti Mask at night. Has orthopnea and PND.   He denies prior hx of cardiac issue. No chest pain, palpitations, dizziness, syncope, melena. Limited ambulation due to chronic hypoxia.   EKG 02/24/17:  The EKG was personally reviewed and demonstrates NSR at rate of 90 bpm. Repolarization abnormality.   Past Medical History:  Diagnosis Date  . CHF exacerbation ([facility]/2017  . Chronic respiratory failure ([facility] [place]  . Hypertension   . Pulmonary  fibrosis, postinflammatory ([facility]  . Rheumatoid arthritis(714.0)     Past Surgical History:  Procedure Laterality Date  . ACNE CYST REMOVAL  1968  . VASECTOMY  1973     Inpatient Medications: Scheduled Meds: . aspirin EC  81 mg Oral Daily  . atorvastatin  20 mg Oral Daily  . enoxaparin (LOVENOX) injection  40 mg Subcutaneous Q24H  . famotidine  20 mg Oral QHS  . feeding supplement (ENSURE ENLIVE)  237 mL Oral BID BM  . furosemide  40 mg Intravenous BID  . loratadine  10 mg Oral Daily  . pantoprazole  40 mg Oral QAC breakfast  . potassium chloride SA  20 mEq Oral Daily  . predniSONE  20 mg Oral Q breakfast  . sodium chloride flush  3 mL Intravenous Q12H  . terazosin  2 mg Oral QHS   Continuous Infusions: . sodium chloride     PRN Meds: sodium chloride, acetaminophen, ipratropium-albuterol, ondansetron (ZOFRAN) IV, sodium chloride flush  Allergies:    Allergies  Allergen Reactions  . Lovastatin Rash    Social History:   Social History   Social History  . Marital status: Single    Spouse name: N/A  . Number of children: 2  . Years of education: N/A   Occupational History  . Retired [employer]  Social History Main Topics  . Smoking status: Former Smoker    Packs/day: 1.00    Years: 36.00    Types: Cigarettes    Quit date: 10/14/1995  . Smokeless tobacco: Never Used  . Alcohol use 1.8 oz/week    3 Cans of  beer per week     Comment: occasionally  . Drug use: No  . Sexual activity: Not on file   Other Topics Concern  . Not on file   Social History Narrative  . No narrative on file    Family History:   The patient's family history includes Heart disease in his father; Liver cancer in his mother.  ROS:  Please see the history of present illness.  All other ROS reviewed and negative.     Physical Exam/Data:   Vitals:   02/25/17 2026 02/25/17 2100 02/26/17 0538 02/26/17 0843  BP:  123/77 110/72   Pulse: 85 89 79   Resp: (!) 22 (!) 22 18   Temp:   97.7 F (36.5 C) 97.9 F (36.6 C)   TempSrc:  Oral Oral   SpO2: 93% 96% 91% 96%  Weight:   57.2 kg (126 lb 3.2 oz)   Height:        Intake/Output Summary (Last 24 hours) at 02/26/17 1013 Last data filed at 02/26/17 1000  Gross per 24 hour  Intake              750 ml  Output             3400 ml  Net            -2650 ml   Filed Weights   02/24/17 2323 02/25/17 0516 02/26/17 0538  Weight: 57.9 kg (127 lb 11.2 oz) 59.8 kg (131 lb 14.4 oz) 57.2 kg (126 lb 3.2 oz)   Body mass index is 22.36 kg/m.  General:  Well nourished, well developed, in mild acute distress.  HEENT: normal Lymph: no adenopathy Neck: + JVD Endocrine:  No thryomegaly Vascular: No carotid bruits; FA pulses 2+ bilaterally without bruits  Cardiac:  normal S1, S2; RRR; no murmur. Lungs:  Diffuse wheezing with use of respiratory muscle Abd: soft, nontender, no hepatomegaly  Ext: trace BL LE edema Musculoskeletal:  No deformities, BUE and BLE strength normal and equal Skin: warm and dry  Neuro:  CNs 2-12 intact, no focal abnormalities noted Psych:  Normal affect    Relevant CV Studies: Echo 12/08/16 Study Conclusions  - Left ventricle: The cavity size was normal. Systolic function was   normal. The estimated ejection fraction was in the range of 55%   to 60%. Wall motion was normal; there were no regional wall   motion abnormalities. Doppler parameters are consistent with   abnormal left ventricular relaxation (grade 1 diastolic   dysfunction). - Ventricular septum: D-shaped interventricular septum suggestive   of RV pressure/volume overload. - Aortic valve: Trileaflet; moderately calcified leaflets. There   was mild stenosis. Mean gradient (S): 10 mm Hg. Valve area (VTI):   1.52 cm^2. - Mitral valve: There was no significant regurgitation. - Right ventricle: The cavity size was severely dilated. Systolic   function was moderately reduced. - Right atrium: The atrium was moderately dilated. - Tricuspid  valve: Peak RV-RA gradient (S): 61 mm Hg. - Systemic veins: IVC not visualized. - Pericardium, extracardiac: A trivial pericardial effusion was   identified.  Impressions:  - Normal LV size with EF 55-60%. D-shaped interventricular septum   suggestive of RV pressure/volume overload. 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 Severe pulmonary   hypertension. Mild aortic stenosis.  Laboratory Data:  Chemistry Recent Labs Lab 02/24/17 1939 02/25/17 0438 02/26/17 0529  NA 135 138 137  K 4.1 3.2* 3.8  CL 101 100* 96*  CO2  26 28 33*  GLUCOSE 121* 113* 101*  BUN 35* 26* 29*  CREATININE 1.51* 1.25* 1.25*  CALCIUM 9.0 8.8* 9.1  GFRNONAA 44* 56* 56*  GFRAA 51* >60 >60  ANIONGAP _0 No results for input(s): PROT, ALBUMIN, AST, ALT, ALKPHOS, BILITOT in the last 168 hours. Hematology Recent Labs Lab 02/24/17 1939 02/25/17 0438 02/26/17 0529  WBC 8.7 10.1 6.7  RBC 5.07 4.85 4.93  HGB 14.9 13.9 14.1  HCT 44.4 42.5 44.8  MCV 87.6 87.6 90.9  MCH 29.4 28.7 28.6  MCHC 33.6 32.7 31.5  RDW 17.1* 17.2* 17.5*  PLT 170 160 123*   Cardiac Enzymes Recent Labs Lab 02/25/17 0438 02/25/17 1045  TROPONINI 0.15* 0.13*    Recent Labs Lab 02/24/17 1947  TROPIPOC 0.10*    BNP Recent Labs Lab 02/24/17 1939  BNP 2,452.3*    DDimer No results for input(s): DDIMER in the last 168 hours.  Radiology/Studies:  Dg Chest 2 View  Result Date: 02/24/2017 CLINICAL DATA:  Acute onset of shortness of breath. Current history of pulmonary fibrosis. Initial encounter. EXAM: CHEST  2 VIEW COMPARISON:  Chest radiograph performed 02/02/2017 FINDINGS: Underlying pulmonary fibrosis and chronic interstitial changes are relatively stable from the prior study. No definite superimposed focal airspace consolidation is seen. No pleural effusion or pneumothorax is identified. The heart is mildly enlarged. No acute osseous abnormalities are identified. IMPRESSION: Relatively stable  appearance to pulmonary fibrosis and chronic interstitial changes. Mild cardiomegaly. No definite superimposed focal airspace consolidation seen. Electronically Signed   By: Garald Balding M.D.   On: 02/24/2017 20:46   Ct Chest Wo Contrast  Result Date: 02/25/2017 CLINICAL DATA:  Respiratory failure. EXAM: CT CHEST WITHOUT CONTRAST TECHNIQUE: Multidetector CT imaging of the chest was performed following the standard protocol without IV contrast. COMPARISON:  Chest x-ray 02/24/2017.  CT chest 12/08/2016. FINDINGS: Cardiovascular: Heart is enlarged. Coronary artery calcification is noted. Atherosclerotic calcification is noted in the wall of the thoracic aorta. Right main pulmonary artery measures 3.7 cm diameter. Left main pulmonary artery measures 3.4 cm diameter. Mediastinum/Nodes: Scattered mediastinal lymph nodes are again noted with some mediastinal lymphadenopathy evident. 14 mm precarinal lymph nodes seen image 59 series for. Anterior left hilar 13 mm short axis lymph node evident. 17 mm short axis subcarinal lymph node is seen on image 84. There is no axillary lymphadenopathy. Lungs/Pleura: Centrilobular and paraseptal emphysema is noted in the lungs bilaterally. Areas of bronchiectasis and architectural distortion are seen in the lungs bilaterally. Lung bases demonstrate subpleural honeycombing with associated bronchiectasis. Confluent opacity along the right major fissure is stable. Upper Abdomen: Unremarkable. Musculoskeletal: Bone windows reveal no worrisome lytic or sclerotic osseous lesions. IMPRESSION: 1. No substantial change mediastinal lymphadenopathy, likely reactive. 2. Emphysema with interstitial changes and probable subpleural honeycombing at the bases. Features suggest a component of underlying chronic interstitial disease (UIP). 3. Cardiomegaly with coronary artery atherosclerosis. 4. Enlarged pulmonary arteries compatible pulmonary arterial hypertension. Electronically Signed   By: Misty Stanley M.D.   On: 02/25/2017 15:10    Assessment and Plan:   1. Acute on chronic hypoxic respiratory failure in setting of acute dCHF exacerbation and + Metapneumonic virus.  - Has hx of pHTN and pulmonary fibrosis,  - His breathing has worsen today despite 24/7 oxygen and venti mask at night.  - CT of chest as above. Plan to transfer to stepdown unit on critical care service.   2. Acute on chronic dCHF - Echo 11/2016 showed  normal LV function to 55-60%, grade 1 DD, mild AS, sverely dilated RV with moderate systolic dysfunction. Severe pulmonary hypertension (67m Hg). - BNP >2000. Started on IV lasix 449mBID with net diuresis of 3.1L so far. Weight down 7 lb( 133-->126). Breathing has worsen. Exam and findings suggestive of right sided heart failure.  - As above. He will eventually required RHC when stable.   Dr. HiDebara Picketto see later today. Likely needs up tritration of diuretics. Already got IV lasix 4032mhis morning.   SigJarrett SohoA  02/26/2017 10:13 AM

## 2017-02-26 NOTE — Progress Notes (Signed)
Patient has a bed assignment at [phone]. Called report and will transfer. Patient stable on current high flow oxygen settings.

## 2017-02-27 LAB — COMPREHENSIVE METABOLIC PANEL
ALBUMIN: 2.5 g/dL — AB (ref 3.5–5.0)
ALT: 286 U/L — ABNORMAL HIGH (ref 17–63)
ANION GAP: 9 (ref 5–15)
AST: 537 U/L — AB (ref 15–41)
Alkaline Phosphatase: 129 U/L — ABNORMAL HIGH (ref 38–126)
BUN: 40 mg/dL — AB (ref 6–20)
CHLORIDE: 93 mmol/L — AB (ref 101–111)
CO2: 33 mmol/L — ABNORMAL HIGH (ref 22–32)
Calcium: 9 mg/dL (ref 8.9–10.3)
Creatinine, Ser: 1.38 mg/dL — ABNORMAL HIGH (ref 0.61–1.24)
GFR calc Af Amer: 57 mL/min — ABNORMAL LOW (ref >60)
GFR, EST NON AFRICAN AMERICAN: 50 mL/min — AB (ref >60)
Glucose, Bld: 198 mg/dL — ABNORMAL HIGH (ref 65–99)
POTASSIUM: 4.7 mmol/L (ref 3.5–5.1)
Sodium: 135 mmol/L (ref 135–145)
Total Bilirubin: 0.9 mg/dL (ref 0.3–1.2)
Total Protein: 5.3 g/dL — ABNORMAL LOW (ref 6.5–8.1)

## 2017-02-27 LAB — CBC WITH DIFFERENTIAL/PLATELET
BASOS ABS: 0.1 10*3/uL (ref 0.0–0.1)
Basophils Relative: 2 %
Eosinophils Absolute: 0 10*3/uL (ref 0.0–0.7)
Eosinophils Relative: 0 %
HCT: 41.9 % (ref 39.0–52.0)
Hemoglobin: 13.2 g/dL (ref 13.0–17.0)
Lymphocytes Relative: 10 %
Lymphs Abs: 0.6 10*3/uL — ABNORMAL LOW (ref 0.7–4.0)
MCH: 28.2 pg (ref 26.0–34.0)
MCHC: 31.5 g/dL (ref 30.0–36.0)
MCV: 89.5 fL (ref 78.0–100.0)
MONO ABS: 0.3 10*3/uL (ref 0.1–1.0)
Monocytes Relative: 4 %
Neutro Abs: 5.4 10*3/uL (ref 1.7–7.7)
Neutrophils Relative %: 84 %
PLATELETS: 135 10*3/uL — AB (ref 150–400)
RBC: 4.68 MIL/uL (ref 4.22–5.81)
RDW: 16.8 % — AB (ref 11.5–15.5)
WBC: 6.4 10*3/uL (ref 4.0–10.5)

## 2017-02-27 LAB — MAGNESIUM: MAGNESIUM: 2.2 mg/dL (ref 1.7–2.4)

## 2017-02-27 LAB — MRSA PCR SCREENING: MRSA BY PCR: NEGATIVE

## 2017-02-27 LAB — PROCALCITONIN: Procalcitonin: 1.66 ng/mL

## 2017-02-27 MED ORDER — ORAL CARE MOUTH RINSE
15.0000 mL | Freq: Two times a day (BID) | OROMUCOSAL | Status: DC
Start: 2017-02-27 — End: 2017-03-04
  Administered 2017-02-27 – 2017-03-03 (×8): 15 mL via OROMUCOSAL

## 2017-02-27 NOTE — Plan of Care (Signed)
Problem: Bowel/Gastric: Goal: Will not experience complications related to bowel motility Outcome: Progressing Per patient had a good, normal bowel movement today.

## 2017-02-27 NOTE — Progress Notes (Signed)
Patient ID: Gilbert Dominguez, male   DOB: October 19, 1946, 71 y.o.   MRN: [id number]  PROGRESS NOTE    Royer Cristobal  PPI:[id number] DOB: [birth year] DOA: 02/24/2017 PCP: Leonard Downing, MD   Brief Narrative:  71 year old male with medical history of pulmonary fibrosis, chronic hypoxic respiratory failure on home oxygen 2 L at rest and up to 8 L on exertion, hypertension, dyslipidemia, RA presented with shortness of breath progressively worsening and significant weight gain. He was admitted with acute and chronic hypoxic respiratory failure and acute diastolic CHF exacerbation and positive troponins, started on Lasix intravenously. His oxygen requirement worsened so he was transferred to stepdown unit. He was evaluated by pulmonary and started on intravenous steroids and antibiotics. 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 Acute Diastolic CHF exacerbation:. - Continue with Lasix 40 mg IV twice a day. Strict input and output, daily weight. Monitor creatinine. - Follow further recommendations from cardiology.  2. Acute on chronic respiratory failure with hypoxia withpulmonary fibrosisand pulmonary artery hypertension:  - Patient's respiratory status is improved. Continue with oxygen supplementation. Follow further recommendations from pulmonary. - Continue with Solu-Medrol and Rocephin and Zithromax - Respiratory panel was positive for metapneumovirus. -Continue Duonebs - Home prednisone has been discontinued  3. Elevated  troponin:probably secondary to above  4. Chronic kidney disease stage III: Creatinine stable, repeat BMP in a.m.   5. Essential hypertension: Monitor blood pressure; continue Lasix and Terazosin  6. Hyperlipidemia - Continue Lipitor   7. GERD - Continue Pepcid and Protonix   8. Hypokalemia: Improved     DVT prophylaxis:Lovenox Code Status:Full Family Communication:None present at bedside Disposition Plan: depends on condition improvement  Consultants:Cardiology Pulmonary  Procedures:None Antimicrobials: Rocephin and Zithromax from 02/26/2017  Subjective: Patient seen and examined at bedside. He feels much better than yesterday. His breathing is improving. No overnight fever, nausea, vomiting.  Objective: Vitals:   02/27/17 0700 02/27/17 0740 02/27/17 0928 02/27/17 1300  BP:  101/75    Pulse:      Resp:      Temp:      TempSrc:      SpO2: 97% 99% 97% 97%  Weight:      Height:        Intake/Output Summary (Last 24 hours) at 02/27/17 1515 Last data filed at 02/27/17 1000  Gross per 24 hour  Intake              970 ml  Output             1525 ml  Net             -555 ml   Filed Weights   02/25/17 0516 02/26/17 0538 02/27/17 0500  Weight: 59.8 kg (131 lb 14.4 oz) 57.2 kg (126 lb 3.2 oz) 55.5 kg (122 lb 4.8 oz)    Examination:  General exam: Alert, awake. More comfortable today  Respiratory system: Bilateral decreased breath sound at bases with scattered crackles Cardiovascular system: S1-S2 positive, rate controlled  Gastrointestinal system: Soft, nontender, nondistended bowel  sounds positive Extremities: No cyanosis, clubbing, 1+ pitting edema   Data Reviewed: I have personally reviewed following labs and imaging studies  CBC:  Recent Labs Lab 02/24/17 1939 02/25/17 0438 02/26/17 0529 02/27/17 0455  WBC 8.7 10.1 6.7 6.4  NEUTROABS 7.7 8.8* 5.7 5.4  HGB 14.9 13.9 14.1 13.2  HCT 44.4 42.5 44.8 41.9  MCV 87.6 87.6 90.9 89.5  PLT  170 160 123* 182*   Basic Metabolic Panel:  Recent Labs Lab 02/23/17 1258 02/24/17 1939 02/25/17 0438 02/26/17 0529 02/27/17 0455  NA 134* 135 138 137 135  K 4.3 4.1 3.2* 3.8 4.7  CL 97 101 100* 96* 93*  CO2 _0 33* 33*  GLUCOSE 240* 121* 113* 101* 198*  BUN 35* 35* 26* 29* 40*  CREATININE 1.51* 1.51* 1.25* 1.25* 1.38*  CALCIUM 10.0 9.0 8.8* 9.1 9.0  MG  --   --   --  2.0 2.2   GFR: Estimated Creatinine Clearance: 38 mL/min (A) (by C-G formula based on SCr of 1.38 mg/dL (H)). Liver Function Tests:  Recent Labs Lab 02/27/17 0455  AST 537*  ALT 286*  ALKPHOS 129*  BILITOT 0.9  PROT 5.3*  ALBUMIN 2.5*   No results for input(s): LIPASE, AMYLASE in the last 168 hours. No results for input(s): AMMONIA in the last 168 hours. Coagulation Profile: No results for input(s): INR, PROTIME in the last 168 hours. Cardiac Enzymes:  Recent Labs Lab 02/25/17 0438 02/25/17 1045  TROPONINI 0.15* 0.13*   BNP (last 3 results)  Recent Labs  01/20/17 1642  PROBNP 1,867.0*   HbA1C: No results for input(s): HGBA1C in the last 72 hours. CBG:  Recent Labs Lab 02/24/17 2335  GLUCAP 126*   Lipid Profile: No results for input(s): CHOL, HDL, LDLCALC, TRIG, CHOLHDL, LDLDIRECT in the last 72 hours. Thyroid Function Tests: No results for input(s): TSH, T4TOTAL, FREET4, T3FREE, THYROIDAB in the last 72 hours. Anemia Panel: No results for input(s): VITAMINB12, FOLATE, FERRITIN, TIBC, IRON, RETICCTPCT in the last 72 hours. Sepsis Labs:  Recent Labs Lab 02/26/17 1310 02/27/17 0455  PROCALCITON 0.67 1.66    Recent Results (from the past 240 hour(s))  Respiratory Panel by PCR     Status: Abnormal   Collection Time: 02/24/17 11:59 PM  Result Value Ref Range Status   Adenovirus NOT DETECTED NOT DETECTED Final   Coronavirus 229E NOT DETECTED NOT DETECTED Final   Coronavirus HKU1 NOT DETECTED NOT DETECTED Final   Coronavirus NL63 NOT DETECTED NOT DETECTED Final    Coronavirus OC43 NOT DETECTED NOT DETECTED Final   Metapneumovirus DETECTED (A) NOT DETECTED Final   Rhinovirus / Enterovirus NOT DETECTED NOT DETECTED Final   Influenza A NOT DETECTED NOT DETECTED Final   Influenza B NOT DETECTED NOT DETECTED Final   Parainfluenza Virus 1 NOT DETECTED NOT DETECTED Final   Parainfluenza Virus 2 NOT DETECTED NOT DETECTED Final   Parainfluenza Virus 3 NOT DETECTED NOT DETECTED Final   Parainfluenza Virus 4 NOT DETECTED NOT DETECTED Final   Respiratory Syncytial Virus NOT DETECTED NOT DETECTED Final   Bordetella pertussis NOT DETECTED NOT DETECTED Final   Chlamydophila pneumoniae NOT DETECTED NOT DETECTED Final   Mycoplasma pneumoniae NOT DETECTED NOT DETECTED Final  Culture, blood (routine x 2)     Status: None (Preliminary result)   Collection Time: 02/26/17  1:10 PM  Result Value Ref Range Status   Specimen Description BLOOD LEFT WRIST  Final   Special Requests   Final  BOTTLES DRAWN AEROBIC AND ANAEROBIC Blood Culture results may not be optimal due to an excessive volume of blood received in culture bottles   Culture NO GROWTH < 24 HOURS  Final   Report Status PENDING  Incomplete  Culture, blood (routine x 2)     Status: None (Preliminary result)   Collection Time: 02/26/17  1:11 PM  Result Value Ref Range Status   Specimen Description BLOOD RIGHT WRIST  Final   Special Requests   Final    BOTTLES DRAWN AEROBIC AND ANAEROBIC Blood Culture results may not be optimal due to an excessive volume of blood received in culture bottles   Culture NO GROWTH < 24 HOURS  Final   Report Status PENDING  Incomplete         Radiology Studies: No results found.      Scheduled Meds: . aspirin EC  81 mg Oral Daily  . atorvastatin  20 mg Oral Daily  . enoxaparin (LOVENOX) injection  40 mg Subcutaneous Q24H  . famotidine  20 mg Oral QHS  . feeding supplement (ENSURE ENLIVE)  237 mL Oral BID BM  . furosemide  40 mg Intravenous BID  . guaiFENesin  1,200  mg Oral BID  . loratadine  10 mg Oral Daily  . mouth rinse  15 mL Mouth Rinse BID  . methylPREDNISolone (SOLU-MEDROL) injection  80 mg Intravenous Q6H  . pantoprazole  40 mg Oral QAC breakfast  . potassium chloride SA  20 mEq Oral Daily  . sodium chloride flush  3 mL Intravenous Q12H  . terazosin  2 mg Oral QHS   Continuous Infusions: . sodium chloride    . azithromycin 500 mg (02/27/17 1258)  . cefTRIAXone (ROCEPHIN)  IV Stopped (02/26/17 1530)     LOS: 3 days        Aline August, MD [facility] Pager [phone]  If 7PM-7AM, please contact night-coverage [email or web address] Password [facility] 02/27/2017, 3:15 PM

## 2017-02-27 NOTE — Progress Notes (Signed)
Subjective:  He notes his breathing is significantly improved today.  Reasonable diuresis overnight with weight loss.  His oxygen requirements have gone down to where he can wear a nasal cannula now.  Objective:  Vital Signs in the last 24 hours: BP 101/75 (BP Location: Right Arm)   Pulse 70   Temp 97.9 F (36.6 C) (Oral)   Resp 18   Ht _0  (1.6 m)   Wt 55.5 kg (122 lb 4.8 oz)   SpO2 97%   BMI 21.66 kg/m   Physical Exam: Pleasant male in no acute distress wearing oxygen. Lungs: Crackles heard in both bases bilaterally  Cardiac:  Regular rhythm, normal S1 and S2, no S3, 1/6 systolic murmur Extremities: 1+ edema present  Intake/Output from previous day: 05/11 0701 - 05/12 0700 In: 1500 [P.O.:1200; IV Piggyback:300] Out: 1150 [Urine:1150]  Weight Filed Weights   02/25/17 0516 02/26/17 0538 02/27/17 0500  Weight: 59.8 kg (131 lb 14.4 oz) 57.2 kg (126 lb 3.2 oz) 55.5 kg (122 lb 4.8 oz)    Lab Results: Basic Metabolic Panel:  Recent Labs  02/26/17 0529 02/27/17 0455  NA 137 135  K 3.8 4.7  CL 96* 93*  CO2 33* 33*  GLUCOSE 101* 198*  BUN 29* 40*  CREATININE 1.25* 1.38*   CBC:  Recent Labs  02/26/17 0529 02/27/17 0455  WBC 6.7 6.4  NEUTROABS 5.7 5.4  HGB 14.1 13.2  HCT 44.8 41.9  MCV 90.9 89.5  PLT 123* 135*   Cardiac Enzymes: Troponin (Point of Care Test)  Recent Labs  02/24/17 1947  TROPIPOC 0.10*   Cardiac Panel (last 3 results)  Recent Labs  02/25/17 0438 02/25/17 1045  TROPONINI 0.15* 0.13*    Telemetry: Reviewed -Normal sinus rhythm  Assessment/Plan:  1.  Cor pulmonale with moderate severe pulmonary hypertension 2.  Stage II to 3 chronic kidney disease 3.  Pulmonary fibrosis 4.  Mild aortic stenosis  Recommendations:  Volume overload appears to be improving with weight loss.  Appears to have severe: Probable benign.  Watch renal function and back off diuresis his creatinine climbs.  Would reserve catheterization for refractory  symptoms.     Kerry Hough  MD [facility] Cardiology  02/27/2017, 12:04 PM

## 2017-02-27 NOTE — Evaluation (Signed)
Physical Therapy Evaluation Patient Details Name: Gilbert Dominguez MRN: [id number] DOB: 03/09/1946 Today's Date: 02/27/2017   History of Present Illness  Gilbert Dominguez is a 71 y.o. male with medical history significant of pulmonary fibrosis, chronic respiratory failure oxygen dependent of 2 L, HTN,  HLD, RA; who presents with complaints of shortness of breath progressively worsening over the last 3 days.  Working dx in diastolic HF.  3/20 pt's breathin worsened in hospital and pt placed on Bipap.  Clinical Impression  Pt admitted with/for progressively worsening SOB, that continues to pose a problem requiring 8-10 L for mobility.  Pt currently limited functionally due to the problems listed below.  (see problems list.)  Pt will benefit from PT to maximize function and safety to be able to get home safely with available assist.     Follow Up Recommendations Home health PT;Other (comment) (until can get back to pulm rehab.)    Equipment Recommendations  None recommended by PT    Recommendations for Other Services       Precautions / Restrictions Precautions Precautions: Fall      Mobility  Bed Mobility Overal bed mobility: Modified Independent                Transfers Overall transfer level: Needs assistance   Transfers: Sit to/from Stand Sit to Stand: Supervision         General transfer comment: no instability  Ambulation/Gait Ambulation/Gait assistance: Min guard Ambulation Distance (Feet): 70 Feet (x2) Assistive device: None (pulling port O2 and carrying monitor) Gait Pattern/deviations: Step-through pattern Gait velocity: slower Gait velocity interpretation: Below normal speed for age/gender General Gait Details: Started on 8L HFNC, at ~70 feet, pt fatiguing and lips getting blue.  On sitting, sats were at 63% and EHR in the 100's bpm.  Increased O2 to 10 L when sats not rising, then 90% in 2-3 minutes with pursed-lip breathing.  Returned on 10 L HFNC with  sats dropping to 73% and EHR 91 bpm with quick return to >90%.  Pt generally steady until fatigue, but no LOB  Stairs            Wheelchair Mobility    Modified Rankin (Stroke Patients Only)       Balance Overall balance assessment: Needs assistance   Sitting balance-Leahy Scale: Fair       Standing balance-Leahy Scale: Fair                               Pertinent Vitals/Pain Pain Assessment: No/denies pain    Home Living Family/patient expects to be discharged to:: Private residence Living Arrangements: Spouse/significant other Available Help at Discharge: Family;Available PRN/intermittently Type of Home: House Home Access: Stairs to enter   Entrance Stairs-Number of Steps: 1 Home Layout: One level   Additional Comments: wife is on HD.    Prior Function Level of Independence: Independent         Comments: Pt goes to pulmonary rehab 2x/wk. Also involved in several volunteer activities. Used O2. 3L at rest. 3-8L with activity.     Hand Dominance        Extremity/Trunk Assessment        Lower Extremity Assessment Lower Extremity Assessment: Overall WFL for tasks assessed       Communication   Communication: HOH  Cognition Arousal/Alertness: Awake/alert Behavior During Therapy: WFL for tasks assessed/performed Overall Cognitive Status: Within Functional Limits for tasks assessed  General Comments      Exercises     Assessment/Plan    PT Assessment Patient needs continued PT services  PT Problem List Decreased activity tolerance;Decreased balance;Decreased mobility;Decreased knowledge of use of DME;Cardiopulmonary status limiting activity       PT Treatment Interventions Gait training;Functional mobility training;Therapeutic activities;Balance training;Patient/family education    PT Goals (Current goals can be found in the Care Plan section)  Acute Rehab PT  Goals Patient Stated Goal: Get back to mobility while keeping up my oxygen PT Goal Formulation: With patient Time For Goal Achievement: 03/13/17 Potential to Achieve Goals: Good    Frequency Min 3X/week   Barriers to discharge        Co-evaluation               AM-PAC PT "6 Clicks" Daily Activity  Outcome Measure Difficulty turning over in bed (including adjusting bedclothes, sheets and blankets)?: None Difficulty moving from lying on back to sitting on the side of the bed? : None Difficulty sitting down on and standing up from a chair with arms (e.g., wheelchair, bedside commode, etc,.)?: A Little Help needed moving to and from a bed to chair (including a wheelchair)?: A Little Help needed walking in hospital room?: A Little Help needed climbing 3-5 steps with a railing? : A Little 6 Click Score: 20    End of Session Equipment Utilized During Treatment: Oxygen Activity Tolerance: Patient tolerated treatment well;Patient limited by fatigue Patient left: in bed;with call bell/phone within reach;with bed alarm set Nurse Communication: Mobility status PT Visit Diagnosis: Difficulty in walking, not elsewhere classified (R26.2);Unsteadiness on feet (R26.81)    Time: 5681-2751 PT Time Calculation (min) (ACUTE ONLY): 35 min   Charges:   PT Evaluation $PT Eval Moderate Complexity: 1 Procedure PT Treatments $Gait Training: 8-22 mins   PT G Codes:        Mar 06, 2017  Donnella Sham, PT [phone]  (pager)  [name] 03/06/17, 12:32 PM

## 2017-02-28 LAB — CBC WITH DIFFERENTIAL/PLATELET
BASOS PCT: 0 %
Basophils Absolute: 0 10*3/uL (ref 0.0–0.1)
EOS PCT: 0 %
Eosinophils Absolute: 0 10*3/uL (ref 0.0–0.7)
HCT: 41.4 % (ref 39.0–52.0)
Hemoglobin: 13.8 g/dL (ref 13.0–17.0)
LYMPHS ABS: 0.5 10*3/uL — AB (ref 0.7–4.0)
Lymphocytes Relative: 6 %
MCH: 29.4 pg (ref 26.0–34.0)
MCHC: 33.3 g/dL (ref 30.0–36.0)
MCV: 88.3 fL (ref 78.0–100.0)
MONO ABS: 0.6 10*3/uL (ref 0.1–1.0)
Monocytes Relative: 7 %
NEUTROS ABS: 7.1 10*3/uL (ref 1.7–7.7)
NEUTROS PCT: 87 %
PLATELETS: 140 10*3/uL — AB (ref 150–400)
RBC: 4.69 MIL/uL (ref 4.22–5.81)
RDW: 16.8 % — ABNORMAL HIGH (ref 11.5–15.5)
WBC: 8.2 10*3/uL (ref 4.0–10.5)

## 2017-02-28 LAB — BASIC METABOLIC PANEL
ANION GAP: 10 (ref 5–15)
Anion gap: 11 (ref 5–15)
BUN: 52 mg/dL — AB (ref 6–20)
BUN: 55 mg/dL — ABNORMAL HIGH (ref 6–20)
CO2: 33 mmol/L — ABNORMAL HIGH (ref 22–32)
CO2: 34 mmol/L — ABNORMAL HIGH (ref 22–32)
CREATININE: 1.34 mg/dL — AB (ref 0.61–1.24)
Calcium: 8.9 mg/dL (ref 8.9–10.3)
Calcium: 8.8 mg/dL — ABNORMAL LOW (ref 8.9–10.3)
Chloride: 89 mmol/L — ABNORMAL LOW (ref 101–111)
Chloride: 87 mmol/L — ABNORMAL LOW (ref 101–111)
Creatinine, Ser: 1.79 mg/dL — ABNORMAL HIGH (ref 0.61–1.24)
GFR calc Af Amer: 59 mL/min — ABNORMAL LOW (ref >60)
GFR, EST AFRICAN AMERICAN: 42 mL/min — AB (ref >60)
GFR, EST NON AFRICAN AMERICAN: 51 mL/min — AB (ref >60)
GFR, EST NON AFRICAN AMERICAN: 36 mL/min — AB (ref >60)
GLUCOSE: 300 mg/dL — AB (ref 65–99)
GLUCOSE: 693 mg/dL — AB (ref 65–99)
POTASSIUM: 4.7 mmol/L (ref 3.5–5.1)
Potassium: 4.5 mmol/L (ref 3.5–5.1)
SODIUM: 133 mmol/L — AB (ref 135–145)
Sodium: 131 mmol/L — ABNORMAL LOW (ref 135–145)

## 2017-02-28 LAB — PROCALCITONIN: Procalcitonin: 1.21 ng/mL

## 2017-02-28 LAB — GLUCOSE, CAPILLARY

## 2017-02-28 LAB — MAGNESIUM: Magnesium: 2.3 mg/dL (ref 1.7–2.4)

## 2017-02-28 MED ORDER — SODIUM CHLORIDE 0.9 % IV SOLN
INTRAVENOUS | Status: DC
  Administered 2017-02-28: 23:00:00 via INTRAVENOUS

## 2017-02-28 MED ORDER — SODIUM CHLORIDE 0.9 % IV SOLN
INTRAVENOUS | Status: DC
  Administered 2017-03-01: via INTRAVENOUS

## 2017-02-28 MED ORDER — AZITHROMYCIN 250 MG PO TABS
500.0000 mg | ORAL_TABLET | ORAL | Status: DC
  Administered 2017-02-28 – 2017-03-03 (×4): 500 mg via ORAL
  Filled 2017-02-28 (×4): qty 2

## 2017-02-28 MED ORDER — INSULIN ASPART 100 UNIT/ML ~~LOC~~ SOLN: 0.0000 [IU] | Freq: Three times a day (TID) | SUBCUTANEOUS | Status: DC

## 2017-02-28 MED ORDER — DEXTROSE-NACL 5-0.45 % IV SOLN
INTRAVENOUS | Status: DC
  Administered 2017-03-01: 03:00:00 via INTRAVENOUS

## 2017-02-28 MED ORDER — SODIUM CHLORIDE 0.9 % IV SOLN
INTRAVENOUS | Status: DC
  Administered 2017-02-28: 23:00:00 via INTRAVENOUS
  Filled 2017-02-28: qty 1

## 2017-02-28 MED ORDER — INSULIN ASPART 100 UNIT/ML ~~LOC~~ SOLN: 0.0000 [IU] | Freq: Every day | SUBCUTANEOUS | Status: DC

## 2017-02-28 NOTE — Progress Notes (Signed)
Patient ID: Gilbert Dominguez, male   DOB: 1946-04-22, 71 y.o.   MRN: [id number]  PROGRESS NOTE    Cambridge Deleo  OEV:[id number] DOB: 02/11/1946 DOA: 02/24/2017 PCP: Leonard Downing, MD   Brief Narrative:  71 year old male with medical history of pulmonary fibrosis, chronic hypoxic respiratory failure on home oxygen 2 L at rest and up to 8 L on exertion, hypertension, dyslipidemia, RA presented with shortness of breath progressively worsening and significant weight gain. He was admitted with acute and chronic hypoxic respiratory failure and acute diastolic CHF exacerbation and positive troponins, started on Lasix intravenously. His oxygen requirement worsened so he was transferred to stepdown unit. He was evaluated by pulmonary and started on intravenous steroids and antibiotics. Cardiology has also evaluated the patient and patient is continued on IV Lasix. 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 Acute Diastolic CHF exacerbation:. - Continue with Lasix 40 mg IV twice a day. Strict input and output, daily weight. Monitor creatinine. - Follow further recommendations from cardiology.   2. Acute on chronic respiratory failure with hypoxia withpulmonary fibrosisand pulmonary artery hypertension:  - Patient's respiratory status is improved. Continue with oxygen supplementation. Follow further recommendations from pulmonary. - Continue with Solu-Medrol and Rocephin and Zithromax. Cultures have been negative so far. Probably start tapering Solu Medrol from tomorrow. - Respiratory panel  was positive for metapneumovirus. -Continue Duonebs - Home prednisone has been discontinued  3. Elevated troponin:probably secondary to above  4. Chronic kidney disease stage III: Creatinine stable, repeat BMP in a.m.   5. Essential hypertension: Monitor blood pressure; continue Lasix and Terazosin  6. Hyperlipidemia - Continue Lipitor   7. GERD - Continue Pepcid and Protonix   8. Hypokalemia: Improved    DVT prophylaxis:Lovenox Code Status:Full Family Communication:None present at bedside Disposition Plan:depends on condition improvement  Consultants:Cardiology Pulmonary  Procedures:None Antimicrobials: Rocephin and Zithromax from 02/26/2017    Subjective: Patient seen and examined at bedside. He feels much better today. His breathing is improving. No overnight chest pain, fever, nausea, vomiting.  Objective: Vitals:   02/28/17 0419 02/28/17 0754 02/28/17 1048 02/28/17 1159  BP: (!) 121/93 121/84  115/88  Pulse: 64 68  (!) 47  Resp: 14 (!) 22  14  Temp: 97.7 F (36.5 C) 98 F (36.7 C)  98.1 F (36.7 C)  TempSrc:  Oral  Oral  SpO2: 97% 98% 98% 97%  Weight: 55.2 kg (121 lb 12.8 oz)     Height:        Intake/Output Summary (Last 24 hours) at 02/28/17 1259 Last data filed at 02/28/17 0845  Gross per 24 hour  Intake             1050 ml  Output             2475 ml  Net            -1425 ml   Filed Weights   02/26/17 0538 02/27/17 0500 02/28/17 0419  Weight: 57.2 kg (126 lb 3.2 oz) 55.5 kg (122 lb 4.8 oz) 55.2 kg (121 lb 12.8 oz)    Examination:  General exam: Appears calm and comfortable  Respiratory system: Bilateral decreased breath sound at bases With scattered crackles Cardiovascular system: S1 & S2 heard, intermittent bradycardia  Gastrointestinal system: Abdomen is nondistended, soft and nontender. No organomegaly or masses felt. Normal bowel sounds heard. Extremities: No cyanosis, clubbing, 1+ pitting edema     Data  Reviewed: I have personally reviewed following labs and imaging studies  CBC:  Recent Labs Lab 02/24/17 1939 02/25/17 0438 02/26/17 0529 02/27/17 0455 02/28/17 0700  WBC 8.7 10.1 6.7 6.4 8.2  NEUTROABS 7.7 8.8* 5.7 5.4 7.1  HGB 14.9 13.9 14.1 13.2 13.8  HCT 44.4 42.5 44.8 41.9 41.4  MCV 87.6 87.6 90.9 89.5 88.3  PLT 170 160 123* 135* 919*   Basic Metabolic Panel:  Recent Labs Lab 02/24/17 1939 02/25/17 0438 02/26/17 0529 02/27/17 0455 02/28/17 0700  NA 135 138 137 135 133*  K 4.1 3.2* 3.8 4.7 4.5  CL 101 100* 96* 93* 89*  CO2 26 28 33* 33* 33*  GLUCOSE 121* 113* 101* 198* 300*  BUN 35* 26* 29* 40* 52*  CREATININE 1.51* 1.25* 1.25* 1.38* 1.34*  CALCIUM 9.0 8.8* 9.1 9.0 8.9  MG  --   --  2.0 2.2 2.3   GFR: Estimated Creatinine Clearance: 38.9 mL/min (A) (by C-G formula based on SCr of 1.34 mg/dL (H)). Liver Function Tests:  Recent Labs Lab 02/27/17 0455  AST 537*  ALT 286*  ALKPHOS 129*  BILITOT 0.9  PROT 5.3*  ALBUMIN 2.5*   No results for input(s): LIPASE, AMYLASE in the last 168 hours. No results for input(s): AMMONIA in the last 168 hours. Coagulation Profile: No results for input(s): INR, PROTIME in the last 168 hours. Cardiac Enzymes:  Recent Labs Lab 02/25/17 0438 02/25/17 1045  TROPONINI 0.15* 0.13*   BNP (last 3 results)  Recent Labs  01/20/17 1642  PROBNP 1,867.0*   HbA1C: No results for input(s): HGBA1C in the last 72 hours. CBG:  Recent Labs Lab 02/24/17 2335  GLUCAP 126*   Lipid Profile: No results for input(s): CHOL, HDL, LDLCALC, TRIG, CHOLHDL, LDLDIRECT in the last 72 hours. Thyroid Function Tests: No results for input(s): TSH, T4TOTAL, FREET4, T3FREE, THYROIDAB in the last 72 hours. Anemia Panel: No results for input(s): VITAMINB12, FOLATE, FERRITIN, TIBC, IRON, RETICCTPCT in the last 72 hours. Sepsis Labs:  Recent Labs Lab 02/26/17 1310 02/27/17 0455 02/28/17 0700  PROCALCITON 0.67 1.66 1.21    Recent  Results (from the past 240 hour(s))  Respiratory Panel by PCR     Status: Abnormal   Collection Time: 02/24/17 11:59 PM  Result Value Ref Range Status   Adenovirus NOT DETECTED NOT DETECTED Final   Coronavirus 229E NOT DETECTED NOT DETECTED Final   Coronavirus HKU1 NOT DETECTED NOT DETECTED Final   Coronavirus NL63 NOT DETECTED NOT DETECTED Final   Coronavirus OC43 NOT DETECTED NOT DETECTED Final   Metapneumovirus DETECTED (A) NOT DETECTED Final   Rhinovirus / Enterovirus NOT DETECTED NOT DETECTED Final   Influenza A NOT DETECTED NOT DETECTED Final   Influenza B NOT DETECTED NOT DETECTED Final   Parainfluenza Virus 1 NOT DETECTED NOT DETECTED Final   Parainfluenza Virus 2 NOT DETECTED NOT DETECTED Final   Parainfluenza Virus 3 NOT DETECTED NOT DETECTED Final   Parainfluenza Virus 4 NOT DETECTED NOT DETECTED Final   Respiratory Syncytial Virus NOT DETECTED NOT DETECTED Final   Bordetella pertussis NOT DETECTED NOT DETECTED Final   Chlamydophila pneumoniae NOT DETECTED NOT DETECTED Final  Mycoplasma pneumoniae NOT DETECTED NOT DETECTED Final  Culture, blood (routine x 2)     Status: None (Preliminary result)   Collection Time: 02/26/17  1:10 PM  Result Value Ref Range Status   Specimen Description BLOOD LEFT WRIST  Final   Special Requests   Final    BOTTLES DRAWN AEROBIC AND ANAEROBIC Blood Culture results may not be optimal due to an excessive volume of blood received in culture bottles   Culture NO GROWTH 2 DAYS  Final   Report Status PENDING  Incomplete  Culture, blood (routine x 2)     Status: None (Preliminary result)   Collection Time: 02/26/17  1:11 PM  Result Value Ref Range Status   Specimen Description BLOOD RIGHT WRIST  Final   Special Requests   Final    BOTTLES DRAWN AEROBIC AND ANAEROBIC Blood Culture results may not be optimal due to an excessive volume of blood received in culture bottles   Culture NO GROWTH 2 DAYS  Final   Report Status PENDING  Incomplete  MRSA  PCR Screening     Status: None   Collection Time: 02/27/17  8:32 PM  Result Value Ref Range Status   MRSA by PCR NEGATIVE NEGATIVE Final    Comment:        The GeneXpert MRSA Assay (FDA approved for NASAL specimens only), is one component of a comprehensive MRSA colonization surveillance program. It is not intended to diagnose MRSA infection nor to guide or monitor treatment for MRSA infections.          Radiology Studies: No results found.      Scheduled Meds: . aspirin EC  81 mg Oral Daily  . atorvastatin  20 mg Oral Daily  . azithromycin  500 mg Oral Q24H  . enoxaparin (LOVENOX) injection  40 mg Subcutaneous Q24H  . famotidine  20 mg Oral QHS  . feeding supplement (ENSURE ENLIVE)  237 mL Oral BID BM  . furosemide  40 mg Intravenous BID  . guaiFENesin  1,200 mg Oral BID  . loratadine  10 mg Oral Daily  . mouth rinse  15 mL Mouth Rinse BID  . methylPREDNISolone (SOLU-MEDROL) injection  80 mg Intravenous Q6H  . pantoprazole  40 mg Oral QAC breakfast  . potassium chloride SA  20 mEq Oral Daily  . sodium chloride flush  3 mL Intravenous Q12H  . terazosin  2 mg Oral QHS   Continuous Infusions: . sodium chloride    . cefTRIAXone (ROCEPHIN)  IV Stopped (02/27/17 1730)     LOS: 4 days        Aline August, MD [facility] Pager [phone]  If 7PM-7AM, please contact night-coverage [email or web address] Password [facility] 02/28/2017, 12:59 PM

## 2017-02-28 NOTE — Progress Notes (Signed)
Patient is tolerating 6L nasal cannula well at this time. No respiratory distress noted. BIPAP not indicated at this time.

## 2017-02-28 NOTE — Progress Notes (Signed)
Subjective:  No complaints of chest pain.  Breathing better but still desaturate's with exercise.  Weight is down 5 pounds.  Still mildly edematous.  Objective:  Vital Signs in the last 24 hours: BP 121/84 (BP Location: Right Arm)   Pulse 68   Temp 98 F (36.7 C) (Oral)   Resp (!) 22   Ht _0  (1.6 m)   Wt 55.2 kg (121 lb 12.8 oz)   SpO2 98%   BMI 21.58 kg/m   Physical Exam: Pleasant male in no acute distress wearing oxygen. Lungs: Crackles heard in both bases bilaterally  Cardiac:  Regular rhythm, normal S1 and S2, no S3, 1/6 systolic murmur Extremities: 1+ edema present  Intake/Output from previous day: 05/12 0701 - 05/13 0700 In: 1410 [P.O.:1110; IV Piggyback:300] Out: 3650 [Urine:3650]  Weight Filed Weights   02/26/17 0538 02/27/17 0500 02/28/17 0419  Weight: 57.2 kg (126 lb 3.2 oz) 55.5 kg (122 lb 4.8 oz) 55.2 kg (121 lb 12.8 oz)    Lab Results: Basic Metabolic Panel:  Recent Labs  02/27/17 0455 02/28/17 0700  NA 135 133*  K 4.7 4.5  CL 93* 89*  CO2 33* 33*  GLUCOSE 198* 300*  BUN 40* 52*  CREATININE 1.38* 1.34*   CBC:  Recent Labs  02/27/17 0455 02/28/17 0700  WBC 6.4 8.2  NEUTROABS 5.4 7.1  HGB 13.2 13.8  HCT 41.9 41.4  MCV 89.5 88.3  PLT 135* 140*   Cardiac Enzymes:   Recent Labs  02/25/17 1045  TROPONINI 0.13*    Telemetry: Reviewed -Normal sinus rhythm  Assessment/Plan:  1.  Cor pulmonale with moderate severe pulmonary hypertension 2.  Stage II to 3 chronic kidney disease 3.  Pulmonary fibrosis 4.  Mild aortic stenosis  Recommendations:  Still mildly edematous.  He is diuresing but still is significantly hypoxemic.  I suspect that this is predominantly pulmonary with right heart failure.  Will defer decision for whether he needs catheterization to his primary team.  Still a lot of work to be done with hypoxemia and pulmonary situation.     Kerry Hough  MD [facility]  02/28/2017, 10:25 AM

## 2017-02-28 NOTE — Evaluation (Signed)
Occupational Therapy Evaluation and Discharge  Patient Details Name: Gilbert Dominguez MRN: [id number] DOB: 16-Feb-1946 Today's Date: 02/28/2017    History of Present Illness Gilbert Dominguez is a 71 y.o. male with medical history significant of pulmonary fibrosis, chronic respiratory failure oxygen dependent of 2 L, HTN,  HLD, RA; who presents with complaints of shortness of breath progressively worsening over the last 3 days.  Working dx in diastolic HF.  4/49 pt's breathin worsened in hospital and pt placed on Bipap.   Clinical Impression   Pt reports he was independent with ADL PTA. Currently pt overall supervision for ADL and functional mobility; quickly approaching mod I. SpO2 down to high 70s on 10L HFNC with activity; educated on pursed lip breathing and with standing rest SpO2 in high 80s, with seated rest SpO2 in low 90s. Educated on energy conservation strategies and encouraged mobility with Conservation officer, historic buildings. Pt planning to d/c home with supervision from family. No further acute OT needs identified; signing off at this time. Please re-consult if needs change. Thank you for this referral.    Follow Up Recommendations  No OT follow up;Supervision - Intermittent (return to pulmonary rehab)    Equipment Recommendations  None recommended by OT    Recommendations for Other Services       Precautions / Restrictions Precautions Precautions: Fall Precaution Comments: watch O2 stats Restrictions Weight Bearing Restrictions: No      Mobility Bed Mobility Overal bed mobility: Modified Independent                Transfers Overall transfer level: Needs assistance Equipment used: None Transfers: Sit to/from Stand Sit to Stand: Supervision         General transfer comment: for safety, no unsteadiness    Balance Overall balance assessment: No apparent balance deficits (not formally assessed)                                         ADL either performed or  assessed with clinical judgement   ADL Overall ADL's : Needs assistance/impaired Eating/Feeding: Independent;Sitting   Grooming: Supervision/safety;Standing   Upper Body Bathing: Set up;Sitting   Lower Body Bathing: Supervison/ safety;Sit to/from stand   Upper Body Dressing : Set up;Sitting   Lower Body Dressing: Supervision/safety;Sit to/from stand   Toilet Transfer: Supervision/safety;Ambulation;Regular Toilet           Functional mobility during ADLs: Supervision/safety General ADL Comments: SpO2 down to high 70s on 10L HFNC with activity; educated on pursed lip breathing and with standing rest SpO2 in high 80s, with seated rest SpO2 in low 90s. Educated on energy conservation strategies.     Vision         Perception     Praxis      Pertinent Vitals/Pain Pain Assessment: No/denies pain     Hand Dominance     Extremity/Trunk Assessment Upper Extremity Assessment Upper Extremity Assessment: Overall WFL for tasks assessed   Lower Extremity Assessment Lower Extremity Assessment: Defer to PT evaluation   Cervical / Trunk Assessment Cervical / Trunk Assessment: Normal   Communication Communication Communication: HOH   Cognition Arousal/Alertness: Awake/alert Behavior During Therapy: WFL for tasks assessed/performed Overall Cognitive Status: Within Functional Limits for tasks assessed  General Comments       Exercises     Shoulder Instructions      Home Living Family/patient expects to be discharged to:: Private residence Living Arrangements: Spouse/significant other Available Help at Discharge: Family;Available PRN/intermittently Type of Home: House Home Access: Stairs to enter [employer] of Steps: 1   Home Layout: One level     Bathroom Shower/Tub: Occupational psychologist: Handicapped height     Home Equipment: Shower seat;Grab bars - tub/shower   Additional  Comments: wife is on HD; pt typically drives wife to HD      Prior Functioning/Environment Level of Independence: Independent        Comments: Pt goes to pulmonary rehab 2x/wk. Also involved in several volunteer activities. Used O2. 3L at rest. 3-8L with activity.        OT Problem List: Cardiopulmonary status limiting activity      OT Treatment/Interventions:      OT Goals(Current goals can be found in the care plan section) Acute Rehab OT Goals Patient Stated Goal: Get back to mobility while keeping up my oxygen OT Goal Formulation: All assessment and education complete, DC therapy  OT Frequency:     Barriers to D/C:            Co-evaluation              AM-PAC PT "6 Clicks" Daily Activity     Outcome Measure Help from another person eating meals?: None Help from another person taking care of personal grooming?: None Help from another person toileting, which includes using toliet, bedpan, or urinal?: A Little Help from another person bathing (including washing, rinsing, drying)?: A Little Help from another person to put on and taking off regular upper body clothing?: None Help from another person to put on and taking off regular lower body clothing?: A Little 6 Click Score: 21   End of Session Equipment Utilized During Treatment: Oxygen Nurse Communication: Mobility status;Other (comment) (SpO2 with mobility)  Activity Tolerance: Patient tolerated treatment well Patient left: in bed;with call bell/phone within reach;with family/visitor present (sitting EOB)  OT Visit Diagnosis: Unsteadiness on feet (R26.81)                Time: 5537-4827 OT Time Calculation (min): 23 min Charges:  OT General Charges $OT Visit: 1 Procedure OT Evaluation $OT Eval Moderate Complexity: 1 Procedure OT Treatments $Self Care/Home Management : 8-22 mins G-Codes:     Janelle Spellman A. Ulice Brilliant, M.S., OTR/L Pager: [phone] 02/28/2017, 2:50 PM

## 2017-02-28 NOTE — Progress Notes (Signed)
Attempted to wean or decrease pt oxygen usage today. Able to wean him down to 4 LPM per nasal cannula. Oxygen saturation had been in the 95-99% range at 4 liters o2. Pt 02 saturation had dipped to 85% while eating his food, had increased pt oxygen flow back to 6 LPM high flow. Observed that when pt is crunched in his bed his o2 saturation decreases. Had advised pt to keep himself propped up no lower than 45 degrees.Pace himself when he is talking to anybody. Incoming RN  aware, pt oxygen saturation at shift change was at 100%

## 2017-03-01 DIAGNOSIS — R0600 Dyspnea, unspecified: Secondary | ICD-10-CM

## 2017-03-01 DIAGNOSIS — J8410 Pulmonary fibrosis, unspecified: Secondary | ICD-10-CM

## 2017-03-01 DIAGNOSIS — I509 Heart failure, unspecified: Secondary | ICD-10-CM

## 2017-03-01 LAB — CBC WITH DIFFERENTIAL/PLATELET
BASOS ABS: 0 10*3/uL (ref 0.0–0.1)
Basophils Relative: 0 %
Eosinophils Absolute: 0 10*3/uL (ref 0.0–0.7)
Eosinophils Relative: 0 %
HEMATOCRIT: 40 % (ref 39.0–52.0)
Hemoglobin: 12.9 g/dL — ABNORMAL LOW (ref 13.0–17.0)
LYMPHS ABS: 0.6 10*3/uL — AB (ref 0.7–4.0)
Lymphocytes Relative: 8 %
MCH: 28.7 pg (ref 26.0–34.0)
MCHC: 32.3 g/dL (ref 30.0–36.0)
MCV: 88.9 fL (ref 78.0–100.0)
MONOS PCT: 2 %
Monocytes Absolute: 0.2 10*3/uL (ref 0.1–1.0)
Neutro Abs: 7.3 10*3/uL (ref 1.7–7.7)
Neutrophils Relative %: 90 %
Platelets: 138 10*3/uL — ABNORMAL LOW (ref 150–400)
RBC: 4.5 MIL/uL (ref 4.22–5.81)
RDW: 16.6 % — AB (ref 11.5–15.5)
WBC: 8.1 10*3/uL (ref 4.0–10.5)

## 2017-03-01 LAB — BASIC METABOLIC PANEL
Anion gap: 10 (ref 5–15)
Anion gap: 12 (ref 5–15)
BUN: 52 mg/dL — AB (ref 6–20)
BUN: 48 mg/dL — AB (ref 6–20)
CHLORIDE: 98 mmol/L — AB (ref 101–111)
CO2: 31 mmol/L (ref 22–32)
CO2: 32 mmol/L (ref 22–32)
CREATININE: 1.05 mg/dL (ref 0.61–1.24)
Calcium: 8.5 mg/dL — ABNORMAL LOW (ref 8.9–10.3)
Calcium: 8.6 mg/dL — ABNORMAL LOW (ref 8.9–10.3)
Chloride: 96 mmol/L — ABNORMAL LOW (ref 101–111)
Creatinine, Ser: 1.25 mg/dL — ABNORMAL HIGH (ref 0.61–1.24)
GFR calc Af Amer: >60 mL/min (ref >60)
GFR calc Af Amer: >60 mL/min (ref >60)
GFR, EST NON AFRICAN AMERICAN: 56 mL/min — AB (ref >60)
GLUCOSE: 95 mg/dL (ref 65–99)
Glucose, Bld: 307 mg/dL — ABNORMAL HIGH (ref 65–99)
POTASSIUM: 3.7 mmol/L (ref 3.5–5.1)
Potassium: 3.7 mmol/L (ref 3.5–5.1)
SODIUM: 142 mmol/L (ref 135–145)
Sodium: 137 mmol/L (ref 135–145)

## 2017-03-01 LAB — GLUCOSE, CAPILLARY
GLUCOSE-CAPILLARY: 120 mg/dL — AB (ref 65–99)
GLUCOSE-CAPILLARY: 141 mg/dL — AB (ref 65–99)
GLUCOSE-CAPILLARY: 166 mg/dL — AB (ref 65–99)
GLUCOSE-CAPILLARY: 146 mg/dL — AB (ref 65–99)
GLUCOSE-CAPILLARY: 120 mg/dL — AB (ref 65–99)
GLUCOSE-CAPILLARY: 135 mg/dL — AB (ref 65–99)
GLUCOSE-CAPILLARY: 170 mg/dL — AB (ref 65–99)
GLUCOSE-CAPILLARY: 285 mg/dL — AB (ref 65–99)
Glucose-Capillary: 115 mg/dL — ABNORMAL HIGH (ref 65–99)
Glucose-Capillary: 181 mg/dL — ABNORMAL HIGH (ref 65–99)
Glucose-Capillary: 200 mg/dL — ABNORMAL HIGH (ref 65–99)
Glucose-Capillary: 249 mg/dL — ABNORMAL HIGH (ref 65–99)
Glucose-Capillary: 335 mg/dL — ABNORMAL HIGH (ref 65–99)
Glucose-Capillary: 541 mg/dL (ref 65–99)

## 2017-03-01 LAB — MAGNESIUM: Magnesium: 2.5 mg/dL — ABNORMAL HIGH (ref 1.7–2.4)

## 2017-03-01 MED ORDER — METHYLPREDNISOLONE SODIUM SUCC 125 MG IJ SOLR: 60.0000 mg | Freq: Three times a day (TID) | INTRAMUSCULAR | Status: DC

## 2017-03-01 MED ORDER — METHYLPREDNISOLONE SODIUM SUCC 40 MG IJ SOLR
40.0000 mg | Freq: Every day | INTRAMUSCULAR | Status: DC
  Administered 2017-03-02: 40 mg via INTRAVENOUS
  Filled 2017-03-01: qty 1

## 2017-03-01 MED ORDER — GLUCERNA SHAKE PO LIQD
237.0000 mL | Freq: Three times a day (TID) | ORAL | Status: DC
  Administered 2017-03-02 – 2017-03-03 (×2): 237 mL via ORAL

## 2017-03-01 MED ORDER — FUROSEMIDE 10 MG/ML IJ SOLN
40.0000 mg | Freq: Two times a day (BID) | INTRAMUSCULAR | Status: DC
  Administered 2017-03-01 – 2017-03-02 (×4): 40 mg via INTRAVENOUS
  Filled 2017-03-01 (×4): qty 4

## 2017-03-01 MED ORDER — INSULIN GLARGINE 100 UNIT/ML ~~LOC~~ SOLN
10.0000 [IU] | Freq: Every day | SUBCUTANEOUS | Status: DC
  Administered 2017-03-01 – 2017-03-03 (×3): 10 [IU] via SUBCUTANEOUS
  Filled 2017-03-01 (×4): qty 0.1

## 2017-03-01 MED ORDER — INSULIN ASPART 100 UNIT/ML ~~LOC~~ SOLN
0.0000 [IU] | Freq: Three times a day (TID) | SUBCUTANEOUS | Status: DC
Start: 2017-03-01 — End: 2017-03-04
  Administered 2017-03-01: 1 [IU] via SUBCUTANEOUS
  Administered 2017-03-01: 2 [IU] via SUBCUTANEOUS
  Administered 2017-03-02 (×2): 7 [IU] via SUBCUTANEOUS

## 2017-03-01 MED ORDER — INSULIN ASPART 100 UNIT/ML ~~LOC~~ SOLN
0.0000 [IU] | Freq: Every day | SUBCUTANEOUS | Status: DC
  Administered 2017-03-01: 3 [IU] via SUBCUTANEOUS
  Administered 2017-03-03: 2 [IU] via SUBCUTANEOUS

## 2017-03-01 NOTE — Progress Notes (Signed)
Inpatient Diabetes Program Recommendations  AACE/ADA: New Consensus Statement on Inpatient Glycemic Control (2015)  Target Ranges:  Prepandial:   less than 140 mg/dL      Peak postprandial:   less than 180 mg/dL (1-2 hours)      Critically ill patients:  140 - 180 mg/dL   Results for Gilbert Dominguez, Gilbert Dominguez (MRN [id number]) as of 03/01/2017 10:18  Ref. Range 02/28/2017 22:10 03/01/2017 00:10 03/01/2017 01:37 03/01/2017 02:34 03/01/2017 03:35 03/01/2017 05:17 03/01/2017 06:35 03/01/2017 07:51 03/01/2017 09:17  Glucose-Capillary Latest Ref Range: 65 - 99 mg/dL >600 (HH) 541 (HH) 335 (H) 249 (H) 181 (H) 115 (H) 120 (H) 141 (H) 166 (H)    Admit with: SOB  History: NO History of DM noted in H&P  Current Insulin Orders: IV Insulin drip     MD- Note patient started on IV Insulin drip last night at 11pm.  Lab glucose at that time was 693 mg/dl.  Patient has been getting Solumedrol 80 mg Q8 hours which is likely contributing to elevated glucose levels.  Note Hemoglobin A1c level pending.   Please consider the following SQ insulin regimen when patient ready to transition off IV Insulin drip:  1. Start Lantus 10 units daily (0.2 units/kg dosing).  Make sure to give 1st dose at least one hour before IV Insulin drip stopped.  2. Start Novolog Sensitive Correction Scale/ SSI (0-9 units) TID AC + HS     --Will follow patient during hospitalization--  Wyn Quaker RN, MSN, CDE Diabetes Coordinator Inpatient Glycemic Control Team Team Pager: [phone] (8a-5p)

## 2017-03-01 NOTE — Progress Notes (Signed)
Text paged [facility] Dr Myna Hidalgo re: patient is on steroids and is AM labs have been showing a consistent elevation of his Blood Sugar, will probably need AC/HS blood sugars with a sliding scale of coverage and he put the orders in to get a stat BS and follow protocol for sliding scale. Blood sugar was greater than 600 on the Glucometer so I text paged him with the results and put a stat lab stick to double check blood sugars. Dr Myna Hidalgo called me back and we will give him 1 liter of NSS over 1 hour and continue to monitor and will start him on an Insulin drip with BS every hour and titrate Insulin accordingly, explained to the patient what we were doing and why, will continue to monitor.

## 2017-03-01 NOTE — Progress Notes (Signed)
Progress Note  Patient Name: Gilbert Dominguez Date of Encounter: 03/01/2017  Primary Cardiologist: new - Dr. Debara Pickett    Subjective   Patient is feeling well; denies chest pain and palpitations. SOB is improving, remains on oxygen.    Inpatient Medications    Scheduled Meds: . aspirin EC  81 mg Oral Daily  . atorvastatin  20 mg Oral Daily  . azithromycin  500 mg Oral Q24H  . enoxaparin (LOVENOX) injection  40 mg Subcutaneous Q24H  . famotidine  20 mg Oral QHS  . feeding supplement (ENSURE ENLIVE)  237 mL Oral BID BM  . furosemide  40 mg Intravenous BID  . guaiFENesin  1,200 mg Oral BID  . loratadine  10 mg Oral Daily  . mouth rinse  15 mL Mouth Rinse BID  . methylPREDNISolone (SOLU-MEDROL) injection  80 mg Intravenous Q6H  . pantoprazole  40 mg Oral QAC breakfast  . potassium chloride SA  20 mEq Oral Daily  . sodium chloride flush  3 mL Intravenous Q12H  . terazosin  2 mg Oral QHS   Continuous Infusions: . sodium chloride    . sodium chloride Stopped (03/01/17 0252)  . cefTRIAXone (ROCEPHIN)  IV Stopped (02/28/17 1430)  . dextrose 5 % and 0.45% NaCl 25 mL/hr at 03/01/17 0252  . insulin (NOVOLIN-R) infusion 0.6 mL/hr at 03/01/17 0630   PRN Meds: sodium chloride, acetaminophen, ipratropium-albuterol, ondansetron (ZOFRAN) IV, sodium chloride flush   Vital Signs    Vitals:   03/01/17 0010 03/01/17 0215 03/01/17 0329 03/01/17 0802  BP: 123/82 114/73 (!) 118/96   Pulse:  (!) 58 74 97  Resp: _0 Temp:   98.6 F (37 C)   TempSrc:      SpO2:  97% 95% 95%  Weight:   123 lb (55.8 kg)   Height:        Intake/Output Summary (Last 24 hours) at 03/01/17 0848 Last data filed at 03/01/17 0700  Gross per 24 hour  Intake           1979.9 ml  Output             2875 ml  Net           -895.1 ml   Filed Weights   02/27/17 0500 02/28/17 0419 03/01/17 0329  Weight: 122 lb 4.8 oz (55.5 kg) 121 lb 12.8 oz (55.2 kg) 123 lb (55.8 kg)     Physical Exam   General:  Well developed, well nourished, male appearing in no acute distress. Head: Normocephalic, atraumatic.  Neck: Supple without bruits, no JVD. Lungs:  Resp regular and unlabored, coarse sounds throughout Heart: RRR, S1, S2, no S3, S4, or murmur; no rub. Abdomen: Soft, non-tender, non-distended with normoactive bowel sounds. No hepatomegaly. No rebound/guarding. No obvious abdominal masses. Extremities: No clubbing, cyanosis, 1+ B LE edema. Distal pedal pulses are 2+ bilaterally. Neuro: Alert and oriented X 3. Moves all extremities spontaneously. Psych: Normal affect.  Labs    Chemistry Recent Labs Lab 02/27/17 0455  02/28/17 2225 03/01/17 0207 03/01/17 0548  NA 135  < > 131* 137 142  K 4.7  < > 4.7 3.7 3.7  CL 93*  < > 87* 96* 98*  CO2 33*  < > 34* 31 32  GLUCOSE 198*  < > 693* 307* 95  BUN 40*  < > 55* 52* 48*  CREATININE 1.38*  < > 1.79* 1.25* 1.05  CALCIUM 9.0  < > 8.8* 8.5* 8.6*  PROT 5.3*  --   --   --   --   ALBUMIN 2.5*  --   --   --   --   AST 537*  --   --   --   --   ALT 286*  --   --   --   --   ALKPHOS 129*  --   --   --   --   BILITOT 0.9  --   --   --   --   GFRNONAA 50*  < > 36* 56* >60  GFRAA 57*  < > 42* >60 >60  ANIONGAP 9  < > _0 < > = values in this interval not displayed.   Hematology Recent Labs Lab 02/27/17 0455 02/28/17 0700 03/01/17 0207  WBC 6.4 8.2 8.1  RBC 4.68 4.69 4.50  HGB 13.2 13.8 12.9*  HCT 41.9 41.4 40.0  MCV 89.5 88.3 88.9  MCH 28.2 29.4 28.7  MCHC 31.5 33.3 32.3  RDW 16.8* 16.8* 16.6*  PLT 135* 140* 138*    Cardiac Enzymes Recent Labs Lab 02/25/17 0438 02/25/17 1045  TROPONINI 0.15* 0.13*    Recent Labs Lab 02/24/17 1947  TROPIPOC 0.10*     BNP Recent Labs Lab 02/24/17 1939  BNP 2,452.3*     DDimer No results for input(s): DDIMER in the last 168 hours.   Radiology    No results found.   Telemetry    Sinus rhythm with premature complexes - Personally Reviewed  ECG    No new tracings -  Personally Reviewed   Cardiac Studies   Echo 12/08/16 Study Conclusions - Left ventricle: The cavity size was normal. Systolic function was normal. The estimated ejection fraction was in the range of 55% to 60%. Wall motion was normal; there were no regional wall motion abnormalities. Doppler parameters are consistent with abnormal left ventricular relaxation (+). - Ventricular septum: D-shaped interventricular septum suggestive of RV pressure/volume overload. - Aortic valve: Trileaflet; moderately calcified leaflets. There was mild stenosis. Mean gradient (S): 10 mm Hg. Valve area (VTI): 1.52 cm^2. - Mitral valve: There was no significant regurgitation. - Right ventricle: The cavity size was severely dilated. Systolic function was moderately reduced. - Right atrium: The atrium was moderately dilated. - Tricuspid valve: Peak RV-RA gradient (S): 61 mm Hg. - Systemic veins: IVC not visualized. - Pericardium, extracardiac: A trivial pericardial effusion was identified.  Impressions: - Normal LV size with EF 55-60%. D-shaped interventricular septum suggestive of RV pressure/volume overload. Severely dilated RV with moderate systolic dysfunction. Severe pulmonary hypertension. Mild aortic stenosis.  Patient Profile     71 y.o. male [name] ( ? Exposure to agent orange) with hx of chronic respiratory failure with hypoxia (on oxygen 24/7 - followed by Dr, Melvyn Novas), RA (previously treated with MTX - followed by Dr. Charlestine Night), prior 37 pack year hx of tobacco smoking (quit 1996), pHTN, pulmonary fibrosis and CKD stage III  who is being seen today for the evaluation of acute CHF at the request of Dr. Starla Link.   Echo 11/2016 showed normal LV function to 55-60%, grade 1 DD, mild AS, D-shaped interventricular septum suggestive of RV pressure/volume overload. Severely dilated RV with moderate systolic dysfunction. Severe pulmonary hypertension (77m Hg)  Assessment  & Plan    1. Acute on chronic diastolic heart failure, echo with right heart failure - diuresing on 40 mg IV BID - wt today 123 lbs, from 133 lbs on admission; overall net negative  5.7 L with 2.8 L urine output yesterday - continue diuretics, pt still requiring O2, is on 2-8 L at home, requires 10 L while ambulating here - pt states he is breathing better, but still with LE edema   2. Acute on chronic respiratory failure with hypoxia, pulmonary fibrosis, pulmonary hypertension, human metapneumovirus - pt is using CPAP in the hospital and would like to get a CPAP for home; recommend a sleep study (staff message sent) - treatment for metapneumovirus per primary team, on ABX zithromax and rocephin   3. Elevated troponin - trend has been flat: 0.15 -->0.13, no active chest pain, no signs of ischemia - likely demand ischemia in the setting of hypoxia and right heart failure   4. CKD stage III - sCr today 1.05 (1.25), pt making adequate urine with lasix - per primary team   5. HTN - pressure has been largely controlled, sBP 110-130s, no home meds   6. HLD -continue lipitor   Signed, Ledora Bottcher , PA-C 8:48 AM 03/01/2017 Pager: [phone]  Attending Note:   The patient was seen and examined.  Agree with assessment and plan as noted above.  Changes made to the above note as needed.  Patient seen and independently examined with Doreene Adas , PA .   We discussed all aspects of the encounter. I agree with the assessment and plan as stated above.  1.  Chronic diastolic chf-  / right heart failure secondary to lung disease Has diuresed well Feels much better.  Net I/O -5.6 liters.  Is near baseline Continue with medical therapy  2. Troponin elevation:  Flat trend. Likely due to stress.     I have spent a total of 40 minutes with patient reviewing hospital  notes , telemetry, EKGs, labs and examining patient as well as establishing an assessment and plan that was  discussed with the patient. > 50% of time was spent in direct patient care.    Thayer Headings, Brooke Bonito., MD, [facility] 03/01/2017, 2:55 PM [address],  [place] Pager [phone]

## 2017-03-01 NOTE — Progress Notes (Signed)
Patient ID: Gilbert Dominguez, male   DOB: 04/18/1946, 71 y.o.   MRN: [id number]  PROGRESS NOTE    Eulice Rutledge  LGX:[id number] DOB: [birth year] DOA: 02/24/2017 PCP: Leonard Downing, MD   Brief Narrative:  71 year old male with medical history of pulmonary fibrosis, chronic hypoxic respiratory failure on home oxygen 2 L at rest and up to 8 L on exertion, hypertension, dyslipidemia, RA presented with shortness of breath progressively worsening and significant weight gain. He was admitted with acute and chronic hypoxic respiratory failure and acute diastolic CHF exacerbation and positive troponins, started on Lasix intravenously. His oxygen requirement worsened so he was transferred to stepdown unit. He was evaluated by pulmonary and started on intravenous steroids and antibiotics. Cardiology has also evaluated the patient and patient is continued on IV Lasix. His symptoms are improving but still requiring significant amount of oxygen.  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 Acute Diastolic CHF exacerbation:. - Continue with Lasix 40 mg IV twice a day. Strict input and output, daily weight. Monitor creatinine. - Follow further recommendations from cardiology.   Gross per 24 hour  Intake           2439.9 ml  Output             2725 ml  Net           -285.1 ml  Negative balance of almost 5800 mL since admission  2. Acute on chronic respiratory failure with hypoxia withpulmonary fibrosisand pulmonary artery hypertension:  - Patient's respiratory status is improved. Continue with oxygen supplementation. Follow  further recommendations from pulmonary. - Continue with Solu-Medrol and Rocephin and Zithromax. Cultures have been negative so far. Decrease Solu-Medrol to 60 mg IV every 8 hours. Repeat chest x-ray for tomorrow - Respiratory panel waspositive for metapneumovirus. -Continue Duonebs - Home prednisone has been discontinued  3. Elevated troponin:probably secondary to above  4. Chronic kidney disease stage III: Creatinine stable, repeat BMP in a.m.  5. Essential hypertension: Monitor blood pressure; continue Lasix and Terazosin  6. Hyperlipidemia - Continue Lipitor   7. GERD - Continue Pepcid and Protonix   8. Hypokalemia: Improved  9. Acute transaminitis questionable cause: repeat LFTs for tomorrow  10. Hyperglycemia: Probably reactive; continue with Accu-Cheks with coverage and long-acting insulin. Check hemoglobin A1c for tomorrow. Decrease the dose of Solu Medrol   DVT prophylaxis:Lovenox Code Status:Full Family Communication:None present at bedside Disposition Plan:depends on condition improvement  Consultants:Cardiology Pulmonary  Procedures:None Antimicrobials:Rocephin and Zithromax from 02/26/2017   Subjective: Patient seen and examined at bedside. Patient feels better. He still short of breath with minimal exertion. No overnight fever, nausea, vomiting  Objective: Vitals:   03/01/17 0010 03/01/17 0215 03/01/17 0329 03/01/17 0802  BP: 123/82 114/73 (!) 118/96   Pulse:  (!) 58 74 97  Resp: _0 Temp:   98.6 F (37 C)   TempSrc:      SpO2:  97% 95% 95%  Weight:   55.8 kg (123 lb)   Height:        Intake/Output Summary (Last 24 hours) at 03/01/17 1149 Last data  filed at 03/01/17 0923  Gross per 24 hour  Intake           2439.9 ml  Output             2725 ml  Net           -285.1 ml   Filed Weights   02/27/17 0500 02/28/17 0419 03/01/17 0329  Weight: 55.5 kg (122 lb 4.8 oz) 55.2 kg (121 lb 12.8 oz) 55.8 kg (123 lb)     Examination:  General exam: Appears calm and comfortable  Respiratory system: Bilateral decreased breath sound at bases With scattered crackles Cardiovascular system: S1 & S2 heard, rate controlled  Gastrointestinal system: Abdomen is nondistended, soft and nontender. Normal bowel sounds heard. Extremities: No cyanosis, clubbing, 1+ pitting edema    Data Reviewed: I have personally reviewed following labs and imaging studies  CBC:  Recent Labs Lab 02/25/17 0438 02/26/17 0529 02/27/17 0455 02/28/17 0700 03/01/17 0207  WBC 10.1 6.7 6.4 8.2 8.1  NEUTROABS 8.8* 5.7 5.4 7.1 7.3  HGB 13.9 14.1 13.2 13.8 12.9*  HCT 42.5 44.8 41.9 41.4 40.0  MCV 87.6 90.9 89.5 88.3 88.9  PLT 160 123* 135* 140* 785*   Basic Metabolic Panel:  Recent Labs Lab 02/26/17 0529 02/27/17 0455 02/28/17 0700 02/28/17 2225 03/01/17 0207 03/01/17 0548  NA 137 135 133* 131* 137 142  K 3.8 4.7 4.5 4.7 3.7 3.7  CL 96* 93* 89* 87* 96* 98*  CO2 33* 33* 33* 34* 31 32  GLUCOSE 101* 198* 300* 693* 307* 95  BUN 29* 40* 52* 55* 52* 48*  CREATININE 1.25* 1.38* 1.34* 1.79* 1.25* 1.05  CALCIUM 9.1 9.0 8.9 8.8* 8.5* 8.6*  MG 2.0 2.2 2.3  --  2.5*  --    GFR: Estimated Creatinine Clearance: 50.2 mL/min (by C-G formula based on SCr of 1.05 mg/dL). Liver Function Tests:  Recent Labs Lab 02/27/17 0455  AST 537*  ALT 286*  ALKPHOS 129*  BILITOT 0.9  PROT 5.3*  ALBUMIN 2.5*   No results for input(s): LIPASE, AMYLASE in the last 168 hours. No results for input(s): AMMONIA in the last 168 hours. Coagulation Profile: No results for input(s): INR, PROTIME in the last 168 hours. Cardiac Enzymes:  Recent Labs Lab 02/25/17 0438 02/25/17 1045  TROPONINI 0.15* 0.13*   BNP (last 3 results)  Recent Labs  01/20/17 1642  PROBNP 1,867.0*   HbA1C: No results for input(s): HGBA1C in the last 72 hours. CBG:  Recent Labs Lab 03/01/17 0635 03/01/17 0751 03/01/17 0917 03/01/17 1008 03/01/17 1134   GLUCAP 120* 141* 166* 146* 120*   Lipid Profile: No results for input(s): CHOL, HDL, LDLCALC, TRIG, CHOLHDL, LDLDIRECT in the last 72 hours. Thyroid Function Tests: No results for input(s): TSH, T4TOTAL, FREET4, T3FREE, THYROIDAB in the last 72 hours. Anemia Panel: No results for input(s): VITAMINB12, FOLATE, FERRITIN, TIBC, IRON, RETICCTPCT in the last 72 hours. Sepsis Labs:  Recent Labs Lab 02/26/17 1310 02/27/17 0455 02/28/17 0700  PROCALCITON 0.67 1.66 1.21    Recent Results (from the past 240 hour(s))  Respiratory Panel by PCR     Status: Abnormal   Collection Time: 02/24/17 11:59 PM  Result Value Ref Range Status   Adenovirus NOT DETECTED NOT DETECTED Final   Coronavirus 229E NOT DETECTED NOT DETECTED Final   Coronavirus HKU1 NOT DETECTED NOT DETECTED Final   Coronavirus NL63 NOT DETECTED NOT DETECTED Final   Coronavirus OC43 NOT DETECTED NOT DETECTED Final   Metapneumovirus  DETECTED (A) NOT DETECTED Final   Rhinovirus / Enterovirus NOT DETECTED NOT DETECTED Final   Influenza A NOT DETECTED NOT DETECTED Final   Influenza B NOT DETECTED NOT DETECTED Final   Parainfluenza Virus 1 NOT DETECTED NOT DETECTED Final   Parainfluenza Virus 2 NOT DETECTED NOT DETECTED Final   Parainfluenza Virus 3 NOT DETECTED NOT DETECTED Final   Parainfluenza Virus 4 NOT DETECTED NOT DETECTED Final   Respiratory Syncytial Virus NOT DETECTED NOT DETECTED Final   Bordetella pertussis NOT DETECTED NOT DETECTED Final   Chlamydophila pneumoniae NOT DETECTED NOT DETECTED Final   Mycoplasma pneumoniae NOT DETECTED NOT DETECTED Final  Culture, blood (routine x 2)     Status: None (Preliminary result)   Collection Time: 02/26/17  1:10 PM  Result Value Ref Range Status   Specimen Description BLOOD LEFT WRIST  Final   Special Requests   Final    BOTTLES DRAWN AEROBIC AND ANAEROBIC Blood Culture results may not be optimal due to an excessive volume of blood received in culture bottles   Culture NO  GROWTH 2 DAYS  Final   Report Status PENDING  Incomplete  Culture, blood (routine x 2)     Status: None (Preliminary result)   Collection Time: 02/26/17  1:11 PM  Result Value Ref Range Status   Specimen Description BLOOD RIGHT WRIST  Final   Special Requests   Final    BOTTLES DRAWN AEROBIC AND ANAEROBIC Blood Culture results may not be optimal due to an excessive volume of blood received in culture bottles   Culture NO GROWTH 2 DAYS  Final   Report Status PENDING  Incomplete  MRSA PCR Screening     Status: None   Collection Time: 02/27/17  8:32 PM  Result Value Ref Range Status   MRSA by PCR NEGATIVE NEGATIVE Final    Comment:        The GeneXpert MRSA Assay (FDA approved for NASAL specimens only), is one component of a comprehensive MRSA colonization surveillance program. It is not intended to diagnose MRSA infection nor to guide or monitor treatment for MRSA infections.          Radiology Studies: No results found.      Scheduled Meds: . aspirin EC  81 mg Oral Daily  . atorvastatin  20 mg Oral Daily  . azithromycin  500 mg Oral Q24H  . enoxaparin (LOVENOX) injection  40 mg Subcutaneous Q24H  . famotidine  20 mg Oral QHS  . feeding supplement (ENSURE ENLIVE)  237 mL Oral BID BM  . furosemide  40 mg Intravenous BID  . guaiFENesin  1,200 mg Oral BID  . insulin aspart  0-5 Units Subcutaneous QHS  . insulin aspart  0-9 Units Subcutaneous TID WC  . insulin glargine  10 Units Subcutaneous Daily  . loratadine  10 mg Oral Daily  . mouth rinse  15 mL Mouth Rinse BID  . methylPREDNISolone (SOLU-MEDROL) injection  60 mg Intravenous Q8H  . pantoprazole  40 mg Oral QAC breakfast  . potassium chloride SA  20 mEq Oral Daily  . sodium chloride flush  3 mL Intravenous Q12H  . terazosin  2 mg Oral QHS   Continuous Infusions: . sodium chloride    . sodium chloride Stopped (03/01/17 0252)  . cefTRIAXone (ROCEPHIN)  IV Stopped (02/28/17 1430)  . dextrose 5 % and 0.45% NaCl  25 mL/hr at 03/01/17 0252     LOS: 5 days  Aline August, MD [facility] Pager [phone]  If 7PM-7AM, please contact night-coverage [email or web address] Password [facility] 03/01/2017, 11:49 AM

## 2017-03-01 NOTE — Progress Notes (Signed)
Patient placed on BiPAP for the night while he sleeps, per his request. RT will continue to monitor as needed.

## 2017-03-01 NOTE — Progress Notes (Signed)
Report received in patient's room via Rey RN using [employer], reviewed VS, PMH and patient's general condition, assumed care of patient.

## 2017-03-01 NOTE — Progress Notes (Signed)
Physical Therapy Treatment Patient Details Name: Gilbert Dominguez MRN: [id number] DOB: 09-27-1946 Today's Date: 03/01/2017    History of Present Illness Gilbert Dominguez is a 71 y.o. male with medical history significant of pulmonary fibrosis, chronic respiratory failure oxygen dependent of 2 L, HTN,  HLD, RA; who presents with complaints of shortness of breath progressively worsening over the last 3 days.  Working dx in diastolic HF.  6/29 pt's breathin worsened in hospital and pt placed on Bipap.    PT Comments    Pt able to increase ambulation distance with o2 dropping to mid 80's 2x with o2 at 8 L/min.  He was able to stop and do pursed lip breathing and o2 quickly returned to 90s.   Follow Up Recommendations  Home health PT;Other (comment) (until he can get back to pulmonary rehab)     Equipment Recommendations  None recommended by PT    Recommendations for Other Services       Precautions / Restrictions Precautions Precautions: Fall Precaution Comments: watch O2 stats Restrictions Weight Bearing Restrictions: No    Mobility  Bed Mobility Overal bed mobility: Modified Independent                Transfers Overall transfer level: Needs assistance Equipment used: None Transfers: Sit to/from Stand Sit to Stand: Supervision         General transfer comment: for safety, no unsteadiness  Ambulation/Gait Ambulation/Gait assistance: Min guard Ambulation Distance (Feet): 350 Feet Assistive device:  (pushing IV pole) Gait Pattern/deviations: Step-through pattern Gait velocity: slower   General Gait Details: Ambulated on 8 L/min with o2 sat dropping to mid 80's twice during session.  Both times, had pt stop and do pursed lip breathing and o2 would return to the 90's  O2 averaged about 93%.   Stairs            Wheelchair Mobility    Modified Rankin (Stroke Patients Only)       Balance                                             Cognition Arousal/Alertness: Awake/alert Behavior During Therapy: WFL for tasks assessed/performed Overall Cognitive Status: Within Functional Limits for tasks assessed                                        Exercises      General Comments        Pertinent Vitals/Pain Pain Assessment: No/denies pain    Home Living                      Prior Function            PT Goals (current goals can now be found in the care plan section) Acute Rehab PT Goals Patient Stated Goal: Get back to mobility while keeping up my oxygen PT Goal Formulation: With patient Time For Goal Achievement: 03/13/17 Potential to Achieve Goals: Good Progress towards PT goals: Progressing toward goals    Frequency    Min 3X/week      PT Plan Current plan remains appropriate    Co-evaluation              AM-PAC PT "6 Clicks" Daily Activity  Outcome Measure  Difficulty turning  over in bed (including adjusting bedclothes, sheets and blankets)?: None Difficulty moving from lying on back to sitting on the side of the bed? : None Difficulty sitting down on and standing up from a chair with arms (e.g., wheelchair, bedside commode, etc,.)?: None Help needed moving to and from a bed to chair (including a wheelchair)?: A Little Help needed walking in hospital room?: A Little Help needed climbing 3-5 steps with a railing? : A Little 6 Click Score: 21    End of Session Equipment Utilized During Treatment: Oxygen Activity Tolerance: Patient tolerated treatment well Patient left: in chair;with call bell/phone within reach Nurse Communication: Mobility status PT Visit Diagnosis: Difficulty in walking, not elsewhere classified (R26.2);Unsteadiness on feet (R26.81)     Time: 3016-0109 PT Time Calculation (min) (ACUTE ONLY): 20 min  Charges:  $Gait Training: 8-22 mins                    G Codes:       Gilbert Dominguez L. Gilbert Dominguez, [place] Pager [phone] 03/01/2017    Galen Manila 03/01/2017, 11:27 AM

## 2017-03-01 NOTE — Progress Notes (Addendum)
Briefly spoke with patient this AM about the course of events in regards to his high blood glucose levels.  Discussed with patient that he is getting IV steroids that likely may be causing his high glucose levels.  Explained why we started an IV Insulin drip and how we will transition patient off the IV insulin drip to SQ insulin today.  Explained to pt that we have drawn a Hemoglobin A1c level and we will use this lab to help determine if pt has a diagnosis of DM.    Patient stated to me that he was glad we are checking for DM and that both his wife and daughter have DM so he has a little bit of experience in dealing with diabetes.  Will follow patient for A1c results.    --Will follow patient during hospitalization--  Wyn Quaker RN, MSN, CDE Diabetes Coordinator Inpatient Glycemic Control Team Team Pager: [phone] (8a-5p)

## 2017-03-01 NOTE — Progress Notes (Signed)
Name: Gilbert Dominguez MRN: [id number] DOB: 02/28/1946    ADMISSION DATE:  02/24/2017 CONSULTATION DATE: 02/26/2017  REFERRING MD :  Starla Link  CHIEF COMPLAINT:  Worsening dyspnea with hypoxia  BRIEF PATIENT DESCRIPTION: 71 y/o M with known pulmonary fibrosis on 2L at rest, 8L with exertion / prednisone 20 mg QD (folowed by Wert/McQuaid), pulmonary hypertension, former smoker (quit 1996), suspected Agent Orange exposure, CKD III RA (previously on MTX, followed by Dr. Charlestine Night) admitted 5/9 with a 3 day history of worsening shortness of breath, cough and increased LE edema. BNP was found to be > 2000. Pt. He wasadmitted for acute on chronic hypoxic respiratory failure in setting of acute dCHF.  IV Lasix was started 40 mg BID. Net diuresis of 3.1 L with 7 lb weight reduction. Scr is stable. Dyspnea worsened 5/10, CT of the chest showed chronic interstitial disease and PHTN, stable mediastinal lymphadenopathy ( ? Reactive), Emphysema with honeycombing at the bases and underlying chronic UIP.  There was additional notation of cardiomegaly and PHTN.Troponin trend 0.1-->0.15-->0.13. RVP was + Metapneumonic virus.  Pt required increased oxygen demands 5/10/pm. He was maintained on Venti Mask through the night and was transitioned to HHF nasal oxygen.    SUBJECTIVE:  Pt reports feeling much better.  O2 to 8L.    VITAL SIGNS: Temp:  [98.3 F (36.8 C)-98.6 F (37 C)] 98.6 F (37 C) (05/14 0329) Pulse Rate:  [48-97] 97 (05/14 0802) Resp:  [14-24] 20 (05/14 0802) BP: (100-134)/(71-96) 118/96 (05/14 0329) SpO2:  [83 %-100 %] 95 % (05/14 0802) Weight:  [123 lb (55.8 kg)] 123 lb (55.8 kg) (05/14 0329)  PHYSICAL EXAMINATION: General: well developed adult male in NAD, sitting in chair  HEENT: MM pink/moist, fair dentition  PSY: calm/appropriate Neuro: AAOx4, speech clear, MAE CV: s1s2 rrr, no m/r/g PULM: even/non-labored, lungs bilaterally with basilar crackles  WN:IOEV, non-tender, bsx4 active    Extremities: warm/dry, 1+ pitting edema  Skin: no rashes or lesions    Recent Labs Lab 02/28/17 2225 03/01/17 0207 03/01/17 0548  NA 131* 137 142  K 4.7 3.7 3.7  CL 87* 96* 98*  CO2 34* 31 32  BUN 55* 52* 48*  CREATININE 1.79* 1.25* 1.05  GLUCOSE 693* 307* 95    Recent Labs Lab 02/27/17 0455 02/28/17 0700 03/01/17 0207  HGB 13.2 13.8 12.9*  HCT 41.9 41.4 40.0  WBC 6.4 8.2 8.1  PLT 135* 140* 138*   No results found.  SIGNIFICANT EVENTS  5/06  Worsening shortness of breath at home 5/09  Admit to [facility] with SOB, LE edema and cough  5/13  O2 weaned to 8L  STUDIES:  PFTs 02/02/2017>>VC 2.12 (64%) and dlco 35/34 and dlco 46%  Echo 12/08/16 >> EF 55-60%. D-shaped interventricular septum suggestive of RV pressure/volume overload. Grade 1 diastolic dysfunction,Severely dilated RV with moderate systolic dysfunction. Severe pulmonary hypertension ( 61 mm Hg). Mild aortic stenosis. CTA 12/08/16 >>  neg PE   CULTURES:  RVP 5/9 >> metapneumovirus positive  BCx2 5/11 >>   ANTIBIOTICS:  Azithromycin 5/11 >>  Ceftriaxone 5/11 >>   ASSESSMENT / PLAN:  Acute on Chronic Hypoxic Respiratory Failure in setting of Pulmonary Fibrosis, acute dCHF exacerbation and + Metapneumonic virus.   Plan: Continue HFNC, wean O2 back to baseline of 2L at rest, 8L with activity  Pulmonary hygiene - IS, flutter, mobilize Mucinex 1200 mg BID  Follow cultures as above  ABX as above, D5/x  Trend PCT  Intermittent CXR Lasix 40 mg IV  BID  Reduce solu-medrol to 40 mg QD (from 67m IV Q8)   Acute on Chronic dCHF Plan: Diuresis per Cardiology  Telemetry monitoring   Hyperglycemia - in setting of steroids  Plan: Insulin gtt per primary MD.   Gilbert Gens NP-C [facility] Pgr: [phone] or if no answer [phone]/14/2018, 12:48 PM  Attending Note:  71year old male with extensive pulmonary disease to include IPF and pulmonary HTN who suffered an acute  metapneumovirus infection exacerbating his chronic hypoxemic respiratory failure.  PCCM consulted for increased O2 demand.  On exam, end expiratory wheezes and crackles noted.  I reviewed CXR myself, infiltrate and fibrosis noted.  Discussed with PCCM-NP and TRH-MD.  Metapneumovirus:  - Supportive care.  Hypoxemic respiratory failure:  - HFNC between 6 and 8 liters  - Hope to titrate down to home dose (2 at rest and 6 with activity).  IPF:  - Solumedrol change to 40 mg IV q8 and if continues to do well then will re-evaluate on Wednesday may get down to 20 and if that is tolerated then change to prednisone home dose at 20 mg PO daily.  - Mucinex  Pulmonary infiltrate:  - F/U on cultures  - Continue rocephin (total of 8 days) and zithromax (total of 5 days).  Pulmonary HTN:  - F/U outpatient with Dr. MLake Bells - Keep fluid negative to even.  PCCM will follow  Patient seen and examined, agree with above note.  I dictated the care and orders written for this patient under my direction.  Gilbert Dominguez [place]

## 2017-03-02 ENCOUNTER — Inpatient Hospital Stay (HOSPITAL_COMMUNITY): Admission: RE | Admit: 2017-03-02 | Payer: Medicare Other | Source: Ambulatory Visit

## 2017-03-02 ENCOUNTER — Inpatient Hospital Stay (HOSPITAL_COMMUNITY): Payer: Medicare Other

## 2017-03-02 ENCOUNTER — Telehealth: Payer: Self-pay

## 2017-03-02 DIAGNOSIS — I50810 Right heart failure, unspecified: Secondary | ICD-10-CM

## 2017-03-02 DIAGNOSIS — I5043 Acute on chronic combined systolic (congestive) and diastolic (congestive) heart failure: Secondary | ICD-10-CM

## 2017-03-02 DIAGNOSIS — I272 Pulmonary hypertension, unspecified: Secondary | ICD-10-CM

## 2017-03-02 DIAGNOSIS — I2729 Other secondary pulmonary hypertension: Secondary | ICD-10-CM

## 2017-03-02 LAB — CBC WITH DIFFERENTIAL/PLATELET
BASOS PCT: 0 %
Basophils Absolute: 0 10*3/uL (ref 0.0–0.1)
EOS ABS: 0 10*3/uL (ref 0.0–0.7)
Eosinophils Relative: 0 %
HEMATOCRIT: 44.7 % (ref 39.0–52.0)
Hemoglobin: 14.4 g/dL (ref 13.0–17.0)
LYMPHS PCT: 7 %
Lymphs Abs: 0.7 10*3/uL (ref 0.7–4.0)
MCH: 29 pg (ref 26.0–34.0)
MCHC: 32.2 g/dL (ref 30.0–36.0)
MCV: 89.9 fL (ref 78.0–100.0)
MONOS PCT: 8 %
Monocytes Absolute: 0.8 10*3/uL (ref 0.1–1.0)
NEUTROS ABS: 8.6 10*3/uL — AB (ref 1.7–7.7)
Neutrophils Relative %: 85 %
Platelets: 159 10*3/uL (ref 150–400)
RBC: 4.97 MIL/uL (ref 4.22–5.81)
RDW: 16.6 % — ABNORMAL HIGH (ref 11.5–15.5)
WBC: 10.1 10*3/uL (ref 4.0–10.5)

## 2017-03-02 LAB — COMPREHENSIVE METABOLIC PANEL
ALK PHOS: 123 U/L (ref 38–126)
ALT: 202 U/L — AB (ref 17–63)
AST: 195 U/L — ABNORMAL HIGH (ref 15–41)
Albumin: 2.8 g/dL — ABNORMAL LOW (ref 3.5–5.0)
Anion gap: 10 (ref 5–15)
BILIRUBIN TOTAL: 0.7 mg/dL (ref 0.3–1.2)
BUN: 57 mg/dL — ABNORMAL HIGH (ref 6–20)
CO2: 33 mmol/L — ABNORMAL HIGH (ref 22–32)
CREATININE: 1.24 mg/dL (ref 0.61–1.24)
Calcium: 9.1 mg/dL (ref 8.9–10.3)
Chloride: 97 mmol/L — ABNORMAL LOW (ref 101–111)
GFR, EST NON AFRICAN AMERICAN: 56 mL/min — AB (ref >60)
Glucose, Bld: 116 mg/dL — ABNORMAL HIGH (ref 65–99)
Potassium: 4.1 mmol/L (ref 3.5–5.1)
Sodium: 140 mmol/L (ref 135–145)
Total Protein: 5.8 g/dL — ABNORMAL LOW (ref 6.5–8.1)

## 2017-03-02 LAB — GLUCOSE, CAPILLARY
GLUCOSE-CAPILLARY: 100 mg/dL — AB (ref 65–99)
GLUCOSE-CAPILLARY: 314 mg/dL — AB (ref 65–99)
Glucose-Capillary: 339 mg/dL — ABNORMAL HIGH (ref 65–99)
Glucose-Capillary: 188 mg/dL — ABNORMAL HIGH (ref 65–99)

## 2017-03-02 LAB — MAGNESIUM: Magnesium: 2.6 mg/dL — ABNORMAL HIGH (ref 1.7–2.4)

## 2017-03-02 LAB — HEMOGLOBIN A1C
Hgb A1c MFr Bld: 8.8 % — ABNORMAL HIGH (ref 4.8–5.6)
Mean Plasma Glucose: 206 mg/dL

## 2017-03-02 MED ORDER — FUROSEMIDE 40 MG PO TABS
40.0000 mg | ORAL_TABLET | Freq: Two times a day (BID) | ORAL | Status: DC
  Administered 2017-03-02 – 2017-03-04 (×4): 40 mg via ORAL
  Filled 2017-03-02 (×4): qty 1

## 2017-03-02 MED ORDER — METHYLPREDNISOLONE SODIUM SUCC 40 MG IJ SOLR
20.0000 mg | Freq: Every day | INTRAMUSCULAR | Status: DC
  Administered 2017-03-03: 20 mg via INTRAVENOUS
  Filled 2017-03-02: qty 1

## 2017-03-02 MED ORDER — LIVING WELL WITH DIABETES BOOK
Freq: Once | Status: AC
Start: 2017-03-02 — End: 2017-03-02
  Administered 2017-03-02: 14:00:00
  Filled 2017-03-02: qty 1

## 2017-03-02 NOTE — Telephone Encounter (Signed)
Sleep study ordered for scheduling under Dr. Debara Pickett, patient's primary cardiologist.

## 2017-03-02 NOTE — Telephone Encounter (Signed)
-----  Message from Ledora Bottcher, [place] sent at 03/02/2017 11:03 AM EDT ----- Regarding: schedule for sleep study Hi! Dr. Radford Pax gave me your info to get this patient scheduled for a sleep study. He already has home oxygen and is using a CPAP while here in the hospital. He is not discharging today, but can you please schedule him?  Many thanks Angie

## 2017-03-02 NOTE — Plan of Care (Signed)
Problem: Activity: Goal: Risk for activity intolerance will decrease Outcome: Progressing Pt ambulating in room with minimal assist

## 2017-03-02 NOTE — Progress Notes (Signed)
Progress Note  Patient Name: Gilbert Dominguez Date of Encounter: 03/02/2017  Primary Cardiologist: Dr. Debara Pickett  Subjective   Patient is feeling well; denies chest pain, SOB, and palpitations. Pt requests CPAP for home. Staff message sent.  Inpatient Medications    Scheduled Meds: . aspirin EC  81 mg Oral Daily  . atorvastatin  20 mg Oral Daily  . azithromycin  500 mg Oral Q24H  . enoxaparin (LOVENOX) injection  40 mg Subcutaneous Q24H  . famotidine  20 mg Oral QHS  . feeding supplement (GLUCERNA SHAKE)  237 mL Oral TID BM  . furosemide  40 mg Intravenous BID  . guaiFENesin  1,200 mg Oral BID  . insulin aspart  0-5 Units Subcutaneous QHS  . insulin aspart  0-9 Units Subcutaneous TID WC  . insulin glargine  10 Units Subcutaneous Daily  . loratadine  10 mg Oral Daily  . mouth rinse  15 mL Mouth Rinse BID  . methylPREDNISolone (SOLU-MEDROL) injection  40 mg Intravenous Daily  . pantoprazole  40 mg Oral QAC breakfast  . potassium chloride SA  20 mEq Oral Daily  . sodium chloride flush  3 mL Intravenous Q12H  . terazosin  2 mg Oral QHS   Continuous Infusions: . sodium chloride    . cefTRIAXone (ROCEPHIN)  IV Stopped (03/01/17 1430)  . dextrose 5 % and 0.45% NaCl 25 mL/hr at 03/01/17 0252   PRN Meds: sodium chloride, acetaminophen, ipratropium-albuterol, ondansetron (ZOFRAN) IV, sodium chloride flush   Vital Signs    Vitals:   03/02/17 0345 03/02/17 0626 03/02/17 0849 03/02/17 0854  BP: 117/87  (!) 117/8 104/80  Pulse: (!) 53  (!) 107   Resp: 14  15   Temp:      TempSrc:      SpO2: (!) 89%  92%   Weight:  119 lb 14.4 oz (54.4 kg)    Height:        Intake/Output Summary (Last 24 hours) at 03/02/17 0930 Last data filed at 03/02/17 0855  Gross per 24 hour  Intake             1080 ml  Output             2300 ml  Net            -1220 ml   Filed Weights   02/28/17 0419 03/01/17 0329 03/02/17 0626  Weight: 121 lb 12.8 oz (55.2 kg) 123 lb (55.8 kg) 119 lb 14.4 oz  (54.4 kg)     Physical Exam   General: Well developed, well nourished, male appearing in no acute distress. Head: Normocephalic, atraumatic.  Neck: Supple without bruits, JVD. Lungs:  Resp regular and unlabored, CTA in upper lobes, scattered crackles in bases Heart: RRR, S1, S2, no murmur; no rub. Abdomen: Soft, non-tender, non-distended with normoactive bowel sounds. No hepatomegaly. No rebound/guarding. No obvious abdominal masses. Extremities: No clubbing, cyanosis, Trace to 1+ L > R LE edema. Distal pedal pulses are 2+ bilaterally. Neuro: Alert and oriented X 3. Moves all extremities spontaneously. Psych: Normal affect.  Labs    Chemistry Recent Labs Lab 02/27/17 0455  03/01/17 0207 03/01/17 0548 03/02/17 0557  NA 135  < > 137 142 140  K 4.7  < > 3.7 3.7 4.1  CL 93*  < > 96* 98* 97*  CO2 33*  < > 31 32 33*  GLUCOSE 198*  < > 307* 95 116*  BUN 40*  < > 52* 48* 57*  CREATININE 1.38*  < > 1.25* 1.05 1.24  CALCIUM 9.0  < > 8.5* 8.6* 9.1  PROT 5.3*  --   --   --  5.8*  ALBUMIN 2.5*  --   --   --  2.8*  AST 537*  --   --   --  195*  ALT 286*  --   --   --  202*  ALKPHOS 129*  --   --   --  123  BILITOT 0.9  --   --   --  0.7  GFRNONAA 50*  < > 56* >60 56*  GFRAA 57*  < > >60 >60 >60  ANIONGAP 9  < > _0 < > = values in this interval not displayed.   Hematology Recent Labs Lab 02/28/17 0700 03/01/17 0207 03/02/17 0557  WBC 8.2 8.1 10.1  RBC 4.69 4.50 4.97  HGB 13.8 12.9* 14.4  HCT 41.4 40.0 44.7  MCV 88.3 88.9 89.9  MCH 29.4 28.7 29.0  MCHC 33.3 32.3 32.2  RDW 16.8* 16.6* 16.6*  PLT 140* 138* 159    Cardiac Enzymes Recent Labs Lab 02/25/17 0438 02/25/17 1045  TROPONINI 0.15* 0.13*    Recent Labs Lab 02/24/17 1947  TROPIPOC 0.10*     BNP Recent Labs Lab 02/24/17 1939  BNP 2,452.3*     DDimer No results for input(s): DDIMER in the last 168 hours.   Radiology    Dg Chest 2 View  Result Date: 03/02/2017 CLINICAL DATA:  Headache,  weakness, history of pulmonary fibrosis, emphysema, CHF, former smoker. EXAM: CHEST  2 VIEW COMPARISON:  Chest x-ray of Feb 24, 2017 and chest CT scan of Feb 25, 2017. FINDINGS: The lungs are reasonably well inflated. The interstitial markings remain increased with areas of stable patchy confluence bilaterally. The cardiac silhouette is mildly enlarged. The pulmonary vascularity is not engorged. There is calcification in the wall of the aortic arch. There is no pleural effusion. IMPRESSION: Patchy confluent densities bilaterally not greatly changed from the previous study which may reflect acute pneumonia superimposed upon chronic fibrotic and emphysematous changes. No pulmonary edema. Thoracic aortic atherosclerosis. Electronically Signed   By: [name] M.D.   On: 03/02/2017 07:45     Telemetry    NSR with PACs in the 80s - Personally Reviewed  ECG    No new tracings - Personally Reviewed   Cardiac Studies   Echo 12/08/16 Study Conclusions - Left ventricle: The cavity size was normal. Systolic function was normal. The estimated ejection fraction was in the range of 55% to 60%. Wall motion was normal; there were no regional wall motion abnormalities. Doppler parameters are consistent with abnormal left ventricular relaxation (+). - Ventricular septum: D-shaped interventricular septum suggestive of RV pressure/volume overload. - Aortic valve: Trileaflet; moderately calcified leaflets. There was mild stenosis. Mean gradient (S): 10 mm Hg. Valve area (VTI): 1.52 cm^2. - Mitral valve: There was no significant regurgitation. - Right ventricle: The cavity size was severely dilated. Systolic function was moderately reduced. - Right atrium: The atrium was moderately dilated. - Tricuspid valve: Peak RV-RA gradient (S): 61 mm Hg. - Systemic veins: IVC not visualized. - Pericardium, extracardiac: A trivial pericardial effusion was identified.  Impressions: - Normal LV  size with EF 55-60%. D-shaped interventricular septum suggestive of RV pressure/volume overload. Severely dilated RV with moderate systolic dysfunction. Severe pulmonary hypertension. Mild aortic stenosis.  Patient Profile     71 y.o. male  [name] ( ?  Exposure to agent orange) with hx of chronic respiratory failure with hypoxia (on oxygen 24/7 - followed by Dr, Melvyn Novas), RA (previously treated with MTX - followed by Dr. Charlestine Night), prior 37 pack year hx of tobacco smoking (quit 1996), pHTN, pulmonary fibrosis and CKD stage III who is being seen today for the evaluation of acute CHFat the request of Dr. Starla Link.   Echo 11/2016 showed normal LV function to 55-60%, grade 1 DD, mild AS, D-shaped interventricular septum suggestive of RV pressure/volume overload. Severely dilated RV with moderate systolic dysfunction. Severe pulmonary hypertension (2m Hg)  Assessment & Plan    1. Acute on chronic diastolic heart failure, echo with right heart failure - He continues to diurese on 40 mg IV twice a day Lasix - His weight today is 119 pounds (133 lbs on admission), and he is overall neck negative 6.8 L with 2.1 L urine output yesterday -CO2 is trending up and may indicate he is near his dry weight; dry weight currently unknown per EPIC; per the patient he weighs near 133 lbs at home - transition IV lasix to PO lasix today - K stable at 4.1 (3.7)  2. Acute on chronic respiratory failure with hypoxia, pulmonary fibrosis, pulmonary hypertension, human metapneumovirus - Antibiotic treatment per primary team, he remains on Zithromax and Rocephin  - Pt feels well and is breathing better  3. Elevated troponin - Troponin trend has been flat with no active chest pain and no signs of ischemia - Will continue to monitor however this is likely demand ischemia in the setting of hypoxia and right heart failure  4. CKD stage III - sCr today 1.24 (1.05) -Continues to make good urine   5. HTN -  pressure remains controlled without medications; sBP  100-110s  6.  HLD - continue lipitor   Signed, ALedora Bottcher, PA-C 9:30 AM 03/02/2017 Pager: [phone] Attending Note:   The patient was seen and examined.  Agree with assessment and plan as noted above.  Changes made to the above note as needed.  Patient seen and independently examined with ADoreene Adas PA .   We discussed all aspects of the encounter. I agree with the assessment and plan as stated above.  1.    CHF:   Improving. Will change to PO lasix today   2. Elevated Troponin:   Flat trend.   Not suggestive of  ACS.  3. Hyperlipidemia:   Continue atorvastatin     I have spent a total of 30 minutes with patient reviewing hospital  notes , telemetry, EKGs, labs and examining patient as well as establishing an assessment and plan that was discussed with the patient. > 50% of time was spent in direct patient care.  He should be able to be discharged soon .  PThayer Headings JBrooke Bonito, MD, FSelect Specialty Hospital Warren Campus5/15/2018, 10:38 AM [address]  SNorth MerrickPager [phone]

## 2017-03-02 NOTE — Care Management Important Message (Signed)
Important Message  Patient Details  Name: Gilbert Dominguez MRN: [id number] Date of Birth: Jun 16, 1946   Medicare Important Message Given:  Yes    [name] May 03/02/2017, 11:18 AM

## 2017-03-02 NOTE — Progress Notes (Signed)
Name: Gilbert Dominguez MRN: [id number] DOB: 13-Jul-1946    ADMISSION DATE:  02/24/2017 CONSULTATION DATE: 02/26/2017  REFERRING MD :  Gilbert Dominguez  CHIEF COMPLAINT:  Worsening dyspnea with hypoxia  BRIEF PATIENT DESCRIPTION: 71 y/o Gilbert with known pulmonary fibrosis on 2L at rest, 8L with exertion / prednisone 20 mg QD (folowed by Gilbert Dominguez/Gilbert Dominguez), pulmonary hypertension, former smoker (quit 1996), suspected Agent Orange exposure, CKD III RA (previously on MTX, followed by Dr. Charlestine Dominguez) admitted 5/9 with a 3 day history of worsening shortness of breath, cough and increased LE edema. BNP was found to be > 2000. Pt. He wasadmitted for acute on chronic hypoxic respiratory failure in setting of acute dCHF.  IV Lasix was started 40 mg BID. Net diuresis of 3.1 L with 7 lb weight reduction. Scr is stable. Dyspnea worsened 5/10, CT of the chest showed chronic interstitial disease and PHTN, stable mediastinal lymphadenopathy ( ? Reactive), Emphysema with honeycombing at the bases and underlying chronic UIP.  There was additional notation of cardiomegaly and PHTN.Troponin trend 0.1-->0.15-->0.13. RVP was + Metapneumonic virus.  Pt required increased oxygen demands 5/10/pm. He was maintained on Venti Mask through the Dominguez and was transitioned to HHF nasal oxygen.    SUBJECTIVE: Doing well, O2 sat monitor giving false readings   VITAL SIGNS: Temp:  [98.2 F (36.8 C)] 98.2 F (36.8 C) (05/14 1456) Pulse Rate:  [53-107] 107 (05/15 0849) Resp:  [12-26] 15 (05/15 0849) BP: (104-117)/(8-87) 104/80 (05/15 0854) SpO2:  [89 %-100 %] 92 % (05/15 0849) Weight:  [119 lb 14.4 oz (54.4 kg)] 119 lb 14.4 oz (54.4 kg) (05/15 0626)  PHYSICAL EXAMINATION: General:  Frail WM ambulating on O2 HEENT: MM pink/moist VEH:MCNOB affect Neuro: Intact CV: s1s2 rrr, no Gilbert/r/g PULM:Mild increased WOB(just walked) SJ:GGEZ, non-tender, bsx4 active  Extremities: warm/dry+ edema  Skin: no rashes or lesions     Recent Labs Lab  03/01/17 0207 03/01/17 0548 03/02/17 0557  NA 137 142 140  K 3.7 3.7 4.1  CL 96* 98* 97*  CO2 31 32 33*  BUN 52* 48* 57*  CREATININE 1.25* 1.05 1.24  GLUCOSE 307* 95 116*    Recent Labs Lab 02/28/17 0700 03/01/17 0207 03/02/17 0557  HGB 13.8 12.9* 14.4  HCT 41.4 40.0 44.7  WBC 8.2 8.1 10.1  PLT 140* 138* 159   Dg Chest 2 View  Result Date: 03/02/2017 CLINICAL DATA:  Headache, weakness, history of pulmonary fibrosis, emphysema, CHF, former smoker. EXAM: CHEST  2 VIEW COMPARISON:  Chest x-ray of Feb 24, 2017 and chest CT scan of Feb 25, 2017. FINDINGS: The lungs are reasonably well inflated. The interstitial markings remain increased with areas of stable patchy confluence bilaterally. The cardiac silhouette is mildly enlarged. The pulmonary vascularity is not engorged. There is calcification in the wall of the aortic arch. There is no pleural effusion. IMPRESSION: Patchy confluent densities bilaterally not greatly changed from the previous study which may reflect acute pneumonia superimposed upon chronic fibrotic and emphysematous changes. No pulmonary edema. Thoracic aortic atherosclerosis. Electronically Signed   By: Gilbert  Dominguez Gilbert.D.   On: 03/02/2017 07:45    SIGNIFICANT EVENTS  5/06  Worsening shortness of breath at home 5/09  Admit to [facility] with SOB, LE edema and cough  5/13  O2 weaned to 8L  STUDIES:  PFTs 02/02/2017>>VC 2.12 (64%) and dlco 35/34 and dlco 46%  Echo 12/08/16 >> EF 55-60%. D-shaped interventricular septum suggestive of RV pressure/volume overload. Grade 1 diastolic dysfunction,Severely dilated RV with moderate systolic  dysfunction. Severe pulmonary hypertension ( 61 mm Hg). Mild aortic stenosis. CTA 12/08/16 >>  neg PE   CULTURES:  RVP 5/9 >> metapneumovirus positive  BCx2 5/11 >>   ANTIBIOTICS:  Azithromycin 5/11 >>  Ceftriaxone 5/11 >>   ASSESSMENT / PLAN:  Acute on Chronic Hypoxic Respiratory Failure in setting of Pulmonary Fibrosis, acute dCHF  exacerbation and + Metapneumonic virus.   Plan: Wean HFNC, wean O2 back to baseline of 2L at rest, 8L with activity. Note faulty readings on O2 sat monitor. Pulmonary hygiene - IS, flutter, mobilize Mucinex 1200 mg BID  Follow cultures as above  ABX as above, D6/x   PCT 1.21 5/13 Intermittent CXR Lasix 40 iv bid on 5/14  Reduce solu-medrol to 40 mg QD (from 59m IV Q8)5/15   Acute on Chronic dCHF  Intake/Output Summary (Last 24 hours) at 03/02/17 0937 Last data filed at 03/02/17 0855  Gross per 24 hour  Intake             1080 ml  Output             2300 ml  Net            -1220 ml   Filed Weights   02/28/17 0419 03/01/17 0329 03/02/17 0626  Weight: 121 lb 12.8 oz (55.2 kg) 123 lb (55.8 kg) 119 lb 14.4 oz (54.4 kg)   Plan: Diuresis per Cardiology  Telemetry monitoring   Hyperglycemia - in setting of steroids  CBG (last 3)   Recent Labs  03/01/17 1638 03/01/17 2004 03/02/17 0747  GLUCAP 170* 285* 100*     Plan: SSI per IM   SRichardson LandryMinor ACNP LMaryanna ShapePCCM Pager [phone] 3 pm If no answer page [phone]/15/2018, 9:38 AM  Attending Note:  71year old male with extensive pulmonary disease to include IPF and pulmonary HTN who suffered acute metapneumovirus infection exacerbating his chronic hypoxemic respiratory failure.  PCCM was consulted for increased O2 demand.  On exam, wheezes are improved but crackles remain.  I reviewed CXR myself, infiltrate and fiborsis noted.  Discussed with PCCM-NP.  Metapneumovirus:  - Continue supportive care.  Hypoxemic respiratory failure:  - HFNC, titrate down as able, down to 6  - Will need O2 level determined for activity prior to discharge  IPF:  - Solumedrol at 40 q8, decreased to 20 mg IV q8 and titrate towards his chronic dose of prednisone 20 mg PO daily.  - Mucinex.  Pulmonary infiltrate:  - F/U on cultures  - Rocephin (total 8 days) and zithromax (total 5 days)  Pulmonary HTN  - F/U outpatient with  Dr. MLake Dominguez(apt made, see epic)  - Keep fluid even to negative.  PCCM will sign off, please call back if needed.  Patient seen and examined, agree with above note.  I dictated the care and orders written for this patient under my direction.  YRush Farmer MMontura

## 2017-03-02 NOTE — Progress Notes (Signed)
Inpatient Diabetes Program Recommendations  AACE/ADA: New Consensus Statement on Inpatient Glycemic Control (2015)  Target Ranges:  Prepandial:   less than 140 mg/dL      Peak postprandial:   less than 180 mg/dL (1-2 hours)      Critically ill patients:  140 - 180 mg/dL   Lab Results  Component Value Date   GLUCAP 100 (H) 03/02/2017   HGBA1C 8.8 (H) 03/01/2017    Review of Glycemic Control  Inpatient Diabetes Program Recommendations:   Spoke with pt @ bedside about new diagnosis. Discussed A1C results and explained what an A1C is, basic pathophysiology of DM Type 2, basic home care, basic diabetes diet nutrition principles, importance of checking CBGs and maintaining good CBG control to prevent long-term and short-term complications. Reviewed signs and symptoms of hyperglycemia and hypoglycemia and how to treat hypoglycemia at home. Also reviewed blood sugar goals at home.  RNs to provide ongoing basic DM education at bedside with this patient. Have ordered educational booklet and DM videos. Have also placed RD consult for DM diet education for this patient.  On discharge Please order: -Blood glucose meter kit (includes lancets & strips) [id number] Patient states understanding of materials reviewed.  Thank you, Nani Gasser. Daylen Lipsky, RN, MSN, CDE  Diabetes Coordinator Inpatient Glycemic Control Team Team Pager [phone] (8am-5pm) 03/02/2017 2:51 PM

## 2017-03-02 NOTE — Progress Notes (Signed)
Patient ID: Gilbert Dominguez, male   DOB: 04-18-1946, 71 y.o.   MRN: [id number]  PROGRESS NOTE    Gilbert Dominguez  EPP:[id number] DOB: June 15, 1946 DOA: 02/24/2017 PCP: Leonard Downing, MD   Brief Narrative:  71 year old male with medical history of pulmonary fibrosis, chronic hypoxic respiratory failure on home oxygen 2 L at rest and up to 8 L on exertion, hypertension, dyslipidemia, RA presented with shortness of breath progressively worsening and significant weight gain. He was admitted with acute and chronic hypoxic respiratory failure and acute diastolic CHF exacerbation and positive troponins, started on Lasix intravenously. His oxygen requirement worsened so he was transferred to stepdown unit. He was evaluated by pulmonary and started on intravenous steroids and antibiotics. Cardiology has also evaluated the patient and patient is continued on IV Lasix. 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 Acute Diastolic CHF exacerbation:. - Continue with Lasix 40 mg IV twice a day. Strict input and output, daily weight. Monitor creatinine. - Follow further recommendations from cardiology.   Gross per 24 hour  Intake             1080 ml  Output             1950 ml  Net             -870 ml   Negative balance of 6885 mL since admission  2. Acute on chronic respiratory failure with hypoxia withpulmonary fibrosisand pulmonary artery hypertension:  - Patient's respiratory status is improved. Continue with oxygen supplementation.  - Follow  up from pulmonary has been appreciated. Continue with tapering Solu-Medrol and switch to home dose of prednisone 20 mg daily in 1-2 days. Complete course of Rocephin and Zithromax. Cultures have been negative so far.  - Respiratory panel waspositive for metapneumovirus. -Continue Duonebs   3. Elevated troponin:probably secondary to above  4. Chronic kidney disease stage III: Creatinine stable, repeat BMP in a.m.  5. Essential hypertension: Monitor blood pressure; continue Lasix and Terazosin  6. Hyperlipidemia - Continue Lipitor   7. GERD - Continue Pepcid and Protonix   8. Hypokalemia: Improved  9. Acute transaminitis questionable cause: Improving. Repeat LFTs for tomorrow  10. New diagnosis of diabetes mellitus type 2 with hyperglycemia:  Hemoglobin A1c is 8.8. Continue Accu-Cheks with coverage and long-acting insulin. Decrease the dose of Solu Medrol   DVT prophylaxis:Lovenox Code Status:Full Family Communication:None present at bedside Disposition Plan:depends on condition improvement. Probable home in 2-4 days.  Consultants:Cardiology Pulmonary  Procedures:None Antimicrobials:Rocephin and Zithromax from 02/26/2017   Subjective: Patient seen and examined at bedside. He denies any overnight fever, nausea, vomiting. He still is short of breath with minimal exertion but feels a little better.  Objective: Vitals:   03/02/17 0626 03/02/17 0849 03/02/17 0854 03/02/17 1227  BP:  (!) 117/8 104/80 121/89  Pulse:  (!) 107    Resp:  15    Temp:    97.7 F (36.5 C)  TempSrc:    Oral  SpO2:  92%  99%  Weight: 54.4 kg (119 lb  14.4 oz)     Height:        Intake/Output Summary (Last 24 hours) at 03/02/17 1258 Last data filed at 03/02/17 0855  Gross per 24 hour  Intake             1080 ml  Output             1950 ml  Net             -870 ml   Filed Weights   02/28/17 0419 03/01/17 0329 03/02/17 0626  Weight: 55.2 kg (121 lb 12.8 oz) 55.8 kg  (123 lb) 54.4 kg (119 lb 14.4 oz)    Examination:  General exam: Appears calm and comfortable  Respiratory system: Bilateral decreased breath sound at bases With scattered crackles Cardiovascular system: S1 & S2 heard, intermittent  tachycardia  Gastrointestinal system: Abdomen is nondistended, soft and nontender. Normal bowel sounds heard. Extremities: No cyanosis, clubbing, 1+ pitting pedal edema    Data Reviewed: I have personally reviewed following labs and imaging studies  CBC:  Recent Labs Lab 02/26/17 0529 02/27/17 0455 02/28/17 0700 03/01/17 0207 03/02/17 0557  WBC 6.7 6.4 8.2 8.1 10.1  NEUTROABS 5.7 5.4 7.1 7.3 8.6*  HGB 14.1 13.2 13.8 12.9* 14.4  HCT 44.8 41.9 41.4 40.0 44.7  MCV 90.9 89.5 88.3 88.9 89.9  PLT 123* 135* 140* 138* 161   Basic Metabolic Panel:  Recent Labs Lab 02/26/17 0529 02/27/17 0455 02/28/17 0700 02/28/17 2225 03/01/17 0207 03/01/17 0548 03/02/17 0557  NA 137 135 133* 131* 137 142 140  K 3.8 4.7 4.5 4.7 3.7 3.7 4.1  CL 96* 93* 89* 87* 96* 98* 97*  CO2 33* 33* 33* 34* 31 32 33*  GLUCOSE 101* 198* 300* 693* 307* 95 116*  BUN 29* 40* 52* 55* 52* 48* 57*  CREATININE 1.25* 1.38* 1.34* 1.79* 1.25* 1.05 1.24  CALCIUM 9.1 9.0 8.9 8.8* 8.5* 8.6* 9.1  MG 2.0 2.2 2.3  --  2.5*  --  2.6*   GFR: Estimated Creatinine Clearance: 41.4 mL/min (by C-G formula based on SCr of 1.24 mg/dL). Liver Function Tests:  Recent Labs Lab 02/27/17 0455 03/02/17 0557  AST 537* 195*  ALT 286* 202*  ALKPHOS 129* 123  BILITOT 0.9 0.7  PROT 5.3* 5.8*  ALBUMIN 2.5* 2.8*   No results for input(s): LIPASE, AMYLASE in the last 168 hours. No results for input(s): AMMONIA in the last 168 hours. Coagulation Profile: No results for input(s): INR, PROTIME in the last 168 hours. Cardiac Enzymes:  Recent Labs Lab 02/25/17 0438 02/25/17 1045  TROPONINI 0.15* 0.13*   BNP (last 3 results)  Recent Labs  01/20/17 1642  PROBNP 1,867.0*   HbA1C:  Recent  Labs  03/01/17 0548  HGBA1C 8.8*   CBG:  Recent Labs Lab 03/01/17 1228 03/01/17 1347 03/01/17 1638 03/01/17 2004 03/02/17 0747  GLUCAP 135* 200* 170* 285* 100*   Lipid Profile: No results for input(s): CHOL, HDL, LDLCALC, TRIG, CHOLHDL, LDLDIRECT in the last 72 hours. Thyroid Function Tests: No results for input(s): TSH, T4TOTAL, FREET4, T3FREE, THYROIDAB in the last 72 hours. Anemia Panel: No results for input(s): VITAMINB12, FOLATE, FERRITIN, TIBC, IRON, RETICCTPCT in the last 72 hours. Sepsis Labs:  Recent Labs Lab 02/26/17 1310 02/27/17 0455 02/28/17 0700  PROCALCITON 0.67 1.66 1.21    Recent Results (from the past 240 hour(s))  Respiratory Panel by PCR     Status: Abnormal   Collection Time: 02/24/17 11:59 PM  Result Value  Ref Range Status   Adenovirus NOT DETECTED NOT DETECTED Final   Coronavirus 229E NOT DETECTED NOT DETECTED Final   Coronavirus HKU1 NOT DETECTED NOT DETECTED Final   Coronavirus NL63 NOT DETECTED NOT DETECTED Final   Coronavirus OC43 NOT DETECTED NOT DETECTED Final   Metapneumovirus DETECTED (A) NOT DETECTED Final   Rhinovirus / Enterovirus NOT DETECTED NOT DETECTED Final   Influenza A NOT DETECTED NOT DETECTED Final   Influenza B NOT DETECTED NOT DETECTED Final   Parainfluenza Virus 1 NOT DETECTED NOT DETECTED Final   Parainfluenza Virus 2 NOT DETECTED NOT DETECTED Final   Parainfluenza Virus 3 NOT DETECTED NOT DETECTED Final   Parainfluenza Virus 4 NOT DETECTED NOT DETECTED Final   Respiratory Syncytial Virus NOT DETECTED NOT DETECTED Final   Bordetella pertussis NOT DETECTED NOT DETECTED Final   Chlamydophila pneumoniae NOT DETECTED NOT DETECTED Final   Mycoplasma pneumoniae NOT DETECTED NOT DETECTED Final  Culture, blood (routine x 2)     Status: None (Preliminary result)   Collection Time: 02/26/17  1:10 PM  Result Value Ref Range Status   Specimen Description BLOOD LEFT WRIST  Final   Special Requests   Final    BOTTLES DRAWN  AEROBIC AND ANAEROBIC Blood Culture results may not be optimal due to an excessive volume of blood received in culture bottles   Culture NO GROWTH 4 DAYS  Final   Report Status PENDING  Incomplete  Culture, blood (routine x 2)     Status: None (Preliminary result)   Collection Time: 02/26/17  1:11 PM  Result Value Ref Range Status   Specimen Description BLOOD RIGHT WRIST  Final   Special Requests   Final    BOTTLES DRAWN AEROBIC AND ANAEROBIC Blood Culture results may not be optimal due to an excessive volume of blood received in culture bottles   Culture NO GROWTH 4 DAYS  Final   Report Status PENDING  Incomplete  MRSA PCR Screening     Status: None   Collection Time: 02/27/17  8:32 PM  Result Value Ref Range Status   MRSA by PCR NEGATIVE NEGATIVE Final    Comment:        The GeneXpert MRSA Assay (FDA approved for NASAL specimens only), is one component of a comprehensive MRSA colonization surveillance program. It is not intended to diagnose MRSA infection nor to guide or monitor treatment for MRSA infections.          Radiology Studies: Dg Chest 2 View  Result Date: 03/02/2017 CLINICAL DATA:  Headache, weakness, history of pulmonary fibrosis, emphysema, CHF, former smoker. EXAM: CHEST  2 VIEW COMPARISON:  Chest x-ray of Feb 24, 2017 and chest CT scan of Feb 25, 2017. FINDINGS: The lungs are reasonably well inflated. The interstitial markings remain increased with areas of stable patchy confluence bilaterally. The cardiac silhouette is mildly enlarged. The pulmonary vascularity is not engorged. There is calcification in the wall of the aortic arch. There is no pleural effusion. IMPRESSION: Patchy confluent densities bilaterally not greatly changed from the previous study which may reflect acute pneumonia superimposed upon chronic fibrotic and emphysematous changes. No pulmonary edema. Thoracic aortic atherosclerosis. Electronically Signed   By: [name] M.D.   On: 03/02/2017  07:45        Scheduled Meds: . aspirin EC  81 mg Oral Daily  . atorvastatin  20 mg Oral Daily  . azithromycin  500 mg Oral Q24H  . enoxaparin (LOVENOX) injection  40 mg Subcutaneous Q24H  .  famotidine  20 mg Oral QHS  . feeding supplement (GLUCERNA SHAKE)  237 mL Oral TID BM  . furosemide  40 mg Oral BID  . guaiFENesin  1,200 mg Oral BID  . insulin aspart  0-5 Units Subcutaneous QHS  . insulin aspart  0-9 Units Subcutaneous TID WC  . insulin glargine  10 Units Subcutaneous Daily  . loratadine  10 mg Oral Daily  . mouth rinse  15 mL Mouth Rinse BID  . [START ON 03/03/2017] methylPREDNISolone (SOLU-MEDROL) injection  20 mg Intravenous Daily  . pantoprazole  40 mg Oral QAC breakfast  . potassium chloride SA  20 mEq Oral Daily  . sodium chloride flush  3 mL Intravenous Q12H  . terazosin  2 mg Oral QHS   Continuous Infusions: . sodium chloride    . cefTRIAXone (ROCEPHIN)  IV Stopped (03/01/17 1430)  . dextrose 5 % and 0.45% NaCl 25 mL/hr at 03/01/17 0252     LOS: 6 days        Aline August, MD [facility] Pager [phone]  If 7PM-7AM, please contact night-coverage [email or web address] Password [facility] 03/02/2017, 12:58 PM

## 2017-03-02 NOTE — Progress Notes (Signed)
Updated report received in patient's room via Human resources officer format, reviewed events of the day, new orders, VS and patient's condition, assumed care of patient.

## 2017-03-03 LAB — COMPREHENSIVE METABOLIC PANEL
ALT: 188 U/L — ABNORMAL HIGH (ref 17–63)
ANION GAP: 7 (ref 5–15)
AST: 162 U/L — ABNORMAL HIGH (ref 15–41)
Albumin: 2.6 g/dL — ABNORMAL LOW (ref 3.5–5.0)
Alkaline Phosphatase: 125 U/L (ref 38–126)
BUN: 49 mg/dL — ABNORMAL HIGH (ref 6–20)
CHLORIDE: 95 mmol/L — AB (ref 101–111)
CO2: 37 mmol/L — ABNORMAL HIGH (ref 22–32)
CREATININE: 1.16 mg/dL (ref 0.61–1.24)
Calcium: 8.9 mg/dL (ref 8.9–10.3)
Glucose, Bld: 110 mg/dL — ABNORMAL HIGH (ref 65–99)
POTASSIUM: 3.9 mmol/L (ref 3.5–5.1)
Sodium: 139 mmol/L (ref 135–145)
Total Bilirubin: 0.7 mg/dL (ref 0.3–1.2)
Total Protein: 5.6 g/dL — ABNORMAL LOW (ref 6.5–8.1)

## 2017-03-03 LAB — HEMOGLOBIN A1C
HEMOGLOBIN A1C: 9.9 % — AB (ref 4.8–5.6)
MEAN PLASMA GLUCOSE: 237 mg/dL

## 2017-03-03 LAB — CULTURE, BLOOD (ROUTINE X 2)
CULTURE: NO GROWTH
Culture: NO GROWTH

## 2017-03-03 LAB — GLUCOSE, CAPILLARY
GLUCOSE-CAPILLARY: 74 mg/dL (ref 65–99)
GLUCOSE-CAPILLARY: 91 mg/dL (ref 65–99)
GLUCOSE-CAPILLARY: 228 mg/dL — AB (ref 65–99)
Glucose-Capillary: 164 mg/dL — ABNORMAL HIGH (ref 65–99)

## 2017-03-03 LAB — CBC WITH DIFFERENTIAL/PLATELET
BASOS PCT: 0 %
Basophils Absolute: 0 10*3/uL (ref 0.0–0.1)
EOS PCT: 0 %
Eosinophils Absolute: 0 10*3/uL (ref 0.0–0.7)
HCT: 43.7 % (ref 39.0–52.0)
Hemoglobin: 13.8 g/dL (ref 13.0–17.0)
Lymphocytes Relative: 10 %
Lymphs Abs: 1 10*3/uL (ref 0.7–4.0)
MCH: 28.5 pg (ref 26.0–34.0)
MCHC: 31.6 g/dL (ref 30.0–36.0)
MCV: 90.3 fL (ref 78.0–100.0)
MONO ABS: 0.7 10*3/uL (ref 0.1–1.0)
Monocytes Relative: 7 %
NEUTROS ABS: 8 10*3/uL — AB (ref 1.7–7.7)
NEUTROS PCT: 83 %
PLATELETS: 154 10*3/uL (ref 150–400)
RBC: 4.84 MIL/uL (ref 4.22–5.81)
RDW: 16.4 % — ABNORMAL HIGH (ref 11.5–15.5)
WBC: 9.7 10*3/uL (ref 4.0–10.5)

## 2017-03-03 LAB — MAGNESIUM: MAGNESIUM: 2.5 mg/dL — AB (ref 1.7–2.4)

## 2017-03-03 MED ORDER — DEXTROSE 5 % IV SOLN
1.0000 g | INTRAVENOUS | Status: DC
  Administered 2017-03-03: 1 g via INTRAVENOUS
  Filled 2017-03-03 (×2): qty 10

## 2017-03-03 MED ORDER — METHYLPREDNISOLONE SODIUM SUCC 40 MG IJ SOLR
20.0000 mg | Freq: Two times a day (BID) | INTRAMUSCULAR | Status: DC
  Administered 2017-03-03 – 2017-03-04 (×2): 20 mg via INTRAVENOUS
  Filled 2017-03-03 (×2): qty 1

## 2017-03-03 NOTE — Progress Notes (Signed)
Physical Therapy Treatment Patient Details Name: Gilbert Dominguez MRN: [id number] DOB: 09-27-1946 Today's Date: 03/03/2017    History of Present Illness Gilbert Dominguez is a 71 y.o. male with medical history significant of pulmonary fibrosis, chronic respiratory failure oxygen dependent of 2 L, HTN, HLD, RA; who presents with complaints of shortness of breath progressively worsening over the last 3 days. Working dx in diastolic HF. 2/77 pt's breathin worsened in hospital and pt placed on Bipap.    PT Comments    Pt on 8L high flow O2 via nasal cannula demonstrating oxygen desaturation with mobility. Pt presented with poor activity tolerance today due to decreased O2 saturation during gait with associated symptoms of dizziness and fatigue. Pt only able to ambulate 10-82f before O2 saturation dropping between 77-79% requiring a standing rest break. Pt able to improve O2 saturation to 94-97% with pursed lip breathing and rest within 1-2 minutes and had decreased symptoms. Pt had to rest 4x during 658fgait trial due to dizziness and decreased O2 saturation, and had one LOB requiring min A to stabilize. Pt recommending supervision at home during mobility for safety. Pt would benefit from continued skilled PT to improve activity tolerance and balance.   Follow Up Recommendations  Home health PT;Supervision for mobility/OOB     Equipment Recommendations  None recommended by PT    Recommendations for Other Services       Precautions / Restrictions Precautions Precautions: Fall Precaution Comments: O2 saturation decreases quickly during mobility  Restrictions Weight Bearing Restrictions: No    Mobility  Bed Mobility               General bed mobility comments: received in chair   Transfers Overall transfer level: Needs assistance Equipment used: None Transfers: Sit to/from Stand Sit to Stand: Supervision         General transfer comment: supervision due to decreased  instability   Ambulation/Gait Ambulation/Gait assistance: Min assist Ambulation Distance (Feet): 60 Feet Assistive device: None Gait Pattern/deviations: Step-through pattern;Decreased stride length Gait velocity: decreased Gait velocity interpretation: Below normal speed for age/gender General Gait Details: Pt ambulated with 8L high flow O2 via nasal cannula. Pt able to ambulate ~10-1573fefore desaturating to 77% requiring a standing rest break with hand held support. Pt able to increase O2 saturation to high 90s with pursed lip breathing, however O2 continued to drop during gait with pt reporting fatigue and dizziness requiring multiple standing rest breaks throughout. Pt had LOB during gait requiring min A to stabilize and required seated rest break.    Stairs            Wheelchair Mobility    Modified Rankin (Stroke Patients Only)       Balance Overall balance assessment: Needs assistance Sitting-balance support: No upper extremity supported;Feet supported Sitting balance-Leahy Scale: Good     Standing balance support: No upper extremity supported;During functional activity Standing balance-Leahy Scale: Fair Standing balance comment: Pt demonstrates good static standing balance, but presents with instability during mobility.                             Cognition Arousal/Alertness: Awake/alert Behavior During Therapy: WFL for tasks assessed/performed Overall Cognitive Status: Within Functional Limits for tasks assessed  Exercises      General Comments General comments (skin integrity, edema, etc.): PT provided education about breathing through nose and recognizing signs of fatigue during gait.       Pertinent Vitals/Pain Pain Assessment: No/denies pain    Home Living                      Prior Function            PT Goals (current goals can now be found in the care plan section)  Acute Rehab PT Goals Patient Stated Goal: to go home  PT Goal Formulation: With patient Time For Goal Achievement: 03/13/17 Potential to Achieve Goals: Good Progress towards PT goals: Progressing toward goals    Frequency    Min 3X/week      PT Plan Current plan remains appropriate    Co-evaluation              AM-PAC PT "6 Clicks" Daily Activity  Outcome Measure  Difficulty turning over in bed (including adjusting bedclothes, sheets and blankets)?: None Difficulty moving from lying on back to sitting on the side of the bed? : None Difficulty sitting down on and standing up from a chair with arms (e.g., wheelchair, bedside commode, etc,.)?: A Little Help needed moving to and from a bed to chair (including a wheelchair)?: A Little Help needed walking in hospital room?: A Little Help needed climbing 3-5 steps with a railing? : A Lot 6 Click Score: 19    End of Session Equipment Utilized During Treatment: Gait belt;Oxygen Activity Tolerance: Patient limited by fatigue;Other (comment) (Rapid O2 desaturation to 77-79% after 10-54f of gait) Patient left: in chair;with call bell/phone within reach Nurse Communication: Mobility status;Other (comment) (O2 saturation ) PT Visit Diagnosis: Unsteadiness on feet (R26.81);Difficulty in walking, not elsewhere classified (R26.2)     Time: [address]PT Time Calculation (min) (ACUTE ONLY): 18 min  Charges:  $Gait Training: 8-22 mins                    G Codes:       TLoma Sousa SPT  #[phone]  TLoma Sousa5/16/2018, 12:09 PM

## 2017-03-03 NOTE — Care Management Note (Addendum)
Case Management Note  Patient Details  Name: Gilbert Dominguez MRN: [id number] Date of Birth: 1946-02-25  Subjective/Objective: Pt presented for CHF Exacerbation. Pt is from home alone and has a sister that will be staying with him for a while. Pt has DME 02/ Oxymizer at home. CM did reach out to [facility]ist Levada Dy @ [phone]) in regards to 02. New order faxed to [facility] for increased Liter flow @ [phone]. Pt has Oxymizer at home. Sister to bring portable E tank to hospital before d/c.                    Action/Plan: CM discussed [facility] with patient. Agency List provided and pt chose [facility]. CM did make referral with [facility] Liaison and [facility] to begin within 24-48 hours post d/c. Pt will need HHRN, PT, Aide Orders and F2F once stable for d/c.  No further needs from CM at this time.   Expected Discharge Date:                  Expected Discharge Plan:  [place]  In-House Referral:  NA  Discharge planning Services  CM Consult  Post Acute Care Choice:  Home Health, Durable Medical Equipment Choice offered to:  Patient  DME Arranged:  Oxygen DME Agency:  [facility] (New Order faxed for 6-8 L oxymizer)  HH Arranged:  RN, PT, Nurse's Aide [place] Agency:  [place]  Status of Service:  Completed, signed off  If discussed at [place] of Stay Meetings, dates discussed:  03-04-17  Additional Comments: 1043 03-04-17 Jacqlyn Krauss, RN,BSN [phone] CM did speak with pt in regards to [facility]- Discussed in [place] and pt meets qualifications for [facility] program. Agency for the week is [facility]. CM did see if was ok with patient to change agency. Pt is agreeable to services. SOC to begin within 24-48 hours post d/c.  Bethena Roys, RN 03/03/2017, 12:12 PM

## 2017-03-03 NOTE — Plan of Care (Signed)
Problem: Activity: Goal: Risk for activity intolerance will decrease Outcome: Not Progressing Pt became very dizzy while ambulating in hall due to hypoxia

## 2017-03-03 NOTE — Progress Notes (Signed)
Progress Note  Patient Name: Gilbert Dominguez Date of Encounter: 03/03/2017  Primary Cardiologist: Dr. Debara Pickett   Subjective   Feels better. Breathing improved. Denies CP.   Inpatient Medications    Scheduled Meds: . aspirin EC  81 mg Oral Daily  . atorvastatin  20 mg Oral Daily  . azithromycin  500 mg Oral Q24H  . enoxaparin (LOVENOX) injection  40 mg Subcutaneous Q24H  . famotidine  20 mg Oral QHS  . feeding supplement (GLUCERNA SHAKE)  237 mL Oral TID BM  . furosemide  40 mg Oral BID  . guaiFENesin  1,200 mg Oral BID  . insulin aspart  0-5 Units Subcutaneous QHS  . insulin aspart  0-9 Units Subcutaneous TID WC  . insulin glargine  10 Units Subcutaneous Daily  . loratadine  10 mg Oral Daily  . mouth rinse  15 mL Mouth Rinse BID  . methylPREDNISolone (SOLU-MEDROL) injection  20 mg Intravenous Daily  . pantoprazole  40 mg Oral QAC breakfast  . potassium chloride SA  20 mEq Oral Daily  . sodium chloride flush  3 mL Intravenous Q12H  . terazosin  2 mg Oral QHS   Continuous Infusions: . sodium chloride    . cefTRIAXone (ROCEPHIN)  IV Stopped (03/02/17 1445)  . dextrose 5 % and 0.45% NaCl 25 mL/hr at 03/01/17 0252   PRN Meds: sodium chloride, acetaminophen, ipratropium-albuterol, ondansetron (ZOFRAN) IV, sodium chloride flush   Vital Signs    Vitals:   03/02/17 1956 03/02/17 2248 03/03/17 0012 03/03/17 0310  BP:  (!) 145/98 136/88   Pulse:  66 73   Resp:  18 15   Temp: 97.8 F (36.6 C)  97.8 F (36.6 C)   TempSrc:   Axillary   SpO2:  96% 92%   Weight:    120 lb 1.6 oz (54.5 kg)  Height:        Intake/Output Summary (Last 24 hours) at 03/03/17 0647 Last data filed at 03/03/17 0400  Gross per 24 hour  Intake             1393 ml  Output             1850 ml  Net             -457 ml   Filed Weights   03/01/17 0329 03/02/17 0626 03/03/17 0310  Weight: 123 lb (55.8 kg) 119 lb 14.4 oz (54.4 kg) 120 lb 1.6 oz (54.5 kg)    Telemetry    NSR - Personally  Reviewed  ECG    NSR - Personally Reviewed  Physical Exam   GEN: thin appearing male in No acute distress.   Neck: No JVD Cardiac: RRR, no murmurs, rubs, or gallops.  Respiratory: bibasialr crackles on exam (may be 2/2 underlying pulmonary fibrosis) GI: Soft, nontender, non-distended  MS: 1+ ankle edema bilaterally; No deformity. Neuro:  Nonfocal  Psych: Normal affect   Labs    Chemistry Recent Labs Lab 02/27/17 0455  03/01/17 0548 03/02/17 0557 03/03/17 0512  NA 135  < > 142 140 139  K 4.7  < > 3.7 4.1 3.9  CL 93*  < > 98* 97* 95*  CO2 33*  < > 32 33* 37*  GLUCOSE 198*  < > 95 116* 110*  BUN 40*  < > 48* 57* 49*  CREATININE 1.38*  < > 1.05 1.24 1.16  CALCIUM 9.0  < > 8.6* 9.1 8.9  PROT 5.3*  --   --  5.8* 5.6*  ALBUMIN 2.5*  --   --  2.8* 2.6*  AST 537*  --   --  195* 162*  ALT 286*  --   --  202* 188*  ALKPHOS 129*  --   --  123 125  BILITOT 0.9  --   --  0.7 0.7  GFRNONAA 50*  < > >60 56* >60  GFRAA 57*  < > >60 >60 >60  ANIONGAP 9  < > _0 < > = values in this interval not displayed.   Hematology Recent Labs Lab 03/01/17 0207 03/02/17 0557 03/03/17 0512  WBC 8.1 10.1 9.7  RBC 4.50 4.97 4.84  HGB 12.9* 14.4 13.8  HCT 40.0 44.7 43.7  MCV 88.9 89.9 90.3  MCH 28.7 29.0 28.5  MCHC 32.3 32.2 31.6  RDW 16.6* 16.6* 16.4*  PLT 138* 159 154    Cardiac Enzymes Recent Labs Lab 02/25/17 0438 02/25/17 1045  TROPONINI 0.15* 0.13*    Recent Labs Lab 02/24/17 1947  TROPIPOC 0.10*     BNP Recent Labs Lab 02/24/17 1939  BNP 2,452.3*     DDimer No results for input(s): DDIMER in the last 168 hours.   Radiology    Dg Chest 2 View  Result Date: 03/02/2017 CLINICAL DATA:  Headache, weakness, history of pulmonary fibrosis, emphysema, CHF, former smoker. EXAM: CHEST  2 VIEW COMPARISON:  Chest x-ray of Feb 24, 2017 and chest CT scan of Feb 25, 2017. FINDINGS: The lungs are reasonably well inflated. The interstitial markings remain increased with  areas of stable patchy confluence bilaterally. The cardiac silhouette is mildly enlarged. The pulmonary vascularity is not engorged. There is calcification in the wall of the aortic arch. There is no pleural effusion. IMPRESSION: Patchy confluent densities bilaterally not greatly changed from the previous study which may reflect acute pneumonia superimposed upon chronic fibrotic and emphysematous changes. No pulmonary edema. Thoracic aortic atherosclerosis. Electronically Signed   By: [name] M.D.   On: 03/02/2017 07:45    Cardiac Studies   Prior to Admission>>Echo 12/08/16 Study Conclusions - Left ventricle: The cavity size was normal. Systolic function was normal. The estimated ejection fraction was in the range of 55% to 60%. Wall motion was normal; there were no regional wall motion abnormalities. Doppler parameters are consistent with abnormal left ventricular relaxation (+). - Ventricular septum: D-shaped interventricular septum suggestive of RV pressure/volume overload. - Aortic valve: Trileaflet; moderately calcified leaflets. There was mild stenosis. Mean gradient (S): 10 mm Hg. Valve area (VTI): 1.52 cm^2. - Mitral valve: There was no significant regurgitation. - Right ventricle: The cavity size was severely dilated. Systolic function was moderately reduced. - Right atrium: The atrium was moderately dilated. - Tricuspid valve: Peak RV-RA gradient (S): 61 mm Hg. - Systemic veins: IVC not visualized. - Pericardium, extracardiac: A trivial pericardial effusion was identified.  Impressions: - Normal LV size with EF 55-60%. D-shaped interventricular septum suggestive of RV pressure/volume overload. Severely dilated RV with moderate systolic dysfunction. Severe pulmonary hypertension. Mild aortic stenosis.  Patient Profile     71 y.o. male [name] ( ? Exposure to agent orange) with hx of chronic respiratory failure with hypoxia (on oxygen  24/7 - followed by Dr, Melvyn Novas), RA (previously treated with MTX - followed by Dr. Charlestine Night), prior 37 pack year hx of tobacco smoking (quit 1996), pHTN, pulmonary fibrosis and CKD stage III who is being seen for acute CHFat the request of Dr. Starla Link.   Assessment & Plan  1. Acute on Chronic Diastolic HF + Right Heart Failure: Admission BNP was markedly elevated at 2,452. Excellent diuresis, -7.3 L total. Significant reduction in weight from 133 lb on admit>>120 lb today. He was transitioned from IV>> PO lasix yesterday with continued good UOP w/ an additional 1.8L out in the past 24 hrs. Renal function and K both stable. He has bibasilar crackles on exam, although this may be 2/2 to his pulmonary fibrosis. He still has 1+ pitting bilateral ankle edema on exam. Continue 40 mg PO Lasix BID for continued diuresis. Pt will need to be compliant with daily weights, medication adherence and low sodium diet at home.   2. Acute on Chronic Hypoxic Respiratory Failure: multifactorial>>pHTN, pulmonary fibrosis, acute on chronic diastolic HF and right sided HF. Repeat CXR yesterday showed patchy confluent densities bilaterally not greatly changed from the previous study which may reflect acute pneumonia superimposed upon chronic fibrotic and emphysematous changes. No pulmonary edema. Continue to diuresis for CHF. IM to manage pulmonary issues.   3. Elevated Troponin: flat trend, 0.15>>0.13. No chest pain. EKG nonischemic. This is likely due to demand ischemia, in the setting of acute on chronic hypoxic respiratory failure/ acute CHF  4. Stage III CKD: SCr has remained stable with diuresis. 1.16 today.   5. HTN: controlled on current regimen.   6. HLD: on statin therapy, Lipitor 20 mg.   Signed, Lyda Jester, PA-C  03/03/2017, 6:47 AM     Attending Note:   The patient was seen and examined.  Agree with assessment and plan as noted above.  Changes made to the above note as needed.  Patient seen and  independently examined with Lyda Jester, PA .   We discussed all aspects of the encounter. I agree with the assessment and plan as stated above.  1. Acute on Chronic diastolic CHF:  Has diuresed well Now on PO lasix   2. REspiratory failure:   Has pulmonary fibrosis in addition to his diastolic CHF . Further plans per IM and his primary team     I have spent a total of 40 minutes with patient reviewing hospital  notes , telemetry, EKGs, labs and examining patient as well as establishing an assessment and plan that was discussed with the patient. > 50% of time was spent in direct patient care.  He is feeling about back to baseline. He can probably go home today or very soon   Thayer Headings, Brooke Bonito., MD, [facility] 03/03/2017, 10:12 AM [address],  [place] Pager [phone]

## 2017-03-03 NOTE — Progress Notes (Signed)
Patient ID: Gilbert Dominguez, male   DOB: Oct 04, 1946, 71 y.o.   MRN: [id number]  PROGRESS NOTE    Gilbert Dominguez  WCB:[id number] DOB: 1945-11-05 DOA: 02/24/2017 PCP: Leonard Downing, MD   Brief Narrative:  71 year old male with medical history of pulmonary fibrosis, chronic hypoxic respiratory failure on home oxygen 2 L at rest and up to 8 L on exertion, hypertension, dyslipidemia, RA presented with shortness of breath progressively worsening and significant weight gain. He was admitted with acute and chronic hypoxic respiratory failure and acute diastolic CHF exacerbation and positive troponins, started on Lasix intravenously. His oxygen requirement worsened so he was transferred to stepdown unit. He was evaluated by pulmonary and started on intravenous steroids and antibiotics. Cardiology has also evaluated the patient and patient is continued on IV Lasix. His symptoms are improving but still requiring significant amount of oxygen  Assessment & Plan:   1. Acute Diastolic CHF exacerbation:. -Cards consulted -diuresed with IV lasix -negative 7L -changed to po lasix -monitor I/O, weights  2. Acute on chronic respiratory failure with hypoxia  -due to pulmonary fibrosis, pulmonary artery hypertension and diastolic CHF as above -respiratory status is improving -diuresis as above -appreciate Pulm input, on IV Solu-Medrol and dose cut down and plan to switch to home dose of prednisone 20 mg daily in 1-2 days. Cultures have been negative so far.  -Respiratory panel waspositive for metapneumovirus. -Continue Duonebs -wean O2 as tolerated, uses 2L at baseline and 6L with activity  3. Elevated troponin: -due to demand ischemia from above/CHF  4. Chronic kidney disease stage III: - Creatinine stable, repeat BMP in a.m.  5. Essential hypertension:  -stable, continue Lasix and Terazosin  6. New diagnosis of diabetes mellitus type 2 with hyperglycemia:  -Hemoglobin A1c is 8.8.    -will need oral hypoglycemics at discharge and glucose testing supplies  7. GERD - Continue Pepcid and Protonix   8. Hypokalemia: Improved  9. Acute transaminitis  -due to passive hepatic congestion -monitor with diuresis, no abd symptoms  DVT prophylaxis:Lovenox Code Status:Full Family Communication:None present at bedside Disposition Plan:depends on condition improvement. Probable home in 1-2days  Consultants:Cardiology Pulmonary  Procedures:None Antimicrobials:Rocephin and Zithromax from 02/26/2017   Subjective: Breathing improving, close to baseline  Objective: Vitals:   03/02/17 2248 03/03/17 0012 03/03/17 0310 03/03/17 0755  BP: (!) 145/98 136/88  (!) 136/97  Pulse: 66 73  71  Resp: 18 15  (!) 22  Temp:  97.8 F (36.6 C)  97.7 F (36.5 C)  TempSrc:  Axillary  Oral  SpO2: 96% 92%  94%  Weight:   54.5 kg (120 lb 1.6 oz)   Height:        Intake/Output Summary (Last 24 hours) at 03/03/17 1353 Last data filed at 03/03/17 0852  Gross per 24 hour  Intake             1153 ml  Output             1450 ml  Net             -297 ml   Filed Weights   03/01/17 0329 03/02/17 0626 03/03/17 0310  Weight: 55.8 kg (123 lb) 54.4 kg (119 lb 14.4 oz) 54.5 kg (120 lb 1.6 oz)    Examination:  General exam: Appears calm and comfortable, no distress, oriented x3 Respiratory system: Bilateral decreased breath sound at bases Cardiovascular system: S1 & S2 heard/RRR Gastrointestinal system: Abdomen is nondistended, soft and nontender. Normal bowel sounds heard. Extremities:  No cyanosis, clubbing, trace edema    Data Reviewed: I have personally reviewed following labs and imaging studies  CBC:  Recent Labs Lab 02/27/17 0455 02/28/17 0700 03/01/17 0207 03/02/17 0557 03/03/17 0512  WBC 6.4 8.2 8.1 10.1 9.7  NEUTROABS 5.4 7.1 7.3 8.6* 8.0*  HGB 13.2 13.8 12.9* 14.4 13.8  HCT 41.9 41.4 40.0 44.7 43.7  MCV 89.5 88.3 88.9 89.9 90.3  PLT 135* 140* 138*  159 078   Basic Metabolic Panel:  Recent Labs Lab 02/27/17 0455 02/28/17 0700 02/28/17 2225 03/01/17 0207 03/01/17 0548 03/02/17 0557 03/03/17 0512  NA 135 133* 131* 137 142 140 139  K 4.7 4.5 4.7 3.7 3.7 4.1 3.9  CL 93* 89* 87* 96* 98* 97* 95*  CO2 33* 33* 34* 31 32 33* 37*  GLUCOSE 198* 300* 693* 307* 95 116* 110*  BUN 40* 52* 55* 52* 48* 57* 49*  CREATININE 1.38* 1.34* 1.79* 1.25* 1.05 1.24 1.16  CALCIUM 9.0 8.9 8.8* 8.5* 8.6* 9.1 8.9  MG 2.2 2.3  --  2.5*  --  2.6* 2.5*   GFR: Estimated Creatinine Clearance: 44.4 mL/min (by C-G formula based on SCr of 1.16 mg/dL). Liver Function Tests:  Recent Labs Lab 02/27/17 0455 03/02/17 0557 03/03/17 0512  AST 537* 195* 162*  ALT 286* 202* 188*  ALKPHOS 129* 123 125  BILITOT 0.9 0.7 0.7  PROT 5.3* 5.8* 5.6*  ALBUMIN 2.5* 2.8* 2.6*   No results for input(s): LIPASE, AMYLASE in the last 168 hours. No results for input(s): AMMONIA in the last 168 hours. Coagulation Profile: No results for input(s): INR, PROTIME in the last 168 hours. Cardiac Enzymes:  Recent Labs Lab 02/25/17 0438 02/25/17 1045  TROPONINI 0.15* 0.13*   BNP (last 3 results)  Recent Labs  01/20/17 1642  PROBNP 1,867.0*   HbA1C:  Recent Labs  03/01/17 0548 03/02/17 0557  HGBA1C 8.8* 9.9*   CBG:  Recent Labs Lab 03/02/17 1128 03/02/17 1616 03/02/17 2153 03/03/17 0756 03/03/17 1144  GLUCAP 339* 314* 188* 74 91   Lipid Profile: No results for input(s): CHOL, HDL, LDLCALC, TRIG, CHOLHDL, LDLDIRECT in the last 72 hours. Thyroid Function Tests: No results for input(s): TSH, T4TOTAL, FREET4, T3FREE, THYROIDAB in the last 72 hours. Anemia Panel: No results for input(s): VITAMINB12, FOLATE, FERRITIN, TIBC, IRON, RETICCTPCT in the last 72 hours. Sepsis Labs:  Recent Labs Lab 02/26/17 1310 02/27/17 0455 02/28/17 0700  PROCALCITON 0.67 1.66 1.21    Recent Results (from the past 240 hour(s))  Respiratory Panel by PCR     Status:  Abnormal   Collection Time: 02/24/17 11:59 PM  Result Value Ref Range Status   Adenovirus NOT DETECTED NOT DETECTED Final   Coronavirus 229E NOT DETECTED NOT DETECTED Final   Coronavirus HKU1 NOT DETECTED NOT DETECTED Final   Coronavirus NL63 NOT DETECTED NOT DETECTED Final   Coronavirus OC43 NOT DETECTED NOT DETECTED Final   Metapneumovirus DETECTED (A) NOT DETECTED Final   Rhinovirus / Enterovirus NOT DETECTED NOT DETECTED Final   Influenza A NOT DETECTED NOT DETECTED Final   Influenza B NOT DETECTED NOT DETECTED Final   Parainfluenza Virus 1 NOT DETECTED NOT DETECTED Final   Parainfluenza Virus 2 NOT DETECTED NOT DETECTED Final   Parainfluenza Virus 3 NOT DETECTED NOT DETECTED Final   Parainfluenza Virus 4 NOT DETECTED NOT DETECTED Final   Respiratory Syncytial Virus NOT DETECTED NOT DETECTED Final   Bordetella pertussis NOT DETECTED NOT DETECTED Final   Chlamydophila pneumoniae NOT DETECTED  NOT DETECTED Final   Mycoplasma pneumoniae NOT DETECTED NOT DETECTED Final  Culture, blood (routine x 2)     Status: None (Preliminary result)   Collection Time: 02/26/17  1:10 PM  Result Value Ref Range Status   Specimen Description BLOOD LEFT WRIST  Final   Special Requests   Final    BOTTLES DRAWN AEROBIC AND ANAEROBIC Blood Culture results may not be optimal due to an excessive volume of blood received in culture bottles   Culture NO GROWTH 4 DAYS  Final   Report Status PENDING  Incomplete  Culture, blood (routine x 2)     Status: None (Preliminary result)   Collection Time: 02/26/17  1:11 PM  Result Value Ref Range Status   Specimen Description BLOOD RIGHT WRIST  Final   Special Requests   Final    BOTTLES DRAWN AEROBIC AND ANAEROBIC Blood Culture results may not be optimal due to an excessive volume of blood received in culture bottles   Culture NO GROWTH 4 DAYS  Final   Report Status PENDING  Incomplete  MRSA PCR Screening     Status: None   Collection Time: 02/27/17  8:32 PM    Result Value Ref Range Status   MRSA by PCR NEGATIVE NEGATIVE Final    Comment:        The GeneXpert MRSA Assay (FDA approved for NASAL specimens only), is one component of a comprehensive MRSA colonization surveillance program. It is not intended to diagnose MRSA infection nor to guide or monitor treatment for MRSA infections.          Radiology Studies: Dg Chest 2 View  Result Date: 03/02/2017 CLINICAL DATA:  Headache, weakness, history of pulmonary fibrosis, emphysema, CHF, former smoker. EXAM: CHEST  2 VIEW COMPARISON:  Chest x-ray of Feb 24, 2017 and chest CT scan of Feb 25, 2017. FINDINGS: The lungs are reasonably well inflated. The interstitial markings remain increased with areas of stable patchy confluence bilaterally. The cardiac silhouette is mildly enlarged. The pulmonary vascularity is not engorged. There is calcification in the wall of the aortic arch. There is no pleural effusion. IMPRESSION: Patchy confluent densities bilaterally not greatly changed from the previous study which may reflect acute pneumonia superimposed upon chronic fibrotic and emphysematous changes. No pulmonary edema. Thoracic aortic atherosclerosis. Electronically Signed   By: [name] M.D.   On: 03/02/2017 07:45        Scheduled Meds: . aspirin EC  81 mg Oral Daily  . atorvastatin  20 mg Oral Daily  . azithromycin  500 mg Oral Q24H  . enoxaparin (LOVENOX) injection  40 mg Subcutaneous Q24H  . famotidine  20 mg Oral QHS  . feeding supplement (GLUCERNA SHAKE)  237 mL Oral TID BM  . furosemide  40 mg Oral BID  . guaiFENesin  1,200 mg Oral BID  . insulin aspart  0-5 Units Subcutaneous QHS  . insulin aspart  0-9 Units Subcutaneous TID WC  . insulin glargine  10 Units Subcutaneous Daily  . loratadine  10 mg Oral Daily  . mouth rinse  15 mL Mouth Rinse BID  . methylPREDNISolone (SOLU-MEDROL) injection  20 mg Intravenous Daily  . pantoprazole  40 mg Oral QAC breakfast  . potassium  chloride SA  20 mEq Oral Daily  . sodium chloride flush  3 mL Intravenous Q12H  . terazosin  2 mg Oral QHS   Continuous Infusions: . sodium chloride    . cefTRIAXone (ROCEPHIN)  IV  LOS: 7 days        Domenic Polite, MD [facility] Pager [phone]  If 7PM-7AM, please contact night-coverage [email or web address] Password [email or web address] 03/03/2017, 1:53 PM

## 2017-03-04 ENCOUNTER — Ambulatory Visit (HOSPITAL_COMMUNITY): Payer: Medicare Other

## 2017-03-04 DIAGNOSIS — I5032 Chronic diastolic (congestive) heart failure: Secondary | ICD-10-CM

## 2017-03-04 LAB — COMPREHENSIVE METABOLIC PANEL
ALBUMIN: 2.5 g/dL — AB (ref 3.5–5.0)
ALT: 147 U/L — ABNORMAL HIGH (ref 17–63)
AST: 112 U/L — AB (ref 15–41)
Alkaline Phosphatase: 129 U/L — ABNORMAL HIGH (ref 38–126)
Anion gap: 8 (ref 5–15)
BILIRUBIN TOTAL: 0.8 mg/dL (ref 0.3–1.2)
BUN: 38 mg/dL — AB (ref 6–20)
CO2: 33 mmol/L — AB (ref 22–32)
Calcium: 8.8 mg/dL — ABNORMAL LOW (ref 8.9–10.3)
Chloride: 98 mmol/L — ABNORMAL LOW (ref 101–111)
Creatinine, Ser: 0.87 mg/dL (ref 0.61–1.24)
GFR calc Af Amer: >60 mL/min (ref >60)
GFR calc non Af Amer: >60 mL/min (ref >60)
GLUCOSE: 153 mg/dL — AB (ref 65–99)
POTASSIUM: 3.7 mmol/L (ref 3.5–5.1)
Sodium: 139 mmol/L (ref 135–145)
TOTAL PROTEIN: 5.5 g/dL — AB (ref 6.5–8.1)

## 2017-03-04 LAB — GLUCOSE, CAPILLARY
Glucose-Capillary: 47 mg/dL — ABNORMAL LOW (ref 65–99)
Glucose-Capillary: 47 mg/dL — ABNORMAL LOW (ref 65–99)
Glucose-Capillary: 120 mg/dL — ABNORMAL HIGH (ref 65–99)

## 2017-03-04 MED ORDER — GLIPIZIDE-METFORMIN HCL 2.5-500 MG PO TABS: 1.0000 | ORAL_TABLET | Freq: Two times a day (BID) | ORAL | 0 refills | Status: DC

## 2017-03-04 MED ORDER — GLUCOSE 40 % PO GEL
ORAL | Status: AC
  Filled 2017-03-04: qty 1

## 2017-03-04 MED ORDER — FUROSEMIDE 20 MG PO TABS: 40.0000 mg | ORAL_TABLET | Freq: Two times a day (BID) | ORAL | 0 refills | Status: DC

## 2017-03-04 MED ORDER — PREDNISONE 10 MG PO TABS: 20.0000 mg | ORAL_TABLET | Freq: Every day | ORAL | 0 refills | Status: DC

## 2017-03-04 NOTE — Discharge Summary (Signed)
Physician Discharge Summary  Gilbert Dominguez FIE:[id number] DOB: 1945-12-30 DOA: 02/24/2017  PCP: Leonard Downing, MD  Admit date: 02/24/2017 Discharge date: 03/04/2017  Time spent: 35 minutes  Recommendations for Outpatient Follow-up:  1. [facility] Tammy Parrett 6/1 2. PCP in 1 week 3. 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  Hospital Course:  1. Acute Diastolic CHF exacerbation/RIght heart failure -Cards consulted, diuresed with IV lasix -negative 8.4L -changed to po lasix at 93m BID at discharge -monitor I/O, weights  2. Acute on chronic respiratory failure with hypoxia  -due to pulmonary fibrosis flare, + metapneumovirus and diastolic CHF/Pulm HTN as above -respiratory status much improved with diuresis and steroids -seen by Pulm this admission, treated with Abx, IV solumedrol for possible IPF flare transitioned from IV Solu-Medrol to home dose of prednisone 20 mg daily, completed 7days of  rocephin/zithromax -Continue Duonebs -weaned O2 to high flow 2-4L at rest and upto 8L with activity which is his baseline   3. Elevated troponin: -due to demand ischemia from above/CHF -flat trend, no ACS  4. Chronic kidney disease stage III: - Creatinine stable  5. Essential hypertension:  -stable, continue Lasix and Terazosin  6. New diagnosis of diabetes mellitus type 2 with hyperglycemia:  -Hemoglobin A1c is 8.8.  -started on glipizide/metformin at discharge  7. GERD - Continue Pepcid and Protonix   8. Hypokalemia: Improved  9. Acute transaminitis  -due to passive hepatic congestion -improving with diuresis, no abd symptoms -statin resumed  [facility]  Discharge Exam: Vitals:   03/04/17 0609 03/04/17 0757  BP: 138/88 137/83  Pulse: 64 71  Resp: (!) 21 (!) 21  Temp: 97.5 F (36.4 C) (!) 96.6 F (35.9 C)    General: AAOx3 Cardiovascular: SS2/RRR Respiratory: diffuse fine crackles  Discharge Instructions   Discharge Instructions    Diet - low sodium heart healthy    Complete by:  As directed    Diet - low sodium heart healthy    Complete by:  As directed    Diet Carb Modified    Complete by:  As directed    Increase activity slowly    Complete by:  As directed    Increase activity slowly    Complete by:  As directed      Current Discharge Medication List    START taking these medications   Details  glipiZIDE-metformin (METAGLIP) 2.5-500 MG tablet Take 1 tablet by mouth 2 (two) times daily before a meal. Qty:  60 tablet, Refills: 0      CONTINUE these medications which have CHANGED   Details  furosemide (LASIX) 20 MG tablet Take 2 tablets (40 mg total) by mouth 2 (two) times daily. Qty: 60 tablet, Refills: 0    predniSONE (DELTASONE) 10 MG tablet Take 2 tablets (20 mg total) by mouth daily with breakfast. Until FU with Pulmonary MD Qty: 30 tablet, Refills: 0      CONTINUE these medications which have NOT  CHANGED   Details  aspirin 81 MG tablet Take 81 mg by mouth daily.    atorvastatin (LIPITOR) 20 MG tablet Take 20 mg by mouth daily.    Calcium Carbonate-Vit D-Min (CALCIUM 600+D PLUS MINERALS PO) Take 1 tablet by mouth 2 (two) times daily.    famotidine (PEPCID) 20 MG tablet Take 1 tablet (20 mg total) by mouth at bedtime. Qty: 90 tablet, Refills: 1    loratadine (CLARITIN) 10 MG tablet Take 10 mg by mouth daily.    OXYGEN 4lpm 24/7 and titrate to keep sats above 90 with exertion  [employer]    pantoprazole (PROTONIX) 40 MG tablet TAKE 1 TABLET BY MOUTH EVERY DAY *TAKE 30-60 MINUTES BEFORE FIRST MEAL OF THE DAY* Qty: 30 tablet, Refills: 11    potassium chloride SA (K-DUR,KLOR-CON) 20 MEQ tablet Take 1 tablet (20 mEq total) by mouth daily. Qty: 30 tablet, Refills: 0    terazosin (HYTRIN) 1 MG capsule Take 2 capsules (2 mg total) by mouth at bedtime. Qty: 60 capsule, Refills: 0       Allergies  Allergen Reactions  . Lovastatin Rash   Follow-up Information    Juanito Doom, MD Follow up on 04/08/2017.   Specialty:  Pulmonary Disease Why:  Appt at 9:30 AM for evaluation of pulmonary hypertension Contact information: [place] [address] [phone]        Melvenia Needles, NP Follow up on 03/19/2017.   Specialty:  Pulmonary Disease Why:  Appt 10:30 AM for hospital follow up  Contact information: [address] [place] [address] [phone]        [facility]ists, [facility] Follow up.   Why:  Oxygen Contact information: [place] [address] [phone]        [facility] Follow up.   Why:  Registered Nurse, Physical Therapy, Aide.  Contact information: [address] [place] [address] [phone]            The results of significant diagnostics from this hospitalization (including imaging, microbiology, ancillary and laboratory) are listed below for reference.    Significant  Diagnostic Studies: Dg Chest 2 View  Result Date: 03/02/2017 CLINICAL DATA:  Headache, weakness, history of pulmonary fibrosis, emphysema, CHF, former smoker. EXAM: CHEST  2 VIEW COMPARISON:  Chest x-ray of Feb 24, 2017 and chest CT scan of Feb 25, 2017. FINDINGS: The lungs are reasonably well inflated. The interstitial markings remain increased with areas of stable patchy confluence bilaterally. The cardiac silhouette is mildly enlarged. The pulmonary vascularity is not engorged. There is calcification in the wall of the aortic arch. There is no pleural effusion. IMPRESSION: Patchy confluent densities bilaterally not greatly changed from the previous study which may reflect acute pneumonia superimposed upon chronic fibrotic and emphysematous changes. No pulmonary edema. Thoracic aortic atherosclerosis. Electronically Signed   By: [name] M.D.   On: 03/02/2017 07:45   Dg Chest 2 View  Result Date: 02/24/2017 CLINICAL DATA:  Acute  onset of shortness of breath. Current history of pulmonary fibrosis. Initial encounter. EXAM: CHEST  2 VIEW COMPARISON:  Chest radiograph performed 02/02/2017 FINDINGS: Underlying pulmonary fibrosis and chronic interstitial changes are relatively stable from the prior study. No definite superimposed focal airspace consolidation is seen. No pleural effusion or pneumothorax is identified. The heart is mildly enlarged. No acute osseous abnormalities are identified. IMPRESSION: Relatively stable appearance to pulmonary fibrosis and chronic interstitial changes. Mild cardiomegaly. No definite superimposed focal airspace consolidation seen. Electronically Signed   By: Garald Balding M.D.   On: 02/24/2017 20:46   Dg Chest 2 View  Result Date: 02/02/2017 CLINICAL DATA:  Routine.  Pulmonary fibrosis. EXAM: CHEST  2 VIEW COMPARISON:  01/19/2017, 11/19/2015, CT 12/08/2016 FINDINGS: Lungs are adequately inflated with chronic patchy increased interstitial changes within the mid to lower  lungs which are stable. No focal airspace process. No effusion. Known emphysematous disease. Stable cardiomegaly. Calcified plaque over the thoracic aorta. Degenerative change of the spine. IMPRESSION: No acute cardiopulmonary disease. 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 Cardiomegaly. Aortic atherosclerosis. Electronically Signed   By: Marin Olp M.D.   On: 02/02/2017 14:48   Ct Chest Wo Contrast  Result Date: 02/25/2017 CLINICAL DATA:  Respiratory failure. EXAM: CT CHEST WITHOUT CONTRAST TECHNIQUE: Multidetector CT imaging of the chest was performed following the standard protocol without IV contrast. COMPARISON:  Chest x-ray 02/24/2017.  CT chest 12/08/2016. FINDINGS: Cardiovascular: Heart is enlarged. Coronary artery calcification is noted. Atherosclerotic calcification is noted in the wall of the thoracic aorta. Right main pulmonary artery measures 3.7 cm diameter. Left main pulmonary artery measures 3.4 cm diameter. Mediastinum/Nodes: Scattered mediastinal lymph nodes are again noted with some mediastinal lymphadenopathy evident. 14 mm precarinal lymph nodes seen image 59 series for. Anterior left hilar 13 mm short axis lymph node evident. 17 mm short axis subcarinal lymph node is seen on image 84. There is no axillary lymphadenopathy. Lungs/Pleura: Centrilobular and paraseptal emphysema is noted in the lungs bilaterally. Areas of bronchiectasis and architectural distortion are seen in the lungs bilaterally. Lung bases demonstrate subpleural honeycombing with associated bronchiectasis. Confluent opacity along the right major fissure is stable. Upper Abdomen: Unremarkable. Musculoskeletal: Bone windows reveal no worrisome lytic or sclerotic osseous lesions. IMPRESSION: 1. No substantial change mediastinal lymphadenopathy, likely reactive. 2. Emphysema with interstitial changes and probable subpleural honeycombing at the bases. Features suggest a component of underlying  chronic interstitial disease (UIP). 3. Cardiomegaly with coronary artery atherosclerosis. 4. Enlarged pulmonary arteries compatible pulmonary arterial hypertension. Electronically Signed   By: Misty Stanley M.D.   On: 02/25/2017 15:10    Microbiology: Recent Results (from the past 240 hour(s))  Respiratory Panel by PCR     Status: Abnormal   Collection Time: 02/24/17 11:59 PM  Result Value Ref Range Status   Adenovirus NOT DETECTED NOT DETECTED Final   Coronavirus 229E NOT DETECTED NOT DETECTED Final   Coronavirus HKU1 NOT DETECTED NOT DETECTED Final   Coronavirus NL63 NOT DETECTED NOT DETECTED Final   Coronavirus OC43 NOT DETECTED NOT DETECTED Final   Metapneumovirus DETECTED (A) NOT DETECTED Final   Rhinovirus / Enterovirus NOT DETECTED NOT DETECTED Final   Influenza A NOT DETECTED NOT DETECTED Final   Influenza B NOT DETECTED NOT DETECTED Final   Parainfluenza Virus 1 NOT DETECTED NOT DETECTED Final   Parainfluenza Virus 2 NOT DETECTED NOT DETECTED Final   Parainfluenza Virus 3 NOT DETECTED NOT DETECTED Final   Parainfluenza Virus 4 NOT DETECTED NOT DETECTED Final  Respiratory Syncytial Virus NOT DETECTED NOT DETECTED Final   Bordetella pertussis NOT DETECTED NOT DETECTED Final   Chlamydophila pneumoniae NOT DETECTED NOT DETECTED Final   Mycoplasma pneumoniae NOT DETECTED NOT DETECTED Final  Culture, blood (routine x 2)     Status: None   Collection Time: 02/26/17  1:10 PM  Result Value Ref Range Status   Specimen Description BLOOD LEFT WRIST  Final   Special Requests   Final    BOTTLES DRAWN AEROBIC AND ANAEROBIC Blood Culture results may not be optimal due to an excessive volume of blood received in culture bottles   Culture NO GROWTH 5 DAYS  Final   Report Status 03/03/2017 FINAL  Final  Culture, blood (routine x 2)     Status: None   Collection Time: 02/26/17  1:11 PM  Result Value Ref Range Status   Specimen Description BLOOD RIGHT WRIST  Final   Special Requests   Final     BOTTLES DRAWN AEROBIC AND ANAEROBIC Blood Culture results may not be optimal due to an excessive volume of blood received in culture bottles   Culture NO GROWTH 5 DAYS  Final   Report Status 03/03/2017 FINAL  Final  MRSA PCR Screening     Status: None   Collection Time: 02/27/17  8:32 PM  Result Value Ref Range Status   MRSA by PCR NEGATIVE NEGATIVE Final    Comment:        The GeneXpert MRSA Assay (FDA approved for NASAL specimens only), is one component of a comprehensive MRSA colonization surveillance program. It is not intended to diagnose MRSA infection nor to guide or monitor treatment for MRSA infections.      Labs: Basic Metabolic Panel:  Recent Labs Lab 02/27/17 0455 02/28/17 0700  03/01/17 0207 03/01/17 0548 03/02/17 0557 03/03/17 0512 03/04/17 0346  NA 135 133*  < > 137 142 140 139 139  K 4.7 4.5  < > 3.7 3.7 4.1 3.9 3.7  CL 93* 89*  < > 96* 98* 97* 95* 98*  CO2 33* 33*  < > 31 32 33* 37* 33*  GLUCOSE 198* 300*  < > 307* 95 116* 110* 153*  BUN 40* 52*  < > 52* 48* 57* 49* 38*  CREATININE 1.38* 1.34*  < > 1.25* 1.05 1.24 1.16 0.87  CALCIUM 9.0 8.9  < > 8.5* 8.6* 9.1 8.9 8.8*  MG 2.2 2.3  --  2.5*  --  2.6* 2.5*  --   < > = values in this interval not displayed. Liver Function Tests:  Recent Labs Lab 02/27/17 0455 03/02/17 0557 03/03/17 0512 03/04/17 0346  AST 537* 195* 162* 112*  ALT 286* 202* 188* 147*  ALKPHOS 129* 123 125 129*  BILITOT 0.9 0.7 0.7 0.8  PROT 5.3* 5.8* 5.6* 5.5*  ALBUMIN 2.5* 2.8* 2.6* 2.5*   No results for input(s): LIPASE, AMYLASE in the last 168 hours. No results for input(s): AMMONIA in the last 168 hours. CBC:  Recent Labs Lab 02/27/17 0455 02/28/17 0700 03/01/17 0207 03/02/17 0557 03/03/17 0512  WBC 6.4 8.2 8.1 10.1 9.7  NEUTROABS 5.4 7.1 7.3 8.6* 8.0*  HGB 13.2 13.8 12.9* 14.4 13.8  HCT 41.9 41.4 40.0 44.7 43.7  MCV 89.5 88.3 88.9 89.9 90.3  PLT 135* 140* 138* 159 154   Cardiac Enzymes: No results for  input(s): CKTOTAL, CKMB, CKMBINDEX, TROPONINI in the last 168 hours. BNP: BNP (last 3 results)  Recent Labs  12/08/16 0406 02/24/17 1939  BNP  1,131.1* 2,452.3*    ProBNP (last 3 results)  Recent Labs  01/20/17 1642  PROBNP 1,867.0*    CBG:  Recent Labs Lab 03/03/17 1610 03/03/17 2135 03/04/17 0743 03/04/17 0805 03/04/17 0846  GLUCAP 164* 228* 47* 47* 120*       SignedDomenic Polite MD.  [facility] 03/04/2017, 11:36 AM

## 2017-03-04 NOTE — Progress Notes (Signed)
Progress Note  Patient Name: Gilbert Dominguez Date of Encounter: 03/04/2017  Primary Cardiologist: Dr. Debara Pickett   Subjective   Feels better. No complaints this morning.   Inpatient Medications    Scheduled Meds: . aspirin EC  81 mg Oral Daily  . atorvastatin  20 mg Oral Daily  . azithromycin  500 mg Oral Q24H  . enoxaparin (LOVENOX) injection  40 mg Subcutaneous Q24H  . famotidine  20 mg Oral QHS  . feeding supplement (GLUCERNA SHAKE)  237 mL Oral TID BM  . furosemide  40 mg Oral BID  . guaiFENesin  1,200 mg Oral BID  . insulin aspart  0-5 Units Subcutaneous QHS  . insulin aspart  0-9 Units Subcutaneous TID WC  . insulin glargine  10 Units Subcutaneous Daily  . loratadine  10 mg Oral Daily  . mouth rinse  15 mL Mouth Rinse BID  . methylPREDNISolone (SOLU-MEDROL) injection  20 mg Intravenous Q12H  . pantoprazole  40 mg Oral QAC breakfast  . potassium chloride SA  20 mEq Oral Daily  . sodium chloride flush  3 mL Intravenous Q12H  . terazosin  2 mg Oral QHS   Continuous Infusions: . sodium chloride    . cefTRIAXone (ROCEPHIN)  IV Stopped (03/03/17 1530)   PRN Meds: sodium chloride, acetaminophen, ipratropium-albuterol, ondansetron (ZOFRAN) IV, sodium chloride flush   Vital Signs    Vitals:   03/03/17 2300 03/04/17 0140 03/04/17 0609 03/04/17 0757  BP: 130/90  138/88 137/83  Pulse: 61 73 64 71  Resp: 15 20 (!) 21 (!) 21  Temp: 97.5 F (36.4 C)  97.5 F (36.4 C) (!) 96.6 F (35.9 C)  TempSrc: Axillary  Axillary Axillary  SpO2: 96% 90% 94% 98%  Weight:   119 lb 8 oz (54.2 kg)   Height:        Intake/Output Summary (Last 24 hours) at 03/04/17 0806 Last data filed at 03/04/17 0758  Gross per 24 hour  Intake             1400 ml  Output             2350 ml  Net             -950 ml   Filed Weights   03/02/17 0626 03/03/17 0310 03/04/17 0609  Weight: 119 lb 14.4 oz (54.4 kg) 120 lb 1.6 oz (54.5 kg) 119 lb 8 oz (54.2 kg)    Telemetry    NSR - Personally  Reviewed  ECG    NSR - Personally Reviewed  Physical Exam   GEN: No acute distress.   Neck: No JVD Cardiac: RRR, no murmurs, rubs, or gallops.  Respiratory:bibasilar crackles, likely secondary to pulmonary fibrosis. GI: Soft, nontender, non-distended  MS: 1+ ankle edema bilaterally; No deformity. Neuro:  Nonfocal  Psych: Normal affect   Labs    Chemistry Recent Labs Lab 03/02/17 0557 03/03/17 0512 03/04/17 0346  NA 140 139 139  K 4.1 3.9 3.7  CL 97* 95* 98*  CO2 33* 37* 33*  GLUCOSE 116* 110* 153*  BUN 57* 49* 38*  CREATININE 1.24 1.16 0.87  CALCIUM 9.1 8.9 8.8*  PROT 5.8* 5.6* 5.5*  ALBUMIN 2.8* 2.6* 2.5*  AST 195* 162* 112*  ALT 202* 188* 147*  ALKPHOS 123 125 129*  BILITOT 0.7 0.7 0.8  GFRNONAA 56* >60 >60  GFRAA >60 >60 >60  ANIONGAP _0 Hematology Recent Labs Lab 03/01/17 0207 03/02/17 0557 03/03/17  0512  WBC 8.1 10.1 9.7  RBC 4.50 4.97 4.84  HGB 12.9* 14.4 13.8  HCT 40.0 44.7 43.7  MCV 88.9 89.9 90.3  MCH 28.7 29.0 28.5  MCHC 32.3 32.2 31.6  RDW 16.6* 16.6* 16.4*  PLT 138* 159 154    Cardiac Enzymes Recent Labs Lab 02/25/17 1045  TROPONINI 0.13*   No results for input(s): TROPIPOC in the last 168 hours.   BNPNo results for input(s): BNP, PROBNP in the last 168 hours.   DDimer No results for input(s): DDIMER in the last 168 hours.   Radiology    No results found.  Cardiac Studies   Prior to Admission>>Echo 12/08/16 Study Conclusions - Left ventricle: The cavity size was normal. Systolic function was normal. The estimated ejection fraction was in the range of 55% to 60%. Wall motion was normal; there were no regional wall motion abnormalities. Doppler parameters are consistent with abnormal left ventricular relaxation (+). - Ventricular septum: D-shaped interventricular septum suggestive of RV pressure/volume overload. - Aortic valve: Trileaflet; moderately calcified leaflets. There was mild stenosis. Mean  gradient (S): 10 mm Hg. Valve area (VTI): 1.52 cm^2. - Mitral valve: There was no significant regurgitation. - Right ventricle: The cavity size was severely dilated. Systolic function was moderately reduced. - Right atrium: The atrium was moderately dilated. - Tricuspid valve: Peak RV-RA gradient (S): 61 mm Hg. - Systemic veins: IVC not visualized. - Pericardium, extracardiac: A trivial pericardial effusion was identified.  Impressions: - Normal LV size with EF 55-60%. D-shaped interventricular septum suggestive of RV pressure/volume overload. Severely dilated RV with moderate systolic dysfunction. Severe pulmonary hypertension. Mild aortic stenosis.  Patient Profile     71 [place] ( ? Exposure to agent orange) with hx of chronic respiratory failure with hypoxia (on oxygen 24/7 - followed by Dr, Melvyn Novas), RA (previously treated with MTX - followed by Dr. Charlestine Night), prior 37 pack year hx of tobacco smoking (quit 1996), pHTN, pulmonary fibrosis and CKD stage III who is being seen for acute CHFat the request of Dr. Starla Link.   Assessment & Plan     1. Acute on Chronic Diastolic HF + Right Sided Heart Failure: overall improved. Good diuresis with Lasix. I/Os net negative 8.5L. Weight is down 133>>119 lb. Still with slight bilateral ankle edema but improved from yesterday. Continue PO Lasix on discharge. He will need to be compliant with daily weights and low sodium diet at home.   2. Acute on Chronic Hypoxic Respiratory Failure: multifactorial>>pHTN, pulmonary fibrosis, acute on chronic diastolic HF and right sided HF. Condition improving with treatment. Continue to diuresis for CHF. IM to manage pulmonary issues.   3. Elevated Troponin: flat trend from 0.15>>0.13. EKG nonischemic. Denies CP. Enzyme leak likely secondary to demand ischemia from acute on chronic hypoxic respiratory failure + acute CHF. No indication for ischemic w/u.   4. CKD, Stage III: stable  with diuresis. SCr 0.87. BUN has improved from 57>>49>>38   5. HTN: appears to be stable and controlled on current regimen. Continue   6. HLD: treated with statin therapy. On 20 mg nightly.     Signed, Lyda Jester, PA-C  03/04/2017, 8:06 AM     Attending Note:   The patient was seen and examined.  Agree with assessment and plan as noted above.  Changes made to the above note as needed.  Patient seen and independently examined with Lyda Jester, PA .   We discussed all aspects of the encounter. I agree with the assessment and  plan as stated above.  1.  Acute on chronic diastolic CHF:   Steady improvement, He is stable for DC   2. Elevated Troponin:   Due to demand ischema.     I have spent a total of 20 minutes with patient reviewing hospital  notes , telemetry, EKGs, labs and examining patient as well as establishing an assessment and plan that was discussed with the patient. > 50% of time was spent in direct patient care.  Pt is ready for DC   Ramond Dial., MD, [facility] 03/04/2017, 10:11 AM [address]  [place] Pager [phone]

## 2017-03-05 NOTE — Progress Notes (Signed)
Pt was hospitalized shortly after appt with TP

## 2017-03-08 NOTE — Telephone Encounter (Signed)
Called patient to inform him of his sleep study scheduled for Monday May 10 2017.  LMTCB

## 2017-03-09 ENCOUNTER — Ambulatory Visit (HOSPITAL_COMMUNITY): Payer: Medicare Other

## 2017-03-11 ENCOUNTER — Ambulatory Visit (HOSPITAL_COMMUNITY): Payer: Medicare Other

## 2017-03-16 ENCOUNTER — Telehealth: Payer: Self-pay | Admitting: *Deleted

## 2017-03-16 NOTE — Telephone Encounter (Signed)
Informed patient of upcoming sleep study and patient understanding was verbalized. Patient understands his sleep study scheduled for Monday May 10 2017. Patient understands his sleep study will be done at [facility] sleep lab. Patient understands he will receive a sleep packet in a week or so. Patient understands to call if he does not receive the sleep packet in a timely manner. Patient agrees with treatment and thanked me for call            

## 2017-03-16 NOTE — Telephone Encounter (Signed)
Informed patient of upcoming sleep study and patient understanding was verbalized. Patient understands his sleep study scheduled for Monday May 10 2017. Patient understands his sleep study will be done at [facility] sleep lab. Patient understands he will receive a sleep packet in a week or so. Patient understands to call if he does not receive the sleep packet in a timely manner. Patient agrees with treatment and thanked me for call

## 2017-03-16 NOTE — Addendum Note (Signed)
Encounter addended by: Jewel Baize, RD on: 03/16/2017 11:31 AM<BR>    Actions taken: Flowsheet data copied forward, Visit Navigator Flowsheet section accepted

## 2017-03-19 ENCOUNTER — Inpatient Hospital Stay: Payer: Medicare Other | Admitting: Adult Health

## 2017-03-22 NOTE — Addendum Note (Signed)
Encounter addended by: Lance Morin, RN on: 03/22/2017 10:14 AM<BR>    Actions taken: Sign clinical note, Episode resolved

## 2017-03-22 NOTE — Progress Notes (Signed)
Discharge Summary  Patient Details  Name: Gilbert Dominguez MRN: [id number] Date of Birth: 09-24-1946 Referring Provider:     Pulmonary Rehab Walk Test from 08/11/2016 in [place]  Referring Provider  Dr. Melvyn Novas       Number of Visits: 31  Reason for Discharge:  Patient reached a stable level of exercise.  Smoking History:  History  Smoking Status  . Former Smoker  . Packs/day: 1.00  . Years: 36.00  . Types: Cigarettes  . Quit date: 10/14/1995  Smokeless Tobacco  . Never Used    Diagnosis:  Pulmonary fibrosis, postinflammatory (HCC)  ADL UCSD:   Initial Exercise Prescription:   Discharge Exercise Prescription (Final Exercise Prescription Changes):     Exercise Prescription Changes - 02/18/17 1500      Response to Exercise   Blood Pressure (Admit) 96/50   Blood Pressure (Exercise) 110/70   Blood Pressure (Exit) 110/60   Heart Rate (Admit) 92 bpm   Heart Rate (Exercise) 100 bpm   Heart Rate (Exit) 86 bpm   Oxygen Saturation (Admit) 98 %   Oxygen Saturation (Exercise) 94 %   Oxygen Saturation (Exit) 98 %   Rating of Perceived Exertion (Exercise) 13   Perceived Dyspnea (Exercise) 2   Duration Progress to 45 minutes of aerobic exercise without signs/symptoms of physical distress   Intensity THRR unchanged     Progression   Progression Continue to progress workloads to maintain intensity without signs/symptoms of physical distress.     Resistance Training   Training Prescription Yes   Weight green bands   Reps 10-15   Time 10 Minutes     Interval Training   Interval Training No     Oxygen   Oxygen Continuous   Liters 8     NuStep   Level 4   Minutes 17   METs 1.8     Track   Laps 5   Minutes 17      Functional Capacity:   Psychological, QOL, Others - Outcomes: PHQ 2/9: Depression screen PHQ 2/9 08/07/2016  Decreased Interest 0  Down, Depressed, Hopeless 0  PHQ - 2 Score 0    Quality of  Life:   Personal Goals: Goals established at orientation with interventions provided to work toward goal.    Personal Goals Discharge:     Goals and Risk Factor Review    Row Name 10/05/16 1419 11/09/16 1432 12/01/16 0900 12/24/16 0911 01/25/17 1440     Core Components/Risk Factors/Patient Goals Review   Personal Goals Review Increase Strength and Stamina;Develop more efficient breathing techniques such as purse lipped breathing and diaphragmatic breathing and practicing self-pacing with activity.;Improve shortness of breath with ADL's;Increase knowledge of respiratory medications and ability to use respiratory devices properly. Increase Strength and Stamina;Develop more efficient breathing techniques such as purse lipped breathing and diaphragmatic breathing and practicing self-pacing with activity.;Improve shortness of breath with ADL's;Increase knowledge of respiratory medications and ability to use respiratory devices properly. Increase Strength and Stamina;Develop more efficient breathing techniques such as purse lipped breathing and diaphragmatic breathing and practicing self-pacing with activity.;Improve shortness of breath with ADL's;Increase knowledge of respiratory medications and ability to use respiratory devices properly. Develop more efficient breathing techniques such as purse lipped breathing and diaphragmatic breathing and practicing self-pacing with activity.;Improve shortness of breath with ADL's;Increase knowledge of respiratory medications and ability to use respiratory devices properly. Develop more efficient breathing techniques such as purse lipped breathing and diaphragmatic breathing and practicing self-pacing with activity.;Improve  shortness of breath with ADL's   Review has been sick the last week, equipment has stayed the same, will increase levels when recovered from illness. Becoming more knowledgeable re: oxygen use and how to monitor his oxygen saturations,  progressing, purse lip breathing improving Has definitely met his goals, impressed with what he has learned while in program.  will graduate in 2 more sessions recent hospitalization for CHF, gained 10 pounds over a week.  Lost the 10 pounds in hospital, oxygen needs have increased.  Using 15 liters while exercising  continues to have CHF issues despite Lasix, oxygen requirements are continuing to increase   Expected Outcomes continue to progress when recovered to continue meeting goals with time in program to continue exercising independently after discharge will extend to 36 sessions due to need to strength after hospitalization progress as tolerated   Row Name 02/22/17 1155             Core Components/Risk Factors/Patient Goals Review   Personal Goals Review Develop more efficient breathing techniques such as purse lipped breathing and diaphragmatic breathing and practicing self-pacing with activity.;Improve shortness of breath with ADL's       Review CHF seems to be controlled at this time, tolerating exercise well, using oxygen as prescribed.       Expected Outcomes Coninue exercising after graduation          Nutrition & Weight - Outcomes:    Nutrition:   Nutrition Discharge:   Education Questionnaire Score:   Goals reviewed with patient; copy given to patient.

## 2017-04-01 ENCOUNTER — Telehealth: Payer: Self-pay | Admitting: Internal Medicine

## 2017-04-01 NOTE — Telephone Encounter (Signed)
Pt had appt with [facility] 6[facility]29 moved to 6-19 per Dr. Arelia Sneddon, high pulse rate up to the 200's, what would we like to do or have them do before then? Pls call Christy or Dr. Arelia Sneddon

## 2017-04-01 NOTE — Telephone Encounter (Signed)
Tried to call back Dr Arelia Sneddon office, unable to leave message office is closed.

## 2017-04-06 ENCOUNTER — Ambulatory Visit (INDEPENDENT_AMBULATORY_CARE_PROVIDER_SITE_OTHER): Payer: Medicare Other | Admitting: Internal Medicine

## 2017-04-06 ENCOUNTER — Encounter: Payer: Self-pay | Admitting: Internal Medicine

## 2017-04-06 VITALS — BP 100/68 | HR 86 | Ht 63.0 in | Wt 127.0 lb

## 2017-04-06 DIAGNOSIS — I272 Pulmonary hypertension, unspecified: Secondary | ICD-10-CM | POA: Diagnosis not present

## 2017-04-06 DIAGNOSIS — I5033 Acute on chronic diastolic (congestive) heart failure: Secondary | ICD-10-CM

## 2017-04-06 DIAGNOSIS — R000 Tachycardia, unspecified: Secondary | ICD-10-CM

## 2017-04-06 DIAGNOSIS — I10 Essential (primary) hypertension: Secondary | ICD-10-CM | POA: Diagnosis not present

## 2017-04-06 DIAGNOSIS — I2729 Other secondary pulmonary hypertension: Secondary | ICD-10-CM

## 2017-04-06 DIAGNOSIS — I50810 Right heart failure, unspecified: Secondary | ICD-10-CM

## 2017-04-06 MED ORDER — FUROSEMIDE 20 MG PO TABS: 60.0000 mg | ORAL_TABLET | Freq: Two times a day (BID) | ORAL | 3 refills | Status: DC

## 2017-04-06 NOTE — Patient Instructions (Signed)
Your physician has recommended you make the following change in your medication:  -- INCREASE lasix to 65m twice daily  Your physician has recommended that you wear an event monitor for 30 days. Event monitors are medical devices that record the heart's electrical activity. Doctors most often uKoreathese monitors to diagnose arrhythmias. Arrhythmias are problems with the speed or rhythm of the heartbeat. The monitor is a small, portable device. You can wear one while you do your normal daily activities. This is usually used to diagnose what is causing palpitations/syncope (passing out).  Your physician [place]

## 2017-04-06 NOTE — Telephone Encounter (Signed)
Patient to see Dr. Debara Pickett today for office eval.

## 2017-04-08 ENCOUNTER — Encounter: Payer: Self-pay | Admitting: Pulmonary Disease

## 2017-04-08 ENCOUNTER — Ambulatory Visit (INDEPENDENT_AMBULATORY_CARE_PROVIDER_SITE_OTHER): Payer: Medicare Other | Admitting: Pulmonary Disease

## 2017-04-08 ENCOUNTER — Other Ambulatory Visit (INDEPENDENT_AMBULATORY_CARE_PROVIDER_SITE_OTHER): Payer: Medicare Other

## 2017-04-08 VITALS — BP 96/60 | HR 85 | Ht 63.0 in | Wt 127.0 lb

## 2017-04-08 DIAGNOSIS — M05731 Rheumatoid arthritis with rheumatoid factor of right wrist without organ or systems involvement: Secondary | ICD-10-CM | POA: Diagnosis not present

## 2017-04-08 DIAGNOSIS — I272 Pulmonary hypertension, unspecified: Secondary | ICD-10-CM | POA: Diagnosis not present

## 2017-04-08 DIAGNOSIS — J9611 Chronic respiratory failure with hypoxia: Secondary | ICD-10-CM

## 2017-04-08 DIAGNOSIS — J849 Interstitial pulmonary disease, unspecified: Secondary | ICD-10-CM | POA: Diagnosis not present

## 2017-04-08 DIAGNOSIS — I2721 Secondary pulmonary arterial hypertension: Secondary | ICD-10-CM

## 2017-04-08 DIAGNOSIS — M05732 Rheumatoid arthritis with rheumatoid factor of left wrist without organ or systems involvement: Secondary | ICD-10-CM

## 2017-04-08 DIAGNOSIS — J432 Centrilobular emphysema: Secondary | ICD-10-CM

## 2017-04-08 DIAGNOSIS — R000 Tachycardia, unspecified: Secondary | ICD-10-CM | POA: Insufficient documentation

## 2017-04-08 LAB — HIV ANTIBODY (ROUTINE TESTING W REFLEX): HIV: NONREACTIVE

## 2017-04-08 LAB — TSH: TSH: 4.39 u[IU]/mL (ref 0.35–4.50)

## 2017-04-08 NOTE — Patient Instructions (Signed)
For your chronic respiratory failure with hypoxemia Keep using oxygen at 4 L at rest, 8 L on exertion We will do a walking test today in the office to make sure that a liters is sufficient while walking  For your pulmonary hypertension: Continue taking diuretics as directed by Dr. Debara Pickett Your target weight should be 117 pounds I am going to make sure you don't have evidence of HIV, thyroid disease, chronic thromboembolic pulmonary hypertension (blood clots) and sleep apnea by ordering blood work, a VQ scan, and following up your sleep study We will consider treatment with a pulmonary vasodilator after I seen the results of the workup above.  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 Melvyn Novas  I will see you back in 2 months after the sleep study

## 2017-04-08 NOTE — Progress Notes (Signed)
Subjective:    Patient ID: Gilbert Dominguez, male    DOB: Nov 18, 1945, 71 y.o.   MRN: [id number]   Synopsis: Referred to [facility] in June 2018 in the setting of pulmonary hypertension. He has baseline pulmonary fibrosis, rheumatoid arthritis, chronic restrictive failure with hypoxemia, diastolic heart failure.  HPI Chief Complaint  Patient presents with  . Advice Only    Referred by MW for a second opinion on pulm htn.     Gilbert Dominguez is here to see me for an evaluation for pulmonary hypertension.   He has dyspnea: > OK at rest > worse when he walks > he uses his oxygen at 8L when he walks, 4 L at rest > he just can't go more than 100 feet on level ground > this has worsened slowly in the last year, stable in the last 3-4 months > he hasn't been in rehab in quite some time > the last time he went to a gym for exercise was around April-May 2018  Chronic respiratory failure with hypoxemia: > now using 4L O2 at rest, 8Lpm with exertion > he needed less a year go > he uses it regularly  Leg swelling > has been going on quite a while > has been worse in the last few weeks > he can barely get his shoes on > he doesn't think it was that bad a year ago  Tobacco abuse > quit 22 years ago > started age 8, smoked 1.5ppd for 37 years  Rheumatoid arthritis: > previously followed by Dr. Charlestine Night, recently retired > was on methotrexate years ago > took it for 3.5 years, quit taking in 2016 > now treated on rheumatoid arthritis > he has mild hand and wrist pain right now which is the extent of his disease  Pulmonary hypertension: > never had a blood clot > has a sleep study pending July 23 for sleep apnea> his wife says he snores a little, not constant, he typically feels well rested    Past Medical History:  Diagnosis Date  . CHF exacerbation ([place]) 02/24/2017  . Chronic respiratory failure ([place])   . Hypertension   . Pulmonary fibrosis, postinflammatory ([place])   . Rheumatoid  arthritis(714.0)      Family History  Problem Relation Age of Onset  . Heart disease Father   . Emphysema Father   . Liver cancer Mother      Social History   Social History  . Marital status: Single    Spouse name: N/A  . Number of children: 2  . Years of education: N/A   Occupational History  . Retired Optometrist    Social History Main Topics  . Smoking status: Former Smoker    Packs/day: 1.50    Years: 37.00    Types: Cigarettes    Quit date: 10/14/1995  . Smokeless tobacco: Never Used  . Alcohol use 1.8 oz/week    3 Cans of beer per week     Comment: occasionally  . Drug use: No  . Sexual activity: Not on file   Other Topics Concern  . Not on file   Social History Narrative  . No narrative on file     Allergies  Allergen Reactions  . Lovastatin Rash     Outpatient Medications Prior to Visit  Medication Sig Dispense Refill  . aspirin 81 MG tablet Take 81 mg by mouth daily.    Marland Kitchen atorvastatin (LIPITOR) 20 MG tablet Take 20 mg by mouth daily.    Marland Kitchen  Calcium Carbonate-Vit D-Min (CALCIUM 600+D PLUS MINERALS PO) Take 1 tablet by mouth 2 (two) times daily.    Marland Kitchen dextromethorphan-guaiFENesin (MUCINEX DM) 30-600 MG 12hr tablet Take 1 tablet by mouth 2 (two) times daily as needed (cough, congestion).    . famotidine (PEPCID) 20 MG tablet Take 1 tablet (20 mg total) by mouth at bedtime. 90 tablet 1  . furosemide (LASIX) 20 MG tablet Take 3 tablets (60 mg total) by mouth 2 (two) times daily. 180 tablet 3  . glipiZIDE-metformin (METAGLIP) 2.5-500 MG tablet Take 1 tablet by mouth 2 (two) times daily before a meal. 60 tablet 0  . loratadine (CLARITIN) 10 MG tablet Take 10 mg by mouth daily.    . OXYGEN 4lpm 24/7 and titrate to keep sats above 90 with exertion  [facility]    . pantoprazole (PROTONIX) 40 MG tablet TAKE 1 TABLET BY MOUTH EVERY DAY *TAKE 30-60 MINUTES BEFORE FIRST MEAL OF THE DAY* 30 tablet 11  . potassium chloride SA (K-DUR,KLOR-CON) 20 MEQ tablet Take 1 tablet  (20 mEq total) by mouth daily. 30 tablet 0  . predniSONE (DELTASONE) 10 MG tablet Take 2 tablets (20 mg total) by mouth daily with breakfast. Until FU with Pulmonary MD 30 tablet 0  . terazosin (HYTRIN) 1 MG capsule Take 2 capsules (2 mg total) by mouth at bedtime. 60 capsule 0   No facility-administered medications prior to visit.       Review of Systems  Constitutional: Negative for fever and unexpected weight change.  HENT: Negative for congestion, dental problem, ear pain, nosebleeds, postnasal drip, rhinorrhea, sinus pressure, sneezing, sore throat and trouble swallowing.   Eyes: Negative for redness and itching.  Respiratory: Positive for shortness of breath. Negative for cough, chest tightness and wheezing.   Cardiovascular: Negative for palpitations and leg swelling.  Gastrointestinal: Negative for nausea and vomiting.  Genitourinary: Negative for dysuria.  Musculoskeletal: Negative for joint swelling.  Skin: Negative for rash.  Neurological: Negative for headaches.  Hematological: Does not bruise/bleed easily.  Psychiatric/Behavioral: Negative for dysphoric mood. The patient is not nervous/anxious.        Objective:   Physical Exam Vitals:   04/08/17 0947  BP: 96/60  Pulse: 85  SpO2: 95%   4L [place]  Gen: chronically ill appearing, no acute distress HENT: NCAT, OP clear, neck supple without masses Eyes: PERRL, EOMi Lymph: no cervical lymphadenopathy PULM: Crackles 2/3 way up bilaterally, no wheezing CV: RRR, systolic murmur, no JVD GI: BS+, soft, nontender, no hsm Derm: no rash or skin breakdown, massive bilateral leg edema MSK: normal bulk and tone Neuro: A&Ox4, CN II-XII intact, strength 5/5 in all 4 extremities Psyche: normal mood and affect   Echocardiogram: January 2018 echo: severely dilated right ventricle, moderate systolic RV dysfunction, severe pulmonary hypertension and mild aortic valve stenosis. LVEF 55-60%  Chest imaging: 2018 high-resolution CT  scan of the chest reveals significant emphysema, paraseptal emphysema, patchy groundglass, clear fibrotic changes worse in the bases but more significant traction bronchiolectasis and no clear honeycombing on my review. Esophagus mildly patulous. adenopathy noted.  PFT: April 2018 PFT: Ratio 89%, FVC 2.24 L 68% predicted, total lung capacity 2.88 L 50% predicted, DLCO 8.38 L 35% predicted  Records: Cardiology records reviewed from 2 days ago, he was noted to have acute on chronic diastolic heart failure and his Lasix was adjusted. Discharge summary from May 2018 reviewed were he was admitted for acute on chronic respiratory failure with hypoxemia, treated with prednisone.  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 Katherine has rheumatoid arthritis which does not appear to be very active right now with just prednisone use and worsening pulmonary hypertension with findings of right heart failure in the setting of diastolic heart failure and severe pulmonary fibrosis with concomitant emphysema.  In regards to his fibrosis, this is connective tissue disease associated and imaging findings are not consistent with usual interstitial pneumonitis. We should continue follow-up with rheumatology for now. I see no indication to ramp up immunosuppressive therapy for this right now.  Generally I would not consider pulmonary vasodilators for patient with such severe lung disease and diastolic heart failure but with his rheumatoid arthritis it may be worth considering. I want to complete the workup for pulmonary hypertension  first before making a decision about a trial of pulmonary vasodilator. The hallmarks of therapy regardless of pulmonary vasodilator use will remain using adequate amounts of oxygen, exercising as much as possible, and using diuretics to get his weight down to a dry weight (I believe 117 pounds).  Plan: For your chronic respiratory failure with hypoxemia Keep using oxygen at 4 L at rest, 8 L on exertion We will do a walking test today in the office to make sure that a liters is sufficient while walking  For your pulmonary hypertension: Continue taking diuretics as directed by Dr. Debara Pickett Your target weight should be 117 pounds I am going to make sure you don't have evidence of HIV, thyroid disease, chronic thromboembolic pulmonary hypertension (blood clots) and sleep apnea by ordering blood work, a VQ scan, and following up your sleep study We will consider treatment with a pulmonary vasodilator after I seen the results of the workup above.  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 Melvyn Novas  I will see you back in 2 months after the sleep study   Current Outpatient Prescriptions:  .  aspirin 81 MG tablet, Take 81 mg by mouth daily., Disp: , Rfl:  .  atorvastatin (LIPITOR) 20 MG tablet, Take 20 mg by mouth daily., Disp: , Rfl:  .  Calcium Carbonate-Vit D-Min (CALCIUM 600+D PLUS MINERALS PO), Take 1 tablet by mouth 2 (two) times daily., Disp: , Rfl:  .  dextromethorphan-guaiFENesin (MUCINEX DM) 30-600 MG 12hr tablet, Take 1 tablet by mouth 2 (two) times daily as needed (cough, congestion)., Disp: , Rfl:  .  famotidine (PEPCID) 20 MG tablet, Take 1 tablet (20 mg total) by mouth at bedtime., Disp: 90 tablet, Rfl: 1 .  furosemide (LASIX) 20 MG tablet, Take 3 tablets (60 mg total) by mouth 2 (two) times daily., Disp: 180 tablet, Rfl: 3 .  glipiZIDE-metformin (METAGLIP) 2.5-500 MG tablet, Take 1 tablet by  mouth 2 (two) times daily before a meal., Disp: 60 tablet, Rfl: 0 .  loratadine (CLARITIN) 10 MG tablet, Take 10 mg by mouth daily., Disp: , Rfl:  .  OXYGEN, 4lpm 24/7 and titrate to keep sats above 90 with exertion  [facility], Disp: , Rfl:  .  pantoprazole (PROTONIX) 40 MG tablet, TAKE 1 TABLET BY MOUTH EVERY DAY *TAKE 30-60 MINUTES BEFORE FIRST  MEAL OF THE DAY*, Disp: 30 tablet, Rfl: 11 .  potassium chloride SA (K-DUR,KLOR-CON) 20 MEQ tablet, Take 1 tablet (20 mEq total) by mouth daily., Disp: 30 tablet, Rfl: 0 .  predniSONE (DELTASONE) 10 MG tablet, Take 2 tablets (20 mg total) by mouth daily with breakfast. Until FU with Pulmonary MD, Disp: 30 tablet, Rfl: 0 .  terazosin (HYTRIN) 2 MG capsule, Take 2 mg by mouth at bedtime., Disp: , Rfl:

## 2017-04-08 NOTE — Progress Notes (Signed)
OFFICE FOLLOW-UP NOTE  Chief Complaint:  Intermittent tachycardia  Primary Care Physician: Leonard Downing, MD  HPI:  Tameron Lama is a 71 y.o. male who was recently hospitalized for acute and chronic respiratory failure and acute on chronic diastolic congestive heart failure. Mr. Harkins is a [place] veteran with possible prior agent orange exposure (he is not yet established with the [facility] hospital). He has a history of chronic respiratory failure, rheumatoid arthritis and extensive tobacco history but quit in 1996. He also has pulmonary hypertension, pulmonary fibrosis and stage III chronic kidney disease. He presented with acute congestive heart failure, hypoxia and significant dyspnea. Recent echo in February 2018 demonstrates predominantly right heart failure with severely dilated right ventricle, moderate systolic RV dysfunction, severe pulmonary hypertension and mild aortic valve stenosis. He was recently diagnosed with human metapneumovirus as well. He underwent aggressive diuresis but ultimately did not require right heart catheterization. Echocardiogram showed an EF of 55-60%, normal wall motion and grade 1 diastolic dysfunction. RVSP was elevated at 61 mmHg. He was discharged home on oxygen from 2-4 L up to 8 L with activity. He reports since being home that he is feeling better although goes to oxygen fairly quickly. Unfortunately is not a candidate for an oxygen concentrator that the portable because of all provide high enough flow rate. He did have an elevated troponin which was thought due to demand ischemia. He denies any recurrent chest pain. He reports some intermittent tachycardia. He diuresed down no 119 pounds overweight today's 127 pounds. He says this is mostly related to close and equipment. He says his weight at home is around 123 pounds and is been stable.  Past Medical History:  Diagnosis Date  . CHF exacerbation ([place]) 02/24/2017  . Chronic respiratory failure  ([place])   . Hypertension   . Pulmonary fibrosis, postinflammatory ([place])   . Rheumatoid arthritis(714.0)     Past Surgical History:  Procedure Laterality Date  . ACNE CYST REMOVAL  1968  . VASECTOMY  1973    FAMHx:  Family History  Problem Relation Age of Onset  . Heart disease Father   . Liver cancer Mother     SOCHx:   reports that he quit smoking about 21 years ago. His smoking use included Cigarettes. He has a 36.00 pack-year smoking history. He has never used smokeless tobacco. He reports that he drinks about 1.8 oz of alcohol per week . He reports that he does not use drugs.  ALLERGIES:  Allergies  Allergen Reactions  . Lovastatin Rash    ROS: Pertinent items noted in HPI and remainder of comprehensive ROS otherwise negative.  HOME MEDS: Current Outpatient Prescriptions on File Prior to Visit  Medication Sig Dispense Refill  . aspirin 81 MG tablet Take 81 mg by mouth daily.    Marland Kitchen atorvastatin (LIPITOR) 20 MG tablet Take 20 mg by mouth daily.    . Calcium Carbonate-Vit D-Min (CALCIUM 600+D PLUS MINERALS PO) Take 1 tablet by mouth 2 (two) times daily.    Marland Kitchen dextromethorphan-guaiFENesin (MUCINEX DM) 30-600 MG 12hr tablet Take 1 tablet by mouth 2 (two) times daily as needed (cough, congestion).    . famotidine (PEPCID) 20 MG tablet Take 1 tablet (20 mg total) by mouth at bedtime. 90 tablet 1  . glipiZIDE-metformin (METAGLIP) 2.5-500 MG tablet Take 1 tablet by mouth 2 (two) times daily before a meal. 60 tablet 0  . loratadine (CLARITIN) 10 MG tablet Take 10 mg by mouth daily.    Marland Kitchen  OXYGEN 4lpm 24/7 and titrate to keep sats above 90 with exertion  [facility]    . pantoprazole (PROTONIX) 40 MG tablet TAKE 1 TABLET BY MOUTH EVERY DAY *TAKE 30-60 MINUTES BEFORE FIRST MEAL OF THE DAY* 30 tablet 11  . potassium chloride SA (K-DUR,KLOR-CON) 20 MEQ tablet Take 1 tablet (20 mEq total) by mouth daily. 30 tablet 0  . predniSONE (DELTASONE) 10 MG tablet Take 2 tablets (20 mg total) by mouth  daily with breakfast. Until FU with Pulmonary MD 30 tablet 0  . terazosin (HYTRIN) 1 MG capsule Take 2 capsules (2 mg total) by mouth at bedtime. (Patient taking differently: Take 1 mg by mouth at bedtime. ) 60 capsule 0   No current facility-administered medications on file prior to visit.     LABS/IMAGING: No results found for this or any previous visit (from the past 48 hour(s)). No results found.  LIPID PANEL: No results found for: CHOL, TRIG, HDL, CHOLHDL, VLDL, LDLCALC, LDLDIRECT   WEIGHTS: Wt Readings from Last 3 Encounters:  04/06/17 127 lb (57.6 kg)  03/04/17 119 lb 8 oz (54.2 kg)  02/23/17 133 lb 9.6 oz (60.6 kg)    VITALS: BP 100/68   Pulse 86   Ht _0  (1.6 m)   Wt 127 lb (57.6 kg)   BMI 22.50 kg/m   EXAM: General appearance: alert, appears older than stated age, no distress and Thin and somewhat frail Neck: no carotid bruit and no JVD Lungs: diminished breath sounds bilaterally and rales bilaterally Heart: regular rate and rhythm Abdomen: soft, non-tender; bowel sounds normal; no masses,  no organomegaly Extremities: edema 2+ lower extremity pitting edema Pulses: 2+ and symmetric Skin: Skin color, texture, turgor normal. No rashes or lesions Neurologic: Grossly normal Psych: Pleasant  EKG: Deferred  ASSESSMENT: 1. Acute on chronic diastolic congestive heart failure 2. Chronic respiratory failure/primary fibrosis 3. Recent human bite a virus infection 4. CKD 3 5. Newly diagnosed type 2 diabetes-11 A1c 8.8 6. Essential hypertension  PLAN: 1.   Mr. Watling has had some weight gain since discharge and appears to be somewhat volume overloaded again today. He has notable lower extremity edema. I advised increasing his Lasix up to 60 mg twice a day from 40 mg twice a day. He's also noted some intermittent tachycardia and a concern this could be atrial fibrillation. I like to place a 30 day monitor to see if we can pick up on any abnormal arrhythmias.  Follow-up with me in 4-6 weeks.  Pixie Casino, MD, [place]  Attending Cardiologist  Direct Dial: [phone]  Fax: [phone]  Website:  [email or web address] 04/08/2017, 7:58 AM

## 2017-04-12 ENCOUNTER — Ambulatory Visit (HOSPITAL_COMMUNITY)
Admission: RE | Admit: 2017-04-12 | Discharge: 2017-04-12 | Disposition: A | Discharge status: Home / Self Care | Payer: Medicare Other | Source: Ambulatory Visit | Attending: Pulmonary Disease | Admitting: Pulmonary Disease

## 2017-04-12 ENCOUNTER — Encounter (HOSPITAL_COMMUNITY)
Admission: RE | Admit: 2017-04-12 | Discharge: 2017-04-12 | Disposition: A | Discharge status: Home / Self Care | Payer: Medicare Other | Source: Ambulatory Visit | Attending: Pulmonary Disease | Admitting: Pulmonary Disease

## 2017-04-12 DIAGNOSIS — I272 Pulmonary hypertension, unspecified: Secondary | ICD-10-CM | POA: Insufficient documentation

## 2017-04-12 MED ORDER — TECHNETIUM TO 99M ALBUMIN AGGREGATED
4.0000 | Freq: Once | INTRAVENOUS | Status: AC | PRN
  Administered 2017-04-12: 4 via INTRAVENOUS

## 2017-04-12 MED ORDER — TECHNETIUM TC 99M DIETHYLENETRIAME-PENTAACETIC ACID: 30.0000 | Freq: Once | INTRAVENOUS | Status: DC

## 2017-04-12 MED ORDER — TECHNETIUM TC 99M DIETHYLENETRIAME-PENTAACETIC ACID: 30.0000 | Freq: Once | INTRAVENOUS | Status: DC | PRN

## 2017-04-16 ENCOUNTER — Ambulatory Visit: Payer: Medicare Other | Admitting: Internal Medicine

## 2017-04-20 ENCOUNTER — Ambulatory Visit (INDEPENDENT_AMBULATORY_CARE_PROVIDER_SITE_OTHER): Payer: Medicare Other

## 2017-04-20 DIAGNOSIS — R000 Tachycardia, unspecified: Secondary | ICD-10-CM | POA: Diagnosis not present

## 2017-04-27 ENCOUNTER — Telehealth: Payer: Self-pay | Admitting: Internal Medicine

## 2017-04-27 NOTE — Telephone Encounter (Signed)
Frankey Poot calling back to verify oxygen use - he can be reached at [phone] -pr

## 2017-04-27 NOTE — Telephone Encounter (Signed)
Spoke with Frankey Poot. He needed to know if the patient was still using oxygen. Advised that patient is using 4L at rest and 8L on exertion. Nothing else was needed at time of call.

## 2017-04-28 ENCOUNTER — Encounter (HOSPITAL_COMMUNITY): Payer: Self-pay

## 2017-04-28 ENCOUNTER — Emergency Department (HOSPITAL_COMMUNITY): Payer: Medicare Other

## 2017-04-28 ENCOUNTER — Inpatient Hospital Stay (HOSPITAL_COMMUNITY)
Admission: EM | Admit: 2017-04-28 | Discharge: 2017-05-06 | DRG: 291 | Disposition: A | Discharge status: Home Health | Payer: Medicare Other | Attending: Family Medicine | Admitting: Family Medicine

## 2017-04-28 DIAGNOSIS — D72829 Elevated white blood cell count, unspecified: Secondary | ICD-10-CM | POA: Diagnosis not present

## 2017-04-28 DIAGNOSIS — R000 Tachycardia, unspecified: Secondary | ICD-10-CM | POA: Diagnosis not present

## 2017-04-28 DIAGNOSIS — I4892 Unspecified atrial flutter: Secondary | ICD-10-CM

## 2017-04-28 DIAGNOSIS — M069 Rheumatoid arthritis, unspecified: Secondary | ICD-10-CM | POA: Diagnosis present

## 2017-04-28 DIAGNOSIS — Z87891 Personal history of nicotine dependence: Secondary | ICD-10-CM | POA: Diagnosis not present

## 2017-04-28 DIAGNOSIS — Z888 Allergy status to other drugs, medicaments and biological substances status: Secondary | ICD-10-CM

## 2017-04-28 DIAGNOSIS — I509 Heart failure, unspecified: Secondary | ICD-10-CM

## 2017-04-28 DIAGNOSIS — J9611 Chronic respiratory failure with hypoxia: Secondary | ICD-10-CM | POA: Diagnosis present

## 2017-04-28 DIAGNOSIS — I4891 Unspecified atrial fibrillation: Secondary | ICD-10-CM | POA: Diagnosis present

## 2017-04-28 DIAGNOSIS — I471 Supraventricular tachycardia: Secondary | ICD-10-CM

## 2017-04-28 DIAGNOSIS — R7989 Other specified abnormal findings of blood chemistry: Secondary | ICD-10-CM

## 2017-04-28 DIAGNOSIS — I248 Other forms of acute ischemic heart disease: Secondary | ICD-10-CM | POA: Diagnosis present

## 2017-04-28 DIAGNOSIS — I082 Rheumatic disorders of both aortic and tricuspid valves: Secondary | ICD-10-CM | POA: Diagnosis present

## 2017-04-28 DIAGNOSIS — T380X5A Adverse effect of glucocorticoids and synthetic analogues, initial encounter: Secondary | ICD-10-CM | POA: Diagnosis not present

## 2017-04-28 DIAGNOSIS — I5082 Biventricular heart failure: Secondary | ICD-10-CM | POA: Diagnosis present

## 2017-04-28 DIAGNOSIS — I360 Nonrheumatic tricuspid (valve) stenosis: Secondary | ICD-10-CM | POA: Diagnosis not present

## 2017-04-28 DIAGNOSIS — I272 Pulmonary hypertension, unspecified: Secondary | ICD-10-CM

## 2017-04-28 DIAGNOSIS — R739 Hyperglycemia, unspecified: Secondary | ICD-10-CM

## 2017-04-28 DIAGNOSIS — Z8249 Family history of ischemic heart disease and other diseases of the circulatory system: Secondary | ICD-10-CM | POA: Diagnosis not present

## 2017-04-28 DIAGNOSIS — E876 Hypokalemia: Secondary | ICD-10-CM | POA: Diagnosis not present

## 2017-04-28 DIAGNOSIS — I4719 Other supraventricular tachycardia: Secondary | ICD-10-CM | POA: Diagnosis present

## 2017-04-28 DIAGNOSIS — E11649 Type 2 diabetes mellitus with hypoglycemia without coma: Secondary | ICD-10-CM | POA: Diagnosis present

## 2017-04-28 DIAGNOSIS — R1031 Right lower quadrant pain: Secondary | ICD-10-CM | POA: Diagnosis not present

## 2017-04-28 DIAGNOSIS — I130 Hypertensive heart and chronic kidney disease with heart failure and stage 1 through stage 4 chronic kidney disease, or unspecified chronic kidney disease: Principal | ICD-10-CM | POA: Diagnosis present

## 2017-04-28 DIAGNOSIS — I5043 Acute on chronic combined systolic (congestive) and diastolic (congestive) heart failure: Secondary | ICD-10-CM | POA: Diagnosis present

## 2017-04-28 DIAGNOSIS — I2729 Other secondary pulmonary hypertension: Secondary | ICD-10-CM | POA: Diagnosis present

## 2017-04-28 DIAGNOSIS — M7989 Other specified soft tissue disorders: Secondary | ICD-10-CM | POA: Diagnosis not present

## 2017-04-28 DIAGNOSIS — J8410 Pulmonary fibrosis, unspecified: Secondary | ICD-10-CM | POA: Diagnosis not present

## 2017-04-28 DIAGNOSIS — Z79899 Other long term (current) drug therapy: Secondary | ICD-10-CM

## 2017-04-28 DIAGNOSIS — I4510 Unspecified right bundle-branch block: Secondary | ICD-10-CM | POA: Diagnosis present

## 2017-04-28 DIAGNOSIS — Z9981 Dependence on supplemental oxygen: Secondary | ICD-10-CM | POA: Diagnosis not present

## 2017-04-28 DIAGNOSIS — Z7952 Long term (current) use of systemic steroids: Secondary | ICD-10-CM

## 2017-04-28 DIAGNOSIS — E1122 Type 2 diabetes mellitus with diabetic chronic kidney disease: Secondary | ICD-10-CM | POA: Diagnosis present

## 2017-04-28 DIAGNOSIS — N183 Chronic kidney disease, stage 3 (moderate): Secondary | ICD-10-CM | POA: Diagnosis present

## 2017-04-28 DIAGNOSIS — E1165 Type 2 diabetes mellitus with hyperglycemia: Secondary | ICD-10-CM | POA: Diagnosis present

## 2017-04-28 DIAGNOSIS — I5033 Acute on chronic diastolic (congestive) heart failure: Secondary | ICD-10-CM | POA: Diagnosis not present

## 2017-04-28 DIAGNOSIS — Z7984 Long term (current) use of oral hypoglycemic drugs: Secondary | ICD-10-CM

## 2017-04-28 DIAGNOSIS — Z7982 Long term (current) use of aspirin: Secondary | ICD-10-CM

## 2017-04-28 DIAGNOSIS — R0602 Shortness of breath: Secondary | ICD-10-CM | POA: Diagnosis not present

## 2017-04-28 DIAGNOSIS — M79609 Pain in unspecified limb: Secondary | ICD-10-CM | POA: Diagnosis not present

## 2017-04-28 DIAGNOSIS — Z825 Family history of asthma and other chronic lower respiratory diseases: Secondary | ICD-10-CM

## 2017-04-28 DIAGNOSIS — I50813 Acute on chronic right heart failure: Secondary | ICD-10-CM | POA: Diagnosis not present

## 2017-04-28 LAB — COMPREHENSIVE METABOLIC PANEL
ALBUMIN: 3.1 g/dL — AB (ref 3.5–5.0)
ALT: 39 U/L (ref 17–63)
ANION GAP: 12 (ref 5–15)
AST: 73 U/L — ABNORMAL HIGH (ref 15–41)
Alkaline Phosphatase: 88 U/L (ref 38–126)
BUN: 40 mg/dL — ABNORMAL HIGH (ref 6–20)
CO2: 28 mmol/L (ref 22–32)
Calcium: 9 mg/dL (ref 8.9–10.3)
Chloride: 101 mmol/L (ref 101–111)
Creatinine, Ser: 1.15 mg/dL (ref 0.61–1.24)
GFR calc non Af Amer: >60 mL/min (ref >60)
GLUCOSE: 116 mg/dL — AB (ref 65–99)
POTASSIUM: 3.4 mmol/L — AB (ref 3.5–5.1)
SODIUM: 141 mmol/L (ref 135–145)
TOTAL PROTEIN: 6.6 g/dL (ref 6.5–8.1)
Total Bilirubin: 2.3 mg/dL — ABNORMAL HIGH (ref 0.3–1.2)

## 2017-04-28 LAB — CBC WITH DIFFERENTIAL/PLATELET
BASOS ABS: 0 10*3/uL (ref 0.0–0.1)
Basophils Relative: 0 %
Eosinophils Absolute: 0 10*3/uL (ref 0.0–0.7)
Eosinophils Relative: 0 %
HCT: 40 % (ref 39.0–52.0)
HEMOGLOBIN: 13 g/dL (ref 13.0–17.0)
LYMPHS PCT: 2 %
Lymphs Abs: 0.3 10*3/uL — ABNORMAL LOW (ref 0.7–4.0)
MCH: 29.8 pg (ref 26.0–34.0)
MCHC: 32.5 g/dL (ref 30.0–36.0)
MCV: 91.7 fL (ref 78.0–100.0)
MONO ABS: 0.5 10*3/uL (ref 0.1–1.0)
MONOS PCT: 5 %
NEUTROS ABS: 11.1 10*3/uL — AB (ref 1.7–7.7)
NEUTROS PCT: 93 %
Platelets: 197 10*3/uL (ref 150–400)
RBC: 4.36 MIL/uL (ref 4.22–5.81)
RDW: 18.2 % — AB (ref 11.5–15.5)
WBC: 11.8 10*3/uL — ABNORMAL HIGH (ref 4.0–10.5)

## 2017-04-28 LAB — PROTIME-INR
INR: 1.33
Prothrombin Time: 16.6 seconds — ABNORMAL HIGH (ref 11.4–15.2)

## 2017-04-28 LAB — D-DIMER, QUANTITATIVE: D-Dimer, Quant: 0.9 ug/mL-FEU — ABNORMAL HIGH (ref 0.00–0.50)

## 2017-04-28 LAB — GLUCOSE, CAPILLARY: Glucose-Capillary: 101 mg/dL — ABNORMAL HIGH (ref 65–99)

## 2017-04-28 LAB — BRAIN NATRIURETIC PEPTIDE: B NATRIURETIC PEPTIDE 5: 2262.7 pg/mL — AB (ref 0.0–100.0)

## 2017-04-28 MED ORDER — SODIUM CHLORIDE 0.9 % IV SOLN: 250.0000 mL | INTRAVENOUS | Status: DC | PRN

## 2017-04-28 MED ORDER — ENOXAPARIN SODIUM 40 MG/0.4ML ~~LOC~~ SOLN
40.0000 mg | Freq: Every day | SUBCUTANEOUS | Status: DC
  Administered 2017-04-29: 40 mg via SUBCUTANEOUS
  Filled 2017-04-28: qty 0.4

## 2017-04-28 MED ORDER — INSULIN ASPART 100 UNIT/ML ~~LOC~~ SOLN
0.0000 [IU] | Freq: Three times a day (TID) | SUBCUTANEOUS | Status: DC
  Administered 2017-04-29: 1 [IU] via SUBCUTANEOUS
  Administered 2017-04-30: 3 [IU] via SUBCUTANEOUS
  Administered 2017-04-30: 1 [IU] via SUBCUTANEOUS
  Administered 2017-05-01: 2 [IU] via SUBCUTANEOUS
  Administered 2017-05-01: 1 [IU] via SUBCUTANEOUS

## 2017-04-28 MED ORDER — METFORMIN HCL 500 MG PO TABS
500.0000 mg | ORAL_TABLET | Freq: Two times a day (BID) | ORAL | Status: DC
  Administered 2017-04-29 (×2): 500 mg via ORAL
  Filled 2017-04-28 (×2): qty 1

## 2017-04-28 MED ORDER — POTASSIUM CHLORIDE CRYS ER 20 MEQ PO TBCR
20.0000 meq | EXTENDED_RELEASE_TABLET | Freq: Every day | ORAL | Status: DC
  Administered 2017-04-29 – 2017-05-02 (×4): 20 meq via ORAL
  Filled 2017-04-28 (×4): qty 1

## 2017-04-28 MED ORDER — FUROSEMIDE 10 MG/ML IJ SOLN
20.0000 mg | Freq: Every day | INTRAMUSCULAR | Status: DC
  Administered 2017-04-29: 20 mg via INTRAVENOUS
  Filled 2017-04-28: qty 2

## 2017-04-28 MED ORDER — PRO-STAT SUGAR FREE PO LIQD
30.0000 mL | Freq: Two times a day (BID) | ORAL | Status: DC
  Administered 2017-04-28 – 2017-04-29 (×2): 30 mL via ORAL
  Administered 2017-04-29: 22:00:00 via ORAL
  Administered 2017-04-30 – 2017-05-06 (×13): 30 mL via ORAL
  Filled 2017-04-28 (×16): qty 30

## 2017-04-28 MED ORDER — POTASSIUM CHLORIDE CRYS ER 20 MEQ PO TBCR
20.0000 meq | EXTENDED_RELEASE_TABLET | Freq: Once | ORAL | Status: AC
  Administered 2017-04-28: 20 meq via ORAL
  Filled 2017-04-28: qty 1

## 2017-04-28 MED ORDER — PREDNISONE 20 MG PO TABS
20.0000 mg | ORAL_TABLET | Freq: Every day | ORAL | Status: DC
  Administered 2017-04-29 – 2017-05-06 (×8): 20 mg via ORAL
  Filled 2017-04-28 (×8): qty 1

## 2017-04-28 MED ORDER — SODIUM CHLORIDE 0.9% FLUSH
3.0000 mL | Freq: Two times a day (BID) | INTRAVENOUS | Status: DC
  Administered 2017-04-30 – 2017-05-05 (×10): 3 mL via INTRAVENOUS

## 2017-04-28 MED ORDER — ATORVASTATIN CALCIUM 20 MG PO TABS
20.0000 mg | ORAL_TABLET | Freq: Every day | ORAL | Status: DC
  Administered 2017-04-29 – 2017-05-06 (×8): 20 mg via ORAL
  Filled 2017-04-28 (×8): qty 1

## 2017-04-28 MED ORDER — FAMOTIDINE 20 MG PO TABS
20.0000 mg | ORAL_TABLET | Freq: Every day | ORAL | Status: DC
  Administered 2017-04-28 – 2017-05-05 (×8): 20 mg via ORAL
  Filled 2017-04-28 (×8): qty 1

## 2017-04-28 MED ORDER — ADENOSINE 6 MG/2ML IV SOLN
6.0000 mg | Freq: Once | INTRAVENOUS | Status: AC
  Administered 2017-04-28: 6 mg via INTRAVENOUS
  Filled 2017-04-28: qty 2

## 2017-04-28 MED ORDER — GLIPIZIDE 5 MG PO TABS
2.5000 mg | ORAL_TABLET | Freq: Two times a day (BID) | ORAL | Status: DC
  Administered 2017-04-29 (×2): 2.5 mg via ORAL
  Filled 2017-04-28 (×2): qty 1

## 2017-04-28 MED ORDER — PANTOPRAZOLE SODIUM 40 MG PO TBEC
40.0000 mg | DELAYED_RELEASE_TABLET | Freq: Every day | ORAL | Status: DC
  Administered 2017-04-29 – 2017-05-06 (×8): 40 mg via ORAL
  Filled 2017-04-28 (×8): qty 1

## 2017-04-28 MED ORDER — SODIUM CHLORIDE 0.9 % IV SOLN
INTRAVENOUS | Status: DC
  Administered 2017-04-28: 1000 mL via INTRAVENOUS

## 2017-04-28 MED ORDER — GLIPIZIDE-METFORMIN HCL 2.5-500 MG PO TABS: 1.0000 | ORAL_TABLET | Freq: Two times a day (BID) | ORAL | Status: DC

## 2017-04-28 MED ORDER — INSULIN ASPART 100 UNIT/ML ~~LOC~~ SOLN
0.0000 [IU] | Freq: Every day | SUBCUTANEOUS | Status: DC
  Administered 2017-05-01: 2 [IU] via SUBCUTANEOUS

## 2017-04-28 MED ORDER — ASPIRIN EC 81 MG PO TBEC
81.0000 mg | DELAYED_RELEASE_TABLET | Freq: Every day | ORAL | Status: DC
  Administered 2017-04-29: 81 mg via ORAL
  Filled 2017-04-28: qty 1

## 2017-04-28 MED ORDER — DM-GUAIFENESIN ER 30-600 MG PO TB12
1.0000 | ORAL_TABLET | Freq: Two times a day (BID) | ORAL | Status: DC
  Administered 2017-04-28 – 2017-05-06 (×16): 1 via ORAL
  Filled 2017-04-28 (×16): qty 1

## 2017-04-28 MED ORDER — SODIUM CHLORIDE 0.9% FLUSH
3.0000 mL | Freq: Two times a day (BID) | INTRAVENOUS | Status: DC
  Administered 2017-04-28 – 2017-04-29 (×2): 3 mL via INTRAVENOUS

## 2017-04-28 MED ORDER — METOPROLOL TARTRATE 5 MG/5ML IV SOLN
2.5000 mg | Freq: Once | INTRAVENOUS | Status: AC
  Administered 2017-04-28: 2.5 mg via INTRAVENOUS
  Filled 2017-04-28: qty 5

## 2017-04-28 MED ORDER — DILTIAZEM HCL 100 MG IV SOLR
5.0000 mg/h | INTRAVENOUS | Status: DC
  Administered 2017-04-28: 12.5 mg/h via INTRAVENOUS
  Administered 2017-04-29: 5 mg/h via INTRAVENOUS
  Administered 2017-04-30: 15 mg/h via INTRAVENOUS
  Filled 2017-04-28 (×3): qty 100

## 2017-04-28 MED ORDER — LORATADINE 10 MG PO TABS
10.0000 mg | ORAL_TABLET | Freq: Every day | ORAL | Status: DC
  Administered 2017-04-29 – 2017-05-06 (×8): 10 mg via ORAL
  Filled 2017-04-28 (×8): qty 1

## 2017-04-28 MED ORDER — DILTIAZEM HCL 100 MG IV SOLR
5.0000 mg/h | Freq: Once | INTRAVENOUS | Status: AC
  Administered 2017-04-28: 10 mg/h via INTRAVENOUS
  Filled 2017-04-28: qty 100

## 2017-04-28 MED ORDER — SODIUM CHLORIDE 0.9% FLUSH: 3.0000 mL | INTRAVENOUS | Status: DC | PRN

## 2017-04-28 NOTE — ED Provider Notes (Signed)
[facility] DEPT Provider Note   CSN: [id number] Arrival date & time: 04/28/17  1622     History   Chief Complaint Chief Complaint  Patient presents with  . Atrial Fibrillation    HPI Gilbert Dominguez is a 71 y.o. male.  Patient brought in from home by EMS. Patient with complaint of some generalized weakness and worsening shortness of breath over 3 weeks. Patient denies chest pain. No syncope. Patient followed by cardiology here. Patient's primary care doctors Dr. Arelia Sneddon. He is brought in by [facility]. 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 Patient's had problems with congestive heart failure in past. Patient has swelling of his legs at all times. Patient states he's had fast heart rate in the past but has never been proven are seen by cardiology. He was to wear a monitor but could not sleep with it so he only wore it for day. Patient is followed by Dr. Debara Pickett from cardiology.  EMS gave him a diltiazem bolus of 32.5 mg and started him on a 5 mg drip. This was for presumed atrial fibrillation. Due to her heart rate around 140.      Past Medical History:  Diagnosis Date  . CHF exacerbation ([place]) 02/24/2017  . Chronic respiratory failure ([place])   . Hypertension   . 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 Rheumatoid arthritis(714.0)     Patient Active Problem List   Diagnosis Date Noted  . Tachycardia 04/08/2017  . Acute on chronic diastolic CHF (congestive heart failure) ([place])   . Pulmonary hypertension ([place])   . Right heart failure due to pulmonary hypertension ([place])   . CKD (chronic kidney disease) stage 3, GFR 30-59 ml/min 02/25/2017  . Malnutrition of moderate degree 02/25/2017  . CHF exacerbation ([place]) 02/24/2017  . Pulmonary hypertension due to interstitial lung disease ([place]) 01/03/2017  . Acute on chronic  respiratory failure ([place]) 12/08/2016  . Fluid overload 12/08/2016  . Elevated troponin 12/08/2016  . Hyperlipidemia 12/08/2016  . Anemia 12/08/2016  . Essential hypertension 11/19/2015  . Cough 09/05/2014  . Postinflammatory pulmonary fibrosis ([place]) 11/23/2013  . Dyspnea 11/23/2013  . Chronic respiratory failure with hypoxia ([place]) 11/23/2013    Past Surgical History:  Procedure Laterality Date  . ACNE CYST REMOVAL  1968  . [place] Medications    Prior to Admission medications   Medication Sig Start Date End Date Taking? Authorizing Provider  acetaminophen (TYLENOL) 325 MG tablet Take 325 mg by mouth every 6 (six) hours as needed for mild pain or headache.    Yes [provider]  aspirin 81 MG tablet Take 81 mg by mouth daily.   Yes [provider]  atorvastatin (LIPITOR) 20 MG tablet Take 20 mg by mouth daily.   Yes [provider]  Calcium Carbonate-Vit D-Min (CALCIUM 600+D PLUS MINERALS PO) Take 1 tablet by mouth 2 (two) times daily.   Yes [provider]  dextromethorphan-guaiFENesin (MUCINEX DM) 30-600 MG 12hr tablet Take 1 tablet by mouth 2 (two) times daily.    Yes [provider]  famotidine (PEPCID) 20 MG tablet Take 1 tablet (20 mg total) by mouth at bedtime. 01/27/17  Yes Tanda Rockers, MD  furosemide (LASIX) 40 MG tablet Take 40-80 mg by mouth See admin instructions. 80 mg in the morning and 40 mg at bedtime 04/14/17  Yes [provider]  glipiZIDE-metformin (METAGLIP) 2.5-500 MG tablet Take 1 tablet by mouth 2 (two) times daily before a meal. 03/04/17  Yes Domenic Polite, MD  loratadine (CLARITIN) 10 MG tablet Take 10 mg by mouth daily.   Yes [provider]  OXYGEN Inhale 4-8 L into the lungs See admin instructions. 4 liters CONTINUOUSLY and to titrate up to 8 liters CONTINUOUSLY (with exertion) "to keep sats above 90" ([facility])   Yes [provider]  pantoprazole (PROTONIX) 40  MG tablet TAKE 1 TABLET BY MOUTH EVERY DAY *TAKE 30-60 MINUTES BEFORE FIRST MEAL OF THE DAY* 06/29/16  Yes Tanda Rockers, MD  potassium chloride SA (K-DUR,KLOR-CON) 20 MEQ tablet Take 1 tablet (20 mEq total) by mouth daily. 12/12/16  Yes Bonnielee Haff, MD  predniSONE (DELTASONE) 10 MG tablet Take 2 tablets (20 mg total) by mouth daily with breakfast. Until FU with Pulmonary MD 03/04/17  Yes Domenic Polite, MD  terazosin (HYTRIN) 2 MG capsule Take 2 mg by mouth at bedtime.   Yes [provider]  furosemide (LASIX) 20 MG tablet Take 3 tablets (60 mg total) by mouth 2 (two) times daily. Patient not taking: Reported on 04/28/2017 04/06/17   Pixie Casino, MD    Family History Family History  Problem Relation Age of Onset  . Heart disease Father   . Emphysema Father   . Liver cancer Mother     Social History Social History  Substance Use Topics  . Smoking status: Former Smoker    Packs/day: 1.50    Years: 37.00    Types: Cigarettes    Quit date: 10/14/1995  . Smokeless tobacco: Never Used  . Alcohol use 1.8 oz/week    3 Cans of beer per week     Comment: occasionally     Allergies   Lovastatin   Review of Systems Review of Systems  Constitutional: Positive for fatigue. Negative for fever.  HENT: Negative for congestion.   Eyes: Negative for visual disturbance.  Respiratory: Positive for shortness of breath.   Cardiovascular: Positive for leg swelling. Negative for chest pain and palpitations.  Gastrointestinal: Negative for abdominal pain.  Genitourinary: Negative for dysuria.  Musculoskeletal: Negative for back pain.  Skin: Negative for rash.  Neurological: Positive for weakness. Negative for headaches.  Hematological: Does not bruise/bleed easily.  Psychiatric/Behavioral: Negative for confusion.     Physical Exam Updated Vital Signs BP 105/83   Pulse (!) 140   Temp 98.2 F (36.8 C) (Oral)   Resp 17   Ht 1.6 m (_0 )   Wt 56.7 kg (125 lb)   SpO2  99%   BMI 22.14 kg/m   Physical Exam  Constitutional: He is oriented to person, place, and time. He appears well-developed and well-nourished. No distress.  HENT:  Head: Normocephalic and atraumatic.  Eyes: EOM are normal. Pupils are equal, round, and reactive to light.  Neck: Neck supple.  Cardiovascular: Regular rhythm.   Tachycardic  Pulmonary/Chest: Effort normal and breath sounds normal. No respiratory distress.  Abdominal: Soft. Bowel sounds are normal. There is no tenderness.  Musculoskeletal: Normal range of motion. He exhibits edema.  Neurological: He is alert and oriented to person, place, and time.  Skin: Skin is warm. There is erythema.  Nursing note and vitals reviewed.    ED Treatments / Results  Labs (all labs ordered are listed, but only abnormal results are displayed) Labs Reviewed  COMPREHENSIVE METABOLIC PANEL - Abnormal; Notable for the following:  Result Value   Potassium 3.4 (*)    Glucose, Bld 116 (*)    BUN 40 (*)    Albumin 3.1 (*)    AST 73 (*)    Total Bilirubin 2.3 (*)    All other components within normal limits  CBC WITH DIFFERENTIAL/PLATELET - Abnormal; Notable for the following:    WBC 11.8 (*)    RDW 18.2 (*)    Neutro Abs 11.1 (*)    Lymphs Abs 0.3 (*)    All other components within normal limits  PROTIME-INR - Abnormal; Notable for the following:    Prothrombin Time 16.6 (*)    All other components within normal limits  BRAIN NATRIURETIC PEPTIDE - Abnormal; Notable for the following:    B Natriuretic Peptide 2,262.7 (*)    All other components within normal limits    EKG  EKG Interpretation  Date/Time:  Wednesday April 28 2017 18:23:27 EDT Ventricular Rate:  140 PR Interval:    QRS Duration: 109 QT Interval:  292 QTC Calculation: 446 R Axis:   164 Text Interpretation:  Ectopic atrial tachycardia, unifocal Right ventricular hypertrophy Borderline T abnormalities, diffuse leads Confirmed by Fredia Sorrow [phone]) on  04/28/2017 6:41:26 PM       Radiology Dg Chest Port 1 View  Result Date: 04/28/2017 CLINICAL DATA:  Generalized weakness and dyspnea. EXAM: PORTABLE CHEST 1 VIEW COMPARISON:  04/12/2017.  02/24/2017.  11/19/2015. FINDINGS: 1645 hours. Low lung volumes. Diffuse interstitial and patchy basilar airspace opacity is similar to prior and compatible with chronic underlying lung disease given the relative stability. The cardio pericardial silhouette is enlarged. The visualized bony structures of the thorax are intact. Telemetry leads overlie the chest. . IMPRESSION: 1. Stable appearance of chronic interstitial lung disease. 2. No new or progressive findings. Electronically Signed   By: Misty Stanley M.D.   On: 04/28/2017 16:57    Procedures Procedures (including critical care time)  CRITICAL CARE Performed by: Fredia Sorrow Total critical care time: 30 minutes Critical care time was exclusive of separately billable procedures and treating other patients. Critical care was necessary to treat or prevent imminent or life-threatening deterioration. Critical care was time spent personally by me on the following activities: development of treatment plan with patient and/or surrogate as well as nursing, discussions with consultants, evaluation of patient's response to treatment, examination of patient, obtaining history from patient or surrogate, ordering and performing treatments and interventions, ordering and review of laboratory studies, ordering and review of radiographic studies, pulse oximetry and re-evaluation of patient's condition.   Medications Ordered in ED Medications  0.9 %  sodium chloride infusion (1,000 mLs Intravenous New Bag/Given 04/28/17 1710)  diltiazem (CARDIZEM) 100 mg in dextrose 5 % 100 mL (1 mg/mL) infusion (12 mg/hr Intravenous Rate/Dose Change 04/28/17 1848)  adenosine (ADENOCARD) 6 MG/2ML injection 6 mg (6 mg Intravenous Given 04/28/17 1928)     Initial Impression /  Assessment and Plan / ED Course  I have reviewed the triage vital signs and the nursing notes.  Pertinent labs & imaging results that were available during my care of the patient were reviewed by me and considered in my medical decision making (see chart for details).    Patient arrived on diltiazem drip by EMS. They also given a 32.5 mg bolus in route. Patient's heart rate was in the 130s upon arrival. Occasionally would go up to the 140s. EKG was fairly regular and was much more suggestive of an atrial tachycardia. Orders prior to me seeing  him had the nurse titrate the diltiazem drip. Patient went up to 15 mg drip without any significant change. Slight decrease in blood pressure but was always above 90. Patient asymptomatic. Patient wears oxygen at all times.  Discussed the situation with the cardiology Dr. Bronson Ing, he agreed that seem to be consistent with an atrial tachycardia. Very regular. Recommended giving a trial of adenosine. Patient was given 6 mg of adenosine IV did get up on his. Other than went back into the atrial tachycardia. Plan was if he did not convert that he would be admitted by medicine cardiology will consult. Cardiology also stated that based on his past medical condition of the pulmonary hypertension interstitial pulmonary disease and CHF that this most likely would be an atrial tachycardia. Stated keep him on the diltiazem drip.  Patient hears been stable. Despite the fast heart rate. Patient tolerated the adenosine without any difficulties. Patient will be admitted by Dr. Maudie Mercury from hospitalist service.      Final Clinical Impressions(s) / ED Diagnoses   Final diagnoses:  Atrial tachycardia [facility])    New Prescriptions New Prescriptions   No medications on file     Fredia Sorrow, MD 04/28/17 [phone]

## 2017-04-28 NOTE — ED Triage Notes (Signed)
Pt arrives from home with EMS. Generalized weakness and worsened dyspnea x3 weeks. Denies CP or dizziness. When EMS arrived pt was hr 156 Afib RVR. PT received 600ML NS with 32.24m Cardizem bolus received and cardizem infusing 541mhr.    120/80 hr130 AFIB rr 20  SpO2 98% 4LPM  cbg 141  18RAC

## 2017-04-28 NOTE — H&P (Signed)
TRH H&P   Patient Demographics:    Gilbert Dominguez, is a 71 y.o. male  MRN: [id number]   DOB - Oct 24, 1945  Admit Date - 04/28/2017  Outpatient Primary MD for the patient is Leonard Downing, MD  Referring MD/NP/PA: Aundria Rud  Outpatient Specialists:   Mali Hilty, Simonne Maffucci  Patient coming from: home  Chief Complaint  Patient presents with  . Atrial Fibrillation      HPI:    Gilbert Dominguez  is a 71 y.o. male, w CHF (EF 55%), Pulmonary fibrosis, Pulmonary htn, Rheumatoid arthritis, Dm2  apparently was not feelnig well at home when EMS came to examine his wife who had fallen. Pt was noted to be tachycardic and thought to be in Afib and tx with cardizem.  Pt c/o generalized weakness and feeling like he was going to pass out.    In ED, pt was tx with adenosine per cardiology recommendations and slowed down briefly but then again became tachycardic.  Pt will be admitted for tachycardia.     Review of systems:    In addition to the HPI above,  No Fever-chills, No Headache, No changes with Vision or hearing, No problems swallowing food or Liquids, No Chest pain, Cough or Shortness of Breath, No Abdominal pain, No Nausea or Vommitting, Bowel movements are regular, No Blood in stool or Urine, No dysuria, No new skin rashes or bruises, No new joints pains-aches,  No new weakness, tingling, numbness in any extremity, No recent weight gain or loss, No polyuria, polydypsia or polyphagia, No significant Mental Stressors.  A full 10 point Review of Systems was done, except as stated above, all other Review of Systems were negative.   With Past History of the following :    Past Medical History:  Diagnosis Date  . CHF exacerbation ([place]) 02/24/2017  . Chronic respiratory failure ([place])   . Hypertension   . 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  Rheumatoid arthritis(714.0)       Past Surgical History:  Procedure Laterality Date  . ACNE CYST REMOVAL  1968  . VASECTOMY  1973      Social History:     Social History  Substance Use Topics  . Smoking status: Former Smoker    Packs/day: 1.50    Years: 37.00    Types: Cigarettes    Quit date: 10/14/1995  . Smokeless tobacco: Never Used  . Alcohol use 1.8 oz/week    3 Cans of beer per week     Comment: occasionally     Lives - at home  Mobility - walks by self.   Family History :     Family History  Problem Relation Age of Onset  . Heart disease Father   . Emphysema Father   . Liver cancer Mother       Home Medications:   Prior to Admission medications  Medication Sig Start Date End Date Taking? Authorizing Provider  acetaminophen (TYLENOL) 325 MG tablet Take 325 mg by mouth every 6 (six) hours as needed for mild pain or headache.    Yes [provider]  aspirin 81 MG tablet Take 81 mg by mouth daily.   Yes [provider]  atorvastatin (LIPITOR) 20 MG tablet Take 20 mg by mouth daily.   Yes [provider]  Calcium Carbonate-Vit D-Min (CALCIUM 600+D PLUS MINERALS PO) Take 1 tablet by mouth 2 (two) times daily.   Yes [provider]  dextromethorphan-guaiFENesin (MUCINEX DM) 30-600 MG 12hr tablet Take 1 tablet by mouth 2 (two) times daily.    Yes [provider]  famotidine (PEPCID) 20 MG tablet Take 1 tablet (20 mg total) by mouth at bedtime. 01/27/17  Yes Tanda Rockers, MD  furosemide (LASIX) 40 MG tablet Take 40-80 mg by mouth See admin instructions. 80 mg in the morning and 40 mg at bedtime 04/14/17  Yes [provider]  glipiZIDE-metformin (METAGLIP) 2.5-500 MG tablet Take 1 tablet by mouth 2 (two) times daily before a meal. 03/04/17  Yes Domenic Polite, MD  loratadine (CLARITIN) 10 MG tablet Take 10 mg by mouth daily.   Yes [provider]  OXYGEN Inhale 4-8 L into the lungs See admin  instructions. 4 liters CONTINUOUSLY and to titrate up to 8 liters CONTINUOUSLY (with exertion) "to keep sats above 90" ([facility])   Yes [provider]  pantoprazole (PROTONIX) 40 MG tablet TAKE 1 TABLET BY MOUTH EVERY DAY *TAKE 30-60 MINUTES BEFORE FIRST MEAL OF THE DAY* 06/29/16  Yes Tanda Rockers, MD  potassium chloride SA (K-DUR,KLOR-CON) 20 MEQ tablet Take 1 tablet (20 mEq total) by mouth daily. 12/12/16  Yes Bonnielee Haff, MD  predniSONE (DELTASONE) 10 MG tablet Take 2 tablets (20 mg total) by mouth daily with breakfast. Until FU with Pulmonary MD 03/04/17  Yes Domenic Polite, MD  terazosin (HYTRIN) 2 MG capsule Take 2 mg by mouth at bedtime.   Yes [provider]  furosemide (LASIX) 20 MG tablet Take 3 tablets (60 mg total) by mouth 2 (two) times daily. Patient not taking: Reported on 04/28/2017 04/06/17   Pixie Casino, MD     Allergies:     Allergies  Allergen Reactions  . Lovastatin Rash     Physical Exam:   Vitals  Blood pressure 105/83, pulse (!) 140, temperature 98.2 F (36.8 C), temperature source Oral, resp. rate 17, height _0  (1.6 m), weight 56.7 kg (125 lb), SpO2 99 %.   1. General  lying in bed in NAD,    2. Normal affect and insight, Not Suicidal or Homicidal, Awake Alert, Oriented X 3.  3. No F.N deficits, ALL C.Nerves Intact, Strength 5/5 all 4 extremities, Sensation intact all 4 extremities, Plantars down going.  4. Ears and Eyes appear Normal, Conjunctivae clear, PERRLA. Moist Oral Mucosa.  5. Supple Neck, No JVD, No cervical lymphadenopathy appriciated, No Carotid Bruits.  6. Symmetrical Chest wall movement, Good air movement bilaterally, CTAB.  7. Tachy s1, s2, , No Gallops, Rubs or Murmurs, No Parasternal Heave.  8. Positive Bowel Sounds, Abdomen Soft, No tenderness, No organomegaly appriciated,No rebound -guarding or rigidity.  9.  No Cyanosis, Normal Skin Turgor, No Skin Rash or Bruise.  10. Good muscle tone,  joints appear  normal , no effusions, Normal ROM.  11. No Palpable Lymph Nodes in Neck or Axillae     Data Review:  CBC  Recent Labs Lab 04/28/17 1725  WBC 11.8*  HGB 13.0  HCT 40.0  PLT 197  MCV 91.7  MCH 29.8  MCHC 32.5  RDW 18.2*  LYMPHSABS 0.3*  MONOABS 0.5  EOSABS 0.0  BASOSABS 0.0   ------------------------------------------------------------------------------------------------------------------  Chemistries   Recent Labs Lab 04/28/17 1725  NA 141  K 3.4*  CL 101  CO2 28  GLUCOSE 116*  BUN 40*  CREATININE 1.15  CALCIUM 9.0  AST 73*  ALT 39  ALKPHOS 88  BILITOT 2.3*   ------------------------------------------------------------------------------------------------------------------ estimated creatinine clearance is 46.6 mL/min (by C-G formula based on SCr of 1.15 mg/dL). ------------------------------------------------------------------------------------------------------------------ No results for input(s): TSH, T4TOTAL, T3FREE, THYROIDAB in the last 72 hours.  Invalid input(s): FREET3  Coagulation profile  Recent Labs Lab 04/28/17 1725  INR 1.33   ------------------------------------------------------------------------------------------------------------------- No results for input(s): DDIMER in the last 72 hours. -------------------------------------------------------------------------------------------------------------------  Cardiac Enzymes No results for input(s): CKMB, TROPONINI, MYOGLOBIN in the last 168 hours.  Invalid input(s): CK ------------------------------------------------------------------------------------------------------------------    Component Value Date/Time   BNP 2,262.7 (H) 04/28/2017 1725     ---------------------------------------------------------------------------------------------------------------  Urinalysis    Component Value Date/Time   COLORURINE COLORLESS (A) 12/08/2016 0604   APPEARANCEUR CLEAR 12/08/2016  0604   LABSPEC 1.006 12/08/2016 0604   PHURINE 6.0 12/08/2016 0604   GLUCOSEU NEGATIVE 12/08/2016 0604   HGBUR NEGATIVE 12/08/2016 0604   BILIRUBINUR NEGATIVE 12/08/2016 0604   KETONESUR NEGATIVE 12/08/2016 0604   PROTEINUR NEGATIVE 12/08/2016 0604   NITRITE NEGATIVE 12/08/2016 0604   LEUKOCYTESUR NEGATIVE 12/08/2016 0604    ----------------------------------------------------------------------------------------------------------------   Imaging Results:    Dg Chest Port 1 View  Result Date: 04/28/2017 CLINICAL DATA:  Generalized weakness and dyspnea. EXAM: PORTABLE CHEST 1 VIEW COMPARISON:  04/12/2017.  02/24/2017.  11/19/2015. FINDINGS: 1645 hours. Low lung volumes. Diffuse interstitial and patchy basilar airspace opacity is similar to prior and compatible with chronic underlying lung disease given the relative stability. The cardio pericardial silhouette is enlarged. The visualized bony structures of the thorax are intact. Telemetry leads overlie the chest. . IMPRESSION: 1. Stable appearance of chronic interstitial lung disease. 2. No new or progressive findings. Electronically Signed   By: Misty Stanley M.D.   On: 04/28/2017 16:57   Tachycardic ST at 140, nl axis, nl int, no st-t changes   Assessment & Plan:    Principal Problem:   Tachycardia Active Problems:   CHF exacerbation (HCC)   Hypokalemia   Hyperglycemia    Tachycardia Tele Trop I q6h x3 Tsh Cardiac echo Continue cardizem gtt   Hypokalemia Replete  Check cmp in am  Hyperglycemia/ Dm2 fsbs ac and qhs, iss Check hga1c    CHF  (EF 55%) Continue lasix   DVT Prophylaxis Lovenox - SCDs   AM Labs Ordered, also please review Full Orders  Family Communication: Admission, patients condition and plan of care including tests being ordered have been discussed with the patient  who indicate understanding and agree with the plan and Code Status.  Code Status FULL CODE  Likely DC to  home  Condition  GUARDED    Consults called: cardiology by ED  Admission status: inpatient  Time spent in minutes : 45   Jani Gravel M.D on 04/28/2017 at 8:00 PM  Between 7am to 7pm - Pager - [phone]. After 7pm go to [email or web address] - password [facility]  [facility] - Office  [phone]

## 2017-04-29 ENCOUNTER — Inpatient Hospital Stay (HOSPITAL_COMMUNITY): Payer: Medicare Other

## 2017-04-29 DIAGNOSIS — I5033 Acute on chronic diastolic (congestive) heart failure: Secondary | ICD-10-CM

## 2017-04-29 DIAGNOSIS — R739 Hyperglycemia, unspecified: Secondary | ICD-10-CM

## 2017-04-29 DIAGNOSIS — I272 Pulmonary hypertension, unspecified: Secondary | ICD-10-CM

## 2017-04-29 DIAGNOSIS — I4892 Unspecified atrial flutter: Secondary | ICD-10-CM

## 2017-04-29 DIAGNOSIS — M7989 Other specified soft tissue disorders: Secondary | ICD-10-CM

## 2017-04-29 LAB — CBC
HEMATOCRIT: 43.3 % (ref 39.0–52.0)
Hemoglobin: 14 g/dL (ref 13.0–17.0)
MCH: 30.1 pg (ref 26.0–34.0)
MCHC: 32.3 g/dL (ref 30.0–36.0)
MCV: 93.1 fL (ref 78.0–100.0)
PLATELETS: 220 10*3/uL (ref 150–400)
RBC: 4.65 MIL/uL (ref 4.22–5.81)
RDW: 18.4 % — ABNORMAL HIGH (ref 11.5–15.5)
WBC: 10 10*3/uL (ref 4.0–10.5)

## 2017-04-29 LAB — PHOSPHORUS: Phosphorus: 3.5 mg/dL (ref 2.5–4.6)

## 2017-04-29 LAB — TROPONIN I
Troponin I: 0.06 ng/mL (ref <0.03)
Troponin I: 0.07 ng/mL (ref <0.03)
Troponin I: 0.05 ng/mL (ref <0.03)

## 2017-04-29 LAB — GLUCOSE, CAPILLARY
GLUCOSE-CAPILLARY: 108 mg/dL — AB (ref 65–99)
GLUCOSE-CAPILLARY: 127 mg/dL — AB (ref 65–99)
GLUCOSE-CAPILLARY: 108 mg/dL — AB (ref 65–99)
GLUCOSE-CAPILLARY: 143 mg/dL — AB (ref 65–99)

## 2017-04-29 LAB — COMPREHENSIVE METABOLIC PANEL
ALBUMIN: 2.8 g/dL — AB (ref 3.5–5.0)
ALK PHOS: 86 U/L (ref 38–126)
ALT: 38 U/L (ref 17–63)
ANION GAP: 8 (ref 5–15)
AST: 71 U/L — AB (ref 15–41)
BILIRUBIN TOTAL: 1.7 mg/dL — AB (ref 0.3–1.2)
BUN: 42 mg/dL — AB (ref 6–20)
CALCIUM: 8.3 mg/dL — AB (ref 8.9–10.3)
CO2: 27 mmol/L (ref 22–32)
Chloride: 100 mmol/L — ABNORMAL LOW (ref 101–111)
Creatinine, Ser: 1.2 mg/dL (ref 0.61–1.24)
GFR calc Af Amer: >60 mL/min (ref >60)
GFR calc non Af Amer: 59 mL/min — ABNORMAL LOW (ref >60)
GLUCOSE: 165 mg/dL — AB (ref 65–99)
POTASSIUM: 3.9 mmol/L (ref 3.5–5.1)
SODIUM: 135 mmol/L (ref 135–145)
TOTAL PROTEIN: 5.6 g/dL — AB (ref 6.5–8.1)

## 2017-04-29 LAB — MRSA PCR SCREENING: MRSA BY PCR: NEGATIVE

## 2017-04-29 LAB — MAGNESIUM: Magnesium: 1.9 mg/dL (ref 1.7–2.4)

## 2017-04-29 LAB — TSH: TSH: 1.824 u[IU]/mL (ref 0.350–4.500)

## 2017-04-29 MED ORDER — APIXABAN 5 MG PO TABS
5.0000 mg | ORAL_TABLET | Freq: Two times a day (BID) | ORAL | Status: DC
  Administered 2017-04-29 – 2017-05-06 (×14): 5 mg via ORAL
  Filled 2017-04-29 (×14): qty 1

## 2017-04-29 MED ORDER — FUROSEMIDE 10 MG/ML IJ SOLN
40.0000 mg | Freq: Two times a day (BID) | INTRAMUSCULAR | Status: DC
  Administered 2017-04-29 – 2017-04-30 (×2): 40 mg via INTRAVENOUS
  Filled 2017-04-29 (×2): qty 4

## 2017-04-29 MED ORDER — GLIPIZIDE 5 MG PO TABS
2.5000 mg | ORAL_TABLET | Freq: Two times a day (BID) | ORAL | Status: DC
  Administered 2017-04-30 – 2017-05-04 (×8): 2.5 mg via ORAL
  Filled 2017-04-29 (×8): qty 1

## 2017-04-29 MED ORDER — DIGOXIN 125 MCG PO TABS: 0.1250 mg | ORAL_TABLET | Freq: Every day | ORAL | Status: DC

## 2017-04-29 MED ORDER — METFORMIN HCL 500 MG PO TABS
500.0000 mg | ORAL_TABLET | Freq: Two times a day (BID) | ORAL | Status: DC
  Administered 2017-05-02 – 2017-05-04 (×4): 500 mg via ORAL
  Filled 2017-04-29 (×4): qty 1

## 2017-04-29 MED ORDER — DIGOXIN 0.1 MG/ML IJ SOLN
0.2500 mg | Freq: Four times a day (QID) | INTRAMUSCULAR | Status: DC
  Filled 2017-04-29 (×3): qty 3

## 2017-04-29 MED ORDER — DIGOXIN 0.25 MG/ML IJ SOLN
0.2500 mg | Freq: Four times a day (QID) | INTRAMUSCULAR | Status: DC
  Administered 2017-04-30 (×2): 0.25 mg via INTRAVENOUS
  Filled 2017-04-29 (×2): qty 2

## 2017-04-29 MED ORDER — IOPAMIDOL (ISOVUE-370) INJECTION 76%
INTRAVENOUS | Status: AC
  Filled 2017-04-29: qty 100

## 2017-04-29 NOTE — Consult Note (Signed)
Cardiology Consult    Patient ID: Gilbert Dominguez MRN: [id number], DOB/AGE: 18-Sep-1946   Admit date: 04/28/2017 Date of Consult: 04/29/2017  Primary Physician: Leonard Downing, MD Primary Cardiologist: Dr. Debara Dominguez Requesting Provider: Dr. Eliseo Squires  Reason for Consult: tachycardia  Patient Profile    Gilbert Dominguez has a PMH significant for chronic respiratory failure on hom eO2 8-10L, pulmonary fibrosis, pulmonary hypertension, with possible agent orange exposure as a [place] veteran, RA, former smoker, and CKD stage III.  While evaluating hs wife for a fall at home, EMS noted the pt was weak and tachycardic; he stated he felt like he would pass out. He was brought to [facility] for evaluation of tachycardia.  Gilbert Dominguez is a 71 y.o. male who is being seen today for the evaluation of tachycardia at the request of Dr. Eliseo Squires.  Past Medical History   Past Medical History:  Diagnosis Date  . CHF exacerbation ([place]) 02/24/2017  . Chronic respiratory failure ([place])   . Hypertension   . 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 Rheumatoid arthritis(714.0)     Past Surgical History:  Procedure Laterality Date  . ACNE CYST REMOVAL  1968  . VASECTOMY  1973     Allergies  Allergies  Allergen Reactions  . Lovastatin Rash    History of Present Illness    Gilbert Dominguez is known to this service and last saw Dr. Debara Dominguez in clinic on 04/06/17. At that time, he was following up on a recent hospitalization (02/2017) for respiratory failure secondary to chronic diastolic heart failure and metapneumovirus. Recent echo in February 2018 demonstrates predominantly right heart failure with severely dilated right ventricle, moderate systolic RV dysfunction, severe pulmonary hypertension and mild aortic valve stenosis. His dry weight is near 123 lbs. At this clinic visit he was found to be volume up on exam and in weight. Dr. Debara Dominguez increased his Lasix to 60 mg BID. He also noted some intermittent  tachycardia. An event monitor was ordered to rule out Afib.  Holter monitor was placed on 04/20/17. EKG on 02/24/17 with sinus rhythm.  EMS was called to his home to examine Gilbert Dominguez wife, who had fallen. The patient was noted to be tachycardic and weak. Pt stated he felt like he might pass out.  EMS brought him to the [facility] for evaluation of weakness and possible Afib.  In the ED, the patient was given adenosine (per EDP note, this was per cardiology); this decreased his rate transiently, but he returned to tachycardia. He was started on cardizem drip, currently 5 mg/hr.  On my interview, he describes an episode of bending down and standing up rapidly with an episode of dizziness in front of EMS. EMS ran a strip which showed tachycardia. He denies chest pain and palpitations. He is unaware of when he is tachycardic. He notes episodes of high heart rate per his home pulseox. He states the swelling on his legs is near his baseline, despite his recent increase in lasix.   Review of telemetry and EKG with atrial flutter.   Inpatient Medications    . aspirin EC  81 mg Oral Daily  . atorvastatin  20 mg Oral Daily  . dextromethorphan-guaiFENesin  1 tablet Oral BID  . enoxaparin (LOVENOX) injection  40 mg Subcutaneous Daily  . famotidine  20 mg Oral QHS  . feeding supplement (PRO-STAT SUGAR FREE 64)  30 mL Oral BID  . furosemide  20 mg Intravenous Daily  . furosemide  40 mg Intravenous BID  .  glipiZIDE  2.5 mg Oral BID WC   And  . metFORMIN  500 mg Oral BID WC  . insulin aspart  0-5 Units Subcutaneous QHS  . insulin aspart  0-9 Units Subcutaneous TID WC  . loratadine  10 mg Oral Daily  . pantoprazole  40 mg Oral Daily  . potassium chloride SA  20 mEq Oral Daily  . predniSONE  20 mg Oral Q breakfast  . sodium chloride flush  3 mL Intravenous Q12H  . sodium chloride flush  3 mL Intravenous Q12H     Outpatient Medications    Prior to Admission medications   Medication Sig Start Date End  Date Taking? Authorizing Provider  acetaminophen (TYLENOL) 325 MG tablet Take 325 mg by mouth every 6 (six) hours as needed for mild pain or headache.    Yes [provider]  aspirin 81 MG tablet Take 81 mg by mouth daily.   Yes [provider]  atorvastatin (LIPITOR) 20 MG tablet Take 20 mg by mouth daily.   Yes [provider]  Calcium Carbonate-Vit D-Min (CALCIUM 600+D PLUS MINERALS PO) Take 1 tablet by mouth 2 (two) times daily.   Yes [provider]  dextromethorphan-guaiFENesin (MUCINEX DM) 30-600 MG 12hr tablet Take 1 tablet by mouth 2 (two) times daily.    Yes [provider]  famotidine (PEPCID) 20 MG tablet Take 1 tablet (20 mg total) by mouth at bedtime. 01/27/17  Yes Tanda Rockers, MD  furosemide (LASIX) 40 MG tablet Take 40-80 mg by mouth See admin instructions. 80 mg in the morning and 40 mg at bedtime 04/14/17  Yes [provider]  glipiZIDE-metformin (METAGLIP) 2.5-500 MG tablet Take 1 tablet by mouth 2 (two) times daily before a meal. 03/04/17  Yes Domenic Polite, MD  loratadine (CLARITIN) 10 MG tablet Take 10 mg by mouth daily.   Yes [provider]  OXYGEN Inhale 4-8 L into the lungs See admin instructions. 4 liters CONTINUOUSLY and to titrate up to 8 liters CONTINUOUSLY (with exertion) "to keep sats above 90" ([employer])   Yes [provider]  pantoprazole (PROTONIX) 40 MG tablet TAKE 1 TABLET BY MOUTH EVERY DAY *TAKE 30-60 MINUTES BEFORE FIRST MEAL OF THE DAY* 06/29/16  Yes Tanda Rockers, MD  potassium chloride SA (K-DUR,KLOR-CON) 20 MEQ tablet Take 1 tablet (20 mEq total) by mouth daily. 12/12/16  Yes Bonnielee Haff, MD  predniSONE (DELTASONE) 10 MG tablet Take 2 tablets (20 mg total) by mouth daily with breakfast. Until FU with Pulmonary MD 03/04/17  Yes Domenic Polite, MD  terazosin (HYTRIN) 2 MG capsule Take 2 mg by mouth at bedtime.   Yes [provider]  furosemide (LASIX) 20 MG tablet Take 3  tablets (60 mg total) by mouth 2 (two) times daily. Patient not taking: Reported on 04/28/2017 04/06/17   Pixie Casino, MD     Family History    Family History  Problem Relation Age of Onset  . Heart disease Father   . Emphysema Father   . Liver cancer Mother     Social History    Social History   Social History  . Marital status: Single    Spouse name: N/A  . Number of children: 2  . Years of education: N/A   Occupational History  . Retired Optometrist    Social History Main Topics  . Smoking status: Former Smoker    Packs/day: 1.50    Years: 37.00    Types: Cigarettes  Quit date: 10/14/1995  . Smokeless tobacco: Never Used  . Alcohol use 1.8 oz/week    3 Cans of beer per week     Comment: occasionally  . Drug use: No  . Sexual activity: Not on file   Other Topics Concern  . Not on file   Social History Narrative  . No narrative on file     Review of Systems    General:  No chills, fever, night sweats or weight changes.  Cardiovascular:  No chest pain, dyspnea on exertion, edema, orthopnea, palpitations, paroxysmal nocturnal dyspnea. Dermatological: No rash, lesions/masses Respiratory: No cough, dyspnea Urologic: No hematuria, dysuria Abdominal:   No nausea, vomiting, diarrhea, bright red blood per rectum, melena, or hematemesis Neurologic:  No visual changes, wkns, changes in mental status. All other systems reviewed and are otherwise negative except as noted above.  Physical Exam    Blood pressure 103/85, pulse (!) 113, temperature 98 F (36.7 C), temperature source Oral, resp. rate 18, height _0  (1.6 m), weight 128 lb 4.8 oz (58.2 kg), SpO2 97 %.  General: Pleasant, NAD Psych: Normal affect. Neuro: Alert and oriented X 3. Moves all extremities spontaneously. HEENT: Normal  Neck: Supple without bruits, mild JVD. Lungs:  Resp regular and unlabored, coarse sounds throughout secondary to fibrosis Heart: Irregular rhythm and rate, no  murmurs. Abdomen: Soft, non-tender, non-distended, BS + x 4.  Extremities: No clubbing, cyanosis, 1+ edema. DP/PT/Radials 2+ and equal bilaterally.  Labs    Troponin (Point of Care Test) No results for input(s): TROPIPOC in the last 72 hours.  Recent Labs  04/28/17 2303 04/29/17 0443 04/29/17 1018  TROPONINI 0.06* 0.07* 0.05*   Lab Results  Component Value Date   WBC 10.0 04/29/2017   HGB 14.0 04/29/2017   HCT 43.3 04/29/2017   MCV 93.1 04/29/2017   PLT 220 04/29/2017     Recent Labs Lab 04/29/17 0443  NA 135  K 3.9  CL 100*  CO2 27  BUN 42*  CREATININE 1.20  CALCIUM 8.3*  PROT 5.6*  BILITOT 1.7*  ALKPHOS 86  ALT 38  AST 71*  GLUCOSE 165*   No results found for: CHOL, HDL, LDLCALC, TRIG Lab Results  Component Value Date   DDIMER 0.90 (H) 04/28/2017     Radiology Studies    Dg Chest 2 View  Result Date: 04/12/2017 CLINICAL DATA:  Follow-up pulmonary hypertension and previous infiltrative changes EXAM: CHEST  2 VIEW COMPARISON:  03/02/2017 FINDINGS: Cardiac shadow is again mildly enlarged. Lungs are well aerated bilaterally. Stable patchy scarring is noted throughout both lungs worse on the left than the right. No sizable effusion is seen. No bony abnormality is noted. IMPRESSION: Stable chronic changes in the bases bilaterally. No acute abnormality noted. Electronically Signed   By: Inez Catalina M.D.   On: 04/12/2017 09:22   Ct Angio Chest Pe W Or Wo Contrast  Result Date: 04/29/2017 CLINICAL DATA:  Shortness of breath with elevated D-dimer EXAM: CT ANGIOGRAPHY CHEST WITH CONTRAST TECHNIQUE: Multidetector CT imaging of the chest was performed using the standard protocol during bolus administration of intravenous contrast. Multiplanar CT image reconstructions and MIPs were obtained to evaluate the vascular anatomy. CONTRAST:  100 mL Isovue 370 intravenous COMPARISON:  04/28/2017, 02/25/2017, 12/08/2016 FINDINGS: Cardiovascular: Suboptimal opacification of the  distal branch vessels, nondiagnostic for assessment of embolus particularly within the subsegmental lower lobe branch vessels. No central filling defect are visualized. Enlarged pulmonary artery trunk up to 3.4 cm suggestive of hypertension.  Aortic atherosclerosis. Coronary artery calcification. Cardiomegaly. No significant effusion. Reflux of contrast into the hepatic veins consistent with elevated right heart pressure. Mediastinum/Nodes: Midline trachea. No thyroid mass. Nonspecific subcentimeter mediastinal lymph nodes. Esophagus unremarkable. Lungs/Pleura: Moderate emphysema. Mild bronchiectasis in the lower lobes with bilateral subpleural fibrosis. Partial consolidation in the right upper lobe. No pleural effusion. Upper Abdomen: No acute abnormality. Musculoskeletal: Degenerative changes. No acute or suspicious bone lesion. Review of the MIP images confirms the above findings. IMPRESSION: 1. Suboptimal study due to poor contrast opacification of distal branch vessels, nondiagnostic for evaluation of subsegmental embolus in the bilateral lower lobes. There is no evidence for an acute central pulmonary embolus. 2. Cardiomegaly with enlarged main pulmonary vessels suggesting pulmonary artery hypertension. Reflux of contrast into the hepatic veins suggests elevated right heart pressures. 3. Moderate emphysema with scattered areas of subpleural fibrosis and mild bronchiectasis in the lower lobes. Probable focal atelectasis in the right upper lobe. Aortic Atherosclerosis (ICD10-I70.0) and Emphysema (ICD10-J43.9). Electronically Signed   By: Donavan Foil M.D.   On: 04/29/2017 02:50   Nm Pulmonary Per & Vent  Result Date: 04/12/2017 CLINICAL DATA:  Pulmonary hypertension.  Rheumatoid arthritis. EXAM: NUCLEAR MEDICINE VENTILATION - PERFUSION LUNG SCAN TECHNIQUE: Ventilation images were obtained in multiple projections using inhaled aerosol Tc-78mDTPA. Perfusion images were obtained in multiple projections after  intravenous injection of Tc-961mAA. RADIOPHARMACEUTICALS:  30.0 mCi Technetium-9940mPA aerosol inhalation and 4.0 mCi Technetium-41m7m IV COMPARISON:  Radiographs 04/12/2017.  CT 02/25/2017. FINDINGS: Ventilation: Patchy decreased ventilation to both lungs, especially on the left. There is mild central clumping of the aerosol. Perfusion: Patchy perfusion to both lungs is similar to the ventilatory examination. Overall perfusion appears more uniform than ventilation. No wedge shaped peripheral perfusion defects to suggest acute pulmonary embolism. IMPRESSION: Low probability for acute pulmonary embolism. Pattern does not suggest chronic thromboembolic disease either. Electronically Signed   By: WillRichardean Sale.   On: 04/12/2017 11:01   Dg Chest Port 1 View  Result Date: 04/28/2017 CLINICAL DATA:  Generalized weakness and dyspnea. EXAM: PORTABLE CHEST 1 VIEW COMPARISON:  04/12/2017.  02/24/2017.  11/19/2015. FINDINGS: 1645 hours. Low lung volumes. Diffuse interstitial and patchy basilar airspace opacity is similar to prior and compatible with chronic underlying lung disease given the relative stability. The cardio pericardial silhouette is enlarged. The visualized bony structures of the thorax are intact. Telemetry leads overlie the chest. . IMPRESSION: 1. Stable appearance of chronic interstitial lung disease. 2. No new or progressive findings. Electronically Signed   By: EricMisty Stanley.   On: 04/28/2017 16:57    ECG & Cardiac Imaging    EKG 04/29/17: atrial tachycardia vs 2:1 flutter  Echocardiogram 12/08/16: Study Conclusions - Left ventricle: The cavity size was normal. Systolic function was   normal. The estimated ejection fraction was in the range of 55%   to 60%. Wall motion was normal; there were no regional wall   motion abnormalities. Doppler parameters are consistent with   abnormal left ventricular relaxation (grade 1 diastolic   dysfunction). - Ventricular septum: D-shaped  interventricular septum suggestive   of RV pressure/volume overload. - Aortic valve: Trileaflet; moderately calcified leaflets. There   was mild stenosis. Mean gradient (S): 10 mm Hg. Valve area (VTI):   1.52 cm^2. - Mitral valve: There was no significant regurgitation. - Right ventricle: The cavity size was severely dilated. Systolic   function was moderately reduced. - Right atrium: The atrium was moderately dilated. - Tricuspid valve: Peak  RV-RA gradient (S): 61 mm Hg. - Systemic veins: IVC not visualized. - Pericardium, extracardiac: A trivial pericardial effusion was   identified.  Impressions: - Normal LV size with EF 55-60%. D-shaped interventricular septum   suggestive of RV pressure/volume overload. 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 Severe pulmonary   hypertension. Mild aortic stenosis.  Assessment & Plan    1. Atrial flutter - per IM note, was given adenosine in ED which slowed him down but he returned to a tachycardic rate - telemetry review with bouts of tachycardia in the 120s - reviewed with Dr. Radford Pax and this is likely atrial flutter - increase cardizem drip to 10 mg/hr - start full dose eliquis - pharmacy consult - if unable to rate control with cardizem limited by hypotesnion, will consider digoxin; not a candidate for amiodarone 2/ pulmonology issues   2. Elevated troponin - 0.06 --> 0.07 --> 0.05 - this is likely related to demand ischemia in the presence of tachycardia - pt denies chest pain - will not pursue ischemic evaluation at this time.   3. Chronic diastolic heart failure - started 40 mg IV lasix BID - BMP daily - replace K daily since sCr is 1.20   4. CKD stage III - sCr 1.20, baseline appears 1.1-1.2   Signed, Ledora Bottcher, PA-C 04/29/2017, 4:31 PM [phone]

## 2017-04-29 NOTE — Plan of Care (Signed)
Problem: Safety: Goal: Ability to remain free from injury will improve Outcome: Progressing Pt states he will call for assistance to get out of bed.  Problem: Pain Managment: Goal: General experience of comfort will improve Outcome: Progressing Pt denies c/o pain or discomfort.  Problem: Physical Regulation: Goal: Ability to maintain clinical measurements within normal limits will improve Outcome: Progressing HR improving with IV cardizem and digoxin, other VSS.  Problem: Fluid Volume: Goal: Ability to maintain a balanced intake and output will improve Outcome: Progressing Pt diuresing well with IV lasix.

## 2017-04-29 NOTE — Progress Notes (Signed)
ANTICOAGULATION CONSULT NOTE - Initial Consult  Pharmacy Consult for apixaban Indication: atrial fibrillation  Allergies  Allergen Reactions  . Lovastatin Rash    Patient Measurements: Height: _0  (160 cm) Weight: 128 lb 4.8 oz (58.2 kg) IBW/kg (Calculated) : 56.9  Vital Signs: Temp: 98 F (36.7 C) (07/12 1125) Temp Source: Oral (07/12 1125) BP: 103/85 (07/12 1125) Pulse Rate: 113 (07/12 1125)  Labs:  Recent Labs  04/28/17 1725 04/28/17 2303 04/29/17 0443 04/29/17 1018  HGB 13.0  --  14.0  --   HCT 40.0  --  43.3  --   PLT 197  --  220  --   LABPROT 16.6*  --   --   --   INR 1.33  --   --   --   CREATININE 1.15  --  1.20  --   TROPONINI  --  0.06* 0.07* 0.05*    Estimated Creatinine Clearance: 44.8 mL/min (by C-G formula based on SCr of 1.2 mg/dL).   Medical History: Past Medical History:  Diagnosis Date  . CHF exacerbation ([place]) 02/24/2017  . Chronic respiratory failure ([place])   . Hypertension   . 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 Rheumatoid arthritis(714.0)     Assessment: 71 y.o. male, w CHF (EF 55%), Pulmonary fibrosis, Pulmonary htn, Rheumatoid arthritis, Dm2  apparently was not feelnig well at home when EMS came to examine his wife who had fallen.  New atrial fibrillation, remains on diltiazem drip. Was on lovenox for dvt px this am, will switch to apixaban this afternoon.   CBC within normal limits, will consult case management for copay information.   Goal of Therapy:  Monitor platelets by anticoagulation protocol: Yes   Plan:  Apixaban 62m bid  FErin HearingPharmD., BCPS Clinical Pharmacist Pager [phone]/09/2017 4:39 PM

## 2017-04-29 NOTE — Progress Notes (Signed)
HR still not controlled on higher dose of IV cardizem. In consultation with Dr. Radford Pax, started IV digoxin load tonight. Continue cardizem as pressure tolerates.   Ledora Bottcher, PA-C 04/29/2017, 5:45 PM [place], [place] [address]

## 2017-04-29 NOTE — Progress Notes (Signed)
PROGRESS NOTE    Gilbert Dominguez  XYV:[id number] DOB: 1945-10-24 DOA: 04/28/2017 PCP: Leonard Downing, MD   Outpatient Specialists:     Brief Narrative:  Gilbert Dominguez  is a 71 y.o. male, w CHF (EF 55%), Pulmonary fibrosis, Pulmonary htn, Rheumatoid arthritis, Dm2  apparently was not feelnig well at home when EMS came to examine his wife who had fallen. Pt was noted to be tachycardic and thought to be in Afib and tx with cardizem.  Pt c/o generalized weakness and feeling like he was going to pass out.   In ED, pt was tx with adenosine per cardiology recommendations and slowed down briefly but then again became tachycardic.  Pt will be admitted for tachycardia.     Assessment & Plan:   Principal Problem:   Tachycardia Active Problems:   CHF exacerbation (HCC)   Hypokalemia   Hyperglycemia   Atrial tachycardia (HCC)   Tachycardia- atrial- Tele Trop I q6h x3- trending down Tsh Cardiac echo Continue cardizem gtt -cardiology consult -CTA done- not ideal but shows emphysema but no PE  Chronic respiratory failure -wears 4L at rest and then 8L when exercising  Hypokalemia Repleted  Hyperglycemia/Dm2 fsbs ac and qhs, iss hga1c  pending  CHF  (EF 55%) Continue lasix  Left leg swelling/redness -check duplex  DVT prophylaxis:  Lovenox   Code Status: Full Code   Family Communication: none  Disposition Plan:     Consultants:  Cards  Procedures:      Subjective: Does not feel when his heart goes fast  Objective: Vitals:   04/29/17 0321 04/29/17 0322 04/29/17 0416 04/29/17 1125  BP:  101/73 101/72 103/85  Pulse: 89  (!) 45 (!) 113  Resp: (!) _0 Temp:   (!) 97.4 F (36.3 C) 98 F (36.7 C)  TempSrc:   Axillary Oral  SpO2: (!) 84%  99% 97%  Weight:   58.2 kg (128 lb 4.8 oz)   Height:        Intake/Output Summary (Last 24 hours) at 04/29/17 1432 Last data filed at 04/29/17 0741  Gross per 24 hour  Intake            595.54 ml  Output              600 ml  Net            -4.46 ml   Filed Weights   04/28/17 1633 04/28/17 2150 04/29/17 0416  Weight: 56.7 kg (125 lb) 56.5 kg (124 lb 8 oz) 58.2 kg (128 lb 4.8 oz)    Examination:  General exam: Appears calm and comfortable - on 4LNC Respiratory system: diminished, no wheezing Cardiovascular system: S1 & S2 heard, RRR. No JVD, murmurs, rubs, gallops or clicks. Left leg with more erythema and more swollen. Gastrointestinal system: Abdomen is nondistended, soft and nontender. No organomegaly or masses felt. Normal bowel sounds heard. Central nervous system: Alert and oriented. No focal neurological deficits. Cooperative, insight appropriate, good eye contact    Data Reviewed: I have personally reviewed following labs and imaging studies  CBC:  Recent Labs Lab 04/28/17 1725 04/29/17 0443  WBC 11.8* 10.0  NEUTROABS 11.1*  --   HGB 13.0 14.0  HCT 40.0 43.3  MCV 91.7 93.1  PLT 197 286   Basic Metabolic Panel:  Recent Labs Lab 04/28/17 1725 04/29/17 0443  NA 141 135  K 3.4* 3.9  CL 101 100*  CO2 28 27  GLUCOSE 116* 165*  BUN 40* 42*  CREATININE 1.15 1.20  CALCIUM 9.0 8.3*  MG  --  1.9  PHOS  --  3.5   GFR: Estimated Creatinine Clearance: 44.8 mL/min (by C-G formula based on SCr of 1.2 mg/dL). Liver Function Tests:  Recent Labs Lab 04/28/17 1725 04/29/17 0443  AST 73* 71*  ALT 39 38  ALKPHOS 88 86  BILITOT 2.3* 1.7*  PROT 6.6 5.6*  ALBUMIN 3.1* 2.8*   No results for input(s): LIPASE, AMYLASE in the last 168 hours. No results for input(s): AMMONIA in the last 168 hours. Coagulation Profile:  Recent Labs Lab 04/28/17 1725  INR 1.33   Cardiac Enzymes:  Recent Labs Lab 04/28/17 2303 04/29/17 0443 04/29/17 1018  TROPONINI 0.06* 0.07* 0.05*   BNP (last 3 results)  Recent Labs  01/20/17 1642  PROBNP 1,867.0*   HbA1C: No results for input(s): HGBA1C in the last 72 hours. CBG:  Recent Labs Lab  04/28/17 2300 04/29/17 0739 04/29/17 1125  GLUCAP 101* 108* 127*   Lipid Profile: No results for input(s): CHOL, HDL, LDLCALC, TRIG, CHOLHDL, LDLDIRECT in the last 72 hours. Thyroid Function Tests:  Recent Labs  04/28/17 2303  TSH 1.824   Anemia Panel: No results for input(s): VITAMINB12, FOLATE, FERRITIN, TIBC, IRON, RETICCTPCT in the last 72 hours. Urine analysis:    Component Value Date/Time   COLORURINE COLORLESS (A) 12/08/2016 0604   APPEARANCEUR CLEAR 12/08/2016 0604   LABSPEC 1.006 12/08/2016 0604   PHURINE 6.0 12/08/2016 0604   GLUCOSEU NEGATIVE 12/08/2016 0604   HGBUR NEGATIVE 12/08/2016 0604   BILIRUBINUR NEGATIVE 12/08/2016 0604   KETONESUR NEGATIVE 12/08/2016 0604   PROTEINUR NEGATIVE 12/08/2016 0604   NITRITE NEGATIVE 12/08/2016 0604   LEUKOCYTESUR NEGATIVE 12/08/2016 0604     ) Recent Results (from the past 240 hour(s))  MRSA PCR Screening     Status: None   Collection Time: 04/28/17 11:02 PM  Result Value Ref Range Status   MRSA by PCR NEGATIVE NEGATIVE Final    Comment:        The GeneXpert MRSA Assay (FDA approved for NASAL specimens only), is one component of a comprehensive MRSA colonization surveillance program. It is not intended to diagnose MRSA infection nor to guide or monitor treatment for MRSA infections.       Anti-infectives    None       Radiology Studies: Ct Angio Chest Pe W Or Wo Contrast  Result Date: 04/29/2017 CLINICAL DATA:  Shortness of breath with elevated D-dimer EXAM: CT ANGIOGRAPHY CHEST WITH CONTRAST TECHNIQUE: Multidetector CT imaging of the chest was performed using the standard protocol during bolus administration of intravenous contrast. Multiplanar CT image reconstructions and MIPs were obtained to evaluate the vascular anatomy. CONTRAST:  100 mL Isovue 370 intravenous COMPARISON:  04/28/2017, 02/25/2017, 12/08/2016 FINDINGS: Cardiovascular: Suboptimal opacification of the distal branch vessels, nondiagnostic  for assessment of embolus particularly within the subsegmental lower lobe branch vessels. No central filling defect are visualized. Enlarged pulmonary artery trunk up to 3.4 cm suggestive of hypertension. Aortic atherosclerosis. Coronary artery calcification. Cardiomegaly. No significant effusion. Reflux of contrast into the hepatic veins consistent with elevated right heart pressure. Mediastinum/Nodes: Midline trachea. No thyroid mass. Nonspecific subcentimeter mediastinal lymph nodes. Esophagus unremarkable. Lungs/Pleura: Moderate emphysema. Mild bronchiectasis in the lower lobes with bilateral subpleural fibrosis. Partial consolidation in the right upper lobe. No pleural effusion. Upper Abdomen: No acute abnormality. Musculoskeletal: Degenerative changes. No acute or suspicious bone lesion. Review of the MIP images confirms the above findings.  IMPRESSION: 1. Suboptimal study due to poor contrast opacification of distal branch vessels, nondiagnostic for evaluation of subsegmental embolus in the bilateral lower lobes. There is no evidence for an acute central pulmonary embolus. 2. Cardiomegaly with enlarged main pulmonary vessels suggesting pulmonary artery hypertension. Reflux of contrast into the hepatic veins suggests elevated right heart pressures. 3. Moderate emphysema with scattered areas of subpleural fibrosis and mild bronchiectasis in the lower lobes. Probable focal atelectasis in the right upper lobe. Aortic Atherosclerosis (ICD10-I70.0) and Emphysema (ICD10-J43.9). Electronically Signed   By: Donavan Foil M.D.   On: 04/29/2017 02:50   Dg Chest Port 1 View  Result Date: 04/28/2017 CLINICAL DATA:  Generalized weakness and dyspnea. EXAM: PORTABLE CHEST 1 VIEW COMPARISON:  04/12/2017.  02/24/2017.  11/19/2015. FINDINGS: 1645 hours. Low lung volumes. Diffuse interstitial and patchy basilar airspace opacity is similar to prior and compatible with chronic underlying lung disease given the relative  stability. The cardio pericardial silhouette is enlarged. The visualized bony structures of the thorax are intact. Telemetry leads overlie the chest. . IMPRESSION: 1. Stable appearance of chronic interstitial lung disease. 2. No new or progressive findings. Electronically Signed   By: Misty Stanley M.D.   On: 04/28/2017 16:57        Scheduled Meds: . aspirin EC  81 mg Oral Daily  . atorvastatin  20 mg Oral Daily  . dextromethorphan-guaiFENesin  1 tablet Oral BID  . enoxaparin (LOVENOX) injection  40 mg Subcutaneous Daily  . famotidine  20 mg Oral QHS  . feeding supplement (PRO-STAT SUGAR FREE 64)  30 mL Oral BID  . furosemide  20 mg Intravenous Daily  . glipiZIDE  2.5 mg Oral BID WC   And  . metFORMIN  500 mg Oral BID WC  . insulin aspart  0-5 Units Subcutaneous QHS  . insulin aspart  0-9 Units Subcutaneous TID WC  . loratadine  10 mg Oral Daily  . pantoprazole  40 mg Oral Daily  . potassium chloride SA  20 mEq Oral Daily  . predniSONE  20 mg Oral Q breakfast  . sodium chloride flush  3 mL Intravenous Q12H  . sodium chloride flush  3 mL Intravenous Q12H   Continuous Infusions: . sodium chloride 1,000 mL (04/28/17 1710)  . sodium chloride    . diltiazem (CARDIZEM) infusion 5 mg/hr (04/29/17 0222)     LOS: 1 day    Time spent: 71 min    McCook, DO [facility] Pager [phone]  If 7PM-7AM, please contact night-coverage [email or web address] Password [facility] 04/29/2017, 2:32 PM

## 2017-04-29 NOTE — Progress Notes (Signed)
Pt back from CT, O2 sats in 50s on 6L. Placed on NRB, O2 sats 90-91%.

## 2017-04-29 NOTE — Progress Notes (Signed)
Notified on-call [facility] regarding Troponin results.

## 2017-04-30 ENCOUNTER — Other Ambulatory Visit (HOSPITAL_COMMUNITY): Payer: Medicare Other

## 2017-04-30 ENCOUNTER — Inpatient Hospital Stay (HOSPITAL_COMMUNITY): Payer: Medicare Other

## 2017-04-30 DIAGNOSIS — J8410 Pulmonary fibrosis, unspecified: Secondary | ICD-10-CM

## 2017-04-30 DIAGNOSIS — M79609 Pain in unspecified limb: Secondary | ICD-10-CM

## 2017-04-30 LAB — HEMOGLOBIN A1C
Hgb A1c MFr Bld: 6.9 % — ABNORMAL HIGH (ref 4.8–5.6)
Mean Plasma Glucose: 151 mg/dL

## 2017-04-30 LAB — GLUCOSE, CAPILLARY
GLUCOSE-CAPILLARY: 56 mg/dL — AB (ref 65–99)
GLUCOSE-CAPILLARY: 127 mg/dL — AB (ref 65–99)
GLUCOSE-CAPILLARY: 239 mg/dL — AB (ref 65–99)
GLUCOSE-CAPILLARY: 124 mg/dL — AB (ref 65–99)

## 2017-04-30 MED ORDER — DILTIAZEM HCL 30 MG PO TABS
30.0000 mg | ORAL_TABLET | Freq: Four times a day (QID) | ORAL | Status: DC
  Administered 2017-04-30 – 2017-05-01 (×5): 30 mg via ORAL
  Filled 2017-04-30 (×5): qty 1

## 2017-04-30 MED ORDER — FUROSEMIDE 10 MG/ML IJ SOLN
80.0000 mg | Freq: Two times a day (BID) | INTRAMUSCULAR | Status: DC
  Administered 2017-04-30 – 2017-05-05 (×11): 80 mg via INTRAVENOUS
  Filled 2017-04-30 (×11): qty 8

## 2017-04-30 MED ORDER — SODIUM CHLORIDE 0.9% FLUSH
3.0000 mL | Freq: Two times a day (BID) | INTRAVENOUS | Status: DC
  Administered 2017-04-30 – 2017-05-05 (×10): 3 mL via INTRAVENOUS

## 2017-04-30 NOTE — Progress Notes (Signed)
PROGRESS NOTE  Gilbert Dominguez  BUY:[id number] DOB: [birth year] DOA: 04/28/2017 PCP: Leonard Downing, MD  Outpatient Specialists: Cardiology, [facility], [facility]  Brief Narrative: Gilbert Dominguez is a 71 y.o. male with a history of chronic hypoxic respiratory failure, severe pulmonary HTN and fibrosis, chronic diastolic CHF, stage III CKD, RA, and T2DM who was brought to the ED by EMS after he was found to be tachycardic when he called EMS for his wife who fell at home. He felt dizzy when rising too quickly and felt weak. Treatment with adenosine per cardiology recommendations for SVT caused temporary slowing of heart rate. He felt dyspneic with 2+ LE edema. ECG demonstrated atrial flutter with RVR, LPFB and RBBB. CTA chest showed no PE, but did show reflux into hepatic vein consistent with elevated right heart pressures. He was started on cardizem gtt and admitted with cardiology consultation. His weight on admission was 128lbs, up from dry weight of 123lbs. Eliquis was started and aspirin was subsequently stopped. IV lasix has been given. Troponin has been elevated mildly with flat trend and no chest pain. No ischemic work up is advised by cardiology.   Assessment & Plan: Principal Problem:   Atrial tachycardia (HCC) Active Problems:   CHF exacerbation (HCC)   Tachycardia   Hypokalemia   Hyperglycemia   Atrial flutter (HCC)   Pulmonary HTN (HCC)  SVT/atrial flutter with RVR: TSH 1.824.  - Continue diltiazem per cardiology - Loading digoxin for improved rate control. Avoiding amiodarone with pulm fibrosis.  - Continuous cardiac monitoring in SDU - Echocardiogram ordered - CHA2DS2-VASc Scoreis 3, started on eliquis 40m BID   Acute on chronic diastolic and right venticular CHF: Above EDW of 123lbs on admission. BNP 2,262.7.  - Strict I/O - Daily weights - Augment diuresis to lasix 432mIV BID - LE U/S ordered for LE edema, no DVT  Chronic hypoxic respiratory  failure due to pulmonary fibrosis and pulmonary HTN: 4L at rest, 8L on exertion at baseline, [facility]. No current evidence of exacerbation.  - Continue home therapies  Demand ischemia: Minimal and flat troponin elevation in setting of rapid heart rate. No chest pain.  - No ischemic evaluation planned per cardiology  Hypokalemia - Replete cautiously with kidney impairment - Monitor with diuresis  T2DM: Well-controlled with HbA1c 6.9%.  - SSI  Stage III CKD: Scr at baseline of 1.2 on admission - Monitor with diuresis - Minimize nephrotoxins  DVT prophylaxis: Eliquis Code Status: Full Family Communication: None at bedside Disposition Plan: [place] once euvolemic, tachycardia resolved.   Consultants:   Cardiology, Dr. TuRadford PaxProcedures:   None  Antimicrobials:  None   Subjective: Dyspnea still worse than baseline, leg swelling stable, not urinating a lot since given lasix. No palpitations or chest pain  Objective: Vitals:   04/30/17 0735 04/30/17 0742 04/30/17 1145 04/30/17 1605  BP: 97/84 106/69 112/88 124/79  Pulse: 60 (!) 55 74 69  Resp: 14 16 (!) 22 20  Temp: 98.1 F (36.7 C)  98.2 F (36.8 C) 98.3 F (36.8 C)  TempSrc: Oral  Oral Oral  SpO2: 100% 100% 97% 95%  Weight:      Height:        Intake/Output Summary (Last 24 hours) at 04/30/17 1627 Last data filed at 04/30/17 1532  Gross per 24 hour  Intake           1084.3 ml  Output  1825 ml  Net           -740.7 ml   Filed Weights   04/28/17 2150 04/29/17 0416 04/30/17 0426  Weight: 56.5 kg (124 lb 8 oz) 58.2 kg (128 lb 4.8 oz) 60.2 kg (132 lb 11.2 oz)    Examination: General exam: Chronically ill-appearing male in no distress  Respiratory system: Tachypneic on 4L by [place]. Diffuse bilateral crackles in all lung zone, worse at bases. No wheezing.   Cardiovascular system: Rapid irregular. No murmur, rub, or gallop. No JVD, and 2+ pitting LE  edema. Gastrointestinal system: Abdomen soft, non-tender, non-distended, with normoactive bowel sounds. No organomegaly or masses felt. Central nervous system: Alert and oriented. No focal neurological deficits. Extremities: Warm, no deformities Skin: No rashes, lesions no ulcers Psychiatry: Judgement and insight appear normal. Mood & affect appropriate.   Data Reviewed: I have personally reviewed following labs and imaging studies  CBC:  Recent Labs Lab 04/28/17 1725 04/29/17 0443  WBC 11.8* 10.0  NEUTROABS 11.1*  --   HGB 13.0 14.0  HCT 40.0 43.3  MCV 91.7 93.1  PLT 197 415   Basic Metabolic Panel:  Recent Labs Lab 04/28/17 1725 04/29/17 0443  NA 141 135  K 3.4* 3.9  CL 101 100*  CO2 28 27  GLUCOSE 116* 165*  BUN 40* 42*  CREATININE 1.15 1.20  CALCIUM 9.0 8.3*  MG  --  1.9  PHOS  --  3.5   GFR: Estimated Creatinine Clearance: 44.8 mL/min (by C-G formula based on SCr of 1.2 mg/dL). Liver Function Tests:  Recent Labs Lab 04/28/17 1725 04/29/17 0443  AST 73* 71*  ALT 39 38  ALKPHOS 88 86  BILITOT 2.3* 1.7*  PROT 6.6 5.6*  ALBUMIN 3.1* 2.8*   No results for input(s): LIPASE, AMYLASE in the last 168 hours. No results for input(s): AMMONIA in the last 168 hours. Coagulation Profile:  Recent Labs Lab 04/28/17 1725  INR 1.33   Cardiac Enzymes:  Recent Labs Lab 04/28/17 2303 04/29/17 0443 04/29/17 1018  TROPONINI 0.06* 0.07* 0.05*   BNP (last 3 results)  Recent Labs  01/20/17 1642  PROBNP 1,867.0*   HbA1C:  Recent Labs  04/28/17 2303  HGBA1C 6.9*   CBG:  Recent Labs Lab 04/29/17 1645 04/29/17 2114 04/30/17 0734 04/30/17 1121 04/30/17 1603  GLUCAP 108* 143* 56* 127* 239*   Lipid Profile: No results for input(s): CHOL, HDL, LDLCALC, TRIG, CHOLHDL, LDLDIRECT in the last 72 hours. Thyroid Function Tests:  Recent Labs  04/28/17 2303  TSH 1.824   Anemia Panel: No results for input(s): VITAMINB12, FOLATE, FERRITIN, TIBC,  IRON, RETICCTPCT in the last 72 hours. Urine analysis:    Component Value Date/Time   COLORURINE COLORLESS (A) 12/08/2016 0604   APPEARANCEUR CLEAR 12/08/2016 0604   LABSPEC 1.006 12/08/2016 0604   PHURINE 6.0 12/08/2016 0604   GLUCOSEU NEGATIVE 12/08/2016 0604   HGBUR NEGATIVE 12/08/2016 0604   BILIRUBINUR NEGATIVE 12/08/2016 0604   KETONESUR NEGATIVE 12/08/2016 0604   PROTEINUR NEGATIVE 12/08/2016 0604   NITRITE NEGATIVE 12/08/2016 0604   LEUKOCYTESUR NEGATIVE 12/08/2016 0604   Recent Results (from the past 240 hour(s))  MRSA PCR Screening     Status: None   Collection Time: 04/28/17 11:02 PM  Result Value Ref Range Status   MRSA by PCR NEGATIVE NEGATIVE Final    Comment:        The GeneXpert MRSA Assay (FDA approved for NASAL specimens only), is one component of a comprehensive MRSA  colonization surveillance program. It is not intended to diagnose MRSA infection nor to guide or monitor treatment for MRSA infections.       Radiology Studies: Ct Angio Chest Pe W Or Wo Contrast  Result Date: 04/29/2017 CLINICAL DATA:  Shortness of breath with elevated D-dimer EXAM: CT ANGIOGRAPHY CHEST WITH CONTRAST TECHNIQUE: Multidetector CT imaging of the chest was performed using the standard protocol during bolus administration of intravenous contrast. Multiplanar CT image reconstructions and MIPs were obtained to evaluate the vascular anatomy. CONTRAST:  100 mL Isovue 370 intravenous COMPARISON:  04/28/2017, 02/25/2017, 12/08/2016 FINDINGS: Cardiovascular: Suboptimal opacification of the distal branch vessels, nondiagnostic for assessment of embolus particularly within the subsegmental lower lobe branch vessels. No central filling defect are visualized. Enlarged pulmonary artery trunk up to 3.4 cm suggestive of hypertension. Aortic atherosclerosis. Coronary artery calcification. Cardiomegaly. No significant effusion. Reflux of contrast into the hepatic veins consistent with elevated right  heart pressure. Mediastinum/Nodes: Midline trachea. No thyroid mass. Nonspecific subcentimeter mediastinal lymph nodes. Esophagus unremarkable. Lungs/Pleura: Moderate emphysema. Mild bronchiectasis in the lower lobes with bilateral subpleural fibrosis. Partial consolidation in the right upper lobe. No pleural effusion. Upper Abdomen: No acute abnormality. Musculoskeletal: Degenerative changes. No acute or suspicious bone lesion. Review of the MIP images confirms the above findings. IMPRESSION: 1. Suboptimal study due to poor contrast opacification of distal branch vessels, nondiagnostic for evaluation of subsegmental embolus in the bilateral lower lobes. There is no evidence for an acute central pulmonary embolus. 2. Cardiomegaly with enlarged main pulmonary vessels suggesting pulmonary artery hypertension. Reflux of contrast into the hepatic veins suggests elevated right heart pressures. 3. Moderate emphysema with scattered areas of subpleural fibrosis and mild bronchiectasis in the lower lobes. Probable focal atelectasis in the right upper lobe. Aortic Atherosclerosis (ICD10-I70.0) and Emphysema (ICD10-J43.9). Electronically Signed   By: Donavan Foil M.D.   On: 04/29/2017 02:50   Dg Chest Port 1 View  Result Date: 04/28/2017 CLINICAL DATA:  Generalized weakness and dyspnea. EXAM: PORTABLE CHEST 1 VIEW COMPARISON:  04/12/2017.  02/24/2017.  11/19/2015. FINDINGS: 1645 hours. Low lung volumes. Diffuse interstitial and patchy basilar airspace opacity is similar to prior and compatible with chronic underlying lung disease given the relative stability. The cardio pericardial silhouette is enlarged. The visualized bony structures of the thorax are intact. Telemetry leads overlie the chest. . IMPRESSION: 1. Stable appearance of chronic interstitial lung disease. 2. No new or progressive findings. Electronically Signed   By: Misty Stanley M.D.   On: 04/28/2017 16:57    Scheduled Meds: . apixaban  5 mg Oral BID  .  atorvastatin  20 mg Oral Daily  . dextromethorphan-guaiFENesin  1 tablet Oral BID  . diltiazem  30 mg Oral Q6H  . famotidine  20 mg Oral QHS  . feeding supplement (PRO-STAT SUGAR FREE 64)  30 mL Oral BID  . furosemide  80 mg Intravenous BID  . [START ON 05/01/2017] metFORMIN  500 mg Oral BID WC   And  . glipiZIDE  2.5 mg Oral BID WC  . insulin aspart  0-5 Units Subcutaneous QHS  . insulin aspart  0-9 Units Subcutaneous TID WC  . loratadine  10 mg Oral Daily  . pantoprazole  40 mg Oral Daily  . potassium chloride SA  20 mEq Oral Daily  . predniSONE  20 mg Oral Q breakfast  . sodium chloride flush  3 mL Intravenous Q12H  . sodium chloride flush  3 mL Intravenous Q12H   Continuous Infusions:   LOS:  2 days   Time spent: 25 minutes.  Vance Gather, MD [facility] Pager [phone]  If 7PM-7AM, please contact night-coverage [email or web address] Password [facility] 04/30/2017, 4:27 PM

## 2017-04-30 NOTE — Progress Notes (Signed)
HR remains 60s, appears to be in NSR.  Cardizem gtt decreased to 5 mg/hr.  Report given to day shift nurse, Cyril Mourning.  Will continue to monitor.  Jodell Cipro

## 2017-04-30 NOTE — Discharge Instructions (Signed)

## 2017-04-30 NOTE — Progress Notes (Addendum)
Progress Note  Patient Name: Gilbert Dominguez Date of Encounter: 04/30/2017  Primary Cardiologist: Dr. Debara Pickett  Subjective   Feeling much better today.  Converted to NSR with PACs, PVCs and multifocal atrial rhythm  overnight and HR in the 60's.   Inpatient Medications    Scheduled Meds: . apixaban  5 mg Oral BID  . atorvastatin  20 mg Oral Daily  . dextromethorphan-guaiFENesin  1 tablet Oral BID  . digoxin  0.25 mg Intravenous Q6H  . famotidine  20 mg Oral QHS  . feeding supplement (PRO-STAT SUGAR FREE 64)  30 mL Oral BID  . furosemide  40 mg Intravenous BID  . [START ON 05/01/2017] metFORMIN  500 mg Oral BID WC   And  . glipiZIDE  2.5 mg Oral BID WC  . insulin aspart  0-5 Units Subcutaneous QHS  . insulin aspart  0-9 Units Subcutaneous TID WC  . loratadine  10 mg Oral Daily  . pantoprazole  40 mg Oral Daily  . potassium chloride SA  20 mEq Oral Daily  . predniSONE  20 mg Oral Q breakfast  . sodium chloride flush  3 mL Intravenous Q12H  . sodium chloride flush  3 mL Intravenous Q12H   Continuous Infusions: . sodium chloride 250 mL (04/29/17 1545)  . diltiazem (CARDIZEM) infusion Stopped (04/30/17 0745)   PRN Meds: sodium chloride, sodium chloride flush   Vital Signs    Vitals:   04/30/17 0426 04/30/17 0728 04/30/17 0735 04/30/17 0742  BP: 106/68 114/70 97/84 106/69  Pulse: 66 (!) 112 60 (!) 55  Resp: (!) 24 (!) _0 Temp: 97.8 F (36.6 C) 98.1 F (36.7 C) 98.1 F (36.7 C)   TempSrc: Oral Oral Oral   SpO2: 100% 97% 100% 100%  Weight: 132 lb 11.2 oz (60.2 kg)     Height:        Intake/Output Summary (Last 24 hours) at 04/30/17 0828 Last data filed at 04/30/17 0700  Gross per 24 hour  Intake            871.8 ml  Output              925 ml  Net            -53.2 ml   Filed Weights   04/28/17 2150 04/29/17 0416 04/30/17 0426  Weight: 124 lb 8 oz (56.5 kg) 128 lb 4.8 oz (58.2 kg) 132 lb 11.2 oz (60.2 kg)    Telemetry    NSR with intermittent  PACs, PVCs and mutifocal atrial rhythm - Personally Reviewed  ECG    No new EKG to review - Personally Reviewed  Physical Exam   GEN: No acute distress.   Neck: No JVD Cardiac: RRR, no murmurs, rubs, or gallops.  Respiratory: diffuse fine crackles of pulmonary fibrosis GI: Soft, nontender, non-distended  MS:2+ edema LE bilaterally Neuro:  Nonfocal  Psych: Normal affect   Labs    Chemistry Recent Labs Lab 04/28/17 1725 04/29/17 0443  NA 141 135  K 3.4* 3.9  CL 101 100*  CO2 28 27  GLUCOSE 116* 165*  BUN 40* 42*  CREATININE 1.15 1.20  CALCIUM 9.0 8.3*  PROT 6.6 5.6*  ALBUMIN 3.1* 2.8*  AST 73* 71*  ALT 39 38  ALKPHOS 88 86  BILITOT 2.3* 1.7*  GFRNONAA >60 59*  GFRAA >60 >60  ANIONGAP 12 8     Hematology Recent Labs Lab 04/28/17 1725 04/29/17 0443  WBC 11.8* 10.0  RBC 4.36 4.65  HGB 13.0 14.0  HCT 40.0 43.3  MCV 91.7 93.1  MCH 29.8 30.1  MCHC 32.5 32.3  RDW 18.2* 18.4*  PLT 197 220    Cardiac Enzymes Recent Labs Lab 04/28/17 2303 04/29/17 0443 04/29/17 1018  TROPONINI 0.06* 0.07* 0.05*   No results for input(s): TROPIPOC in the last 168 hours.   BNP Recent Labs Lab 04/28/17 1725  BNP 2,262.7*     DDimer  Recent Labs Lab 04/28/17 2303  DDIMER 0.90*     Radiology    Ct Angio Chest Pe W Or Wo Contrast  Result Date: 04/29/2017 CLINICAL DATA:  Shortness of breath with elevated D-dimer EXAM: CT ANGIOGRAPHY CHEST WITH CONTRAST TECHNIQUE: Multidetector CT imaging of the chest was performed using the standard protocol during bolus administration of intravenous contrast. Multiplanar CT image reconstructions and MIPs were obtained to evaluate the vascular anatomy. CONTRAST:  100 mL Isovue 370 intravenous COMPARISON:  04/28/2017, 02/25/2017, 12/08/2016 FINDINGS: Cardiovascular: Suboptimal opacification of the distal branch vessels, nondiagnostic for assessment of embolus particularly within the subsegmental lower lobe branch vessels. No central  filling defect are visualized. Enlarged pulmonary artery trunk up to 3.4 cm suggestive of hypertension. Aortic atherosclerosis. Coronary artery calcification. Cardiomegaly. No significant effusion. Reflux of contrast into the hepatic veins consistent with elevated right heart pressure. Mediastinum/Nodes: Midline trachea. No thyroid mass. Nonspecific subcentimeter mediastinal lymph nodes. Esophagus unremarkable. Lungs/Pleura: Moderate emphysema. Mild bronchiectasis in the lower lobes with bilateral subpleural fibrosis. Partial consolidation in the right upper lobe. No pleural effusion. Upper Abdomen: No acute abnormality. Musculoskeletal: Degenerative changes. No acute or suspicious bone lesion. Review of the MIP images confirms the above findings. IMPRESSION: 1. Suboptimal study due to poor contrast opacification of distal branch vessels, nondiagnostic for evaluation of subsegmental embolus in the bilateral lower lobes. There is no evidence for an acute central pulmonary embolus. 2. Cardiomegaly with enlarged main pulmonary vessels suggesting pulmonary artery hypertension. Reflux of contrast into the hepatic veins suggests elevated right heart pressures. 3. Moderate emphysema with scattered areas of subpleural fibrosis and mild bronchiectasis in the lower lobes. Probable focal atelectasis in the right upper lobe. Aortic Atherosclerosis (ICD10-I70.0) and Emphysema (ICD10-J43.9). Electronically Signed   By: Donavan Foil M.D.   On: 04/29/2017 02:50   Dg Chest Port 1 View  Result Date: 04/28/2017 CLINICAL DATA:  Generalized weakness and dyspnea. EXAM: PORTABLE CHEST 1 VIEW COMPARISON:  04/12/2017.  02/24/2017.  11/19/2015. FINDINGS: 1645 hours. Low lung volumes. Diffuse interstitial and patchy basilar airspace opacity is similar to prior and compatible with chronic underlying lung disease given the relative stability. The cardio pericardial silhouette is enlarged. The visualized bony structures of the thorax are  intact. Telemetry leads overlie the chest. . IMPRESSION: 1. Stable appearance of chronic interstitial lung disease. 2. No new or progressive findings. Electronically Signed   By: Misty Stanley M.D.   On: 04/28/2017 16:57    Cardiac Studies   2D echo 11/2016 Study Conclusions  - Left ventricle: The cavity size was normal. Systolic function was   normal. The estimated ejection fraction was in the range of 55%   to 60%. Wall motion was normal; there were no regional wall   motion abnormalities. Doppler parameters are consistent with   abnormal left ventricular relaxation (grade 1 diastolic   dysfunction). - Ventricular septum: D-shaped interventricular septum suggestive   of RV pressure/volume overload. - Aortic valve: Trileaflet; moderately calcified leaflets. There   was mild stenosis. Mean gradient (S): 10 mm Hg.  Valve area (VTI):   1.52 cm^2. - Mitral valve: There was no significant regurgitation. - Right ventricle: The cavity size was severely dilated. Systolic   function was moderately reduced. - Right atrium: The atrium was moderately dilated. - Tricuspid valve: Peak RV-RA gradient (S): 61 mm Hg. - Systemic veins: IVC not visualized. - Pericardium, extracardiac: A trivial pericardial effusion was   identified.  Impressions:  - Normal LV size with EF 55-60%. D-shaped interventricular septum   suggestive of RV pressure/volume overload. 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 Severe pulmonary   hypertension. Mild aortic stenosis.   Patient Profile     71 y.o. male  with a history of severe pulmonary fibrosis on home O2, severe pulmonary HTN, RA, remote tobacco use and CKD stage III.  He noticed worsening SOB and says that he was worried about his wife who had fallen and got up too fast and felt very dizzy.  EMS came to assess his wife and he stated that he was weak and upon evaluation was found to be in SVT and was given adenosine which slowed the HR but  then HR went back up and noted to be atrial flutter with RVR.    Assessment & Plan    1. New onset Atrial flutter with RVR - converted overnight to NSR with PVCs and PACs and appears to be multifocal atrial rhythm now - CHADS2VASC score is 3 and started on Eliquis 62m BID - not a candidate for amiodarone due to pulmonary fibrosis - d/c dig as LVF is normal - D/C Cardizem gtt - start Cardizem [id number]  2. Elevated troponin - 0.06 --> 0.07 --> 0.05 - this is likely related to demand ischemia in the presence of tachycardia - pt denies chest pain - will not pursue ischemic evaluation at this time.  3. Acute on chronic diastolic heart failure - BNP elevated at 2262 with LE edema.  No edema on Cxray.  - started 40 mg IV lasix BID but has only put out 1L - overnight diuresed 1L.  He is net neg 68cc. His weight is up 4lbs. - increase Lasix to 8054mV BID - BMET pending today. - follow I&O's closely and daily weights  4. CKD stage III - sCr 1.20, baseline appears 1.1-1.2 - BMET pending today   Signed, TraFransico HimD  04/30/2017, 8:28 AM

## 2017-04-30 NOTE — Progress Notes (Signed)
Pt noted to be in NSR per nightshift RN at shift change. Nightshift RN decreased cardizem gtt from 7.49m to 589m Drip stopped due to HR in 50s, currently still NSR.

## 2017-04-30 NOTE — Care Management Note (Addendum)
Case Management Note  Patient Details  Name: Gilbert Dominguez MRN: [id number] Date of Birth: [birth year]  Subjective/Objective: Pt presented for A Fib. Plan for d/c on Eliquis. Benefits Check completed. Staff RN to provide 30 day free card to patient.                    Action/Plan: ELIQUIS 5 MG  BID   AND 2.5 MG BID   COVER- YES  CO- PAY- $ 35.00  TIER- 2 DRUG  PRIOR APPROVAL- NO    2. XARELTO 15 MG  BID  AND 20 MG DAILY   COVER- YES  CO-PAY- $ 35.00  TIER- 2 DRUG  PRIOR APPROVAL NO   MAIL-ORDER FOR 90 DAYS SUPPLY $ 70.00   PHARMACY : [facility]   Expected Discharge Date:                  Expected Discharge Plan:  Home/Self Care  In-House Referral:     Discharge planning Services  CM Consult, Medication Assistance; Homebound not met per provider.   Post Acute Care Choice:   N/A Choice offered to:   N/A  DME Arranged:   N/A DME Agency:   N/A  HH Arranged:   N/A HH Agency:   N/A  Status of Service:  COMPLETED If discussed at Long Length of Stay Meetings, dates discussed:    Additional Comments: 05-05-17 [place], RN, BSN [phone] PT recommendations for [facility] PT- Pt is not homebound and will not meet criteria for Home Services at this time. 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 No further needs from CM at this time.  Bethena Roys, RN 04/30/2017, 4:39 PM

## 2017-04-30 NOTE — Progress Notes (Signed)
*  PRELIMINARY RESULTS* Vascular Ultrasound Left lower extremity venous duplex has been completed.  Preliminary findings: No evidence of deep vein thrombosis in the left lower extremity.  Large amount of pitting edema noted.  Negative for bakers cyst on the left.    Everrett Coombe 04/30/2017, 2:24 PM

## 2017-04-30 NOTE — Progress Notes (Signed)
HR 60s, Cardizem gtt decreased to 7.5 mg/hr.  Will continue to monitor.  Jodell Cipro

## 2017-05-01 ENCOUNTER — Inpatient Hospital Stay (HOSPITAL_COMMUNITY): Payer: Medicare Other

## 2017-05-01 DIAGNOSIS — I360 Nonrheumatic tricuspid (valve) stenosis: Secondary | ICD-10-CM

## 2017-05-01 LAB — GLUCOSE, CAPILLARY
GLUCOSE-CAPILLARY: 59 mg/dL — AB (ref 65–99)
Glucose-Capillary: 122 mg/dL — ABNORMAL HIGH (ref 65–99)
Glucose-Capillary: 167 mg/dL — ABNORMAL HIGH (ref 65–99)
Glucose-Capillary: 211 mg/dL — ABNORMAL HIGH (ref 65–99)

## 2017-05-01 LAB — COMPREHENSIVE METABOLIC PANEL
ALK PHOS: 116 U/L (ref 38–126)
ALT: 48 U/L (ref 17–63)
AST: 80 U/L — AB (ref 15–41)
Albumin: 2.8 g/dL — ABNORMAL LOW (ref 3.5–5.0)
Anion gap: 7 (ref 5–15)
BUN: 35 mg/dL — AB (ref 6–20)
CALCIUM: 9 mg/dL (ref 8.9–10.3)
CHLORIDE: 103 mmol/L (ref 101–111)
CO2: 30 mmol/L (ref 22–32)
CREATININE: 0.95 mg/dL (ref 0.61–1.24)
GFR calc Af Amer: >60 mL/min (ref >60)
Glucose, Bld: 47 mg/dL — ABNORMAL LOW (ref 65–99)
Potassium: 3.5 mmol/L (ref 3.5–5.1)
SODIUM: 140 mmol/L (ref 135–145)
Total Bilirubin: 1 mg/dL (ref 0.3–1.2)
Total Protein: 5.9 g/dL — ABNORMAL LOW (ref 6.5–8.1)

## 2017-05-01 LAB — CBC
HEMATOCRIT: 43.4 % (ref 39.0–52.0)
Hemoglobin: 13.9 g/dL (ref 13.0–17.0)
MCH: 29.8 pg (ref 26.0–34.0)
MCHC: 32 g/dL (ref 30.0–36.0)
MCV: 93.1 fL (ref 78.0–100.0)
PLATELETS: 251 10*3/uL (ref 150–400)
RBC: 4.66 MIL/uL (ref 4.22–5.81)
RDW: 18.2 % — ABNORMAL HIGH (ref 11.5–15.5)
WBC: 10.2 10*3/uL (ref 4.0–10.5)

## 2017-05-01 LAB — MAGNESIUM: Magnesium: 2 mg/dL (ref 1.7–2.4)

## 2017-05-01 MED ORDER — DILTIAZEM HCL ER COATED BEADS 120 MG PO CP24
120.0000 mg | ORAL_CAPSULE | Freq: Every day | ORAL | Status: DC
  Administered 2017-05-02 – 2017-05-05 (×4): 120 mg via ORAL
  Filled 2017-05-01 (×4): qty 1

## 2017-05-01 NOTE — Plan of Care (Signed)
Problem: Pain Managment: Goal: General experience of comfort will improve Outcome: Completed/Met Date Met: 05/01/17 Denies c/o pain or discomfort.  Problem: Fluid Volume: Goal: Ability to maintain a balanced intake and output will improve Outcome: Progressing Diuresing well with IV lasix.

## 2017-05-01 NOTE — Progress Notes (Signed)
  Echocardiogram 2D Echocardiogram has been performed.  Taegan Standage T Venetia Prewitt 05/01/2017, 6:01 PM

## 2017-05-01 NOTE — Progress Notes (Signed)
PROGRESS NOTE  Gilbert Dominguez  BTD:[id number] DOB: 1946-10-02 DOA: 04/28/2017 PCP: Gilbert Downing, MD  Outpatient Specialists: Cardiology, [facility] Pulmonology, [facility]  Brief Narrative: Gilbert Dominguez is a 71 y.o. male with a history of chronic hypoxic respiratory failure, severe pulmonary HTN and fibrosis, chronic diastolic CHF, stage III CKD, RA, and T2DM who was brought to the ED by EMS after he was found to be tachycardic when he called EMS for his wife who fell at home. He felt dizzy when rising too quickly and felt weak. Treatment with adenosine per cardiology recommendations for SVT caused temporary slowing of heart rate. He felt dyspneic with 2+ LE edema. ECG demonstrated atrial flutter with RVR, LPFB and RBBB. CTA chest showed no PE, but did show reflux into hepatic vein consistent with elevated right heart pressures. He was started on cardizem gtt and admitted with cardiology consultation. His weight on admission was 128lbs, up from dry weight of 123lbs. Eliquis was started and aspirin was subsequently stopped. IV lasix has been given. Troponin has been elevated mildly with flat trend and no chest pain. No ischemic work up is advised by cardiology.   Assessment & Plan: Principal Problem:   Atrial tachycardia (HCC) Active Problems:   CHF exacerbation (HCC)   Tachycardia   Hypokalemia   Hyperglycemia   Atrial flutter (HCC)   Pulmonary HTN (HCC)  SVT/atrial flutter with RVR: TSH 1.824.  - Diltiazem 37m q6h. Unvalidated data overnight showing bradycardia. Will check telemetry.  - Avoiding amiodarone with pulm fibrosis. No need for digoxin currently.  - Continuous cardiac monitoring in SDU - Echocardiogram ordered - CHA2DS2-VASc Scoreis 3, started on eliquis 56mBID. Monitor CBC.    Acute on chronic diastolic and right venticular CHF: Above EDW of 123lbs on admission. BNP 2,262.7.  - Strict I/O - Daily weights - Lasix 405mV BID is improving volume status, will  continue this pending cardiology recommendations.  - LE U/S ordered for LE edema, no DVT  Chronic hypoxic respiratory failure due to pulmonary fibrosis and pulmonary HTN: 4L at rest, 8L on exertion at baseline, [facility]. No current evidence of exacerbation.  - Continue home therapies  Demand ischemia: Minimal and flat troponin elevation in setting of rapid heart rate. No chest pain.  - No ischemic evaluation planned per cardiology  Hypokalemia - Replete cautiously, will continue 16m49moday due to arrhythmia and ongoing diuresis. - Monitor with diuresis  T2DM: Well-controlled with HbA1c 6.9%.  - SSI  Stage III CKD: Scr at baseline of 1.2 on admission - Improved to 0.95 with diuresis. Continue monitoring.  - Minimize nephrotoxins  DVT prophylaxis: Eliquis Code Status: Full Family Communication: None at bedside Disposition Plan: AntiMononae once euvolemic.  Consultants:   Cardiology, Dr. TurnRadford Paxocedures:   None  Antimicrobials:  None   Subjective: Down plays down dyspnea, states it is bad but it is always bad. Leg swelling remains worse than usual and orthopnea is mildly improved. No chest pain. No bleeding.  Objective: Vitals:   04/30/17 1605 04/30/17 2005 04/30/17 2353 05/01/17 0436  BP: 124/79 116/85 111/82 109/72  Pulse: 69 80 66 66  Resp: 20 (!) 28 16 (!) 22  Temp: 98.3 F (36.8 C) 97.6 F (36.4 C) 97.6 F (36.4 C) (!) 97.5 F (36.4 C)  TempSrc: Oral Axillary Axillary Oral  SpO2: 95% 99% 98% 98%  Weight:    58.7 kg (129 lb 6.4 oz)  Height:  Intake/Output Summary (Last 24 hours) at 05/01/17 1331 Last data filed at 05/01/17 1100  Gross per 24 hour  Intake              960 ml  Output             4627 ml  Net            -3667 ml   Filed Weights   04/29/17 0416 04/30/17 0426 05/01/17 0436  Weight: 58.2 kg (128 lb 4.8 oz) 60.2 kg (132 lb 11.2 oz) 58.7 kg (129 lb 6.4 oz)    Examination: General exam:  Chronically ill-appearing male in no distress sitting on side of the bed Respiratory system: Tachypneic on 4L by [place]. Diffuse bilateral crackles in all lung zone, worse at bases, stable from yesterday. No wheezing.   Cardiovascular system: Rapid irregular. No murmur, rub, or gallop. No JVD, and 2+ pitting LE edema, stable from yesterday. Gastrointestinal system: Abdomen soft, non-tender, non-distended, with normoactive bowel sounds. No organomegaly or masses felt. Central nervous system: Alert and oriented. No focal neurological deficits. Extremities: Warm, no deformities Skin: No rashes, lesions no ulcers. Senile purpura. Psychiatry: Judgement and insight appear normal. Mood & affect appropriate.   Data Reviewed: I have personally reviewed following labs and imaging studies  CBC:  Recent Labs Lab 04/28/17 1725 04/29/17 0443 05/01/17 0308  WBC 11.8* 10.0 10.2  NEUTROABS 11.1*  --   --   HGB 13.0 14.0 13.9  HCT 40.0 43.3 43.4  MCV 91.7 93.1 93.1  PLT 197 220 194   Basic Metabolic Panel:  Recent Labs Lab 04/28/17 1725 04/29/17 0443 05/01/17 0308  NA 141 135 140  K 3.4* 3.9 3.5  CL 101 100* 103  CO2 _0 GLUCOSE 116* 165* 47*  BUN 40* 42* 35*  CREATININE 1.15 1.20 0.95  CALCIUM 9.0 8.3* 9.0  MG  --  1.9 2.0  PHOS  --  3.5  --    GFR: Estimated Creatinine Clearance: 56.6 mL/min (by C-G formula based on SCr of 0.95 mg/dL). Liver Function Tests:  Recent Labs Lab 04/28/17 1725 04/29/17 0443 05/01/17 0308  AST 73* 71* 80*  ALT 39 38 48  ALKPHOS 88 86 116  BILITOT 2.3* 1.7* 1.0  PROT 6.6 5.6* 5.9*  ALBUMIN 3.1* 2.8* 2.8*   No results for input(s): LIPASE, AMYLASE in the last 168 hours. No results for input(s): AMMONIA in the last 168 hours. Coagulation Profile:  Recent Labs Lab 04/28/17 1725  INR 1.33   Cardiac Enzymes:  Recent Labs Lab 04/28/17 2303 04/29/17 0443 04/29/17 1018  TROPONINI 0.06* 0.07* 0.05*   BNP (last 3 results)  Recent Labs   01/20/17 1642  PROBNP 1,867.0*   HbA1C:  Recent Labs  04/28/17 2303  HGBA1C 6.9*   CBG:  Recent Labs Lab 04/30/17 1121 04/30/17 1603 04/30/17 2138 05/01/17 0740 05/01/17 1128  GLUCAP 127* 239* 124* 59* 122*   Lipid Profile: No results for input(s): CHOL, HDL, LDLCALC, TRIG, CHOLHDL, LDLDIRECT in the last 72 hours. Thyroid Function Tests:  Recent Labs  04/28/17 2303  TSH 1.824   Anemia Panel: No results for input(s): VITAMINB12, FOLATE, FERRITIN, TIBC, IRON, RETICCTPCT in the last 72 hours. Urine analysis:    Component Value Date/Time   COLORURINE COLORLESS (A) 12/08/2016 0604   APPEARANCEUR CLEAR 12/08/2016 0604   LABSPEC 1.006 12/08/2016 0604   PHURINE 6.0 12/08/2016 0604   GLUCOSEU NEGATIVE 12/08/2016 0604   HGBUR NEGATIVE 12/08/2016 0604  BILIRUBINUR NEGATIVE 12/08/2016 0604   KETONESUR NEGATIVE 12/08/2016 0604   PROTEINUR NEGATIVE 12/08/2016 0604   NITRITE NEGATIVE 12/08/2016 0604   LEUKOCYTESUR NEGATIVE 12/08/2016 0604   Recent Results (from the past 240 hour(s))  MRSA PCR Screening     Status: None   Collection Time: 04/28/17 11:02 PM  Result Value Ref Range Status   MRSA by PCR NEGATIVE NEGATIVE Final    Comment:        The GeneXpert MRSA Assay (FDA approved for NASAL specimens only), is one component of a comprehensive MRSA colonization surveillance program. It is not intended to diagnose MRSA infection nor to guide or monitor treatment for MRSA infections.       Radiology Studies: No results found.  Scheduled Meds: . apixaban  5 mg Oral BID  . atorvastatin  20 mg Oral Daily  . dextromethorphan-guaiFENesin  1 tablet Oral BID  . diltiazem  30 mg Oral Q6H  . famotidine  20 mg Oral QHS  . feeding supplement (PRO-STAT SUGAR FREE 64)  30 mL Oral BID  . furosemide  80 mg Intravenous BID  . metFORMIN  500 mg Oral BID WC   And  . glipiZIDE  2.5 mg Oral BID WC  . insulin aspart  0-5 Units Subcutaneous QHS  . insulin aspart  0-9 Units  Subcutaneous TID WC  . loratadine  10 mg Oral Daily  . pantoprazole  40 mg Oral Daily  . potassium chloride SA  20 mEq Oral Daily  . predniSONE  20 mg Oral Q breakfast  . sodium chloride flush  3 mL Intravenous Q12H  . sodium chloride flush  3 mL Intravenous Q12H   Continuous Infusions:   LOS: 3 days   Time spent: 25 minutes.  Vance Gather, MD [facility] Pager [phone]  If 7PM-7AM, please contact night-coverage [email or web address] Password [email or web address] 05/01/2017, 1:31 PM

## 2017-05-01 NOTE — Progress Notes (Addendum)
Progress Note  Patient Name: Gilbert Dominguez Date of Encounter: 05/01/2017  Primary Cardiologist: Dr. Debara Pickett   Patient Profile     71 y.o. male  with a history of severe pulmonary fibrosis on home O2, severe pulmonary HTN, RA, remote tobacco use and CKD stage III.  He noticed worsening SOB and says that he was worried about his wife who had fallen and got up too fast and felt very dizzy.  EMS came to assess his wife and he stated that he was weak and upon evaluation was found to be in SVT and was given adenosine which slowed the HR but then HR went back up and noted to be atrial flutter with RVR.     Subjective  brathing better but not at baseline Still with edema which he thinks is 2-3 months getting wrose  Inpatient Medications    Scheduled Meds: . apixaban  5 mg Oral BID  . atorvastatin  20 mg Oral Daily  . dextromethorphan-guaiFENesin  1 tablet Oral BID  . diltiazem  30 mg Oral Q6H  . famotidine  20 mg Oral QHS  . feeding supplement (PRO-STAT SUGAR FREE 64)  30 mL Oral BID  . furosemide  80 mg Intravenous BID  . metFORMIN  500 mg Oral BID WC   And  . glipiZIDE  2.5 mg Oral BID WC  . insulin aspart  0-5 Units Subcutaneous QHS  . insulin aspart  0-9 Units Subcutaneous TID WC  . loratadine  10 mg Oral Daily  . pantoprazole  40 mg Oral Daily  . potassium chloride SA  20 mEq Oral Daily  . predniSONE  20 mg Oral Q breakfast  . sodium chloride flush  3 mL Intravenous Q12H  . sodium chloride flush  3 mL Intravenous Q12H   Continuous Infusions:  PRN Meds:    Vital Signs    Vitals:   04/30/17 2005 04/30/17 2353 05/01/17 0436 05/01/17 1345  BP: 116/85 111/82 109/72 110/78  Pulse: 80 66 66 70  Resp: (!) 28 16 (!) 22 20  Temp: 97.6 F (36.4 C) 97.6 F (36.4 C) (!) 97.5 F (36.4 C) 98.3 F (36.8 C)  TempSrc: Axillary Axillary Oral Oral  SpO2: 99% 98% 98% 98%  Weight:   129 lb 6.4 oz (58.7 kg)   Height:        Intake/Output Summary (Last 24 hours) at 05/01/17  1639 Last data filed at 05/01/17 1423  Gross per 24 hour  Intake             1320 ml  Output             4527 ml  Net            -3207 ml   Filed Weights   04/29/17 0416 04/30/17 0426 05/01/17 0436  Weight: 128 lb 4.8 oz (58.2 kg) 132 lb 11.2 oz (60.2 kg) 129 lb 6.4 oz (58.7 kg)    Telemetry    sinsu  HR 55-65 - Personally Reviewed  ECG    No new EKG to review - Personally Reviewed  Physical Exam  Well developed and nourished in no acute distress HENT normal Neck supple with JVP->10 Carotids brisk and full without bruits D.iffuse crackekls Regular rate and rhythm, no murmurs or gallops Abd-soft with active BS without hepatomegaly No Clubbing cyanosis D). b 2-3+edema Skin-warm and dry A & Oriented  Grossly normal sensory and motor function   Labs    Chemistry  Recent Labs Lab  04/28/17 1725 04/29/17 0443 05/01/17 0308  NA 141 135 140  K 3.4* 3.9 3.5  CL 101 100* 103  CO2 _0 GLUCOSE 116* 165* 47*  BUN 40* 42* 35*  CREATININE 1.15 1.20 0.95  CALCIUM 9.0 8.3* 9.0  PROT 6.6 5.6* 5.9*  ALBUMIN 3.1* 2.8* 2.8*  AST 73* 71* 80*  ALT 39 38 48  ALKPHOS 88 86 116  BILITOT 2.3* 1.7* 1.0  GFRNONAA >60 59* >60  GFRAA >60 >60 >60  ANIONGAP _1 Hematology  Recent Labs Lab 04/28/17 1725 04/29/17 0443 05/01/17 0308  WBC 11.8* 10.0 10.2  RBC 4.36 4.65 4.66  HGB 13.0 14.0 13.9  HCT 40.0 43.3 43.4  MCV 91.7 93.1 93.1  MCH 29.8 30.1 29.8  MCHC 32.5 32.3 32.0  RDW 18.2* 18.4* 18.2*  PLT 197 220 251    Cardiac Enzymes  Recent Labs Lab 04/28/17 2303 04/29/17 0443 04/29/17 1018  TROPONINI 0.06* 0.07* 0.05*   No results for input(s): TROPIPOC in the last 168 hours.   BNP  Recent Labs Lab 04/28/17 1725  BNP 2,262.7*     DDimer   Recent Labs Lab 04/28/17 2303  DDIMER 0.90*     Radiology    No results found.  Cardiac Studies   2D echo 11/2016 Study Conclusions  - Left ventricle: The cavity size was normal. Systolic  function was   normal. The estimated ejection fraction was in the range of 55%   to 60%. Wall motion was normal; there were no regional wall   motion abnormalities. Doppler parameters are consistent with   abnormal left ventricular relaxation (grade 1 diastolic   dysfunction). - Ventricular septum: D-shaped interventricular septum suggestive   of RV pressure/volume overload. - Aortic valve: Trileaflet; moderately calcified leaflets. There   was mild stenosis. Mean gradient (S): 10 mm Hg. Valve area (VTI):   1.52 cm^2. - Mitral valve: There was no significant regurgitation. - Right ventricle: The cavity size was severely dilated. Systolic   function was moderately reduced. - Right atrium: The atrium was moderately dilated. - Tricuspid valve: Peak RV-RA gradient (S): 61 mm Hg. - Systemic veins: IVC not visualized. - Pericardium, extracardiac: A trivial pericardial effusion was   identified.  Impressions:  - Normal LV size with EF 55-60%. D-shaped interventricular septum   suggestive of RV pressure/volume overload. 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 Severe pulmonary   hypertension. Mild aortic stenosis.    Assessment & Plan    1. 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 When we send him home tomorrow we will give him an AliveCor monitor. In the event that atrial flutter is the rhythm, I would recommend catheter ablation if his atrial fibrillation and antiarrhythmic drugs would be the more appropriate therapy.\  Will chagne dilt to daily   2. Elevated troponin - 0.06 --> 0.07 --> 0.05 - this is likely related to demand ischemia in the presence of tachycardia -    3. Acute on chronic diastolic heart failure - BNP elevated at 2262 with LE  edema.  No edema on Cxray.  -Still volume overloaded.   improved diuresis pulmonary dose furosemide. We'll continue   4. CKD stage III - sCr 1.20, baseline  appears 1.1-1.2 - BMET pending today   Signed, Virl Axe, MD  05/01/2017, 4:39 PM

## 2017-05-02 LAB — GLUCOSE, CAPILLARY
GLUCOSE-CAPILLARY: 98 mg/dL (ref 65–99)
GLUCOSE-CAPILLARY: 284 mg/dL — AB (ref 65–99)
Glucose-Capillary: 57 mg/dL — ABNORMAL LOW (ref 65–99)
Glucose-Capillary: 136 mg/dL — ABNORMAL HIGH (ref 65–99)
Glucose-Capillary: 131 mg/dL — ABNORMAL HIGH (ref 65–99)

## 2017-05-02 LAB — BASIC METABOLIC PANEL
Anion gap: 7 (ref 5–15)
BUN: 32 mg/dL — AB (ref 6–20)
CO2: 30 mmol/L (ref 22–32)
Calcium: 8.8 mg/dL — ABNORMAL LOW (ref 8.9–10.3)
Chloride: 103 mmol/L (ref 101–111)
Creatinine, Ser: 0.86 mg/dL (ref 0.61–1.24)
GFR calc Af Amer: >60 mL/min (ref >60)
GLUCOSE: 46 mg/dL — AB (ref 65–99)
POTASSIUM: 3.1 mmol/L — AB (ref 3.5–5.1)
Sodium: 140 mmol/L (ref 135–145)

## 2017-05-02 LAB — ECHOCARDIOGRAM COMPLETE
HEIGHTINCHES: 63 in
Weight: 2070.4 oz

## 2017-05-02 MED ORDER — POTASSIUM CHLORIDE CRYS ER 20 MEQ PO TBCR: 40.0000 meq | EXTENDED_RELEASE_TABLET | Freq: Four times a day (QID) | ORAL | Status: DC

## 2017-05-02 MED ORDER — POTASSIUM CHLORIDE CRYS ER 20 MEQ PO TBCR
40.0000 meq | EXTENDED_RELEASE_TABLET | Freq: Four times a day (QID) | ORAL | Status: AC
  Administered 2017-05-02 – 2017-05-03 (×3): 40 meq via ORAL
  Filled 2017-05-02 (×3): qty 2

## 2017-05-02 MED ORDER — SALINE SPRAY 0.65 % NA SOLN
1.0000 | NASAL | Status: DC | PRN
  Filled 2017-05-02: qty 44

## 2017-05-02 MED ORDER — POTASSIUM CHLORIDE CRYS ER 20 MEQ PO TBCR
40.0000 meq | EXTENDED_RELEASE_TABLET | Freq: Once | ORAL | Status: AC
  Administered 2017-05-02: 40 meq via ORAL
  Filled 2017-05-02: qty 2

## 2017-05-02 NOTE — Progress Notes (Signed)
Fasting blood sugar this am after drinking orange juice. Pt awake and alert. More juice and breakfast given. Will cont to monitor. Carroll Kinds RN

## 2017-05-02 NOTE — Progress Notes (Signed)
PROGRESS NOTE  Gilbert Dominguez  ZYS:[id number] DOB: [birth year] DOA: 04/28/2017 PCP: Leonard Downing, MD  Outpatient Specialists: Cardiology, [facility] Pulmonology, [facility]  Brief Narrative: Gilbert Dominguez is a 71 y.o. male with a history of chronic hypoxic respiratory failure, severe pulmonary HTN and fibrosis, chronic diastolic CHF, stage III CKD, RA, and T2DM who was brought to the ED by EMS after he was found to be tachycardic when he called EMS for his wife who fell at home. He felt dizzy when rising too quickly and felt weak. Treatment with adenosine per cardiology recommendations for SVT caused temporary slowing of heart rate. He felt dyspneic with 2+ LE edema. ECG demonstrated atrial flutter with RVR, LPFB and RBBB. CTA chest showed no PE, but did show reflux into hepatic vein consistent with elevated right heart pressures. He was started on cardizem gtt and admitted with cardiology consultation. His weight on admission was 128lbs, up from dry weight of 123lbs. Eliquis was started and aspirin was subsequently stopped. IV lasix has been given. Troponin has been elevated mildly with flat trend and no chest pain. No ischemic work up is advised by cardiology.   Assessment & Plan: Principal Problem:   Atrial tachycardia (HCC) Active Problems:   CHF exacerbation (HCC)   Tachycardia   Hypokalemia   Hyperglycemia   Atrial flutter (HCC)   Pulmonary HTN (HCC)  SVT/atrial flutter with RVR: TSH 1.824.  - Diltiazem 126m daily, rate stable. Avoiding amiodarone with pulm fibrosis. No need for digoxin currently.  - Transfer to telemetry - Echocardiogram performed, read pending - CHA2DS2-VASc Scoreis 3, started on eliquis 537mBID. Monitor CBC.    Acute on chronic diastolic and right venticular CHF: Above EDW of 123lbs on admission. BNP 2,262.7.  - Strict I/O - Daily weights - Lasix 404mV BID is improving volume status, creatinine continues to improve. Will continue this pending  cardiology recommendations.  - LE U/S ordered for LE edema, no DVT  Chronic hypoxic respiratory failure due to pulmonary fibrosis and pulmonary HTN: 4L at rest, 8L on exertion at baseline, [facility] No current evidence of exacerbation.  - Continue home therapies  Demand ischemia: Minimal and flat troponin elevation in setting of rapid heart rate. No chest pain.  - No ischemic evaluation planned per cardiology  Hypokalemia - Replete again today, recheck with Md in AM - Monitor with diuresis  T2DM: Well-controlled with HbA1c 6.9%.  - Due to good control at home with glipizide and metformin, continuing this and stopping SSI.  - Continue to monitor CBGs given hypoglycemia this AM.   Stage III CKD: Scr at baseline of 1.2 on admission - Improving below putative baseline with diuresis. Continue monitoring.  - Minimize nephrotoxins  DVT prophylaxis: Eliquis Code Status: Full Family Communication: None at bedside [place] Plan[place] euv[place].  Consultants:   Cardiology   Procedures:   Echocardiogram 05/02/2017  Antimicrobials:  None   Subjective: Dyspnea remains above baseline, swelling not improved from yesterday. No chest pain, tells me he actually started smoking intermittently about 2 weeks ago after having quit since 1996. Nose feels congested, a relapsing problem for him. Low blood sugar this AM, no symptoms.  Objective: Vitals:   05/01/17 2356 05/02/17 0531 05/02/17 0600 05/02/17 0752  BP: 102/67 131/71  (!) 114/96  Pulse: 83 79  68  Resp: _0 Temp: (!) 97.1 F (36.2 C) (!) 97.1 F (36.2 C) 98.1 F (36.7 C) (!) 97.4 F (36.3  C)  TempSrc: Oral Oral Oral Oral  SpO2: 92% 90%  92%  Weight:  57.7 kg (127 lb 1.6 oz)    Height:        Intake/Output Summary (Last 24 hours) at 05/02/17 1055 Last data filed at 05/02/17 0921  Gross per 24 hour  Intake             1320 ml  Output             4025 ml  Net             -2705 ml   Filed Weights   04/30/17 0426 05/01/17 0436 05/02/17 0531  Weight: 60.2 kg (132 lb 11.2 oz) 58.7 kg (129 lb 6.4 oz) 57.7 kg (127 lb 1.6 oz)    Examination: General exam: Thin, energetic male in no distress sitting on side of the bed Respiratory system: Tachypneic on 4L by . Diffuse bilateral crackles in all lung zones, worse at bases. No wheezing.   Cardiovascular system: Irreg irreg with rate in 70's. No murmur, rub, or gallop. No JVD, and 2+ pitting LE edema, unchanged. Gastrointestinal system: Abdomen soft, non-tender, non-distended, with normoactive bowel sounds. No organomegaly or masses felt. Central nervous system: Alert and oriented. No focal neurological deficits. Extremities: Warm, no deformities Skin: No rashes, lesions no ulcers. Senile purpura. Psychiatry: Judgement and insight appear normal. Mood & affect appropriate.   Data Reviewed: I have personally reviewed following labs and imaging studies  CBC:  Recent Labs Lab 04/28/17 1725 04/29/17 0443 05/01/17 0308  WBC 11.8* 10.0 10.2  NEUTROABS 11.1*  --   --   HGB 13.0 14.0 13.9  HCT 40.0 43.3 43.4  MCV 91.7 93.1 93.1  PLT 197 220 950   Basic Metabolic Panel:  Recent Labs Lab 04/28/17 1725 04/29/17 0443 05/01/17 0308 05/02/17 0307  NA 141 135 140 140  K 3.4* 3.9 3.5 3.1*  CL 101 100* 103 103  CO2 _0 GLUCOSE 116* 165* 47* 46*  BUN 40* 42* 35* 32*  CREATININE 1.15 1.20 0.95 0.86  CALCIUM 9.0 8.3* 9.0 8.8*  MG  --  1.9 2.0  --   PHOS  --  3.5  --   --    GFR: Estimated Creatinine Clearance: 62.5 mL/min (by C-G formula based on SCr of 0.86 mg/dL). Liver Function Tests:  Recent Labs Lab 04/28/17 1725 04/29/17 0443 05/01/17 0308  AST 73* 71* 80*  ALT 39 38 48  ALKPHOS 88 86 116  BILITOT 2.3* 1.7* 1.0  PROT 6.6 5.6* 5.9*  ALBUMIN 3.1* 2.8* 2.8*   No results for input(s): LIPASE, AMYLASE in the last 168 hours. No results for input(s): AMMONIA in the last 168  hours. Coagulation Profile:  Recent Labs Lab 04/28/17 1725  INR 1.33   Cardiac Enzymes:  Recent Labs Lab 04/28/17 2303 04/29/17 0443 04/29/17 1018  TROPONINI 0.06* 0.07* 0.05*   BNP (last 3 results)  Recent Labs  01/20/17 1642  PROBNP 1,867.0*   HbA1C: No results for input(s): HGBA1C in the last 72 hours. CBG:  Recent Labs Lab 05/01/17 1128 05/01/17 1637 05/01/17 2130 05/02/17 0751 05/02/17 0855  GLUCAP 122* 167* 211* 57* 136*   Lipid Profile: No results for input(s): CHOL, HDL, LDLCALC, TRIG, CHOLHDL, LDLDIRECT in the last 72 hours. Thyroid Function Tests: No results for input(s): TSH, T4TOTAL, FREET4, T3FREE, THYROIDAB in the last 72 hours. Anemia Panel: No results for input(s): VITAMINB12, FOLATE, FERRITIN, TIBC, IRON, RETICCTPCT in the last  72 hours. Urine analysis:    Component Value Date/Time   COLORURINE COLORLESS (A) 12/08/2016 0604   APPEARANCEUR CLEAR 12/08/2016 0604   LABSPEC 1.006 12/08/2016 0604   PHURINE 6.0 12/08/2016 0604   GLUCOSEU NEGATIVE 12/08/2016 0604   HGBUR NEGATIVE 12/08/2016 0604   BILIRUBINUR NEGATIVE 12/08/2016 0604   KETONESUR NEGATIVE 12/08/2016 0604   PROTEINUR NEGATIVE 12/08/2016 0604   NITRITE NEGATIVE 12/08/2016 0604   LEUKOCYTESUR NEGATIVE 12/08/2016 0604   Recent Results (from the past 240 hour(s))  MRSA PCR Screening     Status: None   Collection Time: 04/28/17 11:02 PM  Result Value Ref Range Status   MRSA by PCR NEGATIVE NEGATIVE Final    Comment:        The GeneXpert MRSA Assay (FDA approved for NASAL specimens only), is one component of a comprehensive MRSA colonization surveillance program. It is not intended to diagnose MRSA infection nor to guide or monitor treatment for MRSA infections.       Radiology Studies: No results found.  Scheduled Meds: . apixaban  5 mg Oral BID  . atorvastatin  20 mg Oral Daily  . dextromethorphan-guaiFENesin  1 tablet Oral BID  . diltiazem  120 mg Oral Daily  .  famotidine  20 mg Oral QHS  . feeding supplement (PRO-STAT SUGAR FREE 64)  30 mL Oral BID  . furosemide  80 mg Intravenous BID  . metFORMIN  500 mg Oral BID WC   And  . glipiZIDE  2.5 mg Oral BID WC  . loratadine  10 mg Oral Daily  . pantoprazole  40 mg Oral Daily  . potassium chloride SA  20 mEq Oral Daily  . predniSONE  20 mg Oral Q breakfast  . sodium chloride flush  3 mL Intravenous Q12H  . sodium chloride flush  3 mL Intravenous Q12H   Continuous Infusions:   LOS: 4 days   Time spent: 25 minutes.  Vance Gather, MD [facility] Pager [phone]  If 7PM-7AM, please contact night-coverage [email or web address] Password [facility] 05/02/2017, 10:55 AM

## 2017-05-02 NOTE — Progress Notes (Signed)
Progress Note  Patient Name: Gilbert Dominguez Date of Encounter: 05/02/2017  Primary Cardiologist: Dr. Debara Pickett   Patient Profile     71 y.o. male  with a history of severe pulmonary fibrosis on home O2, severe pulmonary HTN, RA, remote tobacco use and CKD stage III.  He noticed worsening SOB and says that he was worried about his wife who had fallen and got up too fast and felt very dizzy.  EMS came to assess his wife and he stated that he was weak and upon evaluation was found to be in SVT and was given adenosine which slowed the HR but then HR went back up and noted to be atrial flutter with RVR.     Subjective  brathing better but not at baseline Still with edema     he thinks dyspnea has  2-3 months getting wrose  Inpatient Medications    Scheduled Meds: . apixaban  5 mg Oral BID  . atorvastatin  20 mg Oral Daily  . dextromethorphan-guaiFENesin  1 tablet Oral BID  . diltiazem  120 mg Oral Daily  . famotidine  20 mg Oral QHS  . feeding supplement (PRO-STAT SUGAR FREE 64)  30 mL Oral BID  . furosemide  80 mg Intravenous BID  . metFORMIN  500 mg Oral BID WC   And  . glipiZIDE  2.5 mg Oral BID WC  . loratadine  10 mg Oral Daily  . pantoprazole  40 mg Oral Daily  . potassium chloride SA  40 mEq Oral Once  . predniSONE  20 mg Oral Q breakfast  . sodium chloride flush  3 mL Intravenous Q12H  . sodium chloride flush  3 mL Intravenous Q12H   Continuous Infusions:  PRN Meds:    Vital Signs    Vitals:   05/02/17 0531 05/02/17 0600 05/02/17 0752 05/02/17 1117  BP: 131/71  (!) 114/96 117/81  Pulse: 79  68 75  Resp: 16  17 (!) 22  Temp: (!) 97.1 F (36.2 C) 98.1 F (36.7 C) (!) 97.4 F (36.3 C) 98.3 F (36.8 C)  TempSrc: Oral Oral Oral Oral  SpO2: 90%  92% 95%  Weight: 127 lb 1.6 oz (57.7 kg)     Height:        Intake/Output Summary (Last 24 hours) at 05/02/17 1158 Last data filed at 05/02/17 0921  Gross per 24 hour  Intake             1320 ml  Output              3275 ml  Net            -1955 ml   Filed Weights   04/30/17 0426 05/01/17 0436 05/02/17 0531  Weight: 132 lb 11.2 oz (60.2 kg) 129 lb 6.4 oz (58.7 kg) 127 lb 1.6 oz (57.7 kg)    Telemetry    SInus with some PAC Personally Reviewed  ECG    No new EKG to review - Personally Reviewed  Physical Exam  Well developed and nourished in no acute distress HENT normal Neck supple with JVP 8-10 Carotids brisk and full without bruits Diffuse crackles Regular rate and rhythm, no murmurs or gallops Abd-soft with active BS without hepatomegaly No Clubbing cyanosis 2-3+edema Skin-warm and dry A & Oriented  Grossly normal sensory and motor function   Labs    Chemistry  Recent Labs Lab 04/28/17 1725 04/29/17 0443 05/01/17 0308 05/02/17 0307  NA 141 135 140 140  K 3.4* 3.9 3.5 3.1*  CL 101 100* 103 103  CO2 _0 GLUCOSE 116* 165* 47* 46*  BUN 40* 42* 35* 32*  CREATININE 1.15 1.20 0.95 0.86  CALCIUM 9.0 8.3* 9.0 8.8*  PROT 6.6 5.6* 5.9*  --   ALBUMIN 3.1* 2.8* 2.8*  --   AST 73* 71* 80*  --   ALT 39 38 48  --   ALKPHOS 88 86 116  --   BILITOT 2.3* 1.7* 1.0  --   GFRNONAA >60 59* >60 >60  GFRAA >60 >60 >60 >60  ANIONGAP _1 Hematology  Recent Labs Lab 04/28/17 1725 04/29/17 0443 05/01/17 0308  WBC 11.8* 10.0 10.2  RBC 4.36 4.65 4.66  HGB 13.0 14.0 13.9  HCT 40.0 43.3 43.4  MCV 91.7 93.1 93.1  MCH 29.8 30.1 29.8  MCHC 32.5 32.3 32.0  RDW 18.2* 18.4* 18.2*  PLT 197 220 251    Cardiac Enzymes  Recent Labs Lab 04/28/17 2303 04/29/17 0443 04/29/17 1018  TROPONINI 0.06* 0.07* 0.05*   No results for input(s): TROPIPOC in the last 168 hours.   BNP  Recent Labs Lab 04/28/17 1725  BNP 2,262.7*     DDimer   Recent Labs Lab 04/28/17 2303  DDIMER 0.90*     Radiology    No results found.  Cardiac Studies   2D echo 11/2016 Study Conclusions  - Left ventricle: The cavity size was normal. Systolic function was   normal. The  estimated ejection fraction was in the range of 55%   to 60%. Wall motion was normal; there were no regional wall   motion abnormalities. Doppler parameters are consistent with   abnormal left ventricular relaxation (grade 1 diastolic   dysfunction). - Ventricular septum: D-shaped interventricular septum suggestive   of RV pressure/volume overload. - Aortic valve: Trileaflet; moderately calcified leaflets. There   was mild stenosis. Mean gradient (S): 10 mm Hg. Valve area (VTI):   1.52 cm^2. - Mitral valve: There was no significant regurgitation. - Right ventricle: The cavity size was severely dilated. Systolic   function was moderately reduced. - Right atrium: The atrium was moderately dilated. - Tricuspid valve: Peak RV-RA gradient (S): 61 mm Hg. - Systemic veins: IVC not visualized. - Pericardium, extracardiac: A trivial pericardial effusion was   identified.  Impressions:  - Normal LV size with EF 55-60%. D-shaped interventricular septum   suggestive of RV pressure/volume overload. 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 Severe pulmonary   hypertension. Mild aortic stenosis.    Assessment & Plan    1. New onset Atrial flutter with RVR - converted overnight to NSR with PVCs and PACs and appears to be multifocal atrial rhythm now - CHADS2VASC score is 3 and started on Eliquis 39m BID  `He has had recurrent atrial flutter/fibrillation in the past by history. When we send him home tomorrow we will give him an AliveCor monitor. In the event that atrial flutter is the rhythm, I would recommend catheter ablation if his atrial fibrillation and antiarrhythmic drugs would be the more appropriate therapy.\   2. Elevated troponin  3. Acute on chronic diastolic heart failure   4. CKD stage III  Hypokalemia new  Continue vigorous diuresis, still very volume overloaded Continue anticoagulation     Signed, SVirl Axe MD  05/02/2017, 11:58 AM

## 2017-05-03 LAB — BASIC METABOLIC PANEL
ANION GAP: 6 (ref 5–15)
BUN: 25 mg/dL — ABNORMAL HIGH (ref 6–20)
CALCIUM: 8.4 mg/dL — AB (ref 8.9–10.3)
CO2: 33 mmol/L — AB (ref 22–32)
CREATININE: 0.87 mg/dL (ref 0.61–1.24)
Chloride: 99 mmol/L — ABNORMAL LOW (ref 101–111)
Glucose, Bld: 67 mg/dL (ref 65–99)
Potassium: 3.5 mmol/L (ref 3.5–5.1)
Sodium: 138 mmol/L (ref 135–145)

## 2017-05-03 LAB — GLUCOSE, CAPILLARY
GLUCOSE-CAPILLARY: 122 mg/dL — AB (ref 65–99)
GLUCOSE-CAPILLARY: 222 mg/dL — AB (ref 65–99)
Glucose-Capillary: 254 mg/dL — ABNORMAL HIGH (ref 65–99)
Glucose-Capillary: 138 mg/dL — ABNORMAL HIGH (ref 65–99)

## 2017-05-03 LAB — CBC
HCT: 40.8 % (ref 39.0–52.0)
HEMOGLOBIN: 12.8 g/dL — AB (ref 13.0–17.0)
MCH: 29.4 pg (ref 26.0–34.0)
MCHC: 31.4 g/dL (ref 30.0–36.0)
MCV: 93.8 fL (ref 78.0–100.0)
PLATELETS: 255 10*3/uL (ref 150–400)
RBC: 4.35 MIL/uL (ref 4.22–5.81)
RDW: 18.1 % — ABNORMAL HIGH (ref 11.5–15.5)
WBC: 10.9 10*3/uL — ABNORMAL HIGH (ref 4.0–10.5)

## 2017-05-03 LAB — MAGNESIUM: MAGNESIUM: 2.1 mg/dL (ref 1.7–2.4)

## 2017-05-03 MED ORDER — POTASSIUM CHLORIDE CRYS ER 20 MEQ PO TBCR
40.0000 meq | EXTENDED_RELEASE_TABLET | Freq: Once | ORAL | Status: AC
  Administered 2017-05-03: 40 meq via ORAL
  Filled 2017-05-03: qty 2

## 2017-05-03 NOTE — Progress Notes (Signed)
Progress Note  Patient Name: Gilbert Dominguez Date of Encounter: 05/03/2017  Primary Cardiologist: Dr. Debara Pickett  Subjective   Initial O2 saturations at 68% but he had his nasal cannula off --> improved to 92% upon being placed on his baseline of 4L [place]. Breathing has improved but still not at baseline. He denies any chest pain or recurrent palpitations. Still with significant lower extremity edema.   Inpatient Medications    Scheduled Meds: . apixaban  5 mg Oral BID  . atorvastatin  20 mg Oral Daily  . dextromethorphan-guaiFENesin  1 tablet Oral BID  . diltiazem  120 mg Oral Daily  . famotidine  20 mg Oral QHS  . feeding supplement (PRO-STAT SUGAR FREE 64)  30 mL Oral BID  . furosemide  80 mg Intravenous BID  . metFORMIN  500 mg Oral BID WC   And  . glipiZIDE  2.5 mg Oral BID WC  . loratadine  10 mg Oral Daily  . pantoprazole  40 mg Oral Daily  . predniSONE  20 mg Oral Q breakfast  . sodium chloride flush  3 mL Intravenous Q12H  . sodium chloride flush  3 mL Intravenous Q12H   Continuous Infusions:  PRN Meds: sodium chloride   Vital Signs    Vitals:   05/03/17 0500 05/03/17 0551 05/03/17 0618 05/03/17 0700  BP:  119/88 119/88   Pulse:   76 67  Resp:   (!) 22 13  Temp:   97.6 F (36.4 C)   TempSrc:   Axillary   SpO2:   (!) 87% 95%  Weight: 126 lb (57.2 kg)     Height:        Intake/Output Summary (Last 24 hours) at 05/03/17 0849 Last data filed at 05/03/17 0558  Gross per 24 hour  Intake             1320 ml  Output             5550 ml  Net            -4230 ml   Filed Weights   05/01/17 0436 05/02/17 0531 05/03/17 0500  Weight: 129 lb 6.4 oz (58.7 kg) 127 lb 1.6 oz (57.7 kg) 126 lb (57.2 kg)    Telemetry    NSR, HR in 60's - 80's. Significant artifact on tracings this AM.  - Personally Reviewed  ECG    No new tracings.   Physical Exam   General: Well developed, Caucasian male appearing in no acute distress. Head: Normocephalic,  atraumatic.  Neck: Supple without bruits, JVD at 9cm. Lungs:  Resp regular and unlabored, rales at bases bilaterally with diffuse crackles throughout. Heart: RRR, S1, S2, no S3, S4, or murmur; no rub. Abdomen: Soft, non-tender, non-distended with normoactive bowel sounds. No hepatomegaly. No rebound/guarding. No obvious abdominal masses. Extremities: No clubbing or cyanosis. 2+ pitting edema up to knees bilaterally. Distal pedal pulses are 2+ bilaterally. Neuro: Alert and oriented X 3. Moves all extremities spontaneously. Psych: Normal affect.  Labs    Chemistry Recent Labs Lab 04/28/17 1725 04/29/17 0443 05/01/17 0308 05/02/17 0307 05/03/17 0305  NA 141 135 140 140 138  K 3.4* 3.9 3.5 3.1* 3.5  CL 101 100* 103 103 99*  CO2 _0 33*  GLUCOSE 116* 165* 47* 46* 67  BUN 40* 42* 35* 32* 25*  CREATININE 1.15 1.20 0.95 0.86 0.87  CALCIUM 9.0 8.3* 9.0 8.8* 8.4*  PROT 6.6 5.6* 5.9*  --   --  ALBUMIN 3.1* 2.8* 2.8*  --   --   AST 73* 71* 80*  --   --   ALT 39 38 48  --   --   ALKPHOS 88 86 116  --   --   BILITOT 2.3* 1.7* 1.0  --   --   GFRNONAA >60 59* >60 >60 >60  GFRAA >60 >60 >60 >60 >60  ANIONGAP _0 Hematology Recent Labs Lab 04/29/17 0443 05/01/17 0308 05/03/17 0305  WBC 10.0 10.2 10.9*  RBC 4.65 4.66 4.35  HGB 14.0 13.9 12.8*  HCT 43.3 43.4 40.8  MCV 93.1 93.1 93.8  MCH 30.1 29.8 29.4  MCHC 32.3 32.0 31.4  RDW 18.4* 18.2* 18.1*  PLT 220 251 255    Cardiac Enzymes Recent Labs Lab 04/28/17 2303 04/29/17 0443 04/29/17 1018  TROPONINI 0.06* 0.07* 0.05*   No results for input(s): TROPIPOC in the last 168 hours.   BNP Recent Labs Lab 04/28/17 1725  BNP 2,262.7*     DDimer  Recent Labs Lab 04/28/17 2303  DDIMER 0.90*     Radiology    No results found.  Cardiac Studies   Echocardiogram: 05/01/2017 Study Conclusions  - Left ventricle: The cavity size was mildly reduced. Systolic   function was normal. The estimated  ejection fraction was in the   range of 55% to 60%. Wall motion was normal; there were no   regional wall motion abnormalities. Doppler parameters are   consistent with abnormal left ventricular relaxation (grade 1   diastolic dysfunction). - Ventricular septum: The contour showed diastolic flattening and   systolic flattening. These changes are consistent with RV volume   and pressure overload. - Aortic valve: There was very mild stenosis. Valve area (VTI):   1.85 cm^2. Valve area (Vmax): 1.61 cm^2. Valve area (Vmean): 1.5   cm^2. - Right ventricle: The cavity size was severely dilated. Systolic   function was moderately to severely reduced. - Right atrium: The atrium was severely dilated. - Tricuspid valve: There was moderate-severe regurgitation directed   eccentrically. - Pulmonary arteries: Systolic pressure was severely increased. PA   peak pressure: 87 mm Hg (S).  Patient Profile     71 y.o. male w/ PMH of chronic respiratory failure (on home O2), pulmonary fibrosis, pulmonary HTN, Type 2 DM,  former tobacco use, and Stage 3 CKD who presented to [facility] ED on 7/11 with progressive dyspnea and weakness. Cardiology consulted for atrial flutter with RVR.   Assessment & Plan    1. New onset Atrial flutter with RVR - initially in what was thought to be SVT --> Adenosine was administered and slowed HR and showed atrial flutter with RVR. Converted on 7/12 to NSR.  - This patients CHA2DS2-VASc Score and unadjusted Ischemic Stroke Rate (% per year) is equal to 3.2 % stroke rate/year from a score of 3 (HTN, DM, Age). He has been started on Eliquis 3m BID for anticoagulation. - initially on IV Cardizem but this has been switched to Cardizem CD [id number]. Dr. KlCaryl Comesecommended an AliveCor monitor and consideration of catheter ablation if he has recurrent episodes.   2. Elevated troponin - 0.06 --> 0.07 --> 0.05 - likely related to demand ischemia in the presence of tachycardia. Echo  shows a preserved EF with no WMA. He denies any recent chest pain. No plans for further ischemic evaluation at this time.   3. Acute on chronic diastolic heart failure - BNP  elevated to 2262 on admission. - echo shows a preserved EF of 55-60% with Grade 1 DD and no WMA.  - has been diuresing with IV Lasix 37m BID (on 862mAM/4042mM prior to admission). He is overall -9.6 L (-4.2L yesterday). Still remains significantly volume overloaded. Continue with aggressive diuresis as creatinine remains stable. Will likely require 2-3 days of additional diuresis.   4. Pulmonary HTN - PA pressure at 87 mm Hg on echo this admission.   5. CKD stage III - baseline creatinine 1.1-1.2 - creatinine stable at 0.87 this AM.   6. Chronic Hypoxic Respiratory Failure - on 4L- 8L at baseline.  - per admitting team.   Signed, BriErma HeritagePA-C 8:49 AM 05/03/2017 Pager: [phone]

## 2017-05-03 NOTE — Progress Notes (Signed)
Inpatient Diabetes Program Recommendations  AACE/ADA: New Consensus Statement on Inpatient Glycemic Control (2015)  Target Ranges:  Prepandial:   less than 140 mg/dL      Peak postprandial:   less than 180 mg/dL (1-2 hours)      Critically ill patients:  140 - 180 mg/dL  Results for Gilbert Dominguez, Gilbert Dominguez (MRN [id number]) as of 05/03/2017 14:09  Ref. Range 05/02/2017 07:51 05/02/2017 08:55 05/02/2017 11:16 05/02/2017 16:53 05/02/2017 22:06 05/03/2017 07:48 05/03/2017 11:35  Glucose-Capillary Latest Ref Range: 65 - 99 mg/dL 57 (L) 136 (H) 98 284 (H) 131 (H) 122 (H) 222 (H)  Results for Gilbert Dominguez, Gilbert Dominguez (MRN [id number]) as of 05/03/2017 14:09  Ref. Range 05/01/2017 07:40 05/01/2017 11:28 05/01/2017 16:37 05/01/2017 21:30  Glucose-Capillary Latest Ref Range: 65 - 99 mg/dL 59 (L) 122 (H) 167 (H) 211 (H)   Results for Gilbert Dominguez, Gilbert Dominguez (MRN [id number]) as of 05/03/2017 14:09  Ref. Range 03/02/2017 05:57 04/28/2017 23:03  Hemoglobin A1C Latest Ref Range: 4.8 - 5.6 % 9.9 (H) 6.9 (H)   Review of Glycemic Control  Diabetes history: DM2 Outpatient Diabetes medications: Metaglip 2.5-500 mg BID Current orders for Inpatient glycemic control: Glipizide 2.5 mg BID, Metformin 500 mg BID  Inpatient Diabetes Program Recommendations: Correction (SSI): Please consider ordering CBGs with Novolog correction scale ACHS. Oral Agents: While inpatient, please consider discontinuing Glipizide and Metformin.  Thanks, Barnie Alderman, RN, MSN, CDE Diabetes Coordinator Inpatient Diabetes Program [phone] (Team Pager from 8am to 5pm)

## 2017-05-03 NOTE — Progress Notes (Signed)
PROGRESS NOTE  Gilbert Dominguez  FOY:[id number] DOB: 11-Apr-1946 DOA: 04/28/2017 PCP: Leonard Downing, MD  Outpatient Specialists: Cardiology, [facility]monology, [facility]  Brief Narrative: Gilbert Dominguez is a 71 y.o. male with a history of chronic hypoxic respiratory failure, severe pulmonary HTN and fibrosis, chronic diastolic CHF, stage III CKD, RA, and T2DM who was brought to the ED by EMS after he was found to be tachycardic when he called EMS for his wife who fell at home. He felt dizzy when rising too quickly and felt weak. Treatment with adenosine per cardiology recommendations for SVT caused temporary slowing of heart rate. He felt dyspneic with 2+ LE edema. ECG demonstrated atrial flutter with RVR, LPFB and RBBB. CTA chest showed no PE, but did show reflux into hepatic vein consistent with elevated right heart pressures. He was started on cardizem gtt and admitted with cardiology consultation. His weight on admission was 128lbs, up from dry weight of 123lbs. Eliquis was started and aspirin was subsequently stopped. IV lasix has been given. Troponin has been elevated mildly with flat trend and no chest pain. No ischemic work up is advised by cardiology. Diuresis has been ongoing, creatinine stable, still overloaded.   Assessment & Plan: Principal Problem:   Atrial tachycardia (HCC) Active Problems:   CHF exacerbation (HCC)   Tachycardia   Hypokalemia   Hyperglycemia   Atrial flutter (HCC)   Pulmonary HTN (HCC)  SVT/atrial flutter with RVR: TSH 1.824.  - Diltiazem 160m daily, rate stable. Avoiding amiodarone with pulm fibrosis. No need for digoxin currently.  - CHA2DS2-VASc Scoreis 3, started on eliquis 51mBID. Monitor CBC.   - Plan for AliveCor monitoring as outpatient with consideration of catheter ablation if recurrent episodes.  Acute on chronic diastolic and right venticular CHF: Above EDW of 123lbs on admission. BNP 2,262.7. Echo confirms RV systolic  dysfunction, preserved LVEF, grade 1 diastolic CHF. PA peak pressure 8757m.  - Strict I/O - Daily weights - Lasix 4m14m BID is improving volume status, creatinine stable. Will continue this. - LE U/S ordered for LE edema, no DVT  Chronic hypoxic respiratory failure due to pulmonary fibrosis and pulmonary HTN: 4L at rest, 8L on exertion at baseline, [facility]. No current evidence of exacerbation.  - Continue home therapies including 4L supplemental O2 - Continuing prednisone 20mg36mly until follow up with Dr. [facility] Melvyn Novas   Demand ischemia: Minimal and flat troponin elevation in setting of rapid heart rate. No chest pain.  - No ischemic evaluation planned per cardiology  Hypokalemia - Replete again today for level of 3.5, Mg wnl.  - Monitor with diuresis  T2DM: Well-controlled with HbA1c 6.9%.  - Due to good control at home with glipizide and metformin, continuing this and stopping SSI.  - Continue to monitor CBGs given hypoglycemia this AM.   Stage III CKD: Scr at baseline of 1.2 on admission - Improving below putative baseline with diuresis. Continue monitoring.  - Minimize nephrotoxins  Tobacco use: Restarted smoking 2 weeks ago after 37 years abstinence.  - Strongly emphasized need for cessation. Not smoking enough to require nicotine patch.   DVT prophylaxis: Eliquis Code Status: Full Family Communication: None at bedside Disposition Plan: AnticGrenelefe once euvolemic.  Consultants:   Cardiology   Procedures:   Echocardiogram 05/02/2017: - Left ventricle: The cavity size was mildly reduced. Systolic   function was normal. The estimated ejection fraction was in the   range of 55% to 60%. Wall motion was normal;  there were no   regional wall motion abnormalities. Doppler parameters are   consistent with abnormal left ventricular relaxation (grade 1   diastolic dysfunction). - Ventricular septum: The contour showed diastolic flattening  and   systolic flattening. These changes are consistent with RV volume   and pressure overload. - Aortic valve: There was very mild stenosis. Valve area (VTI):   1.85 cm^2. Valve area (Vmax): 1.61 cm^2. Valve area (Vmean): 1.5   cm^2. - Right ventricle: The cavity size was severely dilated. Systolic   function was moderately to severely reduced. - Right atrium: The atrium was severely dilated. - Tricuspid valve: There was moderate-severe regurgitation directed   eccentrically. - Pulmonary arteries: Systolic pressure was severely increased. PA   peak pressure: 87 mm Hg (S).  Antimicrobials:  None   Subjective: Had desaturations into 60's this morning, usually doesn't get that low when he's off nasal cannula transiently at home. Denies chest pain, no significant improvement in leg swelling.    Objective: Vitals:   05/03/17 0551 05/03/17 0618 05/03/17 0700 05/03/17 1141  BP: 119/88 119/88  104/76  Pulse:  76 67 69  Resp:  (!) _0 Temp:  97.6 F (36.4 C)  98.1 F (36.7 C)  TempSrc:  Axillary  Oral  SpO2:  (!) 87% 95% 97%  Weight:      Height:        Intake/Output Summary (Last 24 hours) at 05/03/17 1247 Last data filed at 05/03/17 1141  Gross per 24 hour  Intake              600 ml  Output             6550 ml  Net            -5950 ml   Filed Weights   05/01/17 0436 05/02/17 0531 05/03/17 0500  Weight: 58.7 kg (129 lb 6.4 oz) 57.7 kg (127 lb 1.6 oz) 57.2 kg (126 lb)    Examination: General exam: Thin, energetic male in no distress sitting on side of the bed Respiratory system: Tachypneic on 4L by . Velcro-like crackles in all lung zones, worse at bases. No wheezing.   Cardiovascular system: Irreg irreg with rate in 60's. No murmur, rub, or gallop. No JVD, and deeply pitting LE edema, unchanged. Gastrointestinal system: Abdomen soft, non-tender, non-distended, with normoactive bowel sounds. No organomegaly or masses felt. Central nervous system: Alert and  oriented. No focal neurological deficits. Extremities: Warm, no deformities Skin: No rashes, lesions no ulcers. Senile purpura. Psychiatry: Judgement and insight appear normal. Mood & affect appropriate.   Data Reviewed: I have personally reviewed following labs and imaging studies  CBC:  Recent Labs Lab 04/28/17 1725 04/29/17 0443 05/01/17 0308 05/03/17 0305  WBC 11.8* 10.0 10.2 10.9*  NEUTROABS 11.1*  --   --   --   HGB 13.0 14.0 13.9 12.8*  HCT 40.0 43.3 43.4 40.8  MCV 91.7 93.1 93.1 93.8  PLT 197 220 251 834   Basic Metabolic Panel:  Recent Labs Lab 04/28/17 1725 04/29/17 0443 05/01/17 0308 05/02/17 0307 05/03/17 0305  NA 141 135 140 140 138  K 3.4* 3.9 3.5 3.1* 3.5  CL 101 100* 103 103 99*  CO2 _1 33*  GLUCOSE 116* 165* 47* 46* 67  BUN 40* 42* 35* 32* 25*  CREATININE 1.15 1.20 0.95 0.86 0.87  CALCIUM 9.0 8.3* 9.0 8.8* 8.4*  MG  --  1.9 2.0  --  2.1  PHOS  --  3.5  --   --   --    GFR: Estimated Creatinine Clearance: 61.8 mL/min (by C-G formula based on SCr of 0.87 mg/dL). Liver Function Tests:  Recent Labs Lab 04/28/17 1725 04/29/17 0443 05/01/17 0308  AST 73* 71* 80*  ALT 39 38 48  ALKPHOS 88 86 116  BILITOT 2.3* 1.7* 1.0  PROT 6.6 5.6* 5.9*  ALBUMIN 3.1* 2.8* 2.8*   No results for input(s): LIPASE, AMYLASE in the last 168 hours. No results for input(s): AMMONIA in the last 168 hours. Coagulation Profile:  Recent Labs Lab 04/28/17 1725  INR 1.33   Cardiac Enzymes:  Recent Labs Lab 04/28/17 2303 04/29/17 0443 04/29/17 1018  TROPONINI 0.06* 0.07* 0.05*   BNP (last 3 results)  Recent Labs  01/20/17 1642  PROBNP 1,867.0*   HbA1C: No results for input(s): HGBA1C in the last 72 hours. CBG:  Recent Labs Lab 05/02/17 1116 05/02/17 1653 05/02/17 2206 05/03/17 0748 05/03/17 1135  GLUCAP 98 284* 131* 122* 222*   Lipid Profile: No results for input(s): CHOL, HDL, LDLCALC, TRIG, CHOLHDL, LDLDIRECT in the last 72  hours. Thyroid Function Tests: No results for input(s): TSH, T4TOTAL, FREET4, T3FREE, THYROIDAB in the last 72 hours. Anemia Panel: No results for input(s): VITAMINB12, FOLATE, FERRITIN, TIBC, IRON, RETICCTPCT in the last 72 hours. Urine analysis:    Component Value Date/Time   COLORURINE COLORLESS (A) 12/08/2016 0604   APPEARANCEUR CLEAR 12/08/2016 0604   LABSPEC 1.006 12/08/2016 0604   PHURINE 6.0 12/08/2016 0604   GLUCOSEU NEGATIVE 12/08/2016 0604   HGBUR NEGATIVE 12/08/2016 0604   BILIRUBINUR NEGATIVE 12/08/2016 0604   KETONESUR NEGATIVE 12/08/2016 0604   PROTEINUR NEGATIVE 12/08/2016 0604   NITRITE NEGATIVE 12/08/2016 0604   LEUKOCYTESUR NEGATIVE 12/08/2016 0604   Recent Results (from the past 240 hour(s))  MRSA PCR Screening     Status: None   Collection Time: 04/28/17 11:02 PM  Result Value Ref Range Status   MRSA by PCR NEGATIVE NEGATIVE Final    Comment:        The GeneXpert MRSA Assay (FDA approved for NASAL specimens only), is one component of a comprehensive MRSA colonization surveillance program. It is not intended to diagnose MRSA infection nor to guide or monitor treatment for MRSA infections.       Radiology Studies: No results found.  Scheduled Meds: . apixaban  5 mg Oral BID  . atorvastatin  20 mg Oral Daily  . dextromethorphan-guaiFENesin  1 tablet Oral BID  . diltiazem  120 mg Oral Daily  . famotidine  20 mg Oral QHS  . feeding supplement (PRO-STAT SUGAR FREE 64)  30 mL Oral BID  . furosemide  80 mg Intravenous BID  . metFORMIN  500 mg Oral BID WC   And  . glipiZIDE  2.5 mg Oral BID WC  . loratadine  10 mg Oral Daily  . pantoprazole  40 mg Oral Daily  . potassium chloride  40 mEq Oral Once  . predniSONE  20 mg Oral Q breakfast  . sodium chloride flush  3 mL Intravenous Q12H  . sodium chloride flush  3 mL Intravenous Q12H   Continuous Infusions:   LOS: 5 days   Time spent: 25 minutes.  Vance Gather, MD [facility] Pager  [phone]  If 7PM-7AM, please contact night-coverage [email or web address] Password [facility] 05/03/2017, 12:47 PM

## 2017-05-04 ENCOUNTER — Ambulatory Visit: Payer: Medicare Other | Admitting: Internal Medicine

## 2017-05-04 DIAGNOSIS — I50813 Acute on chronic right heart failure: Secondary | ICD-10-CM

## 2017-05-04 LAB — BASIC METABOLIC PANEL
Anion gap: 5 (ref 5–15)
BUN: 30 mg/dL — AB (ref 6–20)
CALCIUM: 9 mg/dL (ref 8.9–10.3)
CO2: 31 mmol/L (ref 22–32)
CREATININE: 0.89 mg/dL (ref 0.61–1.24)
Chloride: 100 mmol/L — ABNORMAL LOW (ref 101–111)
GFR calc Af Amer: >60 mL/min (ref >60)
GLUCOSE: 84 mg/dL (ref 65–99)
Potassium: 4.5 mmol/L (ref 3.5–5.1)
SODIUM: 136 mmol/L (ref 135–145)

## 2017-05-04 LAB — GLUCOSE, CAPILLARY
Glucose-Capillary: 110 mg/dL — ABNORMAL HIGH (ref 65–99)
Glucose-Capillary: 153 mg/dL — ABNORMAL HIGH (ref 65–99)
Glucose-Capillary: 183 mg/dL — ABNORMAL HIGH (ref 65–99)
Glucose-Capillary: 84 mg/dL (ref 65–99)
Glucose-Capillary: 88 mg/dL (ref 65–99)

## 2017-05-04 MED ORDER — INSULIN ASPART 100 UNIT/ML ~~LOC~~ SOLN
0.0000 [IU] | Freq: Three times a day (TID) | SUBCUTANEOUS | Status: DC
  Administered 2017-05-04: 2 [IU] via SUBCUTANEOUS
  Administered 2017-05-05: 3 [IU] via SUBCUTANEOUS

## 2017-05-04 NOTE — Progress Notes (Signed)
PROGRESS NOTE  Gilbert Dominguez  JJK:[id number] DOB: 02-18-1946 DOA: 04/28/2017 PCP: Leonard Downing, MD  Outpatient Specialists: Cardiology, [facility] Pulmonology, [facility]  Brief Narrative: Gilbert Dominguez is a 71 y.o. male with a history of chronic hypoxic respiratory failure, severe pulmonary HTN and fibrosis, chronic diastolic CHF, stage III CKD, RA, and T2DM who was brought to the ED by EMS after he was found to be tachycardic when he called EMS for his wife who fell at home. He felt dizzy when rising too quickly and felt weak. Treatment with adenosine per cardiology recommendations for SVT caused temporary slowing of heart rate. He felt dyspneic with 2+ LE edema. ECG demonstrated atrial flutter with RVR, LPFB and RBBB. CTA chest showed no PE, but did show reflux into hepatic vein consistent with elevated right heart pressures. He was started on cardizem gtt and admitted with cardiology consultation. His weight on admission was 128lbs, up from dry weight of 123lbs. Eliquis was started and aspirin was subsequently stopped. IV lasix has been given. Troponin has been elevated mildly with flat trend and no chest pain. No ischemic work up is advised by cardiology. Diuresis has been ongoing, creatinine stable, still overloaded.   Assessment & Plan: Principal Problem:   Atrial tachycardia (HCC) Active Problems:   CHF exacerbation (HCC)   Tachycardia   Hypokalemia   Hyperglycemia   Atrial flutter (HCC)   Pulmonary HTN (HCC)  SVT/atrial flutter with RVR: TSH 1.824.  - Diltiazem 159m daily, rate stable. Avoiding amiodarone with pulm fibrosis. No need for digoxin currently.  - CHA2DS2-VASc Scoreis 3, started on eliquis 512mBID. Monitor CBC.   - Plan for AliveCor monitoring as outpatient with consideration of catheter ablation if recurrent episodes.  Acute on chronic diastolic and right venticular CHF: Above EDW of 123lbs on admission. BNP 2,262.7. Echo confirms RV systolic  dysfunction, preserved LVEF, grade 1 diastolic CHF. PA peak pressure 8748m.  - Strict I/O - Daily weights - Lasix 69m76m BID is improving volume status, creatinine stable. Will continue this. - LE U/S ordered for LE edema, no DVT  Chronic hypoxic respiratory failure due to pulmonary fibrosis and pulmonary HTN: 4L at rest, 8L on exertion at baseline, [facility] No current evidence of exacerbation.  - Continue home therapies including 4L supplemental O2 - Continuing prednisone 20mg49mly until follow up with Dr. [place] Melvyn Novas   Demand ischemia: Minimal and flat troponin elevation in setting of rapid heart rate. No chest pain.  - No ischemic evaluation planned per cardiology  Hypokalemia: Resolved. Mg nl.  - Monitor with diuresis  T2DM: Well-controlled with HbA1c 6.9%.  - With steroids, has had intermittent impaired glucose tolerance (fasting glucose remains well-controlled) - Change metformin and OSU to sensitive SSI.    Stage III CKD: Scr at baseline of 1.2 on admission - Improving below putative baseline with diuresis. Continue monitoring.  - Minimize nephrotoxins  Tobacco use: Restarted smoking 2 weeks ago after 37 years abstinence.  - Strongly emphasized need for cessation. Not smoking enough to require nicotine patch.   DVT prophylaxis: Eliquis Code Status: Full Family Communication: None at bedside Disposition Plan: Anticipate DC home once euvolemic, 24-48 hours.  Consultants:   Cardiology   Procedures:   Echocardiogram 05/02/2017: - Left ventricle: The cavity size was mildly reduced. Systolic   function was normal. The estimated ejection fraction was in the   range of 55% to 60%. Wall motion was normal; there were no   regional wall motion abnormalities.  Doppler parameters are   consistent with abnormal left ventricular relaxation (grade 1   diastolic dysfunction). - Ventricular septum: The contour showed diastolic flattening and   systolic  flattening. These changes are consistent with RV volume   and pressure overload. - Aortic valve: There was very mild stenosis. Valve area (VTI):   1.85 cm^2. Valve area (Vmax): 1.61 cm^2. Valve area (Vmean): 1.5   cm^2. - Right ventricle: The cavity size was severely dilated. Systolic   function was moderately to severely reduced. - Right atrium: The atrium was severely dilated. - Tricuspid valve: There was moderate-severe regurgitation directed   eccentrically. - Pulmonary arteries: Systolic pressure was severely increased. PA   peak pressure: 87 mm Hg (S).  Antimicrobials:  None   Subjective: Continues good urine output with minimal improvement in LE swelling. No chest pain. Dyspnea near baseline.   Objective: Vitals:   05/03/17 1634 05/03/17 2043 05/04/17 0200 05/04/17 0501  BP: 103/80 109/84  92/76  Pulse: 69 72 64 64  Resp: 18 (!) _0 Temp: 98.1 F (36.7 C) 97.6 F (36.4 C)  (!) 97.5 F (36.4 C)  TempSrc: Oral Oral  Oral  SpO2: 98% 97% 98% 96%  Weight:    56.5 kg (124 lb 8 oz)  Height:        Intake/Output Summary (Last 24 hours) at 05/04/17 1214 Last data filed at 05/04/17 1137  Gross per 24 hour  Intake              600 ml  Output             3750 ml  Net            -3150 ml   Filed Weights   05/02/17 0531 05/03/17 0500 05/04/17 0501  Weight: 57.7 kg (127 lb 1.6 oz) 57.2 kg (126 lb) 56.5 kg (124 lb 8 oz)    Examination: General exam: Thin, energetic male in no distress sitting on side of the bed Respiratory system: Nonlabored, normal rate on 4L by [place], SpO2 hovering in low 90%'s. Velcro-like crackles in all lung zones, worse at bases. No wheezing. Cardiovascular system: Irreg irreg with rate in 60-70's. No murmur, rub, or gallop. No JVD, and deeply pitting LE edema. Gastrointestinal system: Abdomen soft, non-tender, non-distended, with normoactive bowel sounds. No organomegaly or masses felt. Central nervous system: Alert and oriented. No focal  neurological deficits. Extremities: Warm, no deformities Skin: No rashes, lesions no ulcers. Senile purpura. Psychiatry: Judgement and insight appear normal. Mood & affect appropriate.   CBC:  Recent Labs Lab 04/28/17 1725 04/29/17 0443 05/01/17 0308 05/03/17 0305  WBC 11.8* 10.0 10.2 10.9*  NEUTROABS 11.1*  --   --   --   HGB 13.0 14.0 13.9 12.8*  HCT 40.0 43.3 43.4 40.8  MCV 91.7 93.1 93.1 93.8  PLT 197 220 251 836   Basic Metabolic Panel:  Recent Labs Lab 04/29/17 0443 05/01/17 0308 05/02/17 0307 05/03/17 0305 05/04/17 0251  NA 135 140 140 138 136  K 3.9 3.5 3.1* 3.5 4.5  CL 100* 103 103 99* 100*  CO2 _1 33* 31  GLUCOSE 165* 47* 46* 67 84  BUN 42* 35* 32* 25* 30*  CREATININE 1.20 0.95 0.86 0.87 0.89  CALCIUM 8.3* 9.0 8.8* 8.4* 9.0  MG 1.9 2.0  --  2.1  --   PHOS 3.5  --   --   --   --    GFR: Estimated Creatinine Clearance:  60 mL/min (by C-G formula based on SCr of 0.89 mg/dL). Liver Function Tests:  Recent Labs Lab 04/28/17 1725 04/29/17 0443 05/01/17 0308  AST 73* 71* 80*  ALT 39 38 48  ALKPHOS 88 86 116  BILITOT 2.3* 1.7* 1.0  PROT 6.6 5.6* 5.9*  ALBUMIN 3.1* 2.8* 2.8*   Coagulation Profile:  Recent Labs Lab 04/28/17 1725  INR 1.33   Cardiac Enzymes:  Recent Labs Lab 04/28/17 2303 04/29/17 0443 04/29/17 1018  TROPONINI 0.06* 0.07* 0.05*   CBG:  Recent Labs Lab 05/03/17 1633 05/03/17 2048 05/04/17 0303 05/04/17 0735 05/04/17 1116  GLUCAP 254* 138* 84 88 183*    Recent Results (from the past 240 hour(s))  MRSA PCR Screening     Status: None   Collection Time: 04/28/17 11:02 PM  Result Value Ref Range Status   MRSA by PCR NEGATIVE NEGATIVE Final    Comment:        The GeneXpert MRSA Assay (FDA approved for NASAL specimens only), is one component of a comprehensive MRSA colonization surveillance program. It is not intended to diagnose MRSA infection nor to guide or monitor treatment for MRSA infections.        Radiology Studies: No results found.  Scheduled Meds: . apixaban  5 mg Oral BID  . atorvastatin  20 mg Oral Daily  . dextromethorphan-guaiFENesin  1 tablet Oral BID  . diltiazem  120 mg Oral Daily  . famotidine  20 mg Oral QHS  . feeding supplement (PRO-STAT SUGAR FREE 64)  30 mL Oral BID  . furosemide  80 mg Intravenous BID  . insulin aspart  0-9 Units Subcutaneous TID WC  . loratadine  10 mg Oral Daily  . pantoprazole  40 mg Oral Daily  . predniSONE  20 mg Oral Q breakfast  . sodium chloride flush  3 mL Intravenous Q12H  . sodium chloride flush  3 mL Intravenous Q12H   Continuous Infusions:   LOS: 6 days   Time spent: 25 minutes.  Vance Gather, MD [facility] Pager [phone]  If 7PM-7AM, please contact night-coverage [email or web address] Password [facility] 05/04/2017, 12:14 PM

## 2017-05-04 NOTE — Progress Notes (Signed)
Progress Note  Patient Name: Gilbert Dominguez Date of Encounter: 05/04/2017  Primary Cardiologist: Dr. Debara Pickett  Subjective   Breathing improved, close to baseline. Still has lower extremity edema. He denies any recent chest pain or palpitations.   Inpatient Medications    Scheduled Meds: . apixaban  5 mg Oral BID  . atorvastatin  20 mg Oral Daily  . dextromethorphan-guaiFENesin  1 tablet Oral BID  . diltiazem  120 mg Oral Daily  . famotidine  20 mg Oral QHS  . feeding supplement (PRO-STAT SUGAR FREE 64)  30 mL Oral BID  . furosemide  80 mg Intravenous BID  . metFORMIN  500 mg Oral BID WC   And  . glipiZIDE  2.5 mg Oral BID WC  . loratadine  10 mg Oral Daily  . pantoprazole  40 mg Oral Daily  . predniSONE  20 mg Oral Q breakfast  . sodium chloride flush  3 mL Intravenous Q12H  . sodium chloride flush  3 mL Intravenous Q12H   Continuous Infusions:  PRN Meds: sodium chloride   Vital Signs    Vitals:   05/03/17 1634 05/03/17 2043 05/04/17 0200 05/04/17 0501  BP: 103/80 109/84  92/76  Pulse: 69 72 64 64  Resp: 18 (!) _0 Temp: 98.1 F (36.7 C) 97.6 F (36.4 C)  (!) 97.5 F (36.4 C)  TempSrc: Oral Oral  Oral  SpO2: 98% 97% 98% 96%  Weight:    124 lb 8 oz (56.5 kg)  Height:        Intake/Output Summary (Last 24 hours) at 05/04/17 0818 Last data filed at 05/04/17 0600  Gross per 24 hour  Intake              240 ml  Output             3800 ml  Net            -3560 ml   Filed Weights   05/02/17 0531 05/03/17 0500 05/04/17 0501  Weight: 127 lb 1.6 oz (57.7 kg) 126 lb (57.2 kg) 124 lb 8 oz (56.5 kg)    Telemetry    NSR, HR in 60's - 80's. No ectopic events.   - Personally Reviewed  ECG    No new tracings.   Physical Exam   General: Well developed, Caucasian male appearing in no acute distress. Head: Normocephalic, atraumatic.  Neck: Supple without bruits, JVD at 8cm. Lungs:  Resp regular and unlabored, rhonchi throughout lung fields. Heart:  RRR, S1, S2, no S3, S4, or murmur; no rub. Abdomen: Soft, non-tender, non-distended with normoactive bowel sounds. No hepatomegaly. No rebound/guarding. No obvious abdominal masses. Extremities: No clubbing or cyanosis, 2+ pitting edema up to mid-shins, worse on left (improving). Distal pedal pulses are 2+ bilaterally. Neuro: Alert and oriented X 3. Moves all extremities spontaneously. Psych: Normal affect.  Labs    Chemistry Recent Labs Lab 04/28/17 1725 04/29/17 0443 05/01/17 0308 05/02/17 0307 05/03/17 0305 05/04/17 0251  NA 141 135 140 140 138 136  K 3.4* 3.9 3.5 3.1* 3.5 4.5  CL 101 100* 103 103 99* 100*  CO2 _1 33* 31  GLUCOSE 116* 165* 47* 46* 67 84  BUN 40* 42* 35* 32* 25* 30*  CREATININE 1.15 1.20 0.95 0.86 0.87 0.89  CALCIUM 9.0 8.3* 9.0 8.8* 8.4* 9.0  PROT 6.6 5.6* 5.9*  --   --   --   ALBUMIN 3.1* 2.8* 2.8*  --   --   --  AST 73* 71* 80*  --   --   --   ALT 39 38 48  --   --   --   ALKPHOS 88 86 116  --   --   --   BILITOT 2.3* 1.7* 1.0  --   --   --   GFRNONAA >60 59* >60 >60 >60 >60  GFRAA >60 >60 >60 >60 >60 >60  ANIONGAP _0 Hematology Recent Labs Lab 04/29/17 0443 05/01/17 0308 05/03/17 0305  WBC 10.0 10.2 10.9*  RBC 4.65 4.66 4.35  HGB 14.0 13.9 12.8*  HCT 43.3 43.4 40.8  MCV 93.1 93.1 93.8  MCH 30.1 29.8 29.4  MCHC 32.3 32.0 31.4  RDW 18.4* 18.2* 18.1*  PLT 220 251 255    Cardiac Enzymes Recent Labs Lab 04/28/17 2303 04/29/17 0443 04/29/17 1018  TROPONINI 0.06* 0.07* 0.05*   No results for input(s): TROPIPOC in the last 168 hours.   BNP Recent Labs Lab 04/28/17 1725  BNP 2,262.7*     DDimer  Recent Labs Lab 04/28/17 2303  DDIMER 0.90*     Radiology    No results found.  Cardiac Studies   Echocardiogram: 05/01/2017 Study Conclusions  - Left ventricle: The cavity size was mildly reduced. Systolic function was normal. The estimated ejection fraction was in the range of 55% to 60%. Wall  motion was normal; there were no regional wall motion abnormalities. Doppler parameters are consistent with abnormal left ventricular relaxation (grade 1 diastolic dysfunction). - Ventricular septum: The contour showed diastolic flattening and systolic flattening. These changes are consistent with RV volume and pressure overload. - Aortic valve: There was very mild stenosis. Valve area (VTI): 1.85 cm^2. Valve area (Vmax): 1.61 cm^2. Valve area (Vmean): 1.5 cm^2. - Right ventricle: The cavity size was severely dilated. Systolic function was moderately to severely reduced. - Right atrium: The atrium was severely dilated. - Tricuspid valve: There was moderate-severe regurgitation directed eccentrically. - Pulmonary arteries: Systolic pressure was severely increased. PA peak pressure: 87 mm Hg (S).  Patient Profile     71 y.o. male w/ PMH of chronic respiratory failure (on home O2), pulmonary fibrosis, pulmonary HTN, Type 2 DM,  former tobacco use, and Stage 3 CKD who presented to [facility] ED on 7/11 with progressive dyspnea and weakness. Cardiology consulted for atrial flutter with RVR.   Assessment & Plan    1. New onset Atrial flutter with RVR - initially in what was thought to be SVT --> Adenosine was administered and slowed HR and showed atrial flutter with RVR. Converted on 7/12 to NSR.  - This patients CHA2DS2-VASc Score and unadjusted Ischemic Stroke Rate (% per year) is equal to 3.2 % stroke rate/year from a score of 3 (HTN, DM, Age). He has been started on Eliquis 64m BID for anticoagulation. - initially on IV Cardizem but this has been switched to Cardizem CD 1240mdaily. Dr. KlCaryl Comesecommended an AliveCor monitor and consideration of catheter ablation if he has recurrent episodes.   2. Elevated troponin - 0.06 --> 0.07 --> 0.05 - likely related to demand ischemia in the presence of tachycardia. Echo shows a preserved EF with no WMA. He denies any recent  chest pain. No plans for further ischemic evaluation at this time.   3. Acute on chronic diastolic heart failure - BNP elevated to 2262 on admission. - echo shows a preserved EF of 55-60% with Grade 1 DD and no WMA.  -  has been diuresing with IV Lasix 71m BID (on 839mAM/4021mM prior to admission). He is overall -13.1 L (-3.5 L yesterday). He still has 2+ pitting edema up to his mid-shins, continue with IV Lasix at current dosing for at least another 24-48 hours as kidney function remains stable. Has not been out of bed since admission (will order PT Eval).   4. Pulmonary HTN - PA pressure at 87 mm Hg on echo this admission.   5. CKD stage III - baseline creatinine 1.1-1.2 - creatinine stable at 0.89 this AM.   6. Chronic Hypoxic Respiratory Failure - on 4L- 8L at baseline.  - per admitting team.   Signed, BriErma HeritagePA-C 8:18 AM 05/04/2017 Pager: [phone]

## 2017-05-04 NOTE — Progress Notes (Signed)
Inpatient Diabetes Program Recommendations  AACE/ADA: New Consensus Statement on Inpatient Glycemic Control (2015)  Target Ranges:  Prepandial:   less than 140 mg/dL      Peak postprandial:   less than 180 mg/dL (1-2 hours)      Critically ill patients:  140 - 180 mg/dL   Lab Results  Component Value Date   GLUCAP 183 (H) 05/04/2017   HGBA1C 6.9 (H) 04/28/2017    Review of Glycemic ControlResults for EILEEN, CROSWELL (MRN [id number]) as of 05/04/2017 11:50  Ref. Range 05/03/2017 07:48 05/03/2017 11:35 05/03/2017 16:33 05/03/2017 20:48 05/04/2017 03:03 05/04/2017 07:35 05/04/2017 11:16  Glucose-Capillary Latest Ref Range: 65 - 99 mg/dL 122 (H) 222 (H) 254 (H) 138 (H) 84 88 183 (H)    Please consider d/c of Glipizide and restart Novolog sensitive correction tid with meals. Text page sent to Dr. Bonner Puna.  Thanks, Adah Perl, RN, BC-ADM Inpatient Diabetes Coordinator Pager [phone] (8a-5p)

## 2017-05-04 NOTE — Plan of Care (Signed)
Problem: Skin Integrity: Goal: Risk for impaired skin integrity will decrease Outcome: Progressing Fall risk bundle remains in place. No injuries, falls or skin break down this shift. Will continue to monitor and assess this shift.

## 2017-05-04 NOTE — Care Management Important Message (Signed)
Important Message  Patient Details  Name: Gilbert Dominguez MRN: [id number] Date of Birth: 01-09-1946   Medicare Important Message Given:  Yes    [name] May 05/04/2017, 10:36 AM

## 2017-05-05 DIAGNOSIS — R7989 Other specified abnormal findings of blood chemistry: Secondary | ICD-10-CM

## 2017-05-05 LAB — CBC
HEMATOCRIT: 44.4 % (ref 39.0–52.0)
Hemoglobin: 14.1 g/dL (ref 13.0–17.0)
MCH: 29.6 pg (ref 26.0–34.0)
MCHC: 31.8 g/dL (ref 30.0–36.0)
MCV: 93.3 fL (ref 78.0–100.0)
PLATELETS: 374 10*3/uL (ref 150–400)
RBC: 4.76 MIL/uL (ref 4.22–5.81)
RDW: 18.4 % — AB (ref 11.5–15.5)
WBC: 14.7 10*3/uL — ABNORMAL HIGH (ref 4.0–10.5)

## 2017-05-05 LAB — BASIC METABOLIC PANEL
Anion gap: 11 (ref 5–15)
BUN: 32 mg/dL — AB (ref 6–20)
CHLORIDE: 98 mmol/L — AB (ref 101–111)
CO2: 31 mmol/L (ref 22–32)
CREATININE: 0.95 mg/dL (ref 0.61–1.24)
Calcium: 9.5 mg/dL (ref 8.9–10.3)
GFR calc Af Amer: >60 mL/min (ref >60)
GFR calc non Af Amer: >60 mL/min (ref >60)
GLUCOSE: 100 mg/dL — AB (ref 65–99)
POTASSIUM: 3.8 mmol/L (ref 3.5–5.1)
Sodium: 140 mmol/L (ref 135–145)

## 2017-05-05 LAB — GLUCOSE, CAPILLARY
GLUCOSE-CAPILLARY: 201 mg/dL — AB (ref 65–99)
Glucose-Capillary: 75 mg/dL (ref 65–99)
Glucose-Capillary: 155 mg/dL — ABNORMAL HIGH (ref 65–99)
Glucose-Capillary: 128 mg/dL — ABNORMAL HIGH (ref 65–99)

## 2017-05-05 MED ORDER — POTASSIUM CHLORIDE CRYS ER 20 MEQ PO TBCR
20.0000 meq | EXTENDED_RELEASE_TABLET | Freq: Every day | ORAL | Status: DC
  Administered 2017-05-05 – 2017-05-06 (×2): 20 meq via ORAL
  Filled 2017-05-05 (×2): qty 1

## 2017-05-05 NOTE — Progress Notes (Signed)
Tech offered Pt a bath. Pt stated not right now. Tech will check back with Pt regarding bath later.

## 2017-05-05 NOTE — Evaluation (Signed)
Physical Therapy Evaluation Patient Details Name: Gilbert Dominguez MRN: [id number] DOB: Feb 06, 1946 Today's Date: 05/05/2017   History of Present Illness  Gilbert Dominguez  is a 71 y.o. male, w CHF (EF 55%), Pulmonary fibrosis, Pulmonary htn, Rheumatoid arthritis, Dm2  apparently was not feelnig well at home when EMS came to examine his wife who had fallen. Pt was noted to be tachycardic and thought to be in Afib and tx with cardizem.  Pt c/o generalized weakness and feeling like he was going to pass out.    Clinical Impression  Pt admitted with above diagnosis. Pt currently with functional limitations due to the deficits listed below (see PT Problem List). Pt ambulated 120' with RW and min-guard A on 8L O2 with desat to 88% with return to 96% within 2 mins of sitting. Returned to 6L at rest. Recommend RW for home and pt agreeable for energy conservation.  Pt will benefit from skilled PT to increase their independence and safety with mobility to allow discharge to the venue listed below.       Follow Up Recommendations Home health PT    Equipment Recommendations  Rolling walker with 5" wheels    Recommendations for Other Services       Precautions / Restrictions Precautions Precautions: Other (comment) Precaution Comments: watch O2 sats Restrictions Weight Bearing Restrictions: No      Mobility  Bed Mobility               General bed mobility comments: pt sitting EOB upon entry  Transfers Overall transfer level: Needs assistance Equipment used: Rolling walker (2 wheeled) Transfers: Sit to/from Stand Sit to Stand: Supervision         General transfer comment: pt not familiar with use of RW so vc's for hand placement for standing and sitting  Ambulation/Gait Ambulation/Gait assistance: Min guard Ambulation Distance (Feet): 120 Feet Assistive device: Rolling walker (2 wheeled) Gait Pattern/deviations: Step-through pattern Gait velocity: decreased Gait velocity  interpretation: Below normal speed for age/gender General Gait Details: pt felt more stable with use of RW than with no AD and reports that he feels that it is time he used one. Discussed use of it for energy conservation. O2 sats down to 88% on 8L O2, recivered to 96% within 2 mins sitting  Stairs            Wheelchair Mobility    Modified Rankin (Stroke Patients Only)       Balance Overall balance assessment: Needs assistance Sitting-balance support: No upper extremity supported Sitting balance-Leahy Scale: Good     Standing balance support: No upper extremity supported Standing balance-Leahy Scale: Fair Standing balance comment: stable in static stance, reaches for stable surfaces when moving                             Pertinent Vitals/Pain Pain Assessment: No/denies pain    Home Living Family/patient expects to be discharged to:: Private residence Living Arrangements: Spouse/significant other;Children Available Help at Discharge: Family;Available PRN/intermittently Type of Home: House Home Access: Stairs to enter   [employer] of Steps: 1 Home Layout: One level Home Equipment: Shower seat;Grab bars - tub/shower Additional Comments: wife is on HD; pt typically drives wife to HD. daughter and son in law lives with them but they drive long distance truck so often gone. Son moving here soon to live with them     Prior Function Level of Independence: Independent  Hand Dominance        Extremity/Trunk Assessment   Upper Extremity Assessment Upper Extremity Assessment: Overall WFL for tasks assessed    Lower Extremity Assessment Lower Extremity Assessment: Generalized weakness    Cervical / Trunk Assessment Cervical / Trunk Assessment: Kyphotic  Communication   Communication: HOH  Cognition Arousal/Alertness: Awake/alert Behavior During Therapy: WFL for tasks assessed/performed Overall Cognitive Status: Within  Functional Limits for tasks assessed                                        General Comments General comments (skin integrity, edema, etc.): HR 83 bpm with ambulation    Exercises     Assessment/Plan    PT Assessment Patient needs continued PT services  PT Problem List Cardiopulmonary status limiting activity;Decreased activity tolerance;Decreased strength;Decreased mobility;Decreased knowledge of use of DME       PT Treatment Interventions DME instruction;Gait training;Stair training;Functional mobility training;Therapeutic activities;Therapeutic exercise;Balance training;Patient/family education    PT Goals (Current goals can be found in the Care Plan section)  Acute Rehab PT Goals Patient Stated Goal: return home PT Goal Formulation: With patient Time For Goal Achievement: 05/19/17 Potential to Achieve Goals: Good    Frequency Min 3X/week   Barriers to discharge Decreased caregiver support he and wife (who is on HD) home alone much of the time    Co-evaluation               AM-PAC PT "6 Clicks" Daily Activity  Outcome Measure Difficulty turning over in bed (including adjusting bedclothes, sheets and blankets)?: A Little Difficulty moving from lying on back to sitting on the side of the bed? : A Little Difficulty sitting down on and standing up from a chair with arms (e.g., wheelchair, bedside commode, etc,.)?: A Little Help needed moving to and from a bed to chair (including a wheelchair)?: A Little Help needed walking in hospital room?: A Little Help needed climbing 3-5 steps with a railing? : A Lot 6 Click Score: 17    End of Session Equipment Utilized During Treatment: Gait belt;Oxygen Activity Tolerance: Patient tolerated treatment well Patient left: in chair;with call bell/phone within reach Nurse Communication: Mobility status PT Visit Diagnosis: Unsteadiness on feet (R26.81);Muscle weakness (generalized) (M62.81)    Time:  5284-1324 PT Time Calculation (min) (ACUTE ONLY): 23 min   Charges:   PT Evaluation $PT Eval Moderate Complexity: 1 Procedure PT Treatments $Gait Training: 8-22 mins   PT G Codes:        Leighton Roach, PT  Acute Rehab Services  [place] 05/05/2017, 1:56 PM

## 2017-05-05 NOTE — Progress Notes (Addendum)
PROGRESS NOTE  Gilbert Dominguez  KVQ:[id number] DOB: [birth year] DOA: 04/28/2017 PCP: Leonard Downing, MD  Outpatient Specialists: Cardiology, [facility] Pulmonology, [facility]  Brief Narrative: Gilbert Dominguez is a 71 y.o. male with a history of chronic hypoxic respiratory failure, severe pulmonary HTN and fibrosis, chronic diastolic CHF, stage III CKD, RA, and T2DM who was brought to the ED by EMS after he was found to be tachycardic when he called EMS for his wife who fell at home. He felt dizzy when rising too quickly and felt weak. Treatment with adenosine per cardiology recommendations for SVT caused temporary slowing of heart rate. He felt dyspneic with 2+ LE edema. ECG demonstrated atrial flutter with RVR, LPFB and RBBB. CTA chest showed no PE, but did show reflux into hepatic vein consistent with elevated right heart pressures. He was started on cardizem gtt and admitted with cardiology consultation. His weight on admission was 128lbs, up from dry weight of 123lbs. Eliquis was started and aspirin was subsequently stopped. IV lasix has been given. Troponin has been elevated mildly with flat trend and no chest pain. No ischemic work up is advised by cardiology. Diuresis has been ongoing, creatinine stable, still overloaded.   Assessment & Plan: Principal Problem:   Atrial tachycardia (HCC) Active Problems:   CHF exacerbation (HCC)   Tachycardia   Hypokalemia   Hyperglycemia   Atrial flutter (HCC)   Pulmonary HTN (HCC)  SVT/atrial flutter with RVR: TSH 1.824.  - Diltiazem 153m daily, rate stable. Avoiding amiodarone with pulm fibrosis. No need for digoxin currently.  - CHA2DS2-VASc Scoreis 3, started on eliquis 521mBID. Monitor CBC.   - Plan for AliveCor monitoring as outpatient with consideration of catheter ablation if recurrent episodes.  Acute on chronic diastolic and right venticular CHF: Above EDW of 123lbs on admission. BNP 2,262.7. Echo confirms RV systolic  dysfunction, preserved LVEF, grade 1 diastolic CHF. PA peak pressure 8791m.  - Strict I/O - Daily weights - Lasix 91m67m BID is improving volume status, creatinine stable. Planning to start oral diuretic tomorrow, and likely discharge home. - LE U/S ordered for LE edema, no DVT  Chronic hypoxic respiratory failure due to pulmonary fibrosis and pulmonary HTN: 4L at rest, 8L on exertion at baseline, [facility]. No current evidence of exacerbation.  - Continue home therapies including 4L supplemental O2 - Continuing prednisone 20mg68mly until follow up with Dr. [place] Melvyn Novas   Demand ischemia: Minimal and flat troponin elevation in setting of rapid heart rate. No chest pain.  - No ischemic evaluation planned per cardiology  Hypokalemia: Resolved. Mg nl.  - Monitor with diuresis  Leukocytosis - likely secondary to steroids.   RLQ Abd pain - Pt reports pain has resolved now.   T2DM: Well-controlled with HbA1c 6.9%.  - With steroids, has had intermittent impaired glucose tolerance (fasting glucose remains well-controlled) - Change metformin and [facility] to sensitive SSI.   CBG (last 3)   Recent Labs  05/04/17 2337 05/05/17 0749 05/05/17 1134  GLUCAP 153* 75 155*   Stage III CKD: Scr much improved, now 0.95 - Improving below putative baseline with diuresis. Continue monitoring.  - Minimize nephrotoxins  Tobacco use: Restarted smoking 2 weeks ago after 37 years abstinence.  - Strongly emphasized cessation.   DVT prophylaxis: Eliquis Code Status: Full Family Communication: None at bedside Disposition Plan: Anticipate DC home once euvolemic, 24-48 hours.  Consultants:   Cardiology   Procedures:   Echocardiogram 05/02/2017: - Left ventricle: The cavity size  was mildly reduced. Systolic   function was normal. The estimated ejection fraction was in the  range of 55% to 60%. Wall motion was normal; there were no   regional wall motion abnormalities. Doppler  parameters are consistent with abnormal left ventricular relaxation (grade 1   diastolic dysfunction). - Ventricular septum: The contour showed diastolic flattening and  systolic flattening. These changes are consistent with RV volume   and pressure overload. - Aortic valve: There was very mild stenosis. Valve area (VTI):  1.85 cm^2. Valve area (Vmax): 1.61 cm^2. Valve area (Vmean): 1.5   cm^2. - Right ventricle: The cavity size was severely dilated. Systolic  function was moderately to severely reduced. - Right atrium: The atrium was severely dilated. - Tricuspid valve: There was moderate-severe regurgitation directed  eccentrically. - Pulmonary arteries: Systolic pressure was severely increased. PA  peak pressure: 87 mm Hg (S).  Antimicrobials:  None   Subjective: Pt had some RLQ abd pain this morning but says it has resolved now, has been ambulating in room.  No complaints.    Objective: Vitals:   05/04/17 2251 05/04/17 2253 05/05/17 0400 05/05/17 1345  BP:   130/78 105/74  Pulse: 70 71 67 75  Resp: _0 Temp:   98.1 F (36.7 C) 98.3 F (36.8 C)  TempSrc:   Oral Oral  SpO2: 100% 100% 99% 98%  Weight:   56.6 kg (124 lb 12.5 oz)   Height:        Intake/Output Summary (Last 24 hours) at 05/05/17 1404 Last data filed at 05/05/17 1335  Gross per 24 hour  Intake              720 ml  Output             4425 ml  Net            -3705 ml   Filed Weights   05/03/17 0500 05/04/17 0501 05/05/17 0400  Weight: 57.2 kg (126 lb) 56.5 kg (124 lb 8 oz) 56.6 kg (124 lb 12.5 oz)    Examination: General exam: Thin, male in no distress sitting on side of the bed and ambulating at times.  Respiratory system: Nonlabored, normal rate on 4L by Bland, SpO2 hovering in low 90%'s. No wheezing. Cardiovascular system: Irreg irreg with rate in 60-70's. No murmur, rub, or gallop. No JVD, and pitting BLE edema. Gastrointestinal system: Abdomen soft, non-tender, non-distended, with normoactive  bowel sounds. No organomegaly or masses felt. Central nervous system: Alert and oriented. No focal neurological deficits. Extremities: Warm, no deformities Skin: No rashes, lesions no ulcers. Senile purpura. Psychiatry: Judgement and insight appear normal. Mood & affect appropriate.   CBC:  Recent Labs Lab 04/28/17 1725 04/29/17 0443 05/01/17 0308 05/03/17 0305 05/05/17 0311  WBC 11.8* 10.0 10.2 10.9* 14.7*  NEUTROABS 11.1*  --   --   --   --   HGB 13.0 14.0 13.9 12.8* 14.1  HCT 40.0 43.3 43.4 40.8 44.4  MCV 91.7 93.1 93.1 93.8 93.3  PLT 197 220 251 255 510   Basic Metabolic Panel:  Recent Labs Lab 04/29/17 0443 05/01/17 0308 05/02/17 0307 05/03/17 0305 05/04/17 0251 05/05/17 0311  NA 135 140 140 138 136 140  K 3.9 3.5 3.1* 3.5 4.5 3.8  CL 100* 103 103 99* 100* 98*  CO2 _1 33* 31 31  GLUCOSE 165* 47* 46* 67 84 100*  BUN 42* 35* 32* 25* 30* 32*  CREATININE 1.20 0.95  0.86 0.87 0.89 0.95  CALCIUM 8.3* 9.0 8.8* 8.4* 9.0 9.5  MG 1.9 2.0  --  2.1  --   --   PHOS 3.5  --   --   --   --   --    GFR: Estimated Creatinine Clearance: 56.3 mL/min (by C-G formula based on SCr of 0.95 mg/dL). Liver Function Tests:  Recent Labs Lab 04/28/17 1725 04/29/17 0443 05/01/17 0308  AST 73* 71* 80*  ALT 39 38 48  ALKPHOS 88 86 116  BILITOT 2.3* 1.7* 1.0  PROT 6.6 5.6* 5.9*  ALBUMIN 3.1* 2.8* 2.8*   Coagulation Profile:  Recent Labs Lab 04/28/17 1725  INR 1.33   Cardiac Enzymes:  Recent Labs Lab 04/28/17 2303 04/29/17 0443 04/29/17 1018  TROPONINI 0.06* 0.07* 0.05*   CBG:  Recent Labs Lab 05/04/17 1116 05/04/17 1627 05/04/17 2337 05/05/17 0749 05/05/17 1134  GLUCAP 183* 110* 153* 75 155*    Recent Results (from the past 240 hour(s))  MRSA PCR Screening     Status: None   Collection Time: 04/28/17 11:02 PM  Result Value Ref Range Status   MRSA by PCR NEGATIVE NEGATIVE Final    Comment:        The GeneXpert MRSA Assay (FDA approved for NASAL  specimens only), is one component of a comprehensive MRSA colonization surveillance program. It is not intended to diagnose MRSA infection nor to guide or monitor treatment for MRSA infections.       Radiology Studies: No results found.  Scheduled Meds: . apixaban  5 mg Oral BID  . atorvastatin  20 mg Oral Daily  . dextromethorphan-guaiFENesin  1 tablet Oral BID  . diltiazem  120 mg Oral Daily  . famotidine  20 mg Oral QHS  . feeding supplement (PRO-STAT SUGAR FREE 64)  30 mL Oral BID  . furosemide  80 mg Intravenous BID  . insulin aspart  0-9 Units Subcutaneous TID WC  . loratadine  10 mg Oral Daily  . pantoprazole  40 mg Oral Daily  . potassium chloride  20 mEq Oral Daily  . predniSONE  20 mg Oral Q breakfast  . sodium chloride flush  3 mL Intravenous Q12H  . sodium chloride flush  3 mL Intravenous Q12H   Continuous Infusions:   LOS: 7 days   Time spent: 27 minutes.  Irwin Brakeman, MD [facility] Pager [phone]  If 7PM-7AM, please contact night-coverage [email or web address] Password [facility] 05/05/2017, 2:04 PM

## 2017-05-05 NOTE — Progress Notes (Signed)
Progress Note  Patient Name: Gilbert Dominguez Date of Encounter: 05/05/2017  Primary Cardiologist: Dr. Debara Pickett  Subjective   Dyspnea remains stable, overall not feeling bad but edema persists.  Inpatient Medications    Scheduled Meds: . apixaban  5 mg Oral BID  . atorvastatin  20 mg Oral Daily  . dextromethorphan-guaiFENesin  1 tablet Oral BID  . diltiazem  120 mg Oral Daily  . famotidine  20 mg Oral QHS  . feeding supplement (PRO-STAT SUGAR FREE 64)  30 mL Oral BID  . furosemide  80 mg Intravenous BID  . insulin aspart  0-9 Units Subcutaneous TID WC  . loratadine  10 mg Oral Daily  . pantoprazole  40 mg Oral Daily  . predniSONE  20 mg Oral Q breakfast  . sodium chloride flush  3 mL Intravenous Q12H  . sodium chloride flush  3 mL Intravenous Q12H   Continuous Infusions:  PRN Meds: sodium chloride   Vital Signs    Vitals:   05/04/17 2045 05/04/17 2251 05/04/17 2253 05/05/17 0400  BP: 119/83   130/78  Pulse: 76 70 71 67  Resp: _0 Temp: 97.8 F (36.6 C)   98.1 F (36.7 C)  TempSrc: Axillary   Oral  SpO2: 93% 100% 100% 99%  Weight:    124 lb 12.5 oz (56.6 kg)  Height:        Intake/Output Summary (Last 24 hours) at 05/05/17 0957 Last data filed at 05/05/17 0907  Gross per 24 hour  Intake              840 ml  Output             4350 ml  Net            -3510 ml   Filed Weights   05/03/17 0500 05/04/17 0501 05/05/17 0400  Weight: 126 lb (57.2 kg) 124 lb 8 oz (56.5 kg) 124 lb 12.5 oz (56.6 kg)    Telemetry    NSR - Personally Reviewed  Physical Exam   GEN: Very thin, cachectic appearing M in no acute distress.  HEENT: Normocephalic, atraumatic, sclera non-icteric. Neck: No JVD or bruits. Cardiac: RRR no murmurs, rubs, or gallops. Radials/DP/PT 1+ and equal bilaterally.  Respiratory: Coarse BS bilaterally. Breathing is unlabored. GI: Mildly TTP RLQ, no overt masses, normal bowel sounds, nondistended MS: no deformity. Extremities: No  clubbing or cyanosis. 1-2+ pitting edema bilateral lower extremity edema with mild erythema, L>R. Distal pedal pulses in tact. Neuro:  AAOx3. Follows commands. Psych:  Responds to questions appropriately with a normal/jovial affect.  Labs    Chemistry Recent Labs Lab 04/28/17 1725 04/29/17 0443 05/01/17 0308  05/03/17 0305 05/04/17 0251 05/05/17 0311  NA 141 135 140  < > 138 136 140  K 3.4* 3.9 3.5  < > 3.5 4.5 3.8  CL 101 100* 103  < > 99* 100* 98*  CO2 _1 < > 33* 31 31  GLUCOSE 116* 165* 47*  < > 67 84 100*  BUN 40* 42* 35*  < > 25* 30* 32*  CREATININE 1.15 1.20 0.95  < > 0.87 0.89 0.95  CALCIUM 9.0 8.3* 9.0  < > 8.4* 9.0 9.5  PROT 6.6 5.6* 5.9*  --   --   --   --   ALBUMIN 3.1* 2.8* 2.8*  --   --   --   --   AST 73* 71* 80*  --   --   --   --  ALT 39 38 48  --   --   --   --   ALKPHOS 88 86 116  --   --   --   --   BILITOT 2.3* 1.7* 1.0  --   --   --   --   GFRNONAA >60 59* >60  < > >60 >60 >60  GFRAA >60 >60 >60  < > >60 >60 >60  ANIONGAP _0 < > _1 < > = values in this interval not displayed.   Hematology Recent Labs Lab 05/01/17 0308 05/03/17 0305 05/05/17 0311  WBC 10.2 10.9* 14.7*  RBC 4.66 4.35 4.76  HGB 13.9 12.8* 14.1  HCT 43.4 40.8 44.4  MCV 93.1 93.8 93.3  MCH 29.8 29.4 29.6  MCHC 32.0 31.4 31.8  RDW 18.2* 18.1* 18.4*  PLT 251 255 374    Cardiac Enzymes Recent Labs Lab 04/28/17 2303 04/29/17 0443 04/29/17 1018  TROPONINI 0.06* 0.07* 0.05*   No results for input(s): TROPIPOC in the last 168 hours.   BNP Recent Labs Lab 04/28/17 1725  BNP 2,262.7*     DDimer  Recent Labs Lab 04/28/17 2303  DDIMER 0.90*     Radiology    No results found.  Cardiac Studies   Echocardiogram: 05/01/2017 Study Conclusions  - Left ventricle: The cavity size was mildly reduced. Systolic function was normal. The estimated ejection fraction was in the range of 55% to 60%. Wall motion was normal; there were no regional wall  motion abnormalities. Doppler parameters are consistent with abnormal left ventricular relaxation (grade 1 diastolic dysfunction). - Ventricular septum: The contour showed diastolic flattening and systolic flattening. These changes are consistent with RV volume and pressure overload. - Aortic valve: There was very mild stenosis. Valve area (VTI): 1.85 cm^2. Valve area (Vmax): 1.61 cm^2. Valve area (Vmean): 1.5 cm^2. - Right ventricle: The cavity size was severely dilated. Systolic function was moderately to severely reduced. - Right atrium: The atrium was severely dilated. - Tricuspid valve: There was moderate-severe regurgitation directed eccentrically. - Pulmonary arteries: Systolic pressure was severely increased. PA peak pressure: 87 mm Hg (S).  Patient Profile     71 y.o. male w/ PMH of chronic respiratory failure (on home O2), pulmonary fibrosis, pulmonary HTN, Type 2 DM, former tobacco use, RA, Stage 3 CKD, chronic diastolic CHF who presented to [facility] ED on 7/11 with progressive dyspnea and weakness. Cardiology consulted for atrial flutter with RVR, also a/c diastolic CHF. LLE duplex 04/30/17 neg for DVT.  Assessment & Plan    1. New onset Atrial flutter with RVR - converted to NSR on 04/29/17, now maintaining NSR on diltiazem. He is also now on Eliquis 40m BID. Dr. KCaryl Comesrecommended an AliveCor monitor and consideration of catheter ablation if he has recurrent episodes.   2. Acute on chronic diastolic CHF/RHF - continues to diurese briskly (-4.9UOP yesterday), prior d/c weight was 119, anticipate aiming towards this once more. Add KCl 232m daily. Will add 2L fluid/2g sodium restriction to diet and.  3. Elevated troponin (minimal) - felt due to demand ischemia. Echo shows a preserved EF with no WMA. He denies any recent chest pain. No plans for further ischemic evaluation at this time.  4. Severe pulm HTN (pulmonary fibrosis/chronic respiratory failure) -  prior VQ neg for chronic thromboembolic disease, CTA suboptimal this admission but no acute PE.  5. CKD stage III - Cr appears stable.  6. Leukocytosis - patient reports RLQ pain  overnight worse with shifting in bed/straining and coughing - would recommend further attention by IM.   Signed, Charlie Pitter, PA-C  05/05/2017, 9:57 AM

## 2017-05-05 NOTE — Plan of Care (Signed)
Problem: Safety: Goal: Ability to remain free from injury will improve Outcome: Progressing Fall risk bundle in place. No injuries, falls or skin break down this shift. Will continue to monitor and assess.   Problem: Pain Managment: Goal: General experience of comfort will improve Outcome: Progressing Pt complained of LRQ pain upon palpation of abdomen during assessment. The area is very tender. VS stable, afebrile. On call Hospitalist notified and she said to monitor for increasing pain or fever.

## 2017-05-06 LAB — BASIC METABOLIC PANEL
Anion gap: 9 (ref 5–15)
BUN: 33 mg/dL — ABNORMAL HIGH (ref 6–20)
CALCIUM: 9 mg/dL (ref 8.9–10.3)
CO2: 33 mmol/L — AB (ref 22–32)
CREATININE: 0.87 mg/dL (ref 0.61–1.24)
Chloride: 99 mmol/L — ABNORMAL LOW (ref 101–111)
GFR calc Af Amer: >60 mL/min (ref >60)
GFR calc non Af Amer: >60 mL/min (ref >60)
GLUCOSE: 126 mg/dL — AB (ref 65–99)
Potassium: 3.6 mmol/L (ref 3.5–5.1)
Sodium: 141 mmol/L (ref 135–145)

## 2017-05-06 LAB — CBC
HCT: 41.5 % (ref 39.0–52.0)
Hemoglobin: 13.3 g/dL (ref 13.0–17.0)
MCH: 29.9 pg (ref 26.0–34.0)
MCHC: 32 g/dL (ref 30.0–36.0)
MCV: 93.3 fL (ref 78.0–100.0)
PLATELETS: 305 10*3/uL (ref 150–400)
RBC: 4.45 MIL/uL (ref 4.22–5.81)
RDW: 17.6 % — ABNORMAL HIGH (ref 11.5–15.5)
WBC: 15.7 10*3/uL — ABNORMAL HIGH (ref 4.0–10.5)

## 2017-05-06 LAB — GLUCOSE, CAPILLARY: Glucose-Capillary: 93 mg/dL (ref 65–99)

## 2017-05-06 MED ORDER — DILTIAZEM HCL ER COATED BEADS 120 MG PO CP24: 120.0000 mg | ORAL_CAPSULE | Freq: Every day | ORAL | 0 refills | Status: DC

## 2017-05-06 MED ORDER — METFORMIN HCL ER 500 MG PO TB24: ORAL_TABLET | ORAL | 0 refills | Status: AC

## 2017-05-06 MED ORDER — BUMETANIDE 2 MG PO TABS: 2.0000 mg | ORAL_TABLET | Freq: Two times a day (BID) | ORAL | 0 refills | Status: AC

## 2017-05-06 MED ORDER — APIXABAN 5 MG PO TABS: 5.0000 mg | ORAL_TABLET | Freq: Two times a day (BID) | ORAL | 0 refills | Status: AC

## 2017-05-06 MED ORDER — PRO-STAT SUGAR FREE PO LIQD: 30.0000 mL | Freq: Two times a day (BID) | ORAL | 0 refills | Status: AC

## 2017-05-06 MED ORDER — BUMETANIDE 2 MG PO TABS
2.0000 mg | ORAL_TABLET | Freq: Two times a day (BID) | ORAL | Status: DC
  Administered 2017-05-06: 2 mg via ORAL
  Filled 2017-05-06: qty 1

## 2017-05-06 NOTE — Progress Notes (Signed)
Progress Note  Patient Name: Gilbert Dominguez Date of Encounter: 05/06/2017  Primary Cardiologist: [facility]  Subjective   Breakfast is late coming. No CP or acute worsening of SOB. Minimal improvement in edema. States they never fully go down.  Inpatient Medications    Scheduled Meds: . apixaban  5 mg Oral BID  . atorvastatin  20 mg Oral Daily  . dextromethorphan-guaiFENesin  1 tablet Oral BID  . diltiazem  120 mg Oral Daily  . famotidine  20 mg Oral QHS  . feeding supplement (PRO-STAT SUGAR FREE 64)  30 mL Oral BID  . furosemide  80 mg Intravenous BID  . insulin aspart  0-9 Units Subcutaneous TID WC  . loratadine  10 mg Oral Daily  . pantoprazole  40 mg Oral Daily  . potassium chloride  20 mEq Oral Daily  . predniSONE  20 mg Oral Q breakfast  . sodium chloride flush  3 mL Intravenous Q12H  . sodium chloride flush  3 mL Intravenous Q12H   Continuous Infusions:  PRN Meds: sodium chloride   Vital Signs    Vitals:   05/05/17 1345 05/05/17 1535 05/05/17 2133 05/06/17 0500  BP: 105/74 108/77 114/87 115/83  Pulse: 75 62 72 65  Resp: 20 (!) _0 Temp: 98.3 F (36.8 C) (!) 97.5 F (36.4 C) 97.6 F (36.4 C) (!) 97.4 F (36.3 C)  TempSrc: Oral Axillary Axillary Axillary  SpO2: 98% 100% 92% 100%  Weight:      Height:        Intake/Output Summary (Last 24 hours) at 05/06/17 0841 Last data filed at 05/06/17 7824  Gross per 24 hour  Intake              702 ml  Output             2575 ml  Net            -1873 ml   Filed Weights   05/03/17 0500 05/04/17 0501 05/05/17 0400  Weight: 126 lb (57.2 kg) 124 lb 8 oz (56.5 kg) 124 lb 12.5 oz (56.6 kg)    Telemetry    NSR occ PVCs - Personally Reviewed   Physical Exam   GEN: Very thin, cachectic appearing M in no acute distress.  HEENT: Normocephalic, atraumatic, sclera non-icteric. Neck:No JVD or bruits. Cardiac:RRR no murmurs, rubs, or gallops. Radials/DP/PT 1+ and equal bilaterally.  Respiratory: Coarse BS  bilaterally. Breathing is unlabored. MP:NTIRWE TTP RLQ, no overt masses, normal bowel sounds, nondistended MS:no deformity. Extremities: No clubbing or cyanosis. 1+ pitting edema bilateral lower extremity edema with mild erythema, L>R. Distal pedal pulses in tact. Neuro:AAOx3. Follows commands. Psych:  Responds to questions appropriately with a normal/jovial affect.  Labs    Chemistry Recent Labs Lab 05/01/17 0308  05/04/17 0251 05/05/17 0311 05/06/17 0223  NA 140  < > 136 140 141  K 3.5  < > 4.5 3.8 3.6  CL 103  < > 100* 98* 99*  CO2 30  < > 31 31 33*  GLUCOSE 47*  < > 84 100* 126*  BUN 35*  < > 30* 32* 33*  CREATININE 0.95  < > 0.89 0.95 0.87  CALCIUM 9.0  < > 9.0 9.5 9.0  PROT 5.9*  --   --   --   --   ALBUMIN 2.8*  --   --   --   --   AST 80*  --   --   --   --  ALT 48  --   --   --   --   ALKPHOS 116  --   --   --   --   BILITOT 1.0  --   --   --   --   GFRNONAA >60  < > >60 >60 >60  GFRAA >60  < > >60 >60 >60  ANIONGAP 7  < > _0 < > = values in this interval not displayed.   Hematology Recent Labs Lab 05/03/17 0305 05/05/17 0311 05/06/17 0223  WBC 10.9* 14.7* 15.7*  RBC 4.35 4.76 4.45  HGB 12.8* 14.1 13.3  HCT 40.8 44.4 41.5  MCV 93.8 93.3 93.3  MCH 29.4 29.6 29.9  MCHC 31.4 31.8 32.0  RDW 18.1* 18.4* 17.6*  PLT 255 374 305    Cardiac Enzymes Recent Labs Lab 04/29/17 1018  TROPONINI 0.05*   No results for input(s): TROPIPOC in the last 168 hours.   Radiology    No results found.  Cardiac Studies   Echocardiogram: 05/01/2017 Study Conclusions  - Left ventricle: The cavity size was mildly reduced. Systolic function was normal. The estimated ejection fraction was in the range of 55% to 60%. Wall motion was normal; there were no regional wall motion abnormalities. Doppler parameters are consistent with abnormal left ventricular relaxation (grade 1 diastolic dysfunction). - Ventricular septum: The contour showed diastolic  flattening and systolic flattening. These changes are consistent with RV volume and pressure overload. - Aortic valve: There was very mild stenosis. Valve area (VTI): 1.85 cm^2. Valve area (Vmax): 1.61 cm^2. Valve area (Vmean): 1.5 cm^2. - Right ventricle: The cavity size was severely dilated. Systolic function was moderately to severely reduced. - Right atrium: The atrium was severely dilated. - Tricuspid valve: There was moderate-severe regurgitation directed eccentrically. - Pulmonary arteries: Systolic pressure was severely increased. PA peak pressure: 87 mm Hg (S).  Patient Profile     71 y.o. male with chronic respiratory failure (on home O2), pulmonary fibrosis, pulmonary HTN, Type 2 DM, former tobacco use, RA, Stage 3 CKD, chronic diastolic CHF who presented to [facility] ED on 7/11 with progressive dyspnea and weakness. Cardiology consulted for atrial flutter with RVR, also a/c diastolic CHF. LLE duplex 04/30/17 neg for DVT.  Assessment & Plan    1. New onset Atrial flutter with RVR - converted to NSR on 04/29/17, continues to maintain NSR on diltiazem. He is also now on Eliquis 28m BID. Dr. KCaryl Comesrecommended an AliveCor monitor and consideration of catheter ablation if he has recurrent episodes.   2. Acute on chronic diastolic CHF/RHF - continues to diurese but weight not significantly changed. Prior d/c weight was 119, left the hospital with 1+ ankle edema in 02/2017. Will discuss diuretic regimen with MD/endpoint of diuresis since he knows the pt from OP setting. Might need to consider changing to torsemide as OP since he had remained volume overload despite Lasix titration recently.  3. Elevated troponin (minimal) - felt due to demand ischemia. Echo shows a preserved EF with no WMA. He denies any recent chest pain. No plans for further ischemic evaluation at this time.  4. Severe pulm HTN (pulmonary fibrosis/chronic respiratory failure) - prior VQ neg for chronic  thromboembolic disease, CTA suboptimal this admission but no acute PE.  5. CKD stage III - Cr appears stable.  6. Leukocytosis - no further abd pain. IM feels WBC due to steroids.  Signed, DCharlie Pitter PA-C  05/06/2017, 8:41 AM

## 2017-05-06 NOTE — Progress Notes (Signed)
PT Cancellation Note  Patient Details Name: Gilbert Dominguez MRN: [id number] DOB: 06-18-1946   Cancelled Treatment:    Reason Eval/Treat Not Completed: Patient declined, no reason specified Patient very upset about not receiving breakfast tray upon arrival to room and adamantly  refused mobility until he has eaten breakfast. PT will check on pt later as time allows.    Salina April, PTA Pager: [phone]   05/06/2017, 9:15 AM

## 2017-05-06 NOTE — Discharge Summary (Signed)
Physician Discharge Summary  Gilbert Dominguez HGD:[id number] DOB: 22-Apr-1946 DOA: 04/28/2017  PCP: Leonard Downing, MD Outpatient Specialists: Cardiology, [facility] Pulmonology, [facility]  Admit date: 04/28/2017 Discharge date: 05/06/2017  Admitted From: Home  Disposition: Home   Recommendations for Outpatient Follow-up:  1. Follow up with PCP in 1 weeks 2. Follow up with pulm and cardiology on 05/27/17 as scheduled 3. Please obtain BMP/CBC in one week 4. Please monitor blood sugars and report any low blood sugars to PCP.   Discharge Condition: Stable   CODE STATUS: Full    Brief Hospitalization Summary: Please see all hospital notes, images, labs for full details of the hospitalization.  Gilbert Dominguez  is a 71 y.o. male, w CHF (EF 55%), Pulmonary fibrosis, Pulmonary htn, Rheumatoid arthritis, Dm2  apparently was not feelnig well at home when EMS came to examine his wife who had fallen. Pt was noted to be tachycardic and thought to be in Afib and tx with cardizem.  Pt c/o generalized weakness and feeling like he was going to pass out.    In ED, pt was tx with adenosine per cardiology recommendations and slowed down briefly but then again became tachycardic.  Pt will be admitted for tachycardia.   Brief Narrative: Gilbert Dominguez is a 71 y.o. male with a history of chronic hypoxic respiratory failure, severe pulmonary HTN and fibrosis, chronic diastolic CHF, stage III CKD, RA, and T2DM who was brought to the ED by EMS after he was found to be tachycardic when he called EMS for his wife who fell at home. He felt dizzy when rising too quickly and felt weak. Treatment with adenosine per cardiology recommendations for SVT caused temporary slowing of heart rate. He felt dyspneic with 2+ LE edema. ECG demonstrated atrial flutter with RVR, LPFB and RBBB. CTA chest showed no PE, but did show reflux into hepatic vein consistent with elevated right heart pressures. He was started on cardizem  gtt and admitted with cardiology consultation. His weight on admission was 128lbs, up from dry weight of 123lbs. Eliquis was started and aspirin was subsequently stopped. IV lasix has been given. Troponin has been elevated mildly with flat trend and no chest pain. No ischemic work up is advised by cardiology. Diuresis has been ongoing, creatinine stable, still overloaded.   Assessment & Plan: Principal Problem:   Atrial tachycardia (HCC) Active Problems:   CHF exacerbation (HCC)   Tachycardia   Hypokalemia   Hyperglycemia   Atrial flutter (HCC)   Pulmonary HTN (HCC)  SVT/atrial flutter with RVR: TSH 1.824.  - Diltiazem 181m daily, rate stable. Avoiding amiodarone with pulm fibrosis. No need for digoxin currently.  - CHA2DS2-VASc Scoreis 3, started on eliquis 536mBID. Monitor CBC.   - Plan for [facility] monitoring as outpatient with consideration of catheter ablation if recurrent episodes.  Pt has appt to follow up with cardiology on 8/9.    Acute on chronic diastolic and right venticular CHF: Above EDW of 123lbs on admission. BNP 2,262.7. Echo confirms RV systolic dysfunction, preserved LVEF, grade 1 diastolic CHF. PA peak pressure 8768m.  - Strict I/O - Daily weights - Lasix 98m47m BID is improving volume status, creatinine stable. Cardiology started him on bumex 2 mg BID and will discharge him home on this with close outpatient follow up already arranged.  - LE U/S ordered for LE edema, no DVT  Chronic hypoxic respiratory failure due to pulmonary fibrosis and pulmonary HTN: 4L at rest, 8L on exertion at baseline, followed in [facility]  [facility] clinic. No current evidence of exacerbation.  - Continue home therapies including 4L supplemental O2 - Continuing prednisone daily until follow up with Dr. Melvyn Novas 8/9 as scheduled.   Demand ischemia: Minimal and flat troponin elevation in setting of rapid heart rate. No chest pain.  - No ischemic evaluation planned per cardiology but close  outpatient follow up arranged.   Hypokalemia: Resolved. Mg nl.  - Monitored with diuresis, repeat labs in 1 week with PCP  Leukocytosis - suspect secondary to steroids, no s/s of infection found.   RLQ Abd pain - Pt reports pain has resolved now.   T2DM: Well-controlled with HbA1c 6.9%.  - With steroids, has had intermittent impaired glucose tolerance (fasting glucose remains well-controlled) - Change metformin and [facility] to sensitive SSI.   CBG (last 3)   Recent Labs  05/05/17 1536 05/05/17 2128 05/06/17 0738  GLUCAP 201* 128* 93   Stage III CKD: Scr much improved, now 0.95 - Improving below putative baseline with diuresis. Continue monitoring.  - Minimize nephrotoxins  Tobacco use: Restarted smoking 2 weeks ago after 37 years abstinence.  - Strongly emphasized cessation.   DVT prophylaxis: Eliquis Code Status: Full Family Communication: None at bedside Disposition Plan: DC home.  Consultants:   Cardiology   Procedures:   Echocardiogram 05/02/2017: - Left ventricle: The cavity size was mildly reduced. Systolic function was normal. The estimated ejection fraction was in therange of 55% to 60%. Wall motion was normal; there were no regional wall motion abnormalities. Doppler parameters areconsistent with abnormal left ventricular relaxation (grade 1 diastolic dysfunction). - Ventricular septum: The contour showed diastolic flattening andsystolic flattening. These changes are consistent with RV volume and pressure overload. - Aortic valve: There was very mild stenosis. Valve area (VTI):1.85 cm^2. Valve area (Vmax): 1.61 cm^2. Valve area (Vmean): 1.5 cm^2. - Right ventricle: The cavity size was severely dilated. Systolicfunction was moderately to severely reduced. - Right atrium: The atrium was severely dilated. - Tricuspid valve: There was moderate-severe regurgitation directedeccentrically. - Pulmonary arteries: Systolic pressure was severely  increased. PApeak pressure: 87 mm Hg (S).  Antimicrobials:  None    Discharge Diagnoses:  Principal Problem:   Atrial tachycardia ([place]) Active Problems:   CHF exacerbation (HCC)   Tachycardia   Hypokalemia   Hyperglycemia   Atrial flutter (HCC)   Pulmonary HTN (HCC)   Positive D-dimer  Discharge Instructions: Discharge Instructions    Increase activity slowly    Complete by:  As directed      Allergies as of 05/06/2017      Reactions   Lovastatin Rash      Medication List    STOP taking these medications   aspirin 81 MG tablet   furosemide 20 MG tablet Commonly known as:  LASIX   furosemide 40 MG tablet Commonly known as:  LASIX   glipiZIDE-metformin 2.5-500 MG tablet Commonly known as:  METAGLIP     TAKE these medications   acetaminophen 325 MG tablet Commonly known as:  TYLENOL Take 325 mg by mouth every 6 (six) hours as needed for mild pain or headache.   apixaban 5 MG Tabs tablet Commonly known as:  ELIQUIS Take 1 tablet (5 mg total) by mouth 2 (two) times daily.   atorvastatin 20 MG tablet Commonly known as:  LIPITOR Take 20 mg by mouth daily.   bumetanide 2 MG tablet Commonly known as:  BUMEX Take 1 tablet (2 mg total) by mouth 2 (two) times daily.   CALCIUM 600+D PLUS MINERALS  PO Take 1 tablet by mouth 2 (two) times daily.   dextromethorphan-guaiFENesin 30-600 MG 12hr tablet Commonly known as:  MUCINEX DM Take 1 tablet by mouth 2 (two) times daily.   diltiazem 120 MG 24 hr capsule Commonly known as:  CARDIZEM CD Take 1 capsule (120 mg total) by mouth daily.   famotidine 20 MG tablet Commonly known as:  PEPCID Take 1 tablet (20 mg total) by mouth at bedtime.   feeding supplement (PRO-STAT SUGAR FREE 64) Liqd Take 30 mLs by mouth 2 (two) times daily.   loratadine 10 MG tablet Commonly known as:  CLARITIN Take 10 mg by mouth daily.   metFORMIN 500 MG 24 hr tablet Commonly known as:  GLUCOPHAGE XR Take 2 tabs with breakfast  and 2 tabs with supper   OXYGEN Inhale 4-8 L into the lungs See admin instructions. 4 liters CONTINUOUSLY and to titrate up to 8 liters CONTINUOUSLY (with exertion) "to keep sats above 90" ([employer])   pantoprazole 40 MG tablet Commonly known as:  PROTONIX TAKE 1 TABLET BY MOUTH EVERY DAY *TAKE 30-60 MINUTES BEFORE FIRST MEAL OF THE DAY*   potassium chloride SA 20 MEQ tablet Commonly known as:  K-DUR,KLOR-CON Take 1 tablet (20 mEq total) by mouth daily.   predniSONE 10 MG tablet Commonly known as:  DELTASONE Take 2 tablets (20 mg total) by mouth daily with breakfast. Until FU with Pulmonary MD   terazosin 2 MG capsule Commonly known as:  HYTRIN Take 2 mg by mouth at bedtime.      Follow-up Information    Tanda Rockers, MD. Go on 05/27/2017.   Specialty:  Pulmonary Disease Why:  as scheduled Contact information: [address] [address] [phone]        Leonard Downing, MD. Schedule an appointment as soon as possible for a visit in 1 week(s).   Specialty:  Family Medicine Why:  Hospital Follow Up and check labs Contact information: [place] [address] [phone]        Pixie Casino, MD. Go on 05/27/2017.   Specialty:  Cardiology Why:  as scheduled Contact information: [address] [place] [address] [phone]          Allergies  Allergen Reactions  . Lovastatin Rash   Current Discharge Medication List    START taking these medications   Details  Amino Acids-Protein Hydrolys (FEEDING SUPPLEMENT, PRO-STAT SUGAR FREE 64,) LIQD Take 30 mLs by mouth 2 (two) times daily. Qty: 900 mL, Refills: 0    apixaban (ELIQUIS) 5 MG TABS tablet Take 1 tablet (5 mg total) by mouth 2 (two) times daily. Qty: 60 tablet, Refills: 0    bumetanide (BUMEX) 2 MG tablet Take 1 tablet (2 mg total) by mouth 2 (two) times daily. Qty: 60 tablet, Refills: 0    diltiazem (CARDIZEM CD) 120 MG 24 hr capsule  Take 1 capsule (120 mg total) by mouth daily. Qty: 30 capsule, Refills: 0    metFORMIN (GLUCOPHAGE XR) 500 MG 24 hr tablet Take 2 tabs with breakfast and 2 tabs with supper Qty: 120 tablet, Refills: 0      CONTINUE these medications which have NOT CHANGED   Details  acetaminophen (TYLENOL) 325 MG tablet Take 325 mg by mouth every 6 (six) hours as needed for mild pain or headache.     atorvastatin (LIPITOR) 20 MG tablet Take 20 mg by mouth daily.    Calcium Carbonate-Vit D-Min (CALCIUM  600+D PLUS MINERALS PO) Take 1 tablet by mouth 2 (two) times daily.    dextromethorphan-guaiFENesin (MUCINEX DM) 30-600 MG 12hr tablet Take 1 tablet by mouth 2 (two) times daily.     famotidine (PEPCID) 20 MG tablet Take 1 tablet (20 mg total) by mouth at bedtime. Qty: 90 tablet, Refills: 1    loratadine (CLARITIN) 10 MG tablet Take 10 mg by mouth daily.    OXYGEN Inhale 4-8 L into the lungs See admin instructions. 4 liters CONTINUOUSLY and to titrate up to 8 liters CONTINUOUSLY (with exertion) "to keep sats above 90" ([employer])    pantoprazole (PROTONIX) 40 MG tablet TAKE 1 TABLET BY MOUTH EVERY DAY *TAKE 30-60 MINUTES BEFORE FIRST MEAL OF THE DAY* Qty: 30 tablet, Refills: 11    potassium chloride SA (K-DUR,KLOR-CON) 20 MEQ tablet Take 1 tablet (20 mEq total) by mouth daily. Qty: 30 tablet, Refills: 0    predniSONE (DELTASONE) 10 MG tablet Take 2 tablets (20 mg total) by mouth daily with breakfast. Until FU with Pulmonary MD Qty: 30 tablet, Refills: 0    terazosin (HYTRIN) 2 MG capsule Take 2 mg by mouth at bedtime.      STOP taking these medications     aspirin 81 MG tablet      furosemide (LASIX) 40 MG tablet      glipiZIDE-metformin (METAGLIP) 2.5-500 MG tablet      furosemide (LASIX) 20 MG tablet         Procedures/Studies: Dg Chest 2 View  Result Date: 04/12/2017 CLINICAL DATA:  Follow-up pulmonary hypertension and previous infiltrative changes EXAM: CHEST  2 VIEW COMPARISON:   03/02/2017 FINDINGS: Cardiac shadow is again mildly enlarged. Lungs are well aerated bilaterally. Stable patchy scarring is noted throughout both lungs worse on the left than the right. No sizable effusion is seen. No bony abnormality is noted. IMPRESSION: Stable chronic changes in the bases bilaterally. No acute abnormality noted. Electronically Signed   By: Inez Catalina M.D.   On: 04/12/2017 09:22   Ct Angio Chest Pe W Or Wo Contrast  Result Date: 04/29/2017 CLINICAL DATA:  Shortness of breath with elevated D-dimer EXAM: CT ANGIOGRAPHY CHEST WITH CONTRAST TECHNIQUE: Multidetector CT imaging of the chest was performed using the standard protocol during bolus administration of intravenous contrast. Multiplanar CT image reconstructions and MIPs were obtained to evaluate the vascular anatomy. CONTRAST:  100 mL Isovue 370 intravenous COMPARISON:  04/28/2017, 02/25/2017, 12/08/2016 FINDINGS: Cardiovascular: Suboptimal opacification of the distal branch vessels, nondiagnostic for assessment of embolus particularly within the subsegmental lower lobe branch vessels. No central filling defect are visualized. Enlarged pulmonary artery trunk up to 3.4 cm suggestive of hypertension. Aortic atherosclerosis. Coronary artery calcification. Cardiomegaly. No significant effusion. Reflux of contrast into the hepatic veins consistent with elevated right heart pressure. Mediastinum/Nodes: Midline trachea. No thyroid mass. Nonspecific subcentimeter mediastinal lymph nodes. Esophagus unremarkable. Lungs/Pleura: Moderate emphysema. Mild bronchiectasis in the lower lobes with bilateral subpleural fibrosis. Partial consolidation in the right upper lobe. No pleural effusion. Upper Abdomen: No acute abnormality. Musculoskeletal: Degenerative changes. No acute or suspicious bone lesion. Review of the MIP images confirms the above findings. IMPRESSION: 1. Suboptimal study due to poor contrast opacification of distal branch vessels,  nondiagnostic for evaluation of subsegmental embolus in the bilateral lower lobes. There is no evidence for an acute central pulmonary embolus. 2. Cardiomegaly with enlarged main pulmonary vessels suggesting pulmonary artery hypertension. Reflux of contrast into the hepatic veins suggests elevated right heart pressures. 3. Moderate emphysema with  scattered areas of subpleural fibrosis and mild bronchiectasis in the lower lobes. Probable focal atelectasis in the right upper lobe. Aortic Atherosclerosis (ICD10-I70.0) and Emphysema (ICD10-J43.9). Electronically Signed   By: Donavan Foil M.D.   On: 04/29/2017 02:50   Nm Pulmonary Per & Vent  Result Date: 04/12/2017 CLINICAL DATA:  Pulmonary hypertension.  Rheumatoid arthritis. EXAM: NUCLEAR MEDICINE VENTILATION - PERFUSION LUNG SCAN TECHNIQUE: Ventilation images were obtained in multiple projections using inhaled aerosol Tc-54mDTPA. Perfusion images were obtained in multiple projections after intravenous injection of Tc-916mAA. RADIOPHARMACEUTICALS:  30.0 mCi Technetium-9975mPA aerosol inhalation and 4.0 mCi Technetium-7m76m IV COMPARISON:  Radiographs 04/12/2017.  CT 02/25/2017. FINDINGS: Ventilation: Patchy decreased ventilation to both lungs, especially on the left. There is mild central clumping of the aerosol. Perfusion: Patchy perfusion to both lungs is similar to the ventilatory examination. Overall perfusion appears more uniform than ventilation. No wedge shaped peripheral perfusion defects to suggest acute pulmonary embolism. IMPRESSION: Low probability for acute pulmonary embolism. Pattern does not suggest chronic thromboembolic disease either. Electronically Signed   By: WillRichardean Sale.   On: 04/12/2017 11:01   Dg Chest Port 1 View  Result Date: 04/28/2017 CLINICAL DATA:  Generalized weakness and dyspnea. EXAM: PORTABLE CHEST 1 VIEW COMPARISON:  04/12/2017.  02/24/2017.  11/19/2015. FINDINGS: 1645 hours. Low lung volumes. Diffuse  interstitial and patchy basilar airspace opacity is similar to prior and compatible with chronic underlying lung disease given the relative stability. The cardio pericardial silhouette is enlarged. The visualized bony structures of the thorax are intact. Telemetry leads overlie the chest. . IMPRESSION: 1. Stable appearance of chronic interstitial lung disease. 2. No new or progressive findings. Electronically Signed   By: EricMisty Stanley.   On: 04/28/2017 16:57      Subjective: Pt without complaints, says he would like to go home.    Discharge Exam: Vitals:   05/05/17 2133 05/06/17 0500  BP: 114/87 115/83  Pulse: 72 65  Resp: 18 12  Temp: 97.6 F (36.4 C) (!) 97.4 F (36.3 C)   Vitals:   05/05/17 1345 05/05/17 1535 05/05/17 2133 05/06/17 0500  BP: 105/74 108/77 114/87 115/83  Pulse: 75 62 72 65  Resp: 20 (!) _0 Temp: 98.3 F (36.8 C) (!) 97.5 F (36.4 C) 97.6 F (36.4 C) (!) 97.4 F (36.3 C)  TempSrc: Oral Axillary Axillary Axillary  SpO2: 98% 100% 92% 100%  Weight:      Height:       General: Pt is alert, awake, not in acute distress Cardiovascular: RRR, S1/S2 +, no rubs, no gallops Respiratory: CTA bilaterally, no wheezing, no rhonchi Abdominal: Soft, NT, ND, bowel sounds + Extremities: trace edema, no cyanosis   The results of significant diagnostics from this hospitalization (including imaging, microbiology, ancillary and laboratory) are listed below for reference.     Microbiology: Recent Results (from the past 240 hour(s))  MRSA PCR Screening     Status: None   Collection Time: 04/28/17 11:02 PM  Result Value Ref Range Status   MRSA by PCR NEGATIVE NEGATIVE Final    Comment:        The GeneXpert MRSA Assay (FDA approved for NASAL specimens only), is one component of a comprehensive MRSA colonization surveillance program. It is not intended to diagnose MRSA infection nor to guide or monitor treatment for MRSA infections.      Labs: BNP (last  3 results)  Recent Labs  12/08/16 0406 02/24/17 1939 04/28/17 1725  BNP 1,131.1* 2,452.3* 7,628.3*   Basic Metabolic Panel:  Recent Labs Lab 05/01/17 0308 05/02/17 0307 05/03/17 0305 05/04/17 0251 05/05/17 0311 05/06/17 0223  NA 140 140 138 136 140 141  K 3.5 3.1* 3.5 4.5 3.8 3.6  CL 103 103 99* 100* 98* 99*  CO2 30 30 33* 31 31 33*  GLUCOSE 47* 46* 67 84 100* 126*  BUN 35* 32* 25* 30* 32* 33*  CREATININE 0.95 0.86 0.87 0.89 0.95 0.87  CALCIUM 9.0 8.8* 8.4* 9.0 9.5 9.0  MG 2.0  --  2.1  --   --   --    Liver Function Tests:  Recent Labs Lab 05/01/17 0308  AST 80*  ALT 48  ALKPHOS 116  BILITOT 1.0  PROT 5.9*  ALBUMIN 2.8*   No results for input(s): LIPASE, AMYLASE in the last 168 hours. No results for input(s): AMMONIA in the last 168 hours. CBC:  Recent Labs Lab 05/01/17 0308 05/03/17 0305 05/05/17 0311 05/06/17 0223  WBC 10.2 10.9* 14.7* 15.7*  HGB 13.9 12.8* 14.1 13.3  HCT 43.4 40.8 44.4 41.5  MCV 93.1 93.8 93.3 93.3  PLT 251 255 374 305   Cardiac Enzymes: No results for input(s): CKTOTAL, CKMB, CKMBINDEX, TROPONINI in the last 168 hours. BNP: Invalid input(s): POCBNP CBG:  Recent Labs Lab 05/05/17 0749 05/05/17 1134 05/05/17 1536 05/05/17 2128 05/06/17 0738  GLUCAP 75 155* 201* 128* 93   D-Dimer No results for input(s): DDIMER in the last 72 hours. Hgb A1c No results for input(s): HGBA1C in the last 72 hours. Lipid Profile No results for input(s): CHOL, HDL, LDLCALC, TRIG, CHOLHDL, LDLDIRECT in the last 72 hours. Thyroid function studies No results for input(s): TSH, T4TOTAL, T3FREE, THYROIDAB in the last 72 hours.  Invalid input(s): FREET3 Anemia work up No results for input(s): VITAMINB12, FOLATE, FERRITIN, TIBC, IRON, RETICCTPCT in the last 72 hours. Urinalysis    Component Value Date/Time   COLORURINE COLORLESS (A) 12/08/2016 0604   APPEARANCEUR CLEAR 12/08/2016 0604   LABSPEC 1.006 12/08/2016 0604   PHURINE 6.0  12/08/2016 0604   GLUCOSEU NEGATIVE 12/08/2016 0604   HGBUR NEGATIVE 12/08/2016 0604   BILIRUBINUR NEGATIVE 12/08/2016 0604   KETONESUR NEGATIVE 12/08/2016 0604   PROTEINUR NEGATIVE 12/08/2016 0604   NITRITE NEGATIVE 12/08/2016 0604   LEUKOCYTESUR NEGATIVE 12/08/2016 0604   Sepsis Labs Invalid input(s): PROCALCITONIN,  WBC,  LACTICIDVEN Microbiology Recent Results (from the past 240 hour(s))  MRSA PCR Screening     Status: None   Collection Time: 04/28/17 11:02 PM  Result Value Ref Range Status   MRSA by PCR NEGATIVE NEGATIVE Final    Comment:        The GeneXpert MRSA Assay (FDA approved for NASAL specimens only), is one component of a comprehensive MRSA colonization surveillance program. It is not intended to diagnose MRSA infection nor to guide or monitor treatment for MRSA infections.    Time coordinating discharge: 35 minutes  SIGNED:  Irwin Brakeman, MD  [facility] 05/06/2017, 11:26 AM Pager [phone]  If 7PM-7AM, please contact night-coverage [email or web address] Password [email or web address]

## 2017-05-09 ENCOUNTER — Emergency Department (HOSPITAL_COMMUNITY)
Admission: EM | Admit: 2017-05-09 | Discharge: 2017-05-09 | Disposition: A | Discharge status: Home / Self Care | Payer: Medicare Other | Attending: Emergency Medicine | Admitting: Emergency Medicine

## 2017-05-09 ENCOUNTER — Emergency Department (HOSPITAL_COMMUNITY): Payer: Medicare Other

## 2017-05-09 ENCOUNTER — Encounter (HOSPITAL_COMMUNITY): Payer: Self-pay | Admitting: Emergency Medicine

## 2017-05-09 DIAGNOSIS — I130 Hypertensive heart and chronic kidney disease with heart failure and stage 1 through stage 4 chronic kidney disease, or unspecified chronic kidney disease: Secondary | ICD-10-CM | POA: Insufficient documentation

## 2017-05-09 DIAGNOSIS — Z79899 Other long term (current) drug therapy: Secondary | ICD-10-CM | POA: Diagnosis not present

## 2017-05-09 DIAGNOSIS — Z7901 Long term (current) use of anticoagulants: Secondary | ICD-10-CM | POA: Insufficient documentation

## 2017-05-09 DIAGNOSIS — Z7984 Long term (current) use of oral hypoglycemic drugs: Secondary | ICD-10-CM | POA: Diagnosis not present

## 2017-05-09 DIAGNOSIS — Z9981 Dependence on supplemental oxygen: Secondary | ICD-10-CM | POA: Diagnosis not present

## 2017-05-09 DIAGNOSIS — R531 Weakness: Secondary | ICD-10-CM

## 2017-05-09 DIAGNOSIS — N183 Chronic kidney disease, stage 3 (moderate): Secondary | ICD-10-CM | POA: Insufficient documentation

## 2017-05-09 DIAGNOSIS — Z87891 Personal history of nicotine dependence: Secondary | ICD-10-CM | POA: Insufficient documentation

## 2017-05-09 DIAGNOSIS — I5033 Acute on chronic diastolic (congestive) heart failure: Secondary | ICD-10-CM | POA: Diagnosis not present

## 2017-05-09 LAB — BASIC METABOLIC PANEL
Anion gap: 9 (ref 5–15)
BUN: 45 mg/dL — AB (ref 6–20)
CHLORIDE: 94 mmol/L — AB (ref 101–111)
CO2: 32 mmol/L (ref 22–32)
CREATININE: 1.14 mg/dL (ref 0.61–1.24)
Calcium: 9.2 mg/dL (ref 8.9–10.3)
GFR calc non Af Amer: >60 mL/min (ref >60)
GLUCOSE: 94 mg/dL (ref 65–99)
Potassium: 4 mmol/L (ref 3.5–5.1)
Sodium: 135 mmol/L (ref 135–145)

## 2017-05-09 LAB — I-STAT TROPONIN, ED: Troponin i, poc: 0.09 ng/mL (ref 0.00–0.08)

## 2017-05-09 LAB — ETHANOL: Alcohol, Ethyl (B): <5 mg/dL (ref <5)

## 2017-05-09 LAB — URINALYSIS, ROUTINE W REFLEX MICROSCOPIC
Bilirubin Urine: NEGATIVE
Glucose, UA: NEGATIVE mg/dL
Ketones, ur: NEGATIVE mg/dL
Leukocytes, UA: NEGATIVE
Nitrite: NEGATIVE
PROTEIN: NEGATIVE mg/dL
pH: 6 (ref 5.0–8.0)

## 2017-05-09 LAB — BRAIN NATRIURETIC PEPTIDE: B NATRIURETIC PEPTIDE 5: 2945.2 pg/mL — AB (ref 0.0–100.0)

## 2017-05-09 LAB — RAPID URINE DRUG SCREEN, HOSP PERFORMED
AMPHETAMINES: NOT DETECTED
BARBITURATES: NOT DETECTED
Benzodiazepines: NOT DETECTED
COCAINE: NOT DETECTED
Opiates: NOT DETECTED
Tetrahydrocannabinol: NOT DETECTED

## 2017-05-09 LAB — CBC
HCT: 37.9 % — ABNORMAL LOW (ref 39.0–52.0)
Hemoglobin: 12.6 g/dL — ABNORMAL LOW (ref 13.0–17.0)
MCH: 29.7 pg (ref 26.0–34.0)
MCHC: 33.2 g/dL (ref 30.0–36.0)
MCV: 89.4 fL (ref 78.0–100.0)
PLATELETS: 286 10*3/uL (ref 150–400)
RBC: 4.24 MIL/uL (ref 4.22–5.81)
RDW: 17.1 % — ABNORMAL HIGH (ref 11.5–15.5)
WBC: 17.6 10*3/uL — ABNORMAL HIGH (ref 4.0–10.5)

## 2017-05-09 LAB — URINALYSIS, MICROSCOPIC (REFLEX): WBC, UA: NONE SEEN WBC/hpf (ref 0–5)

## 2017-05-09 NOTE — Discharge Instructions (Signed)
Your caregiver has seen you today because you are having problems with feelings of weakness, dizziness, and/or fatigue. Weakness has many different causes, some of which are common and others are very rare. Your caregiver has considered some of the most common causes of weakness and feels it is safe for you to go home and be observed. Not every illness or injury can be identified during an emergency department visit, thus follow-up with your primary healthcare provider is important. Medical conditions can also worsen, so it is also important to return immediately as directed below, or if you have other serious concerns develop.  RETURN IMMEDIATELY IF  you develop new shortness of breath, chest pain, fever, have difficulty moving parts of your body (new weakness, numbness, or incoordination), sudden change in speech, vision, swallowing, or understanding, faint or develop new dizziness, severe headache, become poorly responsive or have an altered mental status compared to baseline for you, new rash, abdominal pain, or bloody stools,  Return sooner also if you develop new problems for which you have not talked to your caregiver but you feel may be emergency medical conditions.

## 2017-05-09 NOTE — ED Triage Notes (Signed)
Pt presents to ER from home with [facility] for having weakness and numbness (decreased sensation) to the LLE; pt has pitting edema to BLE, L is warm to touch and red; EMS reports no other focal deficits, also when EMS personnel removed covers from patient to load him onto stretcher at house, pt reported improvement in weakness and numbness symptoms

## 2017-05-09 NOTE — ED Provider Notes (Signed)
[facility] DEPT Provider Note   CSN: [id number] Arrival date & time: 05/09/17  0424     History   Chief Complaint Chief Complaint  Patient presents with  . Weakness  . Leg Swelling    HPI Gilbert Dominguez is a 71 y.o. male.  The history is provided by the patient and the spouse.  Weakness  This is a new problem. Episode onset: several hours ago. The problem has been rapidly improving. There was no focality noted. There has been no fever. Pertinent negatives include no headaches. Associated medical issues do not include trauma.  Patient with multiple medical problems including pulmonary fibrisis (on home oxygen) CHF, atrial flutter, presents with generalized weakness He reports over pas 24 hours he has generalized weakness - he reports he first noticed it when he as at the [employer]  in [place] to have a "few drinks" and his legs felt weak and he sat down, no trauma reported No syncope reproted Yesterday he felt "chills" but had normal temp (98) but did have fatigue No new CP.  No new SOB He has frequent pleurisy which is not new He woke up tonight and reports he had difficulty moving in bed Wife confirms he appeared to have generalized weakness but this is all resolved He reports chronic numbness to bilateral LE for weeks No new back pain He reports chronic leg swelling and erythema to left LE with recent negative DVT study  EMS did report there was some hypoxia while on home oxygen but improved on EMS oxygen  Past Medical History:  Diagnosis Date  . CHF exacerbation ([place]) 02/24/2017  . Chronic respiratory failure ([place])   . Hypertension   . 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 Rheumatoid arthritis(714.0)     Patient Active Problem List   Diagnosis Date Noted  . Positive D-dimer   . Atrial flutter ([place])   . Pulmonary HTN ([place])   . Hypokalemia 04/28/2017  . Hyperglycemia 04/28/2017  . Atrial tachycardia ([place])   . Tachycardia 04/08/2017  . Acute on  chronic diastolic CHF (congestive heart failure) ([place])   . Pulmonary hypertension ([place])   . 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 CKD (chronic kidney disease) stage 3, GFR 30-59 ml/min 02/25/2017  . Malnutrition of moderate degree 02/25/2017  . CHF exacerbation ([place]) 02/24/2017  . 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 Acute on chronic respiratory failure ([place]) 12/08/2016  . Fluid overload 12/08/2016  . Elevated troponin 12/08/2016  . Hyperlipidemia 12/08/2016  . Anemia 12/08/2016  . Essential hypertension 11/19/2015  . Cough 09/05/2014  . Postinflammatory pulmonary fibrosis ([place]) 11/23/2013  . Dyspnea 11/23/2013  . Chronic respiratory failure with hypoxia ([place]) 11/23/2013    Past Surgical History:  Procedure Laterality Date  . ACNE CYST REMOVAL  1968  . [place] Medications    Prior to Admission medications   Medication Sig Start Date End Date Taking? Authorizing Provider  acetaminophen (TYLENOL) 325 MG tablet Take 325 mg by mouth every 6 (six) hours as needed for mild pain or headache.     [provider]  Amino Acids-Protein Hydrolys (FEEDING SUPPLEMENT, PRO-STAT SUGAR FREE 64,) LIQD Take 30 mLs by mouth 2 (two) times daily. 05/06/17   Johnson, Clanford L, MD  apixaban (ELIQUIS) 5 MG TABS tablet Take 1 tablet (5 mg total) by mouth 2 (two) times daily. 05/06/17   Murlean Iba, MD  atorvastatin (  LIPITOR) 20 MG tablet Take 20 mg by mouth daily.    [provider]  bumetanide (BUMEX) 2 MG tablet Take 1 tablet (2 mg total) by mouth 2 (two) times daily. 05/06/17   Johnson, Clanford L, MD  Calcium Carbonate-Vit D-Min (CALCIUM 600+D PLUS MINERALS PO) Take 1 tablet by mouth 2 (two) times daily.    [provider]  dextromethorphan-guaiFENesin (MUCINEX DM) 30-600 MG 12hr tablet Take 1 tablet by mouth 2 (two) times daily.     [provider]  diltiazem (CARDIZEM CD) 120  MG 24 hr capsule Take 1 capsule (120 mg total) by mouth daily. 05/06/17   Johnson, Clanford L, MD  famotidine (PEPCID) 20 MG tablet Take 1 tablet (20 mg total) by mouth at bedtime. 01/27/17   Tanda Rockers, MD  loratadine (CLARITIN) 10 MG tablet Take 10 mg by mouth daily.    [provider]  metFORMIN (GLUCOPHAGE XR) 500 MG 24 hr tablet Take 2 tabs with breakfast and 2 tabs with supper 05/06/17   Johnson, Clanford L, MD  OXYGEN Inhale 4-8 L into the lungs See admin instructions. 4 liters CONTINUOUSLY and to titrate up to 8 liters CONTINUOUSLY (with exertion) "to keep sats above 90" ([employer])    [provider]  pantoprazole (PROTONIX) 40 MG tablet TAKE 1 TABLET BY MOUTH EVERY DAY *TAKE 30-60 MINUTES BEFORE FIRST MEAL OF THE DAY* 06/29/16   Tanda Rockers, MD  potassium chloride SA (K-DUR,KLOR-CON) 20 MEQ tablet Take 1 tablet (20 mEq total) by mouth daily. 12/12/16   Bonnielee Haff, MD  predniSONE (DELTASONE) 10 MG tablet Take 2 tablets (20 mg total) by mouth daily with breakfast. Until FU with Pulmonary MD 03/04/17   Domenic Polite, MD  terazosin (HYTRIN) 2 MG capsule Take 2 mg by mouth at bedtime.    [provider]    Family History Family History  Problem Relation Age of Onset  . Heart disease Father   . Emphysema Father   . Liver cancer Mother     Social History Social History  Substance Use Topics  . Smoking status: Former Smoker    Packs/day: 1.50    Years: 37.00    Types: Cigarettes    Quit date: 10/14/1995  . Smokeless tobacco: Never Used  . Alcohol use 1.8 oz/week    3 Cans of beer per week     Comment: occasionally     Allergies   Lovastatin   Review of Systems Review of Systems  Constitutional: Negative for fever.  Musculoskeletal: Negative for back pain.  Neurological: Positive for weakness. Negative for headaches.  All other systems reviewed and are negative.    Physical Exam Updated Vital Signs BP 114/77   Pulse 65   Temp (!)  97.4 F (36.3 C) (Oral)   Resp 16   Wt 56.5 kg (124 lb 8 oz)   SpO2 100%   BMI 22.05 kg/m   Physical Exam  CONSTITUTIONAL: Chronically ill appearing, no distress HEAD: Normocephalic/atraumatic EYES: EOMI/PERRL ENMT: Mucous membranes moist NECK: supple no meningeal signs SPINE/BACK:entire spine nontender, No bruising/crepitance/stepoffs noted to spine CV: S1/S2 noted, murmur noted LUNGS: crackles bilaterally, no distress ABDOMEN: soft, nontender, no rebound or guarding, bowel sounds noted throughout abdomen, healed bruising throughout right abdominal wall, no tenderness NEURO: Pt is awake/alert/appropriate, moves all extremitiesx4.  No facial droop.  No arm/leg drift.  Equal power noted in bilateral LE EXTREMITIES: pulses normal/equal, full ROM, chronic LE edema noted with mild erythema to left LE.  Distal pulses equal/intact SKIN: warm, color normal PSYCH: no abnormalities of mood noted, alert and oriented to situation  ED Treatments / Results  Labs (all labs ordered are listed, but only abnormal results are displayed) Labs Reviewed  BASIC METABOLIC PANEL - Abnormal; Notable for the following:       Result Value   Chloride 94 (*)    BUN 45 (*)    All other components within normal limits  CBC - Abnormal; Notable for the following:    WBC 17.6 (*)    Hemoglobin 12.6 (*)    HCT 37.9 (*)    RDW 17.1 (*)    All other components within normal limits  BRAIN NATRIURETIC PEPTIDE - Abnormal; Notable for the following:    B Natriuretic Peptide 2,945.2 (*)    All other components within normal limits  URINALYSIS, ROUTINE W REFLEX MICROSCOPIC - Abnormal; Notable for the following:    Specific Gravity, Urine <1.005 (*)    Hgb urine dipstick TRACE (*)    All other components within normal limits  URINALYSIS, MICROSCOPIC (REFLEX) - Abnormal; Notable for the following:    Bacteria, UA RARE (*)    Squamous Epithelial / LPF 0-5 (*)    All other components within normal limits  I-STAT  TROPONIN, ED - Abnormal; Notable for the following:    Troponin i, poc 0.09 (*)    All other components within normal limits  ETHANOL  RAPID URINE DRUG SCREEN, HOSP PERFORMED    EKG  EKG Interpretation  Date/Time:  Sunday May 09 2017 04:25:29 EDT Ventricular Rate:  76 PR Interval:    QRS Duration: 110 QT Interval:  405 QTC Calculation: 456 R Axis:   157 Text Interpretation:  Sinus rhythm Atrial premature complex LAE, consider biatrial enlargement Right ventricular hypertrophy Confirmed by Ripley Fraise [phone]) on 05/09/2017 4:49:57 AM       Radiology Dg Chest 2 View  Result Date: 05/09/2017 CLINICAL DATA:  Edema.  Lower extremity edema. EXAM: CHEST  2 VIEW COMPARISON:  Radiographs 04/28/2017, CT 04/29/2017 FINDINGS: Stable cardiomegaly and mediastinal contours. Atherosclerosis of the thoracic aorta. Basilar prominent subpleural reticulation, with some left mid lung involvement consistent with chronic interstitial lung disease. No superimposed consolidation, pleural effusion or pneumothorax. No acute osseous abnormality. IMPRESSION: 1. No acute abnormality. 2. Chronic interstitial lung disease, similar to prior exam. 3. Thoracic aortic atherosclerosis.  Stable cardiomegaly. Electronically Signed   By: Jeb Levering M.D.   On: 05/09/2017 05:12    Procedures Procedures (including critical care time)  Medications Ordered in ED Medications - No data to display   Initial Impression / Assessment and Plan / ED Course  I have reviewed the triage vital signs and the nursing notes.  Pertinent labs & imaging results that were available during my care of the patient were reviewed by me and considered in my medical decision making (see chart for details).     6:19 AM Pt chronically ill, on home oxygen Here for essentially generalized weakness, though very difficult historian and took several conversations to actually determine what triggered EMS call  History no suggestive of  CVA No back pain to suggest myelopathy He has no focal weakness  7:29 AM Pt feels at baseline He ambulated and felt well He had hypoxia but did not boost his O2 tank while walking (usually uses at least 6 liters while ambulating) Again he feels at baseline Labs near baseline CXR negative No focal neuro deficits No new pain complaints  Will d/c  home Pt agreeable with plan Will see his PCP this week  Final Clinical Impressions(s) / ED Diagnoses   Final diagnoses:  Weakness    New Prescriptions New Prescriptions   No medications on file     Ripley Fraise, MD 05/09/17 [phone]

## 2017-05-09 NOTE — ED Notes (Addendum)
Pt ambulated with pulse ox and 4L O2 via [place]; pt ambulated without difficulty using a 4 point walker; pt denies weakness, dzziness, lightheadedness; pts O2 sat began at 94% and got down to 76% with activity; pt returned to bed and will continue to monitor

## 2017-05-10 ENCOUNTER — Ambulatory Visit (HOSPITAL_BASED_OUTPATIENT_CLINIC_OR_DEPARTMENT_OTHER): Payer: Medicare Other | Attending: Internal Medicine | Admitting: Cardiology

## 2017-05-10 VITALS — Ht 63.0 in | Wt 122.0 lb

## 2017-05-10 DIAGNOSIS — E119 Type 2 diabetes mellitus without complications: Secondary | ICD-10-CM | POA: Insufficient documentation

## 2017-05-10 DIAGNOSIS — I10 Essential (primary) hypertension: Secondary | ICD-10-CM | POA: Diagnosis not present

## 2017-05-10 DIAGNOSIS — I272 Pulmonary hypertension, unspecified: Secondary | ICD-10-CM

## 2017-05-10 DIAGNOSIS — R0683 Snoring: Secondary | ICD-10-CM | POA: Diagnosis not present

## 2017-05-10 DIAGNOSIS — I471 Supraventricular tachycardia: Secondary | ICD-10-CM | POA: Insufficient documentation

## 2017-05-10 DIAGNOSIS — I50810 Right heart failure, unspecified: Secondary | ICD-10-CM

## 2017-05-10 DIAGNOSIS — I493 Ventricular premature depolarization: Secondary | ICD-10-CM | POA: Insufficient documentation

## 2017-05-10 DIAGNOSIS — I509 Heart failure, unspecified: Secondary | ICD-10-CM | POA: Diagnosis not present

## 2017-05-10 DIAGNOSIS — I2729 Other secondary pulmonary hypertension: Secondary | ICD-10-CM

## 2017-05-10 DIAGNOSIS — G4761 Periodic limb movement disorder: Secondary | ICD-10-CM | POA: Diagnosis present

## 2017-05-11 NOTE — Procedures (Signed)
    Patient Name: Gilbert Dominguez, Gilbert Dominguez Date: 05/10/2017 Gender: Male D.O.B: 07-13-1946 Age (years): 70 Referring Provider: Nadean Corwin Hilty Height (inches): 63 Interpreting Physician: Fransico Him MD, ABSM Weight (lbs): 122 RPSGT: Carolin Coy BMI: 22 MRN: [id number] Neck Size: 13.0  CLINICAL INFORMATION Sleep Study Type: NPSG  Indication for sleep study: Congestive Heart Failure, Diabetes, Hypertension  Epworth Sleepiness Score: 9  SLEEP STUDY TECHNIQUE As per the AASM Manual for the Scoring of Sleep and Associated Events v2.3 (April 2016) with a hypopnea requiring 4% desaturations.  The channels recorded and monitored were frontal, central and occipital EEG, electrooculogram (EOG), submentalis EMG (chin), nasal and oral airflow, thoracic and abdominal wall motion, anterior tibialis EMG, snore microphone, electrocardiogram, and pulse oximetry.  MEDICATIONS Medications self-administered by patient taken the night of the study : N/A  SLEEP ARCHITECTURE The study was initiated at 11:32:51 PM and ended at 5:56:00 AM.  Sleep onset time was 31.7 minutes and the sleep efficiency was 80.2%. The total sleep time was 307.4 minutes.  Stage REM latency was 70.5 minutes.  The patient spent 12.52% of the night in stage N1 sleep, 73.49% in stage N2 sleep, 0.00% in stage N3 and 13.99% in REM.  Alpha intrusion was absent.  Supine sleep was 22.77%.  RESPIRATORY PARAMETERS The overall apnea/hypopnea index (AHI) was 1.8 per hour. There were 9 total apneas, including 6 obstructive, 0 central and 3 mixed apneas. There were 0 hypopneas and 6 RERAs.  The AHI during Stage REM sleep was 5.6 per hour.  AHI while supine was 2.6 per hour.  The mean oxygen saturation was 99.49%. The minimum SpO2 during sleep was 98.00%.  Soft snoring was noted during this study.  CARDIAC DATA The 2 lead EKG demonstrated sinus rhythm. The mean heart rate was 55.94 beats per minute. Other EKG findings  include: PVCs, PACs and nonsustained atrial tachycardia.  LEG MOVEMENT DATA The total PLMS were 376 with a resulting PLMS index of 73.38. Associated arousal with leg movement index was 10.5 .  IMPRESSIONS - No significant obstructive sleep apnea occurred during this study (AHI = 1.8/h). - No significant central sleep apnea occurred during this study (CAI = 0.0/h). - Moderate oxygen desaturation was noted during this study (Min O2 = 98.00%). - The patient snored with Soft snoring volume. - EKG findings include PVCs, PACs and nonsustained atrial tachycardia. - Severe periodic limb movements of sleep occurred during the study. Associated arousals were significant.  DIAGNOSIS - Periodic Limb Movement Disorder - PVCs - PACs - Nonsustained atrial tachycardia  RECOMMENDATIONS - Avoid alcohol, sedatives and other CNS depressants that may worsen sleep apnea and disrupt normal sleep architecture. - Sleep hygiene should be reviewed to assess factors that may improve sleep quality. - Weight management and regular exercise should be initiated or continued if appropriate. - The patient should be evaluated for symptoms of restless legs.   Fransico Him MD, ABSM Diplomate, American Board of Sleep Medicine

## 2017-05-11 NOTE — Progress Notes (Signed)
Study was negative for sleep apnea. Mild restless legs was noted.  Dr. Lemmie Evens

## 2017-05-13 ENCOUNTER — Telehealth: Payer: Self-pay | Admitting: *Deleted

## 2017-05-13 NOTE — Telephone Encounter (Signed)
LMTCB

## 2017-05-13 NOTE — Telephone Encounter (Signed)
-----  Message from Sueanne Margarita, MD sent at 05/11/2017  9:29 AM EDT ----- Please let patient know that sleep study showed no significant sleep apnea.  Patient does have periodic limb movement disorder so recommend evaluation for restless legs syndrome

## 2017-05-17 NOTE — Telephone Encounter (Signed)
Informed patient of sleep study results and patient understanding was verbalized. Patient understands his sleep study showed no sleep apnea but he does have periodic limb movement. Patient understands Dr Radford Pax recommends evaluation for restless legs syndrome.  A copy of the sleep study result has been sent to patients primary MD Leonard Downing for review Patient agrees to thanked me for the call

## 2017-05-24 ENCOUNTER — Observation Stay (HOSPITAL_COMMUNITY)
Admission: EM | Admit: 2017-05-24 | Discharge: 2017-05-26 | Disposition: A | Discharge status: Home / Self Care | Payer: Medicare Other | Attending: Internal Medicine | Admitting: Internal Medicine

## 2017-05-24 ENCOUNTER — Encounter (HOSPITAL_COMMUNITY): Payer: Self-pay | Admitting: Emergency Medicine

## 2017-05-24 ENCOUNTER — Emergency Department (HOSPITAL_COMMUNITY): Payer: Medicare Other

## 2017-05-24 DIAGNOSIS — I509 Heart failure, unspecified: Secondary | ICD-10-CM | POA: Diagnosis not present

## 2017-05-24 DIAGNOSIS — Z9981 Dependence on supplemental oxygen: Secondary | ICD-10-CM | POA: Diagnosis not present

## 2017-05-24 DIAGNOSIS — Z7984 Long term (current) use of oral hypoglycemic drugs: Secondary | ICD-10-CM | POA: Insufficient documentation

## 2017-05-24 DIAGNOSIS — M069 Rheumatoid arthritis, unspecified: Secondary | ICD-10-CM | POA: Diagnosis not present

## 2017-05-24 DIAGNOSIS — J9621 Acute and chronic respiratory failure with hypoxia: Secondary | ICD-10-CM | POA: Diagnosis not present

## 2017-05-24 DIAGNOSIS — J8410 Pulmonary fibrosis, unspecified: Secondary | ICD-10-CM | POA: Insufficient documentation

## 2017-05-24 DIAGNOSIS — I491 Atrial premature depolarization: Secondary | ICD-10-CM | POA: Insufficient documentation

## 2017-05-24 DIAGNOSIS — Z87891 Personal history of nicotine dependence: Secondary | ICD-10-CM | POA: Diagnosis not present

## 2017-05-24 DIAGNOSIS — Z8249 Family history of ischemic heart disease and other diseases of the circulatory system: Secondary | ICD-10-CM | POA: Insufficient documentation

## 2017-05-24 DIAGNOSIS — J9611 Chronic respiratory failure with hypoxia: Secondary | ICD-10-CM | POA: Diagnosis present

## 2017-05-24 DIAGNOSIS — I4892 Unspecified atrial flutter: Secondary | ICD-10-CM | POA: Insufficient documentation

## 2017-05-24 DIAGNOSIS — R0602 Shortness of breath: Secondary | ICD-10-CM | POA: Diagnosis present

## 2017-05-24 DIAGNOSIS — I471 Supraventricular tachycardia: Secondary | ICD-10-CM | POA: Insufficient documentation

## 2017-05-24 DIAGNOSIS — I5082 Biventricular heart failure: Secondary | ICD-10-CM | POA: Insufficient documentation

## 2017-05-24 DIAGNOSIS — E872 Acidosis, unspecified: Secondary | ICD-10-CM

## 2017-05-24 DIAGNOSIS — I130 Hypertensive heart and chronic kidney disease with heart failure and stage 1 through stage 4 chronic kidney disease, or unspecified chronic kidney disease: Secondary | ICD-10-CM | POA: Insufficient documentation

## 2017-05-24 DIAGNOSIS — Z7901 Long term (current) use of anticoagulants: Secondary | ICD-10-CM | POA: Insufficient documentation

## 2017-05-24 DIAGNOSIS — R7401 Elevation of levels of liver transaminase levels: Secondary | ICD-10-CM | POA: Diagnosis present

## 2017-05-24 DIAGNOSIS — N183 Chronic kidney disease, stage 3 unspecified: Secondary | ICD-10-CM | POA: Diagnosis present

## 2017-05-24 DIAGNOSIS — I2729 Other secondary pulmonary hypertension: Secondary | ICD-10-CM | POA: Insufficient documentation

## 2017-05-24 DIAGNOSIS — Z9852 Vasectomy status: Secondary | ICD-10-CM | POA: Insufficient documentation

## 2017-05-24 DIAGNOSIS — I4510 Unspecified right bundle-branch block: Secondary | ICD-10-CM | POA: Diagnosis not present

## 2017-05-24 DIAGNOSIS — J849 Interstitial pulmonary disease, unspecified: Secondary | ICD-10-CM

## 2017-05-24 DIAGNOSIS — E1122 Type 2 diabetes mellitus with diabetic chronic kidney disease: Secondary | ICD-10-CM | POA: Diagnosis not present

## 2017-05-24 DIAGNOSIS — R740 Nonspecific elevation of levels of transaminase and lactic acid dehydrogenase [LDH]: Secondary | ICD-10-CM | POA: Insufficient documentation

## 2017-05-24 DIAGNOSIS — E785 Hyperlipidemia, unspecified: Secondary | ICD-10-CM | POA: Insufficient documentation

## 2017-05-24 DIAGNOSIS — E440 Moderate protein-calorie malnutrition: Secondary | ICD-10-CM | POA: Diagnosis not present

## 2017-05-24 DIAGNOSIS — I2723 Pulmonary hypertension due to lung diseases and hypoxia: Secondary | ICD-10-CM | POA: Diagnosis present

## 2017-05-24 DIAGNOSIS — Z836 Family history of other diseases of the respiratory system: Secondary | ICD-10-CM | POA: Insufficient documentation

## 2017-05-24 DIAGNOSIS — Z800 Family history of malignant neoplasm of digestive organs: Secondary | ICD-10-CM | POA: Insufficient documentation

## 2017-05-24 DIAGNOSIS — E8729 Other acidosis: Secondary | ICD-10-CM | POA: Diagnosis present

## 2017-05-24 DIAGNOSIS — Z9889 Other specified postprocedural states: Secondary | ICD-10-CM | POA: Insufficient documentation

## 2017-05-24 DIAGNOSIS — N179 Acute kidney failure, unspecified: Secondary | ICD-10-CM

## 2017-05-24 NOTE — ED Provider Notes (Signed)
[facility] DEPT Provider Note   CSN: [id number] Arrival date & time: 05/24/17  2308     History   Chief Complaint Chief Complaint  Patient presents with  . Shortness of Breath    HPI Gilbert Dominguez is a 71 y.o. male.  HPI  Patient, with a past medical history of CHF, pulmonary fibrosis, hypertension presents to ED for evaluation of shortness of breath, dry cough for the past 8 hours. He states that despite the use of his home 4 L of oxygen he continues to have shortness of breath. He reports increasing oxygen up to 6-8 L when walking or exercising. He reports similar symptoms in the past. He reports chronic leg swelling as well as chronic cough.  He reports compliance with his home medications. States that his dry weight is 123 pounds. Denies any chest pain, vomiting, abdominal pain, diarrhea, constipation, blood in stool, loss of consciousness, lightheadedness, numbness, weakness.  EMS states that patient appeared anxious when changing his oxygen settings at home. However they stated that patient had normal lung sounds bilaterally. Once patient was placed on oxygen and EMS, states that he calmed down work of breathing improved.  Past Medical History:  Diagnosis Date  . CHF exacerbation ([place]) 02/24/2017  . Chronic respiratory failure ([place])   . Hypertension   . 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 Rheumatoid arthritis(714.0)     Patient Active Problem List   Diagnosis Date Noted  . Positive D-dimer   . Atrial flutter ([place])   . Pulmonary HTN ([place])   . Hypokalemia 04/28/2017  . Hyperglycemia 04/28/2017  . Atrial tachycardia ([place])   . Tachycardia 04/08/2017  . Acute on chronic diastolic CHF (congestive heart failure) ([place])   . Pulmonary hypertension ([place])   . 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 CKD (chronic kidney disease) stage 3, GFR 30-59 ml/min 02/25/2017  . Malnutrition of moderate degree 02/25/2017  . CHF exacerbation ([place])  02/24/2017  . 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 Acute on chronic respiratory failure ([place]) 12/08/2016  . Fluid overload 12/08/2016  . Elevated troponin 12/08/2016  . Hyperlipidemia 12/08/2016  . Anemia 12/08/2016  . Essential hypertension 11/19/2015  . Cough 09/05/2014  . Postinflammatory pulmonary fibrosis ([place]) 11/23/2013  . Dyspnea 11/23/2013  . Chronic respiratory failure with hypoxia ([place]) 11/23/2013    Past Surgical History:  Procedure Laterality Date  . ACNE CYST REMOVAL  1968  . [place] Medications    Prior to Admission medications   Medication Sig Start Date End Date Taking? Authorizing Provider  acetaminophen (TYLENOL) 325 MG tablet Take 325 mg by mouth every 6 (six) hours as needed for mild pain or headache.     [provider]  Amino Acids-Protein Hydrolys (FEEDING SUPPLEMENT, PRO-STAT SUGAR FREE 64,) LIQD Take 30 mLs by mouth 2 (two) times daily. 05/06/17   Johnson, Clanford L, MD  apixaban (ELIQUIS) 5 MG TABS tablet Take 1 tablet (5 mg total) by mouth 2 (two) times daily. 05/06/17   Johnson, Clanford L, MD  atorvastatin (LIPITOR) 20 MG tablet Take 20 mg by mouth daily.    [provider]  bumetanide (BUMEX) 2 MG tablet Take 1 tablet (2 mg total) by mouth 2 (two) times daily. 05/06/17   Johnson, Clanford L, MD  Calcium Carbonate-Vit D-Min (CALCIUM 600+D PLUS MINERALS PO) Take 1 tablet by mouth 2 (two) times daily.    [provider]  dextromethorphan-guaiFENesin (MUCINEX DM) 30-600 MG 12hr tablet Take 1 tablet by mouth 2 (two) times daily.     [provider]  diltiazem (CARDIZEM CD) 120 MG 24 hr capsule Take 1 capsule (120 mg total) by mouth daily. 05/06/17   Johnson, Clanford L, MD  famotidine (PEPCID) 20 MG tablet Take 1 tablet (20 mg total) by mouth at bedtime. 01/27/17   Tanda Rockers, MD  loratadine (CLARITIN) 10 MG tablet Take 10 mg by mouth daily.    [provider]  metFORMIN (GLUCOPHAGE XR) 500 MG 24 hr tablet Take 2 tabs with breakfast and 2 tabs with supper 05/06/17   Johnson, Clanford L, MD  OXYGEN Inhale 4-8 L into the lungs See admin instructions. 4 liters CONTINUOUSLY and to titrate up to 8 liters CONTINUOUSLY (with exertion) "to keep sats above 90" ([employer])    [provider]  pantoprazole (PROTONIX) 40 MG tablet TAKE 1 TABLET BY MOUTH EVERY DAY *TAKE 30-60 MINUTES BEFORE FIRST MEAL OF THE DAY* 06/29/16   Tanda Rockers, MD  potassium chloride SA (K-DUR,KLOR-CON) 20 MEQ tablet Take 1 tablet (20 mEq total) by mouth daily. 12/12/16   Bonnielee Haff, MD  predniSONE (DELTASONE) 10 MG tablet Take 2 tablets (20 mg total) by mouth daily with breakfast. Until FU with Pulmonary MD 03/04/17   Domenic Polite, MD  terazosin (HYTRIN) 2 MG capsule Take 2 mg by mouth at bedtime.    [provider]    Family History Family History  Problem Relation Age of Onset  . Heart disease Father   . Emphysema Father   . Liver cancer Mother     Social History Social History  Substance Use Topics  . Smoking status: Former Smoker    Packs/day: 1.50    Years: 37.00    Types: Cigarettes    Quit date: 10/14/1995  . Smokeless tobacco: Never Used  . Alcohol use 1.8 oz/week    3 Cans of beer per week     Comment: occasionally     Allergies   Lovastatin   Review of Systems Review of Systems  Constitutional: Negative for appetite change, chills and fever.  HENT: Negative for ear pain, rhinorrhea, sneezing and sore throat.   Eyes: Negative for photophobia and visual disturbance.  Respiratory: Positive for cough and shortness of breath. Negative for chest tightness and wheezing.   Cardiovascular: Negative for chest pain and palpitations.  Gastrointestinal: Negative for abdominal pain, blood in stool, constipation, diarrhea, nausea and vomiting.  Genitourinary: Positive for urgency. Negative for dysuria, frequency and hematuria.    Musculoskeletal: Negative for myalgias.  Skin: Negative for rash.  Neurological: Negative for dizziness, weakness and light-headedness.     Physical Exam Updated Vital Signs Ht _0  (1.6 m)   Wt 55.8 kg (123 lb)   SpO2 99%   BMI 21.79 kg/m   Physical Exam  Constitutional: He appears well-developed and well-nourished. No distress.  Thin, frail-appearing elderly man. Nasal cannula in place.  HENT:  Head: Normocephalic and atraumatic.  Nose: Nose normal.  Eyes: Conjunctivae and EOM are normal. Left eye exhibits no discharge. No scleral icterus.  Neck: Normal range of motion. Neck supple.  Cardiovascular: Normal rate, regular rhythm, normal heart sounds and intact distal pulses.  Exam reveals no gallop and no friction rub.   No murmur heard. Pulmonary/Chest: Effort normal and breath sounds normal. No respiratory distress.  Abdominal: Soft. Bowel sounds are normal. He exhibits no distension. There is no  tenderness. There is no guarding.  Musculoskeletal: Normal range of motion. He exhibits edema.  2+ pitting edema bilaterally. No color temperature change noted. 2+ DP pulses bilaterally. Strength 5/5 in bilateral lower extremities. Sensation intact to light touch.  Neurological: He is alert. He exhibits normal muscle tone. Coordination normal.  Skin: Skin is warm and dry. No rash noted.  Psychiatric: He has a normal mood and affect.  Nursing note and vitals reviewed.    ED Treatments / Results  Labs (all labs ordered are listed, but only abnormal results are displayed) Labs Reviewed  COMPREHENSIVE METABOLIC PANEL  CBC WITH DIFFERENTIAL/PLATELET  BRAIN NATRIURETIC PEPTIDE  URINALYSIS, ROUTINE W REFLEX MICROSCOPIC  I-STAT TROPONIN, ED    EKG  EKG Interpretation None       Radiology No results found.  Procedures Procedures (including critical care time)  Medications Ordered in ED Medications - No data to display   Initial Impression / Assessment and Plan / ED  Course  I have reviewed the triage vital signs and the nursing notes.  Pertinent labs & imaging results that were available during my care of the patient were reviewed by me and considered in my medical decision making (see chart for details).     Patient, with a past medical history of CHF, pulmonary fibrosis and hypertension presents to ED for evaluation of shortness of breath and dry cough. States symptoms began approximately 8 hours ago. He is on 4 L of oxygen at home and increases to 6-8 L when walking and exercising. Patient is currently on 4 L of oxygen here in the ED. Oxygen saturations 98%. He does not appear in respiratory distress. Normal heart rate and respiratory rate. On physical exam, lungs are clear to auscultation bilaterally. EKG showed similar tracings to prior. Chest x-ray showed possible multifocal pneumonia. CBC showed leukocytosis at 18.5. Troponin negative 1.  Patient will likely need to be admitted for multilobar pneumonia and SOB. Care signed out to oncoming provider pending remaining labwork.    Final Clinical Impressions(s) / ED Diagnoses   Final diagnoses:  None    New Prescriptions New Prescriptions   No medications on file     Delia Heady, PA-C 05/25/17 0117    Merryl Hacker, MD 05/31/17 [phone]

## 2017-05-24 NOTE — ED Triage Notes (Signed)
Pt BIB EMS from home. Reports incr'd SOB today around 1400. Hx of pul fibrosis, htn, chf. Became anxious while recharging his oxygen concentrator. Lung sounds clear per EMS. Once pt placed on EMS oxygen & calmed down, WOB improved.

## 2017-05-25 DIAGNOSIS — J9611 Chronic respiratory failure with hypoxia: Secondary | ICD-10-CM | POA: Diagnosis not present

## 2017-05-25 DIAGNOSIS — R740 Nonspecific elevation of levels of transaminase and lactic acid dehydrogenase [LDH]: Secondary | ICD-10-CM

## 2017-05-25 DIAGNOSIS — R7401 Elevation of levels of liver transaminase levels: Secondary | ICD-10-CM | POA: Diagnosis present

## 2017-05-25 DIAGNOSIS — I2723 Pulmonary hypertension due to lung diseases and hypoxia: Secondary | ICD-10-CM | POA: Diagnosis not present

## 2017-05-25 DIAGNOSIS — J849 Interstitial pulmonary disease, unspecified: Secondary | ICD-10-CM

## 2017-05-25 DIAGNOSIS — E872 Acidosis: Secondary | ICD-10-CM

## 2017-05-25 DIAGNOSIS — E8729 Other acidosis: Secondary | ICD-10-CM | POA: Diagnosis present

## 2017-05-25 DIAGNOSIS — N183 Chronic kidney disease, stage 3 (moderate): Secondary | ICD-10-CM | POA: Diagnosis not present

## 2017-05-25 LAB — CBC WITH DIFFERENTIAL/PLATELET
BAND NEUTROPHILS: 0 %
BASOS PCT: 0 %
Basophils Absolute: 0 10*3/uL (ref 0.0–0.1)
Blasts: 0 %
EOS ABS: 0 10*3/uL (ref 0.0–0.7)
Eosinophils Relative: 0 %
HEMATOCRIT: 44.1 % (ref 39.0–52.0)
Hemoglobin: 14.1 g/dL (ref 13.0–17.0)
LYMPHS ABS: 0.2 10*3/uL — AB (ref 0.7–4.0)
LYMPHS PCT: 1 %
MCH: 29.3 pg (ref 26.0–34.0)
MCHC: 32 g/dL (ref 30.0–36.0)
MCV: 91.5 fL (ref 78.0–100.0)
MONO ABS: 0.7 10*3/uL (ref 0.1–1.0)
MONOS PCT: 4 %
Metamyelocytes Relative: 0 %
Myelocytes: 0 %
NEUTROS ABS: 17.6 10*3/uL — AB (ref 1.7–7.7)
Neutrophils Relative %: 95 %
OTHER: 0 %
Platelets: 337 10*3/uL (ref 150–400)
Promyelocytes Absolute: 0 %
RBC: 4.82 MIL/uL (ref 4.22–5.81)
RDW: 17.7 % — AB (ref 11.5–15.5)
WBC: 18.5 10*3/uL — ABNORMAL HIGH (ref 4.0–10.5)
nRBC: 0 /100 WBC

## 2017-05-25 LAB — LACTIC ACID, PLASMA
Lactic Acid, Venous: 2.9 mmol/L (ref 0.5–1.9)
Lactic Acid, Venous: 1.8 mmol/L (ref 0.5–1.9)

## 2017-05-25 LAB — COMPREHENSIVE METABOLIC PANEL
ALBUMIN: 3.5 g/dL (ref 3.5–5.0)
ALK PHOS: 136 U/L — AB (ref 38–126)
ALT: 253 U/L — ABNORMAL HIGH (ref 17–63)
ALT: 1544 U/L — AB (ref 17–63)
ANION GAP: 23 — AB (ref 5–15)
AST: 335 U/L — ABNORMAL HIGH (ref 15–41)
AST: 2743 U/L — AB (ref 15–41)
Albumin: 2.7 g/dL — ABNORMAL LOW (ref 3.5–5.0)
Alkaline Phosphatase: 123 U/L (ref 38–126)
Anion gap: 13 (ref 5–15)
BUN: 41 mg/dL — ABNORMAL HIGH (ref 6–20)
BUN: 40 mg/dL — AB (ref 6–20)
CHLORIDE: 94 mmol/L — AB (ref 101–111)
CHLORIDE: 95 mmol/L — AB (ref 101–111)
CO2: 16 mmol/L — AB (ref 22–32)
CO2: 23 mmol/L (ref 22–32)
CREATININE: 1.37 mg/dL — AB (ref 0.61–1.24)
Calcium: 9.4 mg/dL (ref 8.9–10.3)
Calcium: 8.4 mg/dL — ABNORMAL LOW (ref 8.9–10.3)
Creatinine, Ser: 1.68 mg/dL — ABNORMAL HIGH (ref 0.61–1.24)
GFR calc Af Amer: 45 mL/min — ABNORMAL LOW (ref >60)
GFR calc Af Amer: 58 mL/min — ABNORMAL LOW (ref >60)
GFR calc non Af Amer: 39 mL/min — ABNORMAL LOW (ref >60)
GFR calc non Af Amer: 50 mL/min — ABNORMAL LOW (ref >60)
GLUCOSE: 93 mg/dL (ref 65–99)
Glucose, Bld: 151 mg/dL — ABNORMAL HIGH (ref 65–99)
POTASSIUM: 5 mmol/L (ref 3.5–5.1)
Potassium: 3.9 mmol/L (ref 3.5–5.1)
SODIUM: 131 mmol/L — AB (ref 135–145)
SODIUM: 133 mmol/L — AB (ref 135–145)
Total Bilirubin: 3.2 mg/dL — ABNORMAL HIGH (ref 0.3–1.2)
Total Bilirubin: 3.1 mg/dL — ABNORMAL HIGH (ref 0.3–1.2)
Total Protein: 7.4 g/dL (ref 6.5–8.1)
Total Protein: 5.3 g/dL — ABNORMAL LOW (ref 6.5–8.1)

## 2017-05-25 LAB — URINALYSIS, ROUTINE W REFLEX MICROSCOPIC
BILIRUBIN URINE: NEGATIVE
Glucose, UA: NEGATIVE mg/dL
Ketones, ur: NEGATIVE mg/dL
LEUKOCYTES UA: NEGATIVE
Nitrite: NEGATIVE
Protein, ur: 100 mg/dL — AB
SPECIFIC GRAVITY, URINE: 1.011 (ref 1.005–1.030)
SQUAMOUS EPITHELIAL / LPF: NONE SEEN
pH: 5 (ref 5.0–8.0)

## 2017-05-25 LAB — GLUCOSE, CAPILLARY
GLUCOSE-CAPILLARY: 90 mg/dL (ref 65–99)
GLUCOSE-CAPILLARY: 166 mg/dL — AB (ref 65–99)
GLUCOSE-CAPILLARY: 237 mg/dL — AB (ref 65–99)
GLUCOSE-CAPILLARY: 133 mg/dL — AB (ref 65–99)
Glucose-Capillary: 97 mg/dL (ref 65–99)

## 2017-05-25 LAB — BASIC METABOLIC PANEL
Anion gap: 13 (ref 5–15)
BUN: 42 mg/dL — ABNORMAL HIGH (ref 6–20)
CALCIUM: 8.7 mg/dL — AB (ref 8.9–10.3)
CO2: 23 mmol/L (ref 22–32)
CREATININE: 1.46 mg/dL — AB (ref 0.61–1.24)
Chloride: 95 mmol/L — ABNORMAL LOW (ref 101–111)
GFR, EST AFRICAN AMERICAN: 54 mL/min — AB (ref >60)
GFR, EST NON AFRICAN AMERICAN: 46 mL/min — AB (ref >60)
Glucose, Bld: 130 mg/dL — ABNORMAL HIGH (ref 65–99)
Potassium: 3.9 mmol/L (ref 3.5–5.1)
SODIUM: 131 mmol/L — AB (ref 135–145)

## 2017-05-25 LAB — I-STAT TROPONIN, ED: TROPONIN I, POC: 0.07 ng/mL (ref 0.00–0.08)

## 2017-05-25 LAB — I-STAT VENOUS BLOOD GAS, ED
Acid-Base Excess: 2 mmol/L (ref 0.0–2.0)
Bicarbonate: 25.9 mmol/L (ref 20.0–28.0)
O2 Saturation: 78 %
PCO2 VEN: 37.2 mmHg — AB (ref 44.0–60.0)
PH VEN: 7.45 — AB (ref 7.250–7.430)
TCO2: 27 mmol/L (ref 0–100)
pO2, Ven: 40 mmHg (ref 32.0–45.0)

## 2017-05-25 LAB — BRAIN NATRIURETIC PEPTIDE: B Natriuretic Peptide: 2092.3 pg/mL — ABNORMAL HIGH (ref 0.0–100.0)

## 2017-05-25 MED ORDER — FAMOTIDINE 20 MG PO TABS
20.0000 mg | ORAL_TABLET | Freq: Every day | ORAL | Status: DC
  Administered 2017-05-25: 20 mg via ORAL
  Filled 2017-05-25: qty 1

## 2017-05-25 MED ORDER — APIXABAN 5 MG PO TABS
5.0000 mg | ORAL_TABLET | Freq: Two times a day (BID) | ORAL | Status: DC
  Administered 2017-05-25 – 2017-05-26 (×3): 5 mg via ORAL
  Filled 2017-05-25 (×3): qty 1

## 2017-05-25 MED ORDER — ONDANSETRON HCL 4 MG/2ML IJ SOLN: 4.0000 mg | Freq: Four times a day (QID) | INTRAMUSCULAR | Status: DC | PRN

## 2017-05-25 MED ORDER — PREDNISONE 20 MG PO TABS
20.0000 mg | ORAL_TABLET | Freq: Every day | ORAL | Status: DC
  Administered 2017-05-25 – 2017-05-26 (×2): 20 mg via ORAL
  Filled 2017-05-25 (×2): qty 1

## 2017-05-25 MED ORDER — DM-GUAIFENESIN ER 30-600 MG PO TB12
1.0000 | ORAL_TABLET | Freq: Two times a day (BID) | ORAL | Status: DC
  Administered 2017-05-25 – 2017-05-26 (×3): 1 via ORAL
  Filled 2017-05-25 (×3): qty 1

## 2017-05-25 MED ORDER — ACETAMINOPHEN 325 MG PO TABS: 650.0000 mg | ORAL_TABLET | Freq: Four times a day (QID) | ORAL | Status: DC | PRN

## 2017-05-25 MED ORDER — VANCOMYCIN HCL IN DEXTROSE 1-5 GM/200ML-% IV SOLN
1000.0000 mg | Freq: Once | INTRAVENOUS | Status: AC
  Administered 2017-05-25: 1000 mg via INTRAVENOUS
  Filled 2017-05-25: qty 200

## 2017-05-25 MED ORDER — ONDANSETRON HCL 4 MG PO TABS: 4.0000 mg | ORAL_TABLET | Freq: Four times a day (QID) | ORAL | Status: DC | PRN

## 2017-05-25 MED ORDER — ATORVASTATIN CALCIUM 20 MG PO TABS
20.0000 mg | ORAL_TABLET | Freq: Every day | ORAL | Status: DC
  Administered 2017-05-25: 20 mg via ORAL
  Filled 2017-05-25: qty 1
  Filled 2017-05-25: qty 2

## 2017-05-25 MED ORDER — ACETAMINOPHEN 650 MG RE SUPP: 650.0000 mg | Freq: Four times a day (QID) | RECTAL | Status: DC | PRN

## 2017-05-25 MED ORDER — DEXTROSE 5 % IV SOLN
2.0000 g | Freq: Once | INTRAVENOUS | Status: AC
  Administered 2017-05-25: 2 g via INTRAVENOUS
  Filled 2017-05-25: qty 2

## 2017-05-25 MED ORDER — BUMETANIDE 2 MG PO TABS: 2.0000 mg | ORAL_TABLET | Freq: Two times a day (BID) | ORAL | Status: DC

## 2017-05-25 MED ORDER — INSULIN ASPART 100 UNIT/ML ~~LOC~~ SOLN
0.0000 [IU] | Freq: Three times a day (TID) | SUBCUTANEOUS | Status: DC
  Administered 2017-05-25: 3 [IU] via SUBCUTANEOUS
  Administered 2017-05-25 – 2017-05-26 (×2): 2 [IU] via SUBCUTANEOUS

## 2017-05-25 MED ORDER — POTASSIUM CHLORIDE CRYS ER 20 MEQ PO TBCR: 20.0000 meq | EXTENDED_RELEASE_TABLET | Freq: Every day | ORAL | Status: DC

## 2017-05-25 MED ORDER — TERAZOSIN HCL 2 MG PO CAPS
2.0000 mg | ORAL_CAPSULE | Freq: Every day | ORAL | Status: DC
  Administered 2017-05-25: 2 mg via ORAL
  Filled 2017-05-25: qty 1

## 2017-05-25 MED ORDER — PRO-STAT SUGAR FREE PO LIQD
30.0000 mL | Freq: Two times a day (BID) | ORAL | Status: DC
  Administered 2017-05-25 – 2017-05-26 (×3): 30 mL via ORAL
  Filled 2017-05-25 (×3): qty 30

## 2017-05-25 MED ORDER — ACETAMINOPHEN 325 MG PO TABS: 325.0000 mg | ORAL_TABLET | Freq: Four times a day (QID) | ORAL | Status: DC | PRN

## 2017-05-25 MED ORDER — LORATADINE 10 MG PO TABS
10.0000 mg | ORAL_TABLET | Freq: Every day | ORAL | Status: DC
  Administered 2017-05-25 – 2017-05-26 (×2): 10 mg via ORAL
  Filled 2017-05-25 (×2): qty 1

## 2017-05-25 MED ORDER — PANTOPRAZOLE SODIUM 40 MG PO TBEC
40.0000 mg | DELAYED_RELEASE_TABLET | Freq: Every day | ORAL | Status: DC
  Administered 2017-05-25 – 2017-05-26 (×2): 40 mg via ORAL
  Filled 2017-05-25 (×2): qty 1

## 2017-05-25 MED ORDER — DILTIAZEM HCL ER COATED BEADS 120 MG PO CP24
120.0000 mg | ORAL_CAPSULE | Freq: Every day | ORAL | Status: DC
  Administered 2017-05-25 – 2017-05-26 (×2): 120 mg via ORAL
  Filled 2017-05-25 (×2): qty 1

## 2017-05-25 NOTE — ED Notes (Signed)
Delay in obtaining EKG d/t malfunction of monitor in room that will not pick up V1. Will attempt portable EKG once back from Xray.

## 2017-05-25 NOTE — ED Notes (Addendum)
Pt given ice water per request. Pt calm and in NAD, states breathing improved.

## 2017-05-25 NOTE — ED Notes (Signed)
Patient transported to X-ray 

## 2017-05-25 NOTE — Care Management Note (Signed)
Case Management Note  Patient Details  Name: Gilbert Dominguez MRN: [id number] Date of Birth: 1945/11/07  Subjective/Objective:   Pt in with hypoxia and chronic respiratory failure. He is from home with his wife. Pt is active with [facility] for his home oxygen and supplies.                Action/Plan: Per notes pt "ran out of oxygen" yesterday. CM met with the patient and he states he was out at a car dealership and knew his portable tank was running low. He states he just didn't make it home in time. He states he has 20 E tanks and a concentrator through [employer] and a portable tank he bought through [employer]. He states the portable tank is the one that ran low. He feels it also doesn't give him enough oxygen when he uses it out of the house. Pt is going to call [employer] and see if the portable tank can be adjusted.  Pt currently has enough E tanks he can use for out of the house trips until the portable tank situation is worked out through [employer].  CM following for further d/c needs.   Expected Discharge Date:                  Expected Discharge Plan:  Home/Self Care  In-House Referral:     Discharge planning Services     Post Acute Care Choice:    Choice offered to:     DME Arranged:    DME Agency:     HH Arranged:    HH Agency:     Status of Service:  In process, will continue to follow  If discussed at Long Length of Stay Meetings, dates discussed:    Additional Comments:  [name], RN 05/25/2017, 10:26 AM

## 2017-05-25 NOTE — Care Management Obs Status (Signed)
[facility] NOTIFICATION   Patient Details  Name: Gibran Veselka MRN: [id number] Date of Birth: [birth year]   Medicare Observation Status Notification Given:  Yes    [name], RN 05/25/2017, 2:58 PM

## 2017-05-25 NOTE — Progress Notes (Signed)
Patient admitted after midnight, please see H&P.  Liver enzymes severely elevated.  Suspect due to hypoxemia-- will recheck in AM.  No RUQ pain, no Nausea, no vomiting.  Check viral hepatitis panel and d/c statin.  Gilbert Bear DO

## 2017-05-25 NOTE — H&P (Signed)
History and Physical    Gilbert Dominguez KWI:[id number] DOB: 08-13-1946 DOA: 05/24/2017  PCP: Leonard Downing, MD  Patient coming from: Home  I have personally briefly reviewed patient's old medical records in [place]  Chief Complaint: SOB  HPI: Gilbert Dominguez is a 71 y.o. male with medical history significant of pulmonary fibrosis, pulm HTN, RA.  Patient recently admitted last month, and discharged on Bumex for diuresis.  Had been doing well, until he ran out of his home O2 this evening.  He is on 4L O2 at baseline while at rest, and 8L with activity!  After running out of his home oxygen he became progressively SOB over the next 2-3 hours, became anxious and EMS was called.  He began to improve when placed on O2 by EMS.   ED Course: Initially has AG metabolic acidosis that appears to resolve spontaneously.  Lactate when finally measured is 2.9 (though I suspect it was probably higher on presentation).  WBC is 18k, similar to discharge and patient on chronic prednsione.  CXR is read as PNA so EDP started patient on HCAP coverage.  He is currently breathing at baseline, satting 98% on 4L.   Review of Systems: As per HPI otherwise 10 point review of systems negative.   Past Medical History:  Diagnosis Date  . CHF exacerbation ([place]) 02/24/2017  . Chronic respiratory failure ([place])   . Hypertension   . 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 Rheumatoid arthritis(714.0)     Past Surgical History:  Procedure Laterality Date  . ACNE CYST REMOVAL  1968  . VASECTOMY  1973     reports that he quit smoking about 21 years ago. His smoking use included Cigarettes. He has a 55.50 pack-year smoking history. He has never used smokeless tobacco. He reports that he drinks about 1.8 oz of alcohol per week . He reports that he does not use drugs.  Allergies  Allergen Reactions  . Lovastatin Rash    Family History  Problem Relation Age of Onset  . Heart disease Father     . Emphysema Father   . Liver cancer Mother      Prior to Admission medications   Medication Sig Start Date End Date Taking? Authorizing Provider  acetaminophen (TYLENOL) 325 MG tablet Take 325 mg by mouth every 6 (six) hours as needed for mild pain or headache.    Yes [provider]  Amino Acids-Protein Hydrolys (FEEDING SUPPLEMENT, PRO-STAT SUGAR FREE 64,) LIQD Take 30 mLs by mouth 2 (two) times daily. 05/06/17  Yes Johnson, Clanford L, MD  apixaban (ELIQUIS) 5 MG TABS tablet Take 1 tablet (5 mg total) by mouth 2 (two) times daily. 05/06/17  Yes Johnson, Clanford L, MD  atorvastatin (LIPITOR) 20 MG tablet Take 20 mg by mouth daily.   Yes [provider]  bumetanide (BUMEX) 2 MG tablet Take 1 tablet (2 mg total) by mouth 2 (two) times daily. 05/06/17  Yes Johnson, Clanford L, MD  Calcium Carbonate-Vit D-Min (CALCIUM 600+D PLUS MINERALS PO) Take 1 tablet by mouth 2 (two) times daily.   Yes [provider]  dextromethorphan-guaiFENesin (MUCINEX DM) 30-600 MG 12hr tablet Take 1 tablet by mouth 2 (two) times daily.    Yes [provider]  diltiazem (CARDIZEM CD) 120 MG 24 hr capsule Take 1 capsule (120 mg total) by mouth daily. 05/06/17  Yes Johnson, Clanford L, MD  famotidine (PEPCID) 20 MG tablet Take 1 tablet (20 mg total) by mouth at  bedtime. 01/27/17  Yes Tanda Rockers, MD  loratadine (CLARITIN) 10 MG tablet Take 10 mg by mouth daily.   Yes [provider]  metFORMIN (GLUCOPHAGE XR) 500 MG 24 hr tablet Take 2 tabs with breakfast and 2 tabs with supper 05/06/17  Yes Johnson, Clanford L, MD  OXYGEN Inhale 4-8 L into the lungs See admin instructions. 4 liters CONTINUOUSLY and to titrate up to 8 liters CONTINUOUSLY (with exertion) "to keep sats above 90" ([employer])   Yes [provider]  pantoprazole (PROTONIX) 40 MG tablet TAKE 1 TABLET BY MOUTH EVERY DAY *TAKE 30-60 MINUTES BEFORE FIRST MEAL OF THE DAY* 06/29/16  Yes Tanda Rockers, MD  potassium  chloride SA (K-DUR,KLOR-CON) 20 MEQ tablet Take 1 tablet (20 mEq total) by mouth daily. 12/12/16  Yes Bonnielee Haff, MD  predniSONE (DELTASONE) 10 MG tablet Take 2 tablets (20 mg total) by mouth daily with breakfast. Until FU with Pulmonary MD 03/04/17  Yes Domenic Polite, MD  terazosin (HYTRIN) 2 MG capsule Take 2 mg by mouth at bedtime.   Yes [provider]    Physical Exam: Vitals:   05/24/17 2315 05/24/17 2345 05/25/17 0000 05/25/17 0015  BP:  112/88 102/74 93/76  Pulse:  89 86 88  Resp:  (!) 31 (!) 25 (!) 21  SpO2:  93% 90% 98%  Weight: 55.8 kg (123 lb)     Height: _0  (1.6 m)       Constitutional: NAD, calm, comfortable Eyes: PERRL, lids and conjunctivae normal ENMT: Mucous membranes are moist. Posterior pharynx clear of any exudate or lesions.Normal dentition.  Neck: normal, supple, no masses, no thyromegaly Respiratory: clear to auscultation bilaterally, no wheezing, no crackles. Normal respiratory effort. No accessory muscle use.  Cardiovascular: Regular rate and rhythm, no murmurs / rubs / gallops. No extremity edema. 2+ pedal pulses. No carotid bruits.  Abdomen: no tenderness, no masses palpated. No hepatosplenomegaly. Bowel sounds positive.  Musculoskeletal: no clubbing / cyanosis. No joint deformity upper and lower extremities. Good ROM, no contractures. Normal muscle tone.  Skin: no rashes, lesions, ulcers. No induration Neurologic: CN 2-12 grossly intact. Sensation intact, DTR normal. Strength 5/5 in all 4.  Psychiatric: Normal judgment and insight. Alert and oriented x 3. Normal mood.    Labs on Admission: I have personally reviewed following labs and imaging studies  CBC:  Recent Labs Lab 05/24/17 2334  WBC 18.5*  NEUTROABS 17.6*  HGB 14.1  HCT 44.1  MCV 91.5  PLT 720   Basic Metabolic Panel:  Recent Labs Lab 05/24/17 2334 05/25/17 0227  NA 133* 131*  K 5.0 3.9  CL 94* 95*  CO2 16* 23  GLUCOSE 93 130*  BUN 41* 42*  CREATININE 1.68*  1.46*  CALCIUM 9.4 8.7*   GFR: Estimated Creatinine Clearance: 36.1 mL/min (A) (by C-G formula based on SCr of 1.46 mg/dL (H)). Liver Function Tests:  Recent Labs Lab 05/24/17 2334  AST 335*  ALT 253*  ALKPHOS 136*  BILITOT 3.2*  PROT 7.4  ALBUMIN 3.5   No results for input(s): LIPASE, AMYLASE in the last 168 hours. No results for input(s): AMMONIA in the last 168 hours. Coagulation Profile: No results for input(s): INR, PROTIME in the last 168 hours. Cardiac Enzymes: No results for input(s): CKTOTAL, CKMB, CKMBINDEX, TROPONINI in the last 168 hours. BNP (last 3 results)  Recent Labs  01/20/17 1642  PROBNP 1,867.0*   HbA1C: No results for input(s): HGBA1C in the last 72 hours. CBG: No  results for input(s): GLUCAP in the last 168 hours. Lipid Profile: No results for input(s): CHOL, HDL, LDLCALC, TRIG, CHOLHDL, LDLDIRECT in the last 72 hours. Thyroid Function Tests: No results for input(s): TSH, T4TOTAL, FREET4, T3FREE, THYROIDAB in the last 72 hours. Anemia Panel: No results for input(s): VITAMINB12, FOLATE, FERRITIN, TIBC, IRON, RETICCTPCT in the last 72 hours. Urine analysis:    Component Value Date/Time   COLORURINE YELLOW 05/25/2017 0157   APPEARANCEUR HAZY (A) 05/25/2017 0157   LABSPEC 1.011 05/25/2017 0157   PHURINE 5.0 05/25/2017 0157   GLUCOSEU NEGATIVE 05/25/2017 0157   HGBUR MODERATE (A) 05/25/2017 0157   BILIRUBINUR NEGATIVE 05/25/2017 0157   KETONESUR NEGATIVE 05/25/2017 0157   PROTEINUR 100 (A) 05/25/2017 0157   NITRITE NEGATIVE 05/25/2017 0157   LEUKOCYTESUR NEGATIVE 05/25/2017 0157    Radiological Exams on Admission: Dg Chest 2 View  Result Date: 05/25/2017 CLINICAL DATA:  Acute onset of shortness of breath. Initial encounter. EXAM: CHEST  2 VIEW COMPARISON:  Chest radiograph performed 05/09/2017 FINDINGS: Right midlung and left basilar airspace opacities raise concern for multifocal pneumonia. No definite pleural effusion or pneumothorax is  seen. The cardiomediastinal silhouette is mildly enlarged. No acute osseous abnormalities are identified. IMPRESSION: 1. Right midlung and left basilar airspace opacities raise concern for multifocal pneumonia. 2. Mild cardiomegaly. Electronically Signed   By: Garald Balding M.D.   On: 05/25/2017 00:06    EKG: Independently reviewed.  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 Chronic resp failure with hypoxia - 1. Not convinced patient has PNA 2. Will hold off on further ABx for now and observe overnight 3. Continue home O2, patient states breathing is currently baseline 4. Again suspect strongly given the HPI that patient was profoundly hypoxic due to simply running out of his home oxygen. 2. CKD - creat bumped from discharge on 22nd 1. Suspect this due to aggressive diuresis with bumex 2. Will hold bumex for tomorrow AM 3. May wish to resume at lower dose than 51m BID when this is resumed on discharge 3. AG metabolic acidosis - Resolved 1. Suspect he probably had lactic acidosis due to hypoxia 4. DM2 - 1. Sensitive scale SSI AC 5. Transaminitis - 1. Has had at various times in past 2. Repeat CMP in AM  DVT prophylaxis: Continue Eliquis Code Status: Full Family Communication: Wife at bedside Disposition Plan: Home after admit Consults called: None [place] [facility] Pager [phone] If 7AM-7PM, please contact day team taking care of patient [email or web address] Password [facility] 05/25/2017, 3:39 AM

## 2017-05-25 NOTE — Progress Notes (Signed)
Pt admitted from ED with the c/o of SOB, alert and oriented, denies any pain, pt settled in bed with call light and family at bedside, was however reassured and will continue to monitor. Obasogie-Asidi, Cuong Moorman Efe

## 2017-05-26 DIAGNOSIS — N183 Chronic kidney disease, stage 3 (moderate): Secondary | ICD-10-CM

## 2017-05-26 DIAGNOSIS — J9611 Chronic respiratory failure with hypoxia: Secondary | ICD-10-CM | POA: Diagnosis not present

## 2017-05-26 DIAGNOSIS — R740 Nonspecific elevation of levels of transaminase and lactic acid dehydrogenase [LDH]: Secondary | ICD-10-CM

## 2017-05-26 LAB — CBC
HEMATOCRIT: 33 % — AB (ref 39.0–52.0)
HEMOGLOBIN: 11.3 g/dL — AB (ref 13.0–17.0)
MCH: 30.5 pg (ref 26.0–34.0)
MCHC: 34.2 g/dL (ref 30.0–36.0)
MCV: 88.9 fL (ref 78.0–100.0)
Platelets: 172 10*3/uL (ref 150–400)
RBC: 3.71 MIL/uL — AB (ref 4.22–5.81)
RDW: 17.6 % — ABNORMAL HIGH (ref 11.5–15.5)
WBC: 13.5 10*3/uL — AB (ref 4.0–10.5)

## 2017-05-26 LAB — COMPREHENSIVE METABOLIC PANEL
ALK PHOS: 158 U/L — AB (ref 38–126)
ALT: 1364 U/L — ABNORMAL HIGH (ref 17–63)
ANION GAP: 8 (ref 5–15)
AST: 1746 U/L — ABNORMAL HIGH (ref 15–41)
Albumin: 2.5 g/dL — ABNORMAL LOW (ref 3.5–5.0)
BILIRUBIN TOTAL: 1.8 mg/dL — AB (ref 0.3–1.2)
BUN: 37 mg/dL — ABNORMAL HIGH (ref 6–20)
CHLORIDE: 98 mmol/L — AB (ref 101–111)
CO2: 26 mmol/L (ref 22–32)
Calcium: 8.3 mg/dL — ABNORMAL LOW (ref 8.9–10.3)
Creatinine, Ser: 0.97 mg/dL (ref 0.61–1.24)
Glucose, Bld: 177 mg/dL — ABNORMAL HIGH (ref 65–99)
POTASSIUM: 3.5 mmol/L (ref 3.5–5.1)
Sodium: 132 mmol/L — ABNORMAL LOW (ref 135–145)
TOTAL PROTEIN: 5.4 g/dL — AB (ref 6.5–8.1)

## 2017-05-26 LAB — GLUCOSE, CAPILLARY: Glucose-Capillary: 167 mg/dL — ABNORMAL HIGH (ref 65–99)

## 2017-05-26 LAB — HEPATITIS PANEL, ACUTE
HEP A IGM: NEGATIVE
HEP B S AG: NEGATIVE
Hep B C IgM: NEGATIVE

## 2017-05-26 NOTE — Care Management Note (Signed)
Case Management Note  Patient Details  Name: Gilbert Dominguez MRN: [id number] Date of Birth: [birth year]  Subjective/Objective:                    Action/Plan: Pt discharging home with self care. Pts wife brought oxygen tank for transportation home. Pt has PCP, insurance and transportation home. No further needs per CM.   Expected Discharge Date:  05/26/17               Expected Discharge Plan:  Home/Self Care  In-House Referral:     Discharge planning Services     Post Acute Care Choice:    Choice offered to:     DME Arranged:    DME Agency:     HH Arranged:    HH Agency:     Status of Service:  Completed, signed off  If discussed at [employer] of Stay Meetings, dates discussed:    Additional Comments:  [name], RN 05/26/2017, 12:06 PM

## 2017-05-26 NOTE — Progress Notes (Signed)
D/c instructions reviewed with patient and sister.

## 2017-05-26 NOTE — Discharge Summary (Signed)
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  Diet recommendation: Low sodium, heart healthy  Wound care: None.   History of Present Illness:   Gilbert Dominguez is a 71 y.o. male with medical history significant of pulmonary fibrosis, pulm HTN, RA.  Patient recently admitted last month, and discharged on Bumex for diuresis.  Had been doing well, until he ran out of his home O2 this evening.  He is on 4L O2 at baseline while at rest, and 8L with activity!  After running out of his home oxygen he became progressively SOB over the next 2-3 hours, became anxious and EMS was called.  He began to improve when placed on O2 by EMS.   ED Course: Initially has AG metabolic acidosis that appears to resolve spontaneously.  Lactate when finally measured is 2.9 (though I suspect it was probably higher on presentation).  WBC is 18k, similar to discharge and patient on chronic prednsione.  CXR is read as PNA so EDP started patient on HCAP coverage.  He is currently breathing at baseline, satting 98% on 4L.   Hospital Course by Problem:   Elevated liver enzymes -review shows they have elevated in past -recheck as out-patient--- if still elevated, may need CT scan/US -avoid tylenol/alcohol -no RUQ pain -?from  hypoxia/hypotension -viral hepatitis panel negative -hold statin for now  CKD- stage 3 -outpatient follow up   Acute on chronic respiratory failure -ran out of O2 at home -SOB and hypoxia resolved with O2 at hospital     Medical Consultants:    None.   Discharge Exam:   Vitals:   05/26/17 0055 05/26/17 0540  BP: 104/72 99/69  Pulse: 69 61  Resp: 20 20  Temp: 98.5 F (36.9 C) 98.2 F (36.8 C)   Vitals:   05/25/17 1738 05/25/17 2048 05/26/17 0055 05/26/17 0540  BP: 101/73 96/71 104/72 99/69  Pulse: 73 67 69 61  Resp: _0 Temp: 98.1 F (36.7 C) 98 F (36.7 C) 98.5 F (36.9 C) 98.2 F (36.8 C)  TempSrc: Oral Oral Oral Oral  SpO2: 92% 100% 100% 100%  Weight:      Height:        Gen:  NAD   The results of significant diagnostics from this hospitalization (including imaging, microbiology, ancillary and laboratory) are listed below for reference.     Procedures and Diagnostic Studies:   Dg Chest 2 View  Result Date: 05/25/2017 CLINICAL DATA:  Acute onset of shortness of breath. Initial encounter. EXAM: CHEST  2 VIEW COMPARISON:  Chest radiograph performed 05/09/2017 FINDINGS: Right midlung and left basilar airspace opacities raise concern for multifocal pneumonia. No definite pleural effusion or pneumothorax is seen. The cardiomediastinal silhouette is mildly enlarged. No acute osseous abnormalities are identified. IMPRESSION: 1. Right midlung and left basilar airspace opacities raise concern for multifocal pneumonia. 2.  Mild cardiomegaly. Electronically Signed   By: Garald Balding M.D.   On: 05/25/2017 00:06     Labs:   Basic Metabolic Panel:  Recent Labs Lab 05/24/17 2334 05/25/17 0227 05/25/17 0421 05/26/17 0438  NA 133* 131* 131* 132*  K 5.0 3.9 3.9 3.5  CL 94* 95* 95* 98*  CO2 16* _0 GLUCOSE 93 130* 151* 177*  BUN 41* 42* 40* 37*  CREATININE 1.68* 1.46* 1.37* 0.97  CALCIUM 9.4 8.7* 8.4* 8.3*   GFR Estimated Creatinine  Clearance: 53.2 mL/min (by C-G formula based on SCr of 0.97 mg/dL). Liver Function Tests:  Recent Labs Lab 05/24/17 2334 05/25/17 0421 05/26/17 0438  AST 335* 2,743* 1,746*  ALT 253* 1,544* 1,364*  ALKPHOS 136* 123 158*  BILITOT 3.2* 3.1* 1.8*  PROT 7.4 5.3* 5.4*  ALBUMIN 3.5 2.7* 2.5*   No results for input(s): LIPASE, AMYLASE in the last 168 hours. No results for input(s): AMMONIA in the last 168 hours. Coagulation profile No results for input(s): INR, PROTIME in the last 168 hours.  CBC:  Recent Labs Lab 05/24/17 2334 05/26/17 0438  WBC 18.5* 13.5*  NEUTROABS 17.6*  --   HGB 14.1 11.3*  HCT 44.1 33.0*  MCV 91.5 88.9  PLT 337 172   Cardiac Enzymes: No results for input(s): CKTOTAL, CKMB, CKMBINDEX, TROPONINI in the last 168 hours. BNP: Invalid input(s): POCBNP CBG:  Recent Labs Lab 05/25/17 0826 05/25/17 1217 05/25/17 1634 05/25/17 2103 05/26/17 0602  GLUCAP 90 166* 237* 133* 167*   D-Dimer No results for input(s): DDIMER in the last 72 hours. Hgb A1c No results for input(s): HGBA1C in the last 72 hours. Lipid Profile No results for input(s): CHOL, HDL, LDLCALC, TRIG, CHOLHDL, LDLDIRECT in the last 72 hours. Thyroid function studies No results for input(s): TSH, T4TOTAL, T3FREE, THYROIDAB in the last 72 hours.  Invalid input(s): FREET3 Anemia work up No results for input(s): VITAMINB12, FOLATE, FERRITIN, TIBC, IRON, RETICCTPCT in the last 72 hours. Microbiology No results found for this or any previous visit (from the past 240 hour(s)).   Discharge Instructions:   Discharge Instructions    Diet - low sodium heart healthy    Complete by:  As directed    Discharge instructions    Complete by:  As directed    Avoid liver toxic meds/beverages: tylenol/alcohol CMP with PCP in 1 week-- if liver enzymes still elevated, will need further imaging- either U/S or preferably CT Scan   Increase activity slowly    Complete by:  As directed       Allergies as of 05/26/2017      Reactions   Lovastatin Rash      Medication List    STOP taking these medications   acetaminophen 325 MG tablet Commonly known as:  TYLENOL   atorvastatin 20 MG tablet Commonly known as:  LIPITOR     TAKE these medications   apixaban 5 MG Tabs tablet Commonly known as:  ELIQUIS Take 1 tablet (5 mg total) by mouth 2 (two) times daily.   bumetanide 2 MG tablet Commonly known as:  BUMEX Take 1 tablet (2 mg total) by mouth 2 (two) times daily.   CALCIUM 600+D PLUS MINERALS PO Take 1 tablet by mouth 2 (two) times daily.   dextromethorphan-guaiFENesin 30-600 MG 12hr tablet Commonly known as:  MUCINEX DM Take 1 tablet by mouth 2 (two) times daily.   diltiazem 120 MG 24 hr capsule Commonly known as:  CARDIZEM CD Take  1 capsule (120 mg total) by mouth daily.   famotidine 20 MG tablet Commonly known as:  PEPCID Take 1 tablet (20 mg total) by mouth at bedtime.   feeding supplement (PRO-STAT SUGAR FREE 64) Liqd Take 30 mLs by mouth 2 (two) times daily.   loratadine 10 MG tablet Commonly known as:  CLARITIN Take 10 mg by mouth daily.   metFORMIN 500 MG 24 hr tablet Commonly known as:  GLUCOPHAGE XR Take 2 tabs with breakfast and 2 tabs with supper   OXYGEN Inhale 4-8 L into the lungs See admin instructions. 4 liters CONTINUOUSLY and to titrate up to 8 liters CONTINUOUSLY (with exertion) "to keep sats above 90" ([employer])   pantoprazole 40 MG tablet Commonly known as:  PROTONIX TAKE 1 TABLET BY MOUTH EVERY DAY *TAKE 30-60 MINUTES BEFORE FIRST MEAL OF THE DAY*   potassium chloride SA 20 MEQ tablet Commonly known as:  K-DUR,KLOR-CON Take 1 tablet (20 mEq total) by mouth daily.   predniSONE 10 MG tablet Commonly known as:  DELTASONE Take 2 tablets (20 mg total) by mouth daily with breakfast. Until FU with Pulmonary MD   terazosin 2 MG capsule Commonly known as:  HYTRIN Take 2 mg by mouth at bedtime.      Follow-up Information     Leonard Downing, MD Follow up in 1 week(s).   Specialty:  Family Medicine Why:  CMP Contact information: [place] [address] [phone]            Time coordinating discharge: 35 min  Signed:  JESSICA Alison Stalling   [facility] 05/26/2017, 8:49 AM

## 2017-05-27 ENCOUNTER — Encounter: Payer: Self-pay | Admitting: Internal Medicine

## 2017-05-27 ENCOUNTER — Ambulatory Visit: Payer: Medicare Other | Admitting: Internal Medicine

## 2017-05-27 ENCOUNTER — Ambulatory Visit (INDEPENDENT_AMBULATORY_CARE_PROVIDER_SITE_OTHER): Payer: Medicare Other | Admitting: Internal Medicine

## 2017-05-27 VITALS — BP 92/60 | HR 91 | Ht 63.0 in | Wt 124.0 lb

## 2017-05-27 DIAGNOSIS — J8410 Pulmonary fibrosis, unspecified: Secondary | ICD-10-CM

## 2017-05-27 DIAGNOSIS — I2729 Other secondary pulmonary hypertension: Secondary | ICD-10-CM | POA: Diagnosis not present

## 2017-05-27 DIAGNOSIS — I50810 Right heart failure, unspecified: Secondary | ICD-10-CM

## 2017-05-27 DIAGNOSIS — J9611 Chronic respiratory failure with hypoxia: Secondary | ICD-10-CM | POA: Diagnosis not present

## 2017-05-27 NOTE — Progress Notes (Signed)
Subjective:   Patient ID: Gilbert Dominguez, male    DOB: 1946/01/07   MRN: [id number]   Brief patient profile:  72  yowm quit smoking 1996 with dx of RA around 2010 followed by [facility] referred 11/23/2013 to pulmonary clinic for ? ILD with pfts c/w mod restriction  09/04/2014 unchanged from 09/2013     History of Present Illness  11/23/2013 1st [facility] office visit/ [name] cc new doe x Jan 2014 and stopped all RA meds(mtx) in august of 2014 - onset was insidious and gradual to point where walk 50 ft esp last month assoc with dry cough but ok  control of arthritis ( vs historical control) and mild chronic nasal congestion s purulent secretions or sinus pain or epistaxis  rec Please see patient coordinator before you leave today  to schedule 02 24/7  2lpm at rest and 4lpm with activity  Prednisone 20 mg twice daily with meals  Pantoprazole (protonix) 40 mg   Take 30-60 min before first meal of the day and Pepcid 20 mg one bedtime until return to office - this is the best way to tell whether stomach acid is contributing to your problem.   GERD diet reviewed   03/08/2014 f/u ov/[name] re: ILD / 02 dep at hs and ex not using 02 as rec  Chief Complaint  Patient presents with  . Follow-up    c/o nonprod cough after cold food/drinks.  No other complaints at this time.   can walk medium pace flat s 02 for 15-20 min and sats are in mid 80s  arthritis and breathing much better since rx with prednisone being tapered down by Dr Charlestine Night to 20 mg per day  rec No need for 02 at rest or room to room walking Please use 2lpm at hs and   2lpm with walking outside more than 200 ft  Please see patient coordinator before you leave today  to schedule a portable system for your daily walk   09/04/2014 f/u ov/[name] re: ILD/ new cough  Chief Complaint  Patient presents with  . Follow-up    PFT done today. Breathing is unchanged. Pt c/o cough for the past 6 wks- "makes me have to clear throat".   just  using 02 at hs / Not limited by breathing from desired activities   Cough is new problem x 6 weeks day > noct min white mucus  On fosfamax  Weaned off pred effective early August  Started acutely with ? [name] feels like persistent pnds  rec I recommend against taking fosfamax as long as you are coughing as it may contribute to the cough  For drainage >>take chlortrimeton (chlorpheniramine) 4 mg every 4 hours available over the counter (may cause drowsiness, available over the counter) For cough>> Take delsym two tsp every 12 hours   to suppress the urge to cough. Swallowing water or using ice chips/non mint and non menthol containing candies (such as lifesavers or sugarless jolly ranchers) are also effective.         11/03/2016  f/u ov/[name] re: PF / pred 10 mg x 3 weeks - has started rehab and req as much as 8lpm with exertion  Chief Complaint  Patient presents with  . Follow-up    Pt. states his breathing has remained unchanged, Still coughing, not producing much, still sob with exertion Denies chest pain or tightness  No change doe = MMRC2  rec Adjust your 02 to maintain your sats in upper 80 or  lower 90s     Admit date: 12/07/2016 Discharge date: 12/12/2016  DISCHARGE DIAGNOSES:  Principal Problem:   Acute on chronic respiratory failure [facility]) Active Problems:   Postinflammatory pulmonary fibrosis (HCC)   Cough   Essential hypertension   Fluid overload   Elevated troponin   Hyperlipidemia   Anemia  01/01/2017  f/u ov/[name] re: post hosp/ transition of care  PF on 02  Chief Complaint  Patient presents with  . Hospitalization Follow-up    Breathing has improved since hospital d/c.     abruptly ill Feb 14th 2018  took a deep breath and panicked when couldn't breath or speak while back on fosfamax - note hx at the time was progressive sob x one week and was not admitted until 5 days p his "spell" with evidence of PH noted on admit but neg CTa for PE  rec Stop fosfamax and  discuss alternatives with Dr Charlestine Night  (IV yearly Reclast)  Continue Pantoprazole (protonix) 40 mg   Take  30-60 min before first meal of the day and Pepcid (famotidine)  20 mg one @  bedtime until return to office - this is the best way to tell whether stomach acid is contributing to your problem.         01/19/17 [facility] eval for leg swelling rec We will check basic metabolic panel, Magnesium, phosphorus, BNP, d-dimer today Increase Lasix to 2 tabs (34m each) twice daily Increase potassium to 1 tablet twice daily      02/02/2017  f/u ov/[name] re:  PF/ chronic resp failure/ PH  On Protonix 40 mg bid / predisone still 20 mg daily  Chief Complaint  Patient presents with  . Follow-up    PFT done today   swelling is better, breathing about the same since diuretics doubled, some cramping occasionally but tolerating well  rec Please schedule a follow up visit in 3 months but call sooner if needed  Late add:  D/c Kdur, return for med calendar and recheck [facility]    Admit date: 05/24/2017 Discharge date: 05/26/2017  Recommendations for Outpatient Follow-Up:   Avoid liver toxic meds/beverages: tylenol/alcohol CMP with PCP in 1 week-- if liver enzymes still elevated, will need further imaging- either U/S or preferably CT Scan  Discharge Diagnosis:   Principal Problem:   Chronic respiratory failure with hypoxia ([place] Active Problems:   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 Follow-up    Pt states that he wsa admitted to hospital after running out of o2 at home- 05/24/17- 05/26/17. Breathing has improved some, but back not to baseline. His appetite has been poor.    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 What is your action plan if you swell   A: I don't know   Very confused with details of care, no longer has med calendar with written action plan    No obvious day to day or daytime variability or assoc excess/ purulent sputum or mucus plugs or hemoptysis or cp or chest  tightness, subjective wheeze or overt sinus or hb symptoms. No unusual exp hx or h/o childhood pna/ asthma or knowledge of premature birth.  Sleeping ok without nocturnal  or early am exacerbation  of respiratory  c/o's or need for noct saba. Also denies any obvious fluctuation of symptoms with weather or environmental changes or other aggravating or alleviating factors except as outlined above   Current Medications, Allergies, Complete Past Medical History, Past Surgical History, Family History, and Social History were reviewed in [employer] record.  ROS  The following are not active complaints unless bolded sore throat, dysphagia, dental problems, itching, sneezing,  nasal congestion or excess/ purulent secretions, ear ache,   fever, chills, sweats, unintended wt loss, classically pleuritic or exertional cp,  orthopnea pnd or leg swelling, presyncope, palpitations, abdominal pain, anorexia, nausea, vomiting, diarrhea  or change in bowel or bladder habits, change in stools or urine, dysuria,hematuria,  rash, arthralgias, visual complaints, headache, numbness, weakness or ataxia or problems with walking or coordination,  change in mood/affect or memory.                       Objective:   Physical Exam  amb wm nad / vital signs reviewed -  - Note on arrival 02 sats  88% on 5lpm   pulsed  09/04/2014      160 > 12/12/2014   162 > 04/11/2015   158  > 11/19/2015   142  > 12/31/2015 140 > 02/11/2016  137 > 04/20/2016  134 > 07/21/2016 134 >  11/03/2016   136  > 01/01/2017    135 >  02/02/2017  133 > 05/27/2017   124 (target 117)     03/08/14 159 lb (72.122 kg)  12/08/13 154 lb (69.854 kg)  11/23/13 158 lb 3.2 oz (71.759 kg)           HEENT: nl dentition, turbinates, and oropharynx. Nl external ear canals without cough reflex   NECK :  without JVD/Nodes/TM/ nl carotid upstrokes bilaterally   LUNGS:   Bronchovesicular bs with coarse crackles bilaterally R > L   With  cough on deep insp    CV:  RRR  no s3 or murmur or increase in P2,   1+   bilaterally sym ankle edema  - pos Raynaud's changes both hands   ABD:  soft and nontender with nl excursion in the supine position. No bruits or organomegaly, bowel sounds nl  MS:  warm without deformities, calf tenderness, cyanosis- mild clubbng   SKIN: warm and dry without lesions    NEURO:  alert, approp, no deficits         I personally reviewed images and agree with radiology impression as follows:  CXR:   05/25/17 1. Right midlung and left basilar airspace opacities raise concern for multifocal pneumonia. 2. Mild cardiomegaly. My impression:  More c/w chf than pna                   Assessment & Plan:

## 2017-05-27 NOTE — Patient Instructions (Addendum)
Take extra fluid pill daily until wt back down to 117   See Tammy NP in 4  weeks with all your medications, even over the counter meds, separated in two separate bags, the ones you take no matter what vs the ones you stop once you feel better and take only as needed when you feel you need them.   Tammy  will generate for you a new user friendly medication calendar that will put [place] all on the same page re: your medication use.     Without this process, it simply isn't possible to assure that we are providing  your outpatient care  with  the attention to detail we feel you deserve.   If we cannot assure that you're getting that kind of care,  then we cannot manage your problem effectively from this clinic.  Once you have seen Tammy and we are sure that we're all on the same page with your medication use she will arrange follow up with me.

## 2017-05-30 NOTE — Assessment & Plan Note (Signed)
-  pfts 09/27/14        VC 2.2(59%) no obst with dlco 38 corrects to 63 - PFTs 09/04/2014 VC 2.3 (62%) no obst with dlco 38 corrects to 83%  - RA sats 62% 11/23/2013 at rest > started 24h 02 (see chronic respiratory failure)  - 12/12/2014  Walked RA  2 laps @ 185 ft each stopped due to  desat to 84% nl pace  - 11/23/2013 ESR 95 rx pred 20 mg bid  >  12/08/2013 = 14 > rx per [facility]  - PFTs 04/11/2015    VC 2.1 s obst and dlco 41 corrects to 75% - ESR 11/19/2015 = 14  - ESR 12/31/2015 =  48 and worse sob/cough > try prednisone 20 mg daily until better and then 10 mg maintenance until follow-up office visit. - PFTs  04/20/2016    VC 2.15 (60%) s obst and dlco 23/22 and corrects to 47%  - 07/21/2016 Referred to rehab > started early Nov 2017 - PFTs 02/02/2017   VC 2.12 (64%) and dlco  35/34 and dlco 46%   The goal with a chronic steroid dependent illness is always arriving at the lowest effective dose that controls the disease/symptoms and not accepting a set "formula" which is based on statistics or guidelines that don't always take into account patient  variability or the natural hx of the dz in every individual patient, which may well vary over time.  For now therefore I recommend the patient maintain  20 mg daily until seen by rheum as planned

## 2017-05-30 NOTE — Assessment & Plan Note (Signed)
Echo 05/01/17  Left ventricle: The cavity size was mildly reduced. Systolic   function was normal. The estimated ejection fraction was in the   range of 55% to 60%. Wall motion was normal; there were no   regional wall motion abnormalities. Doppler parameters are   consistent with abnormal left ventricular relaxation (grade 1   diastolic dysfunction). - Ventricular septum: The contour showed diastolic flattening and   systolic flattening. These changes are consistent with RV volume   and pressure overload. - Aortic valve: There was very mild stenosis. Valve area (VTI):   1.85 cm^2. Valve area (Vmax): 1.61 cm^2. Valve area (Vmean): 1.5   cm^2. - Right ventricle: The cavity size was severely dilated. Systolic   function was moderately to severely reduced. - Right atrium: The atrium was severely dilated. - Tricuspid valve: There was moderate-severe regurgitation directed   eccentrically. - Pulmonary arteries: Systolic pressure was severely increased. PA   peak pressure: 87 mm Hg (S).  rx at this point is 02 and adjustments of bumex to maintain goal wt of 117/ reviewed

## 2017-05-30 NOTE — Assessment & Plan Note (Signed)
-  RA sats 62% 11/23/2013  - 11/23/2013   Walked 4lpm  x one lap @ 185 stopped due to desat to 73%  - 12/08/2013  Rest 94% RA,  Walked 4lpm x 3 laps @ 185 ft each stopped due to  End of study, sats still 94%  - 03/08/2014  Walked RA  2 laps @ 185 ft each stopped due to sats 86 corrected on 2lpm   - 12/12/2014  Walked RA x 2 laps @ 185 ft each stopped due to  sats 84 nl pace  - 04/11/2015  Walked RA x 2 laps @ 185 ft each stopped due to sats 83%nl pace  - 11/19/2015   Walked RA x one lap @ 185 stopped due to  83% nl pace/ resolved on 2lpm     rec as of  05/30/2017 >>>  Adjust for sats > 90% at all times   He is clearly chronically severely 02 dep with components of ILD from RA and chf and needs to pay more attention to adjusting 02 as needed to keeps sats > 90% and not running out of 02 at any point or can anticipate rapid decline and back to ER   I had an extended discussion with the patient reviewing all relevant studies completed to date and  lasting 15 to 20 minutes of a 25 minute post hosp f/u office  visit    Each maintenance medication was reviewed in detail including most importantly the difference between maintenance and prns and under what circumstances the prns are to be triggered using an action plan format that is not reflected in the computer generated alphabetically organized AVS.    Please see AVS for specific instructions unique to this visit that I personally wrote and verbalized to the the pt in detail and then reviewed with pt  by my nurse highlighting any  changes in therapy recommended at today's visit to their plan of care.    Will need to return next for med reconciliation:   To keep things simple, I have asked the patient to first separate medicines that are perceived as maintenance, that is to be taken daily "no matter what", from those medicines that are taken on only on an as-needed basis and I have given the patient examples of both, and then return to see our NP to generate a   detailed  medication calendar which should be followed until the next physician sees the patient and updates it.

## 2017-05-31 ENCOUNTER — Emergency Department (HOSPITAL_COMMUNITY): Payer: Medicare Other

## 2017-05-31 ENCOUNTER — Inpatient Hospital Stay (HOSPITAL_COMMUNITY)
Admission: EM | Admit: 2017-05-31 | Discharge: 2017-06-03 | DRG: 308 | Disposition: A | Discharge status: Home / Self Care | Payer: Medicare Other | Attending: Nephrology | Admitting: Nephrology

## 2017-05-31 ENCOUNTER — Encounter (HOSPITAL_COMMUNITY): Payer: Self-pay

## 2017-05-31 DIAGNOSIS — Z8249 Family history of ischemic heart disease and other diseases of the circulatory system: Secondary | ICD-10-CM | POA: Diagnosis not present

## 2017-05-31 DIAGNOSIS — Z9981 Dependence on supplemental oxygen: Secondary | ICD-10-CM

## 2017-05-31 DIAGNOSIS — E1122 Type 2 diabetes mellitus with diabetic chronic kidney disease: Secondary | ICD-10-CM | POA: Diagnosis present

## 2017-05-31 DIAGNOSIS — M069 Rheumatoid arthritis, unspecified: Secondary | ICD-10-CM | POA: Diagnosis present

## 2017-05-31 DIAGNOSIS — Z7952 Long term (current) use of systemic steroids: Secondary | ICD-10-CM

## 2017-05-31 DIAGNOSIS — E785 Hyperlipidemia, unspecified: Secondary | ICD-10-CM | POA: Diagnosis present

## 2017-05-31 DIAGNOSIS — I4892 Unspecified atrial flutter: Secondary | ICD-10-CM | POA: Diagnosis not present

## 2017-05-31 DIAGNOSIS — I2729 Other secondary pulmonary hypertension: Secondary | ICD-10-CM | POA: Diagnosis present

## 2017-05-31 DIAGNOSIS — Z79899 Other long term (current) drug therapy: Secondary | ICD-10-CM

## 2017-05-31 DIAGNOSIS — I4891 Unspecified atrial fibrillation: Secondary | ICD-10-CM | POA: Diagnosis present

## 2017-05-31 DIAGNOSIS — I130 Hypertensive heart and chronic kidney disease with heart failure and stage 1 through stage 4 chronic kidney disease, or unspecified chronic kidney disease: Secondary | ICD-10-CM | POA: Diagnosis present

## 2017-05-31 DIAGNOSIS — D649 Anemia, unspecified: Secondary | ICD-10-CM | POA: Diagnosis present

## 2017-05-31 DIAGNOSIS — I350 Nonrheumatic aortic (valve) stenosis: Secondary | ICD-10-CM | POA: Diagnosis present

## 2017-05-31 DIAGNOSIS — Z800 Family history of malignant neoplasm of digestive organs: Secondary | ICD-10-CM | POA: Diagnosis not present

## 2017-05-31 DIAGNOSIS — J9611 Chronic respiratory failure with hypoxia: Secondary | ICD-10-CM | POA: Diagnosis present

## 2017-05-31 DIAGNOSIS — I10 Essential (primary) hypertension: Secondary | ICD-10-CM | POA: Diagnosis present

## 2017-05-31 DIAGNOSIS — N183 Chronic kidney disease, stage 3 unspecified: Secondary | ICD-10-CM | POA: Diagnosis present

## 2017-05-31 DIAGNOSIS — N179 Acute kidney failure, unspecified: Secondary | ICD-10-CM | POA: Diagnosis present

## 2017-05-31 DIAGNOSIS — Z9852 Vasectomy status: Secondary | ICD-10-CM

## 2017-05-31 DIAGNOSIS — Z7901 Long term (current) use of anticoagulants: Secondary | ICD-10-CM

## 2017-05-31 DIAGNOSIS — I5033 Acute on chronic diastolic (congestive) heart failure: Secondary | ICD-10-CM | POA: Diagnosis present

## 2017-05-31 DIAGNOSIS — I5032 Chronic diastolic (congestive) heart failure: Secondary | ICD-10-CM | POA: Diagnosis present

## 2017-05-31 DIAGNOSIS — Z9111 Patient's noncompliance with dietary regimen: Secondary | ICD-10-CM | POA: Diagnosis not present

## 2017-05-31 DIAGNOSIS — Z7984 Long term (current) use of oral hypoglycemic drugs: Secondary | ICD-10-CM

## 2017-05-31 DIAGNOSIS — E876 Hypokalemia: Secondary | ICD-10-CM | POA: Diagnosis present

## 2017-05-31 DIAGNOSIS — F17210 Nicotine dependence, cigarettes, uncomplicated: Secondary | ICD-10-CM | POA: Diagnosis present

## 2017-05-31 DIAGNOSIS — J8410 Pulmonary fibrosis, unspecified: Secondary | ICD-10-CM | POA: Diagnosis present

## 2017-05-31 DIAGNOSIS — Z825 Family history of asthma and other chronic lower respiratory diseases: Secondary | ICD-10-CM | POA: Diagnosis not present

## 2017-05-31 DIAGNOSIS — Z888 Allergy status to other drugs, medicaments and biological substances status: Secondary | ICD-10-CM

## 2017-05-31 DIAGNOSIS — R000 Tachycardia, unspecified: Secondary | ICD-10-CM | POA: Diagnosis present

## 2017-05-31 DIAGNOSIS — R627 Adult failure to thrive: Secondary | ICD-10-CM | POA: Diagnosis present

## 2017-05-31 DIAGNOSIS — I483 Typical atrial flutter: Principal | ICD-10-CM | POA: Diagnosis present

## 2017-05-31 DIAGNOSIS — I272 Pulmonary hypertension, unspecified: Secondary | ICD-10-CM | POA: Diagnosis not present

## 2017-05-31 DIAGNOSIS — I4510 Unspecified right bundle-branch block: Secondary | ICD-10-CM | POA: Diagnosis present

## 2017-05-31 HISTORY — DX: Unspecified atrial flutter: I48.92

## 2017-05-31 LAB — MAGNESIUM: Magnesium: 1.5 mg/dL — ABNORMAL LOW (ref 1.7–2.4)

## 2017-05-31 LAB — BASIC METABOLIC PANEL
ANION GAP: 13 (ref 5–15)
BUN: 38 mg/dL — AB (ref 6–20)
CALCIUM: 9 mg/dL (ref 8.9–10.3)
CO2: 28 mmol/L (ref 22–32)
Chloride: 93 mmol/L — ABNORMAL LOW (ref 101–111)
Creatinine, Ser: 1.24 mg/dL (ref 0.61–1.24)
GFR calc Af Amer: >60 mL/min (ref >60)
GFR calc non Af Amer: 56 mL/min — ABNORMAL LOW (ref >60)
GLUCOSE: 188 mg/dL — AB (ref 65–99)
POTASSIUM: 3.4 mmol/L — AB (ref 3.5–5.1)
Sodium: 134 mmol/L — ABNORMAL LOW (ref 135–145)

## 2017-05-31 LAB — CBC
HEMATOCRIT: 37.3 % — AB (ref 39.0–52.0)
HEMOGLOBIN: 12 g/dL — AB (ref 13.0–17.0)
MCH: 28.8 pg (ref 26.0–34.0)
MCHC: 32.2 g/dL (ref 30.0–36.0)
MCV: 89.7 fL (ref 78.0–100.0)
Platelets: 198 10*3/uL (ref 150–400)
RBC: 4.16 MIL/uL — ABNORMAL LOW (ref 4.22–5.81)
RDW: 17.5 % — ABNORMAL HIGH (ref 11.5–15.5)
WBC: 10.3 10*3/uL (ref 4.0–10.5)

## 2017-05-31 MED ORDER — DEXTROSE 5 % IV SOLN
5.0000 mg/h | INTRAVENOUS | Status: DC
  Filled 2017-05-31 (×2): qty 100

## 2017-05-31 MED ORDER — TERAZOSIN HCL 2 MG PO CAPS
2.0000 mg | ORAL_CAPSULE | Freq: Every day | ORAL | Status: DC
  Administered 2017-06-01 – 2017-06-02 (×2): 2 mg via ORAL
  Filled 2017-05-31 (×3): qty 1

## 2017-05-31 MED ORDER — DM-GUAIFENESIN ER 30-600 MG PO TB12
1.0000 | ORAL_TABLET | Freq: Two times a day (BID) | ORAL | Status: DC
  Administered 2017-06-01 – 2017-06-03 (×5): 1 via ORAL
  Filled 2017-05-31 (×5): qty 1

## 2017-05-31 MED ORDER — DILTIAZEM HCL 100 MG IV SOLR
5.0000 mg/h | INTRAVENOUS | Status: DC
  Administered 2017-05-31: 5 mg/h via INTRAVENOUS
  Filled 2017-05-31: qty 100

## 2017-05-31 MED ORDER — INSULIN ASPART 100 UNIT/ML ~~LOC~~ SOLN
0.0000 [IU] | Freq: Three times a day (TID) | SUBCUTANEOUS | Status: DC
  Administered 2017-06-01 – 2017-06-02 (×3): 2 [IU] via SUBCUTANEOUS
  Administered 2017-06-02: 5 [IU] via SUBCUTANEOUS
  Administered 2017-06-02: 3 [IU] via SUBCUTANEOUS

## 2017-05-31 MED ORDER — POTASSIUM CHLORIDE CRYS ER 20 MEQ PO TBCR
20.0000 meq | EXTENDED_RELEASE_TABLET | Freq: Every day | ORAL | Status: DC
  Administered 2017-06-01 – 2017-06-02 (×2): 20 meq via ORAL
  Filled 2017-05-31 (×2): qty 1

## 2017-05-31 MED ORDER — BUMETANIDE 2 MG PO TABS
2.0000 mg | ORAL_TABLET | Freq: Two times a day (BID) | ORAL | Status: DC
  Administered 2017-06-01 – 2017-06-03 (×4): 2 mg via ORAL
  Filled 2017-05-31 (×5): qty 1

## 2017-05-31 MED ORDER — LORATADINE 10 MG PO TABS
10.0000 mg | ORAL_TABLET | Freq: Every day | ORAL | Status: DC
  Administered 2017-06-01 – 2017-06-03 (×3): 10 mg via ORAL
  Filled 2017-05-31 (×3): qty 1

## 2017-05-31 MED ORDER — ALPRAZOLAM 0.25 MG PO TABS: 0.2500 mg | ORAL_TABLET | Freq: Two times a day (BID) | ORAL | Status: DC | PRN

## 2017-05-31 MED ORDER — PANTOPRAZOLE SODIUM 40 MG PO TBEC
40.0000 mg | DELAYED_RELEASE_TABLET | Freq: Every day | ORAL | Status: DC
  Administered 2017-06-01 – 2017-06-03 (×3): 40 mg via ORAL
  Filled 2017-05-31 (×3): qty 1

## 2017-05-31 MED ORDER — SODIUM CHLORIDE 0.9 % IV SOLN
1.0000 g | Freq: Once | INTRAVENOUS | Status: AC
  Administered 2017-05-31: 1 g via INTRAVENOUS
  Filled 2017-05-31: qty 10

## 2017-05-31 MED ORDER — FAMOTIDINE 20 MG PO TABS
20.0000 mg | ORAL_TABLET | Freq: Every day | ORAL | Status: DC
  Administered 2017-06-01 – 2017-06-02 (×2): 20 mg via ORAL
  Filled 2017-05-31 (×4): qty 1

## 2017-05-31 MED ORDER — PRO-STAT SUGAR FREE PO LIQD
30.0000 mL | Freq: Two times a day (BID) | ORAL | Status: DC
  Administered 2017-06-01 – 2017-06-03 (×4): 30 mL via ORAL
  Filled 2017-05-31 (×4): qty 30

## 2017-05-31 MED ORDER — ONDANSETRON HCL 4 MG/2ML IJ SOLN: 4.0000 mg | Freq: Four times a day (QID) | INTRAMUSCULAR | Status: DC | PRN

## 2017-05-31 MED ORDER — SODIUM CHLORIDE 0.9 % IV SOLN: 250.0000 mL | INTRAVENOUS | Status: DC | PRN

## 2017-05-31 MED ORDER — SODIUM CHLORIDE 0.9% FLUSH
3.0000 mL | Freq: Two times a day (BID) | INTRAVENOUS | Status: DC
  Administered 2017-06-01 – 2017-06-02 (×4): 3 mL via INTRAVENOUS

## 2017-05-31 MED ORDER — PREDNISONE 20 MG PO TABS
20.0000 mg | ORAL_TABLET | Freq: Every day | ORAL | Status: DC
  Administered 2017-06-01 – 2017-06-03 (×3): 20 mg via ORAL
  Filled 2017-05-31 (×3): qty 1

## 2017-05-31 MED ORDER — SODIUM CHLORIDE 0.9 % IV BOLUS (SEPSIS)
500.0000 mL | Freq: Once | INTRAVENOUS | Status: AC
  Administered 2017-05-31: 500 mL via INTRAVENOUS

## 2017-05-31 MED ORDER — DIGOXIN 0.25 MG/ML IJ SOLN
0.2500 mg | Freq: Once | INTRAMUSCULAR | Status: AC
  Administered 2017-06-01: 0.25 mg via INTRAVENOUS
  Filled 2017-05-31: qty 2

## 2017-05-31 MED ORDER — ACETAMINOPHEN 325 MG PO TABS: 650.0000 mg | ORAL_TABLET | ORAL | Status: DC | PRN

## 2017-05-31 MED ORDER — POTASSIUM CHLORIDE 10 MEQ/100ML IV SOLN
10.0000 meq | INTRAVENOUS | Status: AC
  Administered 2017-06-01: 10 meq via INTRAVENOUS
  Filled 2017-05-31 (×2): qty 100

## 2017-05-31 MED ORDER — INSULIN ASPART 100 UNIT/ML ~~LOC~~ SOLN
0.0000 [IU] | Freq: Every day | SUBCUTANEOUS | Status: DC
  Administered 2017-06-02: 3 [IU] via SUBCUTANEOUS

## 2017-05-31 MED ORDER — SODIUM CHLORIDE 0.9% FLUSH: 3.0000 mL | INTRAVENOUS | Status: DC | PRN

## 2017-05-31 MED ORDER — MAGNESIUM SULFATE 2 GM/50ML IV SOLN
2.0000 g | Freq: Once | INTRAVENOUS | Status: AC
  Administered 2017-05-31: 2 g via INTRAVENOUS
  Filled 2017-05-31: qty 50

## 2017-05-31 MED ORDER — APIXABAN 5 MG PO TABS
5.0000 mg | ORAL_TABLET | Freq: Two times a day (BID) | ORAL | Status: DC
  Administered 2017-06-01 – 2017-06-03 (×5): 5 mg via ORAL
  Filled 2017-05-31 (×5): qty 1

## 2017-05-31 NOTE — ED Provider Notes (Signed)
[facility] DEPT Provider Note   CSN: [id number] Arrival date & time: 05/31/17  2055     History   Chief Complaint Chief Complaint  Patient presents with  . Atrial Fibrillation    HPI Gilbert Dominguez is a 71 y.o. male.  HPI   Pt is a 71 y.o. male with a history of HFpEF (EF 55-60%), pulmonary fibrosis, pulmonary HTN, and atrial flutter presenting with a 2 day history of palpitations and shortness of breath. Pt reports this episode feels similar to the tachychardia he experienced a month ago, for which he was admitted and received PO cardizem for. He reports feeling generalized weakness and lightheadedness, particularly upon standing. Pt reports that his weight is down 4-5 pounds from his regular weight checks for CHF. Pt denies chest pain or tightness, cough, or wheezing. Pt reports no increased leg swelling but has significant edema at baseline. Pt reports nausea since yesterday. Pt reports that he restarted smoking 1 ppd a week ago for the first time since 1996.  Past Medical History:  Diagnosis Date  . CHF exacerbation ([place]) 02/24/2017  . Chronic respiratory failure ([place])   . Hypertension   . 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 Rheumatoid arthritis(714.0)     Patient Active Problem List   Diagnosis Date Noted  . High anion gap metabolic acidosis 40/98/1191  . Transaminitis 05/25/2017  . Positive D-dimer   . Atrial flutter with rapid ventricular response ([place])   . Pulmonary HTN ([place])   . Hypokalemia 04/28/2017  . Hyperglycemia 04/28/2017  . Atrial tachycardia ([place])   . Tachycardia 04/08/2017  . Acute on chronic diastolic CHF (congestive heart failure) ([place])   . Pulmonary hypertension ([place])   . 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 CKD (chronic kidney disease) stage 3, GFR 30-59 ml/min 02/25/2017  . Malnutrition of moderate degree 02/25/2017  . CHF exacerbation ([place]) 02/24/2017  . 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 Fluid overload 12/08/2016  . Elevated troponin 12/08/2016  . Hyperlipidemia 12/08/2016  . Anemia 12/08/2016  . Essential hypertension 11/19/2015  . Cough 09/05/2014  . Postinflammatory pulmonary fibrosis ([place]) 11/23/2013  . Dyspnea 11/23/2013  . Chronic respiratory failure with hypoxia ([place]) 11/23/2013    Past Surgical History:  Procedure Laterality Date  . ACNE CYST REMOVAL  1968  . [place] Medications    Prior to Admission medications   Medication Sig Start Date End Date Taking? Authorizing Provider  acetaminophen (TYLENOL) 500 MG tablet Take 1,000 mg by mouth every 6 (six) hours as needed for mild pain.   Yes [provider]  Amino Acids-Protein Hydrolys (FEEDING SUPPLEMENT, PRO-STAT SUGAR FREE 64,) LIQD Take 30 mLs by mouth 2 (two) times daily. 05/06/17  Yes Johnson, Clanford L, MD  apixaban (ELIQUIS) 5 MG TABS tablet Take 1 tablet (5 mg total) by mouth 2 (two) times daily. 05/06/17  Yes Johnson, Clanford L, MD  bumetanide (BUMEX) 2 MG tablet Take 1 tablet (2 mg total) by mouth 2 (two) times daily. 05/06/17  Yes Johnson, Clanford L, MD  Calcium Carbonate-Vit D-Min (CALCIUM 600+D PLUS MINERALS PO) Take 1 tablet by mouth 2 (two) times daily.   Yes [provider]  dextromethorphan-guaiFENesin (MUCINEX DM) 30-600 MG 12hr tablet Take 1 tablet by mouth 2 (two) times daily.    Yes [provider]  diltiazem (CARDIZEM CD) 120 MG 24 hr capsule Take 1 capsule (120 mg  total) by mouth daily. 05/06/17  Yes Johnson, Clanford L, MD  famotidine (PEPCID) 20 MG tablet Take 1 tablet (20 mg total) by mouth at bedtime. 01/27/17  Yes Tanda Rockers, MD  loratadine (CLARITIN) 10 MG tablet Take 10 mg by mouth daily.   Yes [provider]  metFORMIN (GLUCOPHAGE XR) 500 MG 24 hr tablet Take 2 tabs with breakfast and 2 tabs with supper Patient taking differently: Take 1,000 mg by mouth 2 (two) times daily.  05/06/17  Yes  Johnson, Clanford L, MD  OXYGEN Inhale 4-8 L into the lungs See admin instructions. 4 liters CONTINUOUSLY and to titrate up to 8 liters CONTINUOUSLY (with exertion) "to keep sats above 90" ([employer])   Yes [provider]  pantoprazole (PROTONIX) 40 MG tablet TAKE 1 TABLET BY MOUTH EVERY DAY *TAKE 30-60 MINUTES BEFORE FIRST MEAL OF THE DAY* Patient taking differently: TAKE 40MG BY MOUTH EVERY DAY *TAKE 30-60 MINUTES BEFORE FIRST MEAL OF THE DAY* 06/29/16  Yes Tanda Rockers, MD  potassium chloride SA (K-DUR,KLOR-CON) 20 MEQ tablet Take 1 tablet (20 mEq total) by mouth daily. 12/12/16  Yes Bonnielee Haff, MD  predniSONE (DELTASONE) 10 MG tablet Take 2 tablets (20 mg total) by mouth daily with breakfast. Until FU with Pulmonary MD 03/04/17  Yes Domenic Polite, MD  terazosin (HYTRIN) 2 MG capsule Take 2 mg by mouth at bedtime.   Yes [provider]    Family History Family History  Problem Relation Age of Onset  . Heart disease Father   . Emphysema Father   . Liver cancer Mother     Social History Social History  Substance Use Topics  . Smoking status: Former Smoker    Packs/day: 1.50    Years: 37.00    Types: Cigarettes    Quit date: 10/14/1995  . Smokeless tobacco: Never Used  . Alcohol use 1.8 oz/week    3 Cans of beer per week     Comment: occasionally     Allergies   Lovastatin   Review of Systems Review of Systems  Constitutional: Positive for activity change, appetite change and fatigue.  HENT: Negative for congestion and sinus pain.   Eyes: Negative for photophobia and visual disturbance.  Respiratory: Positive for shortness of breath. Negative for cough and wheezing.   Cardiovascular: Positive for palpitations and leg swelling. Negative for chest pain.  Gastrointestinal: Positive for nausea. Negative for abdominal pain, blood in stool and diarrhea.  Genitourinary: Negative for difficulty urinating and dysuria.  Musculoskeletal: Negative for  arthralgias.  Skin: Negative for color change and rash.  Neurological: Positive for dizziness and light-headedness. Negative for headaches.     Physical Exam Updated Vital Signs BP 101/83   Pulse (!) 49   Temp 97.9 F (36.6 C) (Oral)   Resp 17   SpO2 93%   Physical Exam  Constitutional: He appears distressed.  Thin appearing elderly male in moderate respiratory discomfort.   HENT:  Mouth/Throat: Oropharynx is clear and moist. No oropharyngeal exudate.  Eyes: Pupils are equal, round, and reactive to light. Conjunctivae are normal. No scleral icterus.  Neck: Neck supple.  Cardiovascular:  Tachycardia noted. No murmurs. 1+ distal pulses. 3+ pitting LE edema with associated rubor.  Pulmonary/Chest: He has no wheezes.  Increased work of breathing. Loud, coarse bibasilar crackles.  Abdominal: Soft. He exhibits no distension.  Musculoskeletal: He exhibits edema.  Neurological: He is alert.  Skin: Skin is warm and dry.     ED Treatments /  Results  Labs (all labs ordered are listed, but only abnormal results are displayed) Labs Reviewed  BASIC METABOLIC PANEL - Abnormal; Notable for the following:       Result Value   Sodium 134 (*)    Potassium 3.4 (*)    Chloride 93 (*)    Glucose, Bld 188 (*)    BUN 38 (*)    GFR calc non Af Amer 56 (*)    All other components within normal limits  CBC - Abnormal; Notable for the following:    RBC 4.16 (*)    Hemoglobin 12.0 (*)    HCT 37.3 (*)    RDW 17.5 (*)    All other components within normal limits  MAGNESIUM - Abnormal; Notable for the following:    Magnesium 1.5 (*)    All other components within normal limits  TSH  BASIC METABOLIC PANEL  CBC  TROPONIN I    EKG  EKG Interpretation  Date/Time:  Monday May 31 2017 20:56:51 EDT Ventricular Rate:  147 PR Interval:    QRS Duration: 104 QT Interval:  244 QTC Calculation: 381 R Axis:   122 Text Interpretation:  Suspect arm lead reversal, interpretation assumes no  reversal Atrial flutter with 2:1 A-V conduction Right axis deviation Incomplete right bundle branch block Right ventricular hypertrophy Nonspecific ST and T wave abnormality Abnormal ECG Since last tracing rate faster Confirmed by Deno Etienne [phone]) on 05/31/2017 11:03:12 PM       Radiology Dg Chest Portable 1 View  Result Date: 05/31/2017 CLINICAL DATA:  Acute onset of shortness of breath and tachycardia. Initial encounter. EXAM: PORTABLE CHEST 1 VIEW COMPARISON:  Chest radiograph performed 05/24/2017 FINDINGS: The lungs are well-aerated. Persistent right basilar and left-sided airspace opacities reflect the patient's known chronic fibrotic change. No superimposed focal airspace consolidation is seen. No pleural effusion or pneumothorax is identified. The cardiomediastinal silhouette is mildly enlarged. No acute osseous abnormalities are seen. IMPRESSION: 1. Chronic fibrotic change again noted, worse on the left. No superimposed focal airspace consolidation seen. 2. Mild cardiomegaly. Electronically Signed   By: Garald Balding M.D.   On: 05/31/2017 21:56    Procedures Procedures (including critical care time)  Medications Ordered in ED Medications  feeding supplement (PRO-STAT SUGAR FREE 64) liquid 30 mL (not administered)  apixaban (ELIQUIS) tablet 5 mg (0 mg Oral Hold 06/01/17 0108)  bumetanide (BUMEX) tablet 2 mg (not administered)  terazosin (HYTRIN) capsule 2 mg (0 mg Oral Hold 06/01/17 0109)  predniSONE (DELTASONE) tablet 20 mg (not administered)  dextromethorphan-guaiFENesin (MUCINEX DM) 30-600 MG per 12 hr tablet 1 tablet (0 tablets Oral Hold 06/01/17 0109)  famotidine (PEPCID) tablet 20 mg (0 mg Oral Hold 06/01/17 0109)  potassium chloride SA (K-DUR,KLOR-CON) CR tablet 20 mEq (not administered)  pantoprazole (PROTONIX) EC tablet 40 mg (not administered)  loratadine (CLARITIN) tablet 10 mg (not administered)  acetaminophen (TYLENOL) tablet 650 mg (not administered)  ondansetron  (ZOFRAN) injection 4 mg (not administered)  insulin aspart (novoLOG) injection 0-9 Units (not administered)  insulin aspart (novoLOG) injection 0-5 Units (not administered)  sodium chloride flush (NS) 0.9 % injection 3 mL (3 mLs Intravenous Given 06/01/17 0110)  sodium chloride flush (NS) 0.9 % injection 3 mL (not administered)  0.9 %  sodium chloride infusion (not administered)  diltiazem (CARDIZEM) 100 mg in dextrose 5 % 100 mL (1 mg/mL) infusion (15 mg/hr Intravenous Rate/Dose Change 06/01/17 0115)  ALPRAZolam (XANAX) tablet 0.25 mg (not administered)  potassium chloride 10 mEq in  100 mL IVPB (10 mEq Intravenous New Bag/Given 06/01/17 0059)  sodium chloride 0.9 % bolus 500 mL (0 mLs Intravenous Stopped 05/31/17 2301)  calcium gluconate 1 g in sodium chloride 0.9 % 100 mL IVPB (0 g Intravenous Stopped 05/31/17 2301)  magnesium sulfate IVPB 2 g 50 mL (0 g Intravenous Stopped 06/01/17 0009)  digoxin (LANOXIN) 0.25 MG/ML injection 0.25 mg (0.25 mg Intravenous Given 06/01/17 0055)     Initial Impression / Assessment and Plan / ED Course  I have reviewed the triage vital signs and the nursing notes.  Pertinent labs & imaging results that were available during Gilbert care of the patient were reviewed by me and considered in Gilbert medical decision making (see chart for details).   Clinical Course as of Jun 02 155  Mon May 31, 2017  2337 DG Chest Portable 1 View [AM]    Clinical Course User Index [AM] Tamala Julian   ED Course  Discussed patient with Deno Etienne, DO immediately after evaluation and proceeding with Cardizem. Defib pads placed d/t soft BP. 500 mL fluid bolus and Ca gluconate administered to manage BP with Cardizem drip.  2245. Pt's rate still in 130s-140s. Pt feeling significantly better. Respiratory effort unlabored. No wheezes. Coarse crackle present c/w pulmonary fibrosis.    Final Clinical Impressions(s) / ED Diagnoses   Final diagnoses:  Atrial flutter with rapid  ventricular response ([place])  Hypomagnesemia   MDM Pt is a 71 y.o. male with a history of HFpEF (EF 55-60%), pulmonary fibrosis, pulmonary HTN, and atrial flutter, presenting with a 2 day history of palpitations and shortness of breath. Differential diagnosis includes atrial flutter, Afib RVR, supraventricular tachycardia, flash pulmonary edema, COPD exacerbation, CHF exacerbation, MI, and PE. Initial EKG was ambiguous as to specific narrow complex tachycardia, but was most consistent with atrial flutter, for which he was hospitalized in 04/2017. Pt was given a Cardizem drip to slow rate. Pt remained stable despite consistent BP in systolic 46O and tolerated 500 mL bolus to maintain BP along with Ca gluconate. BP remained soft but stable with titration of Cardizem drip.  CXR showed cardiomegaly and opacities c/w pulmonary fibrosis. No pulmonary edema. Hypomagnesemia (1.5) addressed with supplementation. Patient's fluid status unchanged over course of treatment and pt felt significantly better with rate control. Pt admitted to [facility] for continued rate control and medication titration.  New Prescriptions New Prescriptions   No medications on file     Tamala Julian 06/01/17 [place], [place], DO 06/04/17 [phone]

## 2017-05-31 NOTE — H&P (Signed)
History and Physical    Gilbert Dominguez VOZ:[id number] DOB: [birth year] DOA: 05/31/2017  PCP: Leonard Downing, MD   Patient coming from: Home  Chief Complaint: Rapid heart rate   HPI: Gilbert Dominguez is a 71 y.o. male with medical history significant for pulmonary fibrosis with chronic hypoxic respiratory failure, chronic diastolic CHF, and atrial fibrillation on Eliquis, now presenting to the emergency department for evaluation of tachycardia. Patient reports that he had been in his usual state of health and was checking his O2 saturations at home 2 days ago when he noted his heart rate to be in the 140s on the pulse oximeter. He reports being asymptomatic with this. Over the ensuing 2 days, heart rate has persisted in the 140s when he checks his O2 saturation. He denies any chest pain, denies palpitations, and denies any worsening in his chronic dyspnea or chronic bilateral lower extremity edema. He has not attempted any interventions for these symptoms prior to coming in.  ED Course: Upon arrival to the ED, patient is found to be afebrile, saturating well on his usual 4 L per minute of supplemental oxygen, tachycardic in the 140s, and with blood pressure 92/78. EKG features atrial flutter with rate 147 and incomplete right bundle branch block. Chest x-ray is notable for chronic fibrotic changes, but no acute cardiopulmonary disease. Chemistry panel reveals a sodium of 134, potassium 3.4, and magnesium of 1.5. CBC is notable for a stable normocytic anemia with hemoglobin of 12.0. Patient was given 500 mL of normal saline, 2 g IV magnesium, 1 g calcium, and started on diltiazem infusion in the ED. Cardiology was consulted by the ED physician and advised for medical admission. She remained tachycardic in the ED, with blood pressure at the lower limits of normal, and no respiratory distress. He will be admitted to the stepdown unit for ongoing evaluation and management of atrial flutter with rapid  rate.   Review of Systems:  All other systems reviewed and apart from HPI, are negative.  Past Medical History:  Diagnosis Date  . CHF exacerbation ([place]) 02/24/2017  . Chronic respiratory failure ([place])   . Hypertension   . 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 Rheumatoid arthritis(714.0)     Past Surgical History:  Procedure Laterality Date  . ACNE CYST REMOVAL  1968  . VASECTOMY  1973     reports that he quit smoking about 21 years ago. His smoking use included Cigarettes. He has a 55.50 pack-year smoking history. He has never used smokeless tobacco. He reports that he drinks about 1.8 oz of alcohol per week . He reports that he does not use drugs.  Allergies  Allergen Reactions  . Lovastatin Rash    Family History  Problem Relation Age of Onset  . Heart disease Father   . Emphysema Father   . Liver cancer Mother      Prior to Admission medications   Medication Sig Start Date End Date Taking? Authorizing Provider  acetaminophen (TYLENOL) 500 MG tablet Take 1,000 mg by mouth every 6 (six) hours as needed for mild pain.   Yes [provider]  Amino Acids-Protein Hydrolys (FEEDING SUPPLEMENT, PRO-STAT SUGAR FREE 64,) LIQD Take 30 mLs by mouth 2 (two) times daily. 05/06/17  Yes Johnson, Clanford L, MD  apixaban (ELIQUIS) 5 MG TABS tablet Take 1 tablet (5 mg total) by mouth 2 (two) times daily. 05/06/17  Yes Johnson, Clanford L, MD  bumetanide (BUMEX) 2 MG tablet Take 1 tablet (2 mg total)  by mouth 2 (two) times daily. 05/06/17  Yes Johnson, Clanford L, MD  Calcium Carbonate-Vit D-Min (CALCIUM 600+D PLUS MINERALS PO) Take 1 tablet by mouth 2 (two) times daily.   Yes [provider]  dextromethorphan-guaiFENesin (MUCINEX DM) 30-600 MG 12hr tablet Take 1 tablet by mouth 2 (two) times daily.    Yes [provider]  diltiazem (CARDIZEM CD) 120 MG 24 hr capsule Take 1 capsule (120 mg total) by mouth daily. 05/06/17  Yes Johnson, Clanford L, MD    famotidine (PEPCID) 20 MG tablet Take 1 tablet (20 mg total) by mouth at bedtime. 01/27/17  Yes Tanda Rockers, MD  loratadine (CLARITIN) 10 MG tablet Take 10 mg by mouth daily.   Yes [provider]  metFORMIN (GLUCOPHAGE XR) 500 MG 24 hr tablet Take 2 tabs with breakfast and 2 tabs with supper Patient taking differently: Take 1,000 mg by mouth 2 (two) times daily.  05/06/17  Yes Johnson, Clanford L, MD  OXYGEN Inhale 4-8 L into the lungs See admin instructions. 4 liters CONTINUOUSLY and to titrate up to 8 liters CONTINUOUSLY (with exertion) "to keep sats above 90" ([employer])   Yes [provider]  pantoprazole (PROTONIX) 40 MG tablet TAKE 1 TABLET BY MOUTH EVERY DAY *TAKE 30-60 MINUTES BEFORE FIRST MEAL OF THE DAY* Patient taking differently: TAKE 40MG BY MOUTH EVERY DAY *TAKE 30-60 MINUTES BEFORE FIRST MEAL OF THE DAY* 06/29/16  Yes Tanda Rockers, MD  potassium chloride SA (K-DUR,KLOR-CON) 20 MEQ tablet Take 1 tablet (20 mEq total) by mouth daily. 12/12/16  Yes Bonnielee Haff, MD  predniSONE (DELTASONE) 10 MG tablet Take 2 tablets (20 mg total) by mouth daily with breakfast. Until FU with Pulmonary MD 03/04/17  Yes Domenic Polite, MD  terazosin (HYTRIN) 2 MG capsule Take 2 mg by mouth at bedtime.   Yes [provider]    Physical Exam: Vitals:   05/31/17 2057 05/31/17 2200 05/31/17 2212 05/31/17 2330  BP: _0 97/83  Pulse: (!) 148   (!) 49  Resp: 20 (!) 24 (!) 25 (!) 23  Temp: 97.9 F (36.6 C)     TempSrc: Oral     SpO2: 96%   93%      Constitutional: NAD, calm, cachectic Eyes: PERTLA, lids and conjunctivae normal ENMT: Mucous membranes are moist. Posterior pharynx clear of any exudate or lesions.   Neck: normal, supple, no masses, no thyromegaly Respiratory: Breath sounds diminished bilaterally. No wheezes. Mild dyspnea with speech. No accessory muscle use.  Cardiovascular: Rate ~150 and irregular. 2+ pretibial edema bilaterally. No  diaphoresis. Abdomen: No distension, no tenderness, no masses palpated. Bowel sounds normal.  Musculoskeletal: no clubbing / cyanosis. No joint deformity upper and lower extremities.   Skin: no significant rashes, lesions, ulcers. Warm, dry, well-perfused. Neurologic: CN 2-12 grossly intact. Sensation intact, DTR normal. Strength 5/5 in all 4 limbs.  Psychiatric: Alert and oriented x 3. Calm, cooperative.     Labs on Admission: I have personally reviewed following labs and imaging studies  CBC:  Recent Labs Lab 05/26/17 0438 05/31/17 2058  WBC 13.5* 10.3  HGB 11.3* 12.0*  HCT 33.0* 37.3*  MCV 88.9 89.7  PLT 172 557   Basic Metabolic Panel:  Recent Labs Lab 05/25/17 0227 05/25/17 0421 05/26/17 0438 05/31/17 2058 05/31/17 2121  NA 131* 131* 132* 134*  --   K 3.9 3.9 3.5 3.4*  --   CL 95* 95* 98* 93*  --   CO2 23  _0 --   GLUCOSE 130* 151* 177* 188*  --   BUN 42* 40* 37* 38*  --   CREATININE 1.46* 1.37* 0.97 1.24  --   CALCIUM 8.7* 8.4* 8.3* 9.0  --   MG  --   --   --   --  1.5*   GFR: Estimated Creatinine Clearance: 42.8 mL/min (by C-G formula based on SCr of 1.24 mg/dL). Liver Function Tests:  Recent Labs Lab 05/25/17 0421 05/26/17 0438  AST 2,743* 1,746*  ALT 1,544* 1,364*  ALKPHOS 123 158*  BILITOT 3.1* 1.8*  PROT 5.3* 5.4*  ALBUMIN 2.7* 2.5*   No results for input(s): LIPASE, AMYLASE in the last 168 hours. No results for input(s): AMMONIA in the last 168 hours. Coagulation Profile: No results for input(s): INR, PROTIME in the last 168 hours. Cardiac Enzymes: No results for input(s): CKTOTAL, CKMB, CKMBINDEX, TROPONINI in the last 168 hours. BNP (last 3 results)  Recent Labs  01/20/17 1642  PROBNP 1,867.0*   HbA1C: No results for input(s): HGBA1C in the last 72 hours. CBG:  Recent Labs Lab 05/25/17 0826 05/25/17 1217 05/25/17 1634 05/25/17 2103 05/26/17 0602  GLUCAP 90 166* 237* 133* 167*   Lipid Profile: No results for  input(s): CHOL, HDL, LDLCALC, TRIG, CHOLHDL, LDLDIRECT in the last 72 hours. Thyroid Function Tests: No results for input(s): TSH, T4TOTAL, FREET4, T3FREE, THYROIDAB in the last 72 hours. Anemia Panel: No results for input(s): VITAMINB12, FOLATE, FERRITIN, TIBC, IRON, RETICCTPCT in the last 72 hours. Urine analysis:    Component Value Date/Time   COLORURINE YELLOW 05/25/2017 0157   APPEARANCEUR HAZY (A) 05/25/2017 0157   LABSPEC 1.011 05/25/2017 0157   PHURINE 5.0 05/25/2017 0157   GLUCOSEU NEGATIVE 05/25/2017 0157   HGBUR MODERATE (A) 05/25/2017 0157   BILIRUBINUR NEGATIVE 05/25/2017 0157   KETONESUR NEGATIVE 05/25/2017 0157   PROTEINUR 100 (A) 05/25/2017 0157   NITRITE NEGATIVE 05/25/2017 0157   LEUKOCYTESUR NEGATIVE 05/25/2017 0157   Sepsis Labs: _1 (procalcitonin:4,lacticidven:4) )No results found for this or any previous visit (from the past 240 hour(s)).   Radiological Exams on Admission: Dg Chest Portable 1 View  Result Date: 05/31/2017 CLINICAL DATA:  Acute onset of shortness of breath and tachycardia. Initial encounter. EXAM: PORTABLE CHEST 1 VIEW COMPARISON:  Chest radiograph performed 05/24/2017 FINDINGS: The lungs are well-aerated. Persistent right basilar and left-sided airspace opacities reflect the patient's known chronic fibrotic change. No superimposed focal airspace consolidation is seen. No pleural effusion or pneumothorax is identified. The cardiomediastinal silhouette is mildly enlarged. No acute osseous abnormalities are seen. IMPRESSION: 1. Chronic fibrotic change again noted, worse on the left. No superimposed focal airspace consolidation seen. 2. Mild cardiomegaly. Electronically Signed   By: Garald Balding M.D.   On: 05/31/2017 21:56    EKG: Independently reviewed. Atrial flutter (rate 147), incomplete RBBB.   Assessment/Plan  1. Atrial flutter with RVR - Pt presents with palpitations, noted to be in atrial flutter with rate in 140's  - No chest  pain reported, will check a troponin; check TSH  - Noted to be hypokalemic and hypomagnesemic, replacing  - Cardiology consulting and much appreciated, will follow-up on recommendations  - He was started on diltiazem infusion in ED  - Continue diltiazem, titrated for goal HR 60-110, convert back to oral as tolerated  - CHADS-VASc is 52 (age, CHF, HTN, DM) and Eliquis will be continued    2. Acute on chronic diastolic CHF  - Pt appears hypervolemic on admission,  possibly secondary to the rapid rate  - No respiratory distress, and BP a little low, so not diuresed initially  - SLIV, follow daily wts and I/O's, continue Bumex, continue cardiac monitoring    3. Pulmonary fibrosis with chronic hypoxic respiratory failure  - Stable - Continue supplemental O2, chronic prednisone, and prn nebs   4. Hypokalemia, hypomagnesemia   - Serum potassium 3.4 on admission  - Magnesium level is 1.5 - Treated with 20 mEq IV potassium and 2 g IV magnesium, continue daily oral potassium, continue cardiac monitoring    5. Hypertension  - BP on the low side in ED - Continue diltiazem as tolerated  6. Type II DM  - A1c was 6.9% in July 2018  - Managed with metformin only at home - Check CBG's with meals and qHS  - Start a low-intensity sliding-scale Novolog    DVT prophylaxis: Eliquis Code Status: Full  Family Communication: Discussed with patient Disposition Plan: Admit to SDU Consults called: Cardiology Admission status: Inpatient    Vianne Bulls, MD [facility] Pager [phone]  If 7PM-7AM, please contact night-coverage [email or web address] Password [facility]  05/31/2017, 11:39 PM

## 2017-05-31 NOTE — ED Triage Notes (Signed)
Pt comes [facility], felt his heart beating fast earlier and checked his pulse which was fast, hx of afib, pt took 120 mg of Cardizem PO at 630 prior to EMS arrival. Pt in aflutter upon EMS arrival. No c/o of CP, states he had nausea and diarrhea last night. Pt wears 4L of O2 all the time.

## 2017-05-31 NOTE — ED Provider Notes (Signed)
Medical screening examination/treatment/procedure(s) were conducted as a shared visit with non-physician practitioner(s) and myself.  I personally evaluated the patient during the encounter.   EKG Interpretation  Date/Time:  Monday May 31 2017 20:56:51 EDT Ventricular Rate:  147 PR Interval:    QRS Duration: 104 QT Interval:  244 QTC Calculation: 381 R Axis:   122 Text Interpretation:   Suspect arm lead reversal, interpretation assumes no reversal Atrial flutter with 2:1 A-V conduction Right axis deviation Incomplete right bundle branch block Right ventricular hypertrophy Nonspecific ST and T wave abnormality Abnormal ECG Since last tracing rate faster Confirmed by Deno Etienne [phone]) on 05/31/2017 11:03:12 PM        See the written copy of this report in the patient's paper medical record.  These results did not interface directly into the electronic medical record and are summarized here.  71 yo M With a chief complaint of shortness of breath. This been going on for the past 3 days. The patient noticed that his heart was racing as well. He feels that his edema is at its baseline. On my exam the patient has 3+ pitting edema up above the knee. He has rales up to the base of the scapula. JVD up to mid neck. He has a regular tachycardia. Patient has an EKG consistent with atrial flutter with 2 to one block. Started on a diltiazem drip. Patient's blood pressures are somewhat soft.  His magnesium was noted to be low. Will replete. Discussed the case with cardiology, Dr. Percival Spanish, recommended hospitalist admission with cards consult.  CRITICAL CARE Performed by: Cecilio Asper   Total critical care time: 35 minutes  Critical care time was exclusive of separately billable procedures and treating other patients.  Critical care was necessary to treat or prevent imminent or life-threatening deterioration.  Critical care was time spent personally by me on the following activities:  development of treatment plan with patient and/or surrogate as well as nursing, discussions with consultants, evaluation of patient's response to treatment, examination of patient, obtaining history from patient or surrogate, ordering and performing treatments and interventions, ordering and review of laboratory studies, ordering and review of radiographic studies, pulse oximetry and re-evaluation of patient's condition.      Deno Etienne, DO 06/04/17 [phone]

## 2017-06-01 ENCOUNTER — Encounter (HOSPITAL_COMMUNITY): Payer: Self-pay | Admitting: Student

## 2017-06-01 DIAGNOSIS — N183 Chronic kidney disease, stage 3 (moderate): Secondary | ICD-10-CM

## 2017-06-01 LAB — CBC
HCT: 33.3 % — ABNORMAL LOW (ref 39.0–52.0)
HEMOGLOBIN: 10.8 g/dL — AB (ref 13.0–17.0)
MCH: 28.9 pg (ref 26.0–34.0)
MCHC: 32.4 g/dL (ref 30.0–36.0)
MCV: 89 fL (ref 78.0–100.0)
PLATELETS: 160 10*3/uL (ref 150–400)
RBC: 3.74 MIL/uL — ABNORMAL LOW (ref 4.22–5.81)
RDW: 17.5 % — AB (ref 11.5–15.5)
WBC: 10.8 10*3/uL — ABNORMAL HIGH (ref 4.0–10.5)

## 2017-06-01 LAB — MRSA PCR SCREENING: MRSA BY PCR: NEGATIVE

## 2017-06-01 LAB — BASIC METABOLIC PANEL
ANION GAP: 12 (ref 5–15)
BUN: 31 mg/dL — ABNORMAL HIGH (ref 6–20)
CALCIUM: 8.1 mg/dL — AB (ref 8.9–10.3)
CO2: 24 mmol/L (ref 22–32)
CREATININE: 0.85 mg/dL (ref 0.61–1.24)
Chloride: 102 mmol/L (ref 101–111)
GFR calc Af Amer: >60 mL/min (ref >60)
GLUCOSE: 126 mg/dL — AB (ref 65–99)
Potassium: 3.1 mmol/L — ABNORMAL LOW (ref 3.5–5.1)
Sodium: 138 mmol/L (ref 135–145)

## 2017-06-01 LAB — TSH: TSH: 3.042 u[IU]/mL (ref 0.350–4.500)

## 2017-06-01 LAB — TROPONIN I: TROPONIN I: 0.06 ng/mL — AB (ref <0.03)

## 2017-06-01 LAB — GLUCOSE, CAPILLARY
GLUCOSE-CAPILLARY: 202 mg/dL — AB (ref 65–99)
GLUCOSE-CAPILLARY: 157 mg/dL — AB (ref 65–99)
Glucose-Capillary: 186 mg/dL — ABNORMAL HIGH (ref 65–99)
Glucose-Capillary: 192 mg/dL — ABNORMAL HIGH (ref 65–99)

## 2017-06-01 MED ORDER — ENSURE ENLIVE PO LIQD
237.0000 mL | Freq: Two times a day (BID) | ORAL | Status: DC
  Administered 2017-06-01 – 2017-06-02 (×3): 237 mL via ORAL

## 2017-06-01 MED ORDER — DILTIAZEM HCL 60 MG PO TABS
60.0000 mg | ORAL_TABLET | Freq: Three times a day (TID) | ORAL | Status: DC
  Administered 2017-06-01 – 2017-06-02 (×4): 60 mg via ORAL
  Filled 2017-06-01 (×4): qty 1

## 2017-06-01 MED ORDER — POTASSIUM CHLORIDE CRYS ER 20 MEQ PO TBCR
40.0000 meq | EXTENDED_RELEASE_TABLET | Freq: Once | ORAL | Status: AC
  Administered 2017-06-01: 40 meq via ORAL
  Filled 2017-06-01: qty 2

## 2017-06-01 NOTE — ED Notes (Signed)
Breakfast tray at bedside 

## 2017-06-01 NOTE — Progress Notes (Signed)
PROGRESS NOTE  Gilbert Dominguez TKZ:[id number] DOB: November 09, 1945 DOA: 05/31/2017 PCP: Leonard Downing, MD  HPI/Recap of past 24 hours:  Heart rate better controlled now, denies chest pain, no sob at rest,+ bilateral lower extremity edema  Assessment/Plan: Principal Problem:   Atrial flutter with rapid ventricular response (HCC) Active Problems:   Postinflammatory pulmonary fibrosis (HCC)   Chronic respiratory failure with hypoxia (HCC)   Essential hypertension   Anemia   CKD (chronic kidney disease) stage 3, GFR 30-59 ml/min   Acute on chronic diastolic CHF (congestive heart failure) (HCC)   Hypokalemia  1. Atrial flutter with RVR - Pt presents with palpitations, noted to be in atrial flutter with rate in 140's  - No chest pain reported, mild and flat troponin; TSH  nwl - - He was started on diltiazem infusion in ED  - - CHADS-VASc is 10 (age, CHF, HTN, DM) and Eliquis will be continued   -cardiology consulted, will follow recommendations  2. Acute on chronic diastolic CHF  - Pt appears hypervolemic on admission, possibly secondary to the rapid rate  - No respiratory distress, and BP a little low, so not diuresed initially  - SLIV, follow daily wts and I/O's, continue Bumex,  -bilateral lower extremity pitting edema, patient report recent weight gain, diuretics per cardiology   3. Pulmonary fibrosis with chronic hypoxic respiratory failure on home o2 at 4liters/ immunosuppressed on chronic prednisone - Stable - Continue supplemental O2, chronic prednisone, and prn nebs   4. Hypokalemia, hypomagnesemia   - remain low, continue to replace  5. Hypertension  - BP on the low side in ED - Continue diltiazem as tolerated  6. Type II DM  - A1c was 6.9% in July 2018  - Managed with metformin only at home - Check CBG's with meals and qHS  - Start a low-intensity sliding-scale Novolog   7. Cigarette smoking: smoking cessation education provided  8. Alcohol use,  report drink a couple of beers daily  9; FTT: DOE , not able to walk for more than 32fets, recent multiple recurrent hospitalzation in 2018 will get PT/OT, will likely need home health  DVT prophylaxis: Eliquis Code Status: Full  Family Communication: Discussed with patient Disposition Plan: pending cardiology eval and clearance Consults called: Cardiology   Procedures:  none  Antibiotics:  none   Objective: BP 95/67   Pulse (!) 52   Temp 97.9 F (36.6 C) (Oral)   Resp 19   SpO2 100%   Intake/Output Summary (Last 24 hours) at 06/01/17 0924 Last data filed at 06/01/17 0256  Gross per 24 hour  Intake            775.8 ml  Output                0 ml  Net            775.8 ml   There were no vitals filed for this visit.  Exam: Patient is examined daily including today on 06/01/2017, exam remain the same as of yesterday except that is highlighted below   General:  Frail, NAD, on o2 supplement  Cardiovascular: IRRR  Respiratory: CTABL  Abdomen: Soft/ND/NT, positive BS  Musculoskeletal: 2-3+pitting Edema bilateral lower extremity up to knee  Neuro: aaox3  Data Reviewed: Basic Metabolic Panel:  Recent Labs Lab 05/26/17 0438 05/31/17 2058 05/31/17 2121 06/01/17 0314  NA 132* 134*  --  138  K 3.5 3.4*  --  3.1*  CL 98* 93*  --  102  CO2 26 28  --  24  GLUCOSE 177* 188*  --  126*  BUN 37* 38*  --  31*  CREATININE 0.97 1.24  --  0.85  CALCIUM 8.3* 9.0  --  8.1*  MG  --   --  1.5*  --    Liver Function Tests:  Recent Labs Lab 05/26/17 0438  AST 1,746*  ALT 1,364*  ALKPHOS 158*  BILITOT 1.8*  PROT 5.4*  ALBUMIN 2.5*   No results for input(s): LIPASE, AMYLASE in the last 168 hours. No results for input(s): AMMONIA in the last 168 hours. CBC:  Recent Labs Lab 05/26/17 0438 05/31/17 2058 06/01/17 0314  WBC 13.5* 10.3 10.8*  HGB 11.3* 12.0* 10.8*  HCT 33.0* 37.3* 33.3*  MCV 88.9 89.7 89.0  PLT 172 198 160   Cardiac Enzymes:    Recent  Labs Lab 06/01/17 0314  TROPONINI 0.06*   BNP (last 3 results)  Recent Labs  04/28/17 1725 05/09/17 0430 05/24/17 2334  BNP 2,262.7* 2,945.2* 2,092.3*    ProBNP (last 3 results)  Recent Labs  01/20/17 1642  PROBNP 1,867.0*    CBG:  Recent Labs Lab 05/25/17 1217 05/25/17 1634 05/25/17 2103 05/26/17 0602  GLUCAP 166* 237* 133* 167*    No results found for this or any previous visit (from the past 240 hour(s)).   Studies: Dg Chest Portable 1 View  Result Date: 05/31/2017 CLINICAL DATA:  Acute onset of shortness of breath and tachycardia. Initial encounter. EXAM: PORTABLE CHEST 1 VIEW COMPARISON:  Chest radiograph performed 05/24/2017 FINDINGS: The lungs are well-aerated. Persistent right basilar and left-sided airspace opacities reflect the patient's known chronic fibrotic change. No superimposed focal airspace consolidation is seen. No pleural effusion or pneumothorax is identified. The cardiomediastinal silhouette is mildly enlarged. No acute osseous abnormalities are seen. IMPRESSION: 1. Chronic fibrotic change again noted, worse on the left. No superimposed focal airspace consolidation seen. 2. Mild cardiomegaly. Electronically Signed   By: Garald Balding M.D.   On: 05/31/2017 21:56    Scheduled Meds: . apixaban  5 mg Oral BID  . bumetanide  2 mg Oral BID  . dextromethorphan-guaiFENesin  1 tablet Oral BID  . famotidine  20 mg Oral QHS  . feeding supplement (PRO-STAT SUGAR FREE 64)  30 mL Oral BID  . insulin aspart  0-5 Units Subcutaneous QHS  . insulin aspart  0-9 Units Subcutaneous TID WC  . loratadine  10 mg Oral Daily  . pantoprazole  40 mg Oral Daily  . potassium chloride SA  20 mEq Oral Daily  . predniSONE  20 mg Oral Q breakfast  . sodium chloride flush  3 mL Intravenous Q12H  . terazosin  2 mg Oral QHS    Continuous Infusions: . sodium chloride    . diltiazem (CARDIZEM) infusion 15 mg/hr (06/01/17 0115)     Time spent: 86mns I have personally  reviewed and interpreted daily labs, tele strips, imagings as discussed above under date review session and assessment and plans.  Case discussed with cardiology Dr HDebara Pickettin person, per Dr HDebara Pickett he will consult EP due to recurrent afib/rvr  Warren Kugelman MD, PhD  [facility] Pager [phone] If 7PM-7AM, please contact night-coverage at [email or web address], password [facility]8/14/2018, 9:24 AM  LOS: 1 day

## 2017-06-01 NOTE — ED Notes (Signed)
Breakfast tray ordered 

## 2017-06-01 NOTE — Consult Note (Signed)
Cardiology Consultation:   Patient ID: Gilbert Dominguez; [id number]; 04/08/1946   Admit date: 05/31/2017 Date of Consult: 06/01/2017  Primary Care Provider: Leonard Downing, MD Primary Cardiologist: Dr. Debara Pickett   Patient Profile:   Gilbert Dominguez is a 70 y.o. male with a history of paroxysmal atrial flutter (on Eliquis), chronic diastolic CHF, chronic respiratory failure (on 4L [place]), pulmonary fibrosis, pulmonary HTN, Stage 3 CKD, and Type 2 DM who is being seen today for the evaluation of atrial flutter with RVR at the request of Dr. Erlinda Hong.  History of Present Illness:   Gilbert Dominguez was admitted in 04/2017 for atrial flutter with RVR and experienced conversion to NSR while on IV Cardizem. Echocardiogram showed a preserved EF of 55-60% with no regional WMA. Was noted to have mild AS and moderate to severe TR. He was diuresed with IV Lasix and had an overall net output of -14L with a weight of 124 lbs. He was discharged on Bumex 33m BID along with Eliquis and Cardizem CD 1255mdaily.   Was again admitted from 8/6 - 05/26/2017 for acute on chronic respiratory failure. Creatinine was found to be elevated at 1.68 on admission but this improved to 0.97 at the time of discharge, therefore he was continued on his PTA medication regimen.   [facility] worsening dyspnea and palpitations over the past few days. He reports only feeling palpitations at night but he was checking his HR on his oximeter and HR was in the 130's - 140's. He notes associated dyspnea and lower extremity edema. No chest discomfort. Weights have been stable at 124-125 lbs on his home scales.   He has resumed smoking within the past month, now smoking 1 ppd. Consumes 2-3 beers per day. Denies any recreational drug use. Does not add extra salt to his food, but consumes frozen meals regularly.   Initial labs show WBC of 10.3, Hgb 12.0, platelets 198. Na+ 134, K+ 3.4, creatinine 1.24. Mg 1.5,  TSH 3.042, and initial troponin 0.06. CXR shows chronic fibrotic changes with no acute abnormalities. EKG shows atrial flutter with 2:1 conduction, HR 147.  He was started on IV Cardizem and continued on Eliquis for anticoagulation. Mg supplementation was ordered.    Past Medical History:  Diagnosis Date  . Atrial flutter ([facility]   a. initially diagnosed in 04/2017, started on Eliquis b. recurrent in 05/2017  . [place]F exacerbation ([facility]/06[place]2017  . Chronic [place] ([facility]  . Hypertension   . Pulmonary fibrosis, postinflammatory ([facility]  . Rheumatoid arthritis(714.0)     Past Surgical History:  Procedure Laterality Date  . ACNE CYST REMOVAL  1968  . VASECTOMY  1973     Inpatient Medications: Scheduled Meds: . apixaban  5 mg Oral BID  . bumetanide  2 mg Oral BID  . dextromethorphan-guaiFENesin  1 tablet Oral BID  . diltiazem  60 mg Oral Q8H  . famotidine  20 mg Oral QHS  . feeding supplement (PRO-STAT SUGAR FREE 64)  30 mL Oral BID  . insulin aspart  0-5 Units Subcutaneous QHS  . insulin aspart  0-9 Units Subcutaneous TID WC  . loratadine  10 mg Oral Daily  . pantoprazole  40 mg Oral Daily  . potassium chloride SA  20 mEq Oral Daily  . potassium chloride  40 mEq Oral Once  . predniSONE  20 mg Oral Q breakfast  . sodium chloride flush  3 mL Intravenous Q12H  .  terazosin  2 mg Oral QHS   Continuous Infusions: . sodium chloride     PRN Meds: sodium chloride, acetaminophen, ALPRAZolam, ondansetron (ZOFRAN) IV, sodium chloride flush  Allergies:    Allergies  Allergen Reactions  . Lovastatin Rash    Social History:   Social History   Social History  . Marital status: Single    Spouse name: N/A  . Number of children: 2  . Years of education: N/A   Occupational History  . Retired Optometrist    Social History Main Topics  . Smoking status: Former Smoker    Packs/day: 1.50    Years: 37.00    Types: Cigarettes    Quit date: 10/14/1995  . Smokeless  tobacco: Never Used  . Alcohol use 1.8 oz/week    3 Cans of beer per week     Comment: occasionally  . Drug use: No  . Sexual activity: Not on file   Other Topics Concern  . Not on file   Social History Narrative  . No narrative on file    Family History:    Family History  Problem Relation Age of Onset  . Heart disease Father   . Emphysema Father   . Liver cancer Mother      ROS:  Please see the history of present illness.  Review of Systems  Constitution: Negative for chills, decreased appetite, diaphoresis and fever.  Eyes: Negative for blurred vision and visual disturbance.  Cardiovascular: Positive for dyspnea on exertion, irregular heartbeat, leg swelling and palpitations. Negative for chest pain, claudication, cyanosis, orthopnea, paroxysmal nocturnal dyspnea and syncope.  Respiratory: Positive for shortness of breath. Negative for snoring and wheezing.   Gastrointestinal: Negative for bloating, constipation, diarrhea, hematemesis, hematochezia, melena, nausea and vomiting.  Genitourinary: Positive for frequency. Negative for dysuria.  Neurological: Negative for dizziness, light-headedness and loss of balance.    All other ROS reviewed and negative.     Physical Exam/Data:   Vitals:   06/01/17 0918 06/01/17 1000 06/01/17 1015 06/01/17 1100  BP: 110/73 92/74    Pulse: (!) 52  84   Resp: 20  20   Temp:      TempSrc:      SpO2: (!) 73% 92% 98%   Weight:    125 lb (56.7 kg)  Height:    _0  (1.6 m)    Intake/Output Summary (Last 24 hours) at 06/01/17 1219 Last data filed at 06/01/17 0256  Gross per 24 hour  Intake            775.8 ml  Output                0 ml  Net            775.8 ml   Filed Weights   06/01/17 1100  Weight: 125 lb (56.7 kg)   Body mass index is 22.14 kg/m.  General:  Well nourished, well developed Caucasian male appearing in NAD.  HEENT: normal Lymph: no adenopathy Neck: no JVD Endocrine:  No thryomegaly Vascular: No carotid  bruits; FA pulses 2+ bilaterally without bruits  Cardiac:  normal S1, S2; irregular with occasional ectopic beats; no murmur  Lungs:  clear to auscultation bilaterally, no wheezing, rhonchi or rales. On 4L [place].  Abd: soft, nontender, no hepatomegaly  Ext: 1+ pitting edema up to knees bilaterally Musculoskeletal:  No deformities, BUE and BLE strength normal and equal Skin: warm and dry  Neuro:  CNs 2-12 intact, no focal abnormalities noted  Psych:  Normal affect   EKG:  The EKG was personally reviewed and demonstrates: Atrial flutter with 2:1 conduction, HR 147.  Telemetry:  Telemetry was personally reviewed and demonstrates:  Atrial flutter with HR in the 80's - 90's.   Relevant CV Studies:  Echocardiogram: 04/2017 Study Conclusions  - Left ventricle: The cavity size was mildly reduced. Systolic   function was normal. The estimated ejection fraction was in the   range of 55% to 60%. Wall motion was normal; there were no   regional wall motion abnormalities. Doppler parameters are   consistent with abnormal left ventricular relaxation (grade 1   diastolic dysfunction). - Ventricular septum: The contour showed diastolic flattening and   systolic flattening. These changes are consistent with RV volume   and pressure overload. - Aortic valve: There was very mild stenosis. Valve area (VTI):   1.85 cm^2. Valve area (Vmax): 1.61 cm^2. Valve area (Vmean): 1.5   cm^2. - Right ventricle: The cavity size was severely dilated. Systolic   function was moderately to severely reduced. - Right atrium: The atrium was severely dilated. - Tricuspid valve: There was moderate-severe regurgitation directed   eccentrically. - Pulmonary arteries: Systolic pressure was severely increased. PA   peak pressure: 87 mm Hg (S).  Laboratory Data:  Chemistry  Recent Labs Lab 05/26/17 0438 05/31/17 2058 06/01/17 0314  NA 132* 134* 138  K 3.5 3.4* 3.1*  CL 98* 93* 102  CO2 _0 GLUCOSE 177* 188*  126*  BUN 37* 38* 31*  CREATININE 0.97 1.24 0.85  CALCIUM 8.3* 9.0 8.1*  GFRNONAA >60 56* >60  GFRAA >60 >60 >60  ANIONGAP _1 Recent Labs Lab 05/26/17 0438  PROT 5.4*  ALBUMIN 2.5*  AST 1,746*  ALT 1,364*  ALKPHOS 158*  BILITOT 1.8*   Hematology  Recent Labs Lab 05/26/17 0438 05/31/17 2058 06/01/17 0314  WBC 13.5* 10.3 10.8*  RBC 3.71* 4.16* 3.74*  HGB 11.3* 12.0* 10.8*  HCT 33.0* 37.3* 33.3*  MCV 88.9 89.7 89.0  MCH 30.5 28.8 28.9  MCHC 34.2 32.2 32.4  RDW 17.6* 17.5* 17.5*  PLT 172 198 160   Cardiac Enzymes  Recent Labs Lab 06/01/17 0314  TROPONINI 0.06*   No results for input(s): TROPIPOC in the last 168 hours.  BNPNo results for input(s): BNP, PROBNP in the last 168 hours.  DDimer No results for input(s): DDIMER in the last 168 hours.  Radiology/Studies:  Dg Chest Portable 1 View  Result Date: 05/31/2017 CLINICAL DATA:  Acute onset of shortness of breath and tachycardia. Initial encounter. EXAM: PORTABLE CHEST 1 VIEW COMPARISON:  Chest radiograph performed 05/24/2017 FINDINGS: The lungs are well-aerated. Persistent right basilar and left-sided airspace opacities reflect the patient's known chronic fibrotic change. No superimposed focal airspace consolidation is seen. No pleural effusion or pneumothorax is identified. The cardiomediastinal silhouette is mildly enlarged. No acute osseous abnormalities are seen. IMPRESSION: 1. Chronic fibrotic change again noted, worse on the left. No superimposed focal airspace consolidation seen. 2. Mild cardiomegaly. Electronically Signed   By: Garald Balding M.D.   On: 05/31/2017 21:56    Assessment and Plan:   1. Atrial Flutter with RVR - initially diagnosed with atrial flutter in 04/2017 at which time he converted to NSR while on IV Cardizem. Over the past 2 days, he has noticed worsening dyspnea and palpitations with HR being in the 130's - 140's on his home monitor.  - K+ and TSH are within  normal limits. Mg  at 1.5 (has been replaced).  - he reports consuming 2-3 beers daily. Recommended cessation of this and to limit caffeine intake with his recurrent arrhythmias.  - started on IV Cardizem at the time of admission with HR in the 70's - 80's at this time. Remains on 15 mL/hr with transient hypotension as SBP was in the 80's overnight. Will stop IV Cardizem and switch to short-acting PO Cardizem 46m Q8H. Anticipate switching back to Cardizem CD tomorrow if rates remain controlled (was on 1214mdaily PTA --> anticipate dose increase to 18046mr 240m88mily).  - This patients CHA2DS2-VASc Score and unadjusted Ischemic Stroke Rate (% per year) is equal to 3.2 % stroke rate/year from a score of 3 (HTN, DM, Age). Continue Eliquis 5mg 45m for anticoagulation.  2. Chronic Diastolic CHF - echo in 04/2036/6283ed a preserved EF of 55-60% with Grade 1 DD. No WMA.  - weight has been stable at 124 - 125 lbs on his home scales. CXR without acute findings but he still has 1+ pitting edema on examination which is chronic per the patient's report.  - continue with Bumex 2mg B65mas he is experiencing good urine output with this. Would benefit from wearing compression stockings long-term. He is clearly noncompliant with a low-sodium diet and consumes frozen meals regularly. The importance of sodium and fluid restriction was reviewed with the patient.   3. Pulmonary HTN/ Right Heart Failure - echo showed severely increased PA pressures of 87 mm Hg.  - continue diuretic dosing as above. Recent outpatient sleep study was negative for OSA.   4. Chronic respiratory failure/ Pulmonary Fibrosis - on 4L [place] at all times.   - followed by Dr. Wert iMelvyn Novase outpatient setting.   5. Stage 3 CKD - baseline creatinine 1.0-1.1.  - At 1.24 on admission and improved to 0.85 today.  6. Tobacco Use - he has resumed smoking 1 ppd. The importance of cessation was reviewed, especially in the setting of his severe pulmonary disease.     Signed, BrittaErma Heritage  06/01/2017 12:19 PM

## 2017-06-02 DIAGNOSIS — E876 Hypokalemia: Secondary | ICD-10-CM

## 2017-06-02 DIAGNOSIS — I4892 Unspecified atrial flutter: Secondary | ICD-10-CM

## 2017-06-02 DIAGNOSIS — J9611 Chronic respiratory failure with hypoxia: Secondary | ICD-10-CM

## 2017-06-02 DIAGNOSIS — I272 Pulmonary hypertension, unspecified: Secondary | ICD-10-CM

## 2017-06-02 DIAGNOSIS — I5033 Acute on chronic diastolic (congestive) heart failure: Secondary | ICD-10-CM

## 2017-06-02 LAB — CBC
HCT: 35.8 % — ABNORMAL LOW (ref 39.0–52.0)
Hemoglobin: 11.6 g/dL — ABNORMAL LOW (ref 13.0–17.0)
MCH: 29.3 pg (ref 26.0–34.0)
MCHC: 32.4 g/dL (ref 30.0–36.0)
MCV: 90.4 fL (ref 78.0–100.0)
PLATELETS: 214 10*3/uL (ref 150–400)
RBC: 3.96 MIL/uL — AB (ref 4.22–5.81)
RDW: 17.5 % — AB (ref 11.5–15.5)
WBC: 13.3 10*3/uL — AB (ref 4.0–10.5)

## 2017-06-02 LAB — BASIC METABOLIC PANEL
ANION GAP: 9 (ref 5–15)
BUN: 32 mg/dL — AB (ref 6–20)
CALCIUM: 8.8 mg/dL — AB (ref 8.9–10.3)
CO2: 26 mmol/L (ref 22–32)
Chloride: 98 mmol/L — ABNORMAL LOW (ref 101–111)
Creatinine, Ser: 0.98 mg/dL (ref 0.61–1.24)
GFR calc Af Amer: >60 mL/min (ref >60)
Glucose, Bld: 208 mg/dL — ABNORMAL HIGH (ref 65–99)
POTASSIUM: 4.1 mmol/L (ref 3.5–5.1)
SODIUM: 133 mmol/L — AB (ref 135–145)

## 2017-06-02 LAB — GLUCOSE, CAPILLARY
GLUCOSE-CAPILLARY: 183 mg/dL — AB (ref 65–99)
GLUCOSE-CAPILLARY: 225 mg/dL — AB (ref 65–99)
Glucose-Capillary: 152 mg/dL — ABNORMAL HIGH (ref 65–99)
Glucose-Capillary: 283 mg/dL — ABNORMAL HIGH (ref 65–99)
Glucose-Capillary: 224 mg/dL — ABNORMAL HIGH (ref 65–99)

## 2017-06-02 LAB — MAGNESIUM: MAGNESIUM: 1.6 mg/dL — AB (ref 1.7–2.4)

## 2017-06-02 MED ORDER — METOPROLOL TARTRATE 5 MG/5ML IV SOLN
5.0000 mg | Freq: Once | INTRAVENOUS | Status: AC
  Administered 2017-06-02: 5 mg via INTRAVENOUS
  Filled 2017-06-02: qty 5

## 2017-06-02 MED ORDER — MAGNESIUM SULFATE 2 GM/50ML IV SOLN
2.0000 g | Freq: Once | INTRAVENOUS | Status: AC
  Administered 2017-06-02: 2 g via INTRAVENOUS
  Filled 2017-06-02: qty 50

## 2017-06-02 MED ORDER — METOPROLOL TARTRATE 5 MG/5ML IV SOLN
5.0000 mg | Freq: Four times a day (QID) | INTRAVENOUS | Status: DC | PRN
  Administered 2017-06-02: 5 mg via INTRAVENOUS
  Filled 2017-06-02: qty 5

## 2017-06-02 MED ORDER — DILTIAZEM HCL 60 MG PO TABS
60.0000 mg | ORAL_TABLET | Freq: Four times a day (QID) | ORAL | Status: DC
  Administered 2017-06-02 – 2017-06-03 (×3): 60 mg via ORAL
  Filled 2017-06-02 (×3): qty 1

## 2017-06-02 NOTE — Plan of Care (Signed)
Problem: Education: Goal: Knowledge of [facility] Education information/materials will improve Outcome: Progressing Patient aware of plan of care.  RN provided medication education on medications administered prior to administration.  Patient stated understanding.

## 2017-06-02 NOTE — Consult Note (Signed)
Cardiology Consultation:   Patient ID: Gilbert Dominguez; [id number]; 1945/12/28   Admit date: 05/31/2017 Date of Consult: 06/02/2017  Primary Care Provider: Leonard Downing, MD Primary Cardiologist: Dr. Debara Pickett    Patient Profile:   Gilbert Dominguez is a 71 y.o. 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  History of Present Illness:   Mr. Howerter came 05/31/17 noting his HR 140's when he was monitoring his O2 sats, (no particular symptoms) though concerning and came in for evaluation.  He was found in AFlutter w/RVR, felt to have some fluid OL as well and admitted.  He was placed on diltiazem gtt for rate control and IV diuresis.  Initially mildly hypokalemia, mag was ok.    LABS  K+ 4.1 BUN/Creat 32/0.98 WBC 13.3 H/H 11/35 plts 214 TSH 3.042  He is feeling better today in comparison to admit, feeling more then usual SOB then, and at baseline today.  He has no heart awareness, even when his HR was 140's did not particularly feel palpitations,  Nove has had CP, no near syncope or syncope.   Past Medical History:  Diagnosis Date  . Atrial flutter ([place])    a. initially diagnosed in 04/2017, started on Eliquis b. recurrent in 05/2017  . CHF exacerbation ([place]) 02/24/2017  . Chronic respiratory failure ([place])   . Hypertension   . 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 Rheumatoid arthritis(714.0)     Past Surgical History:  Procedure Laterality Date  . ACNE CYST REMOVAL  1968  .  VASECTOMY  1973     Inpatient Medications: Scheduled Meds: . apixaban  5 mg Oral BID  . bumetanide  2 mg Oral BID  . dextromethorphan-guaiFENesin  1 tablet Oral BID  . diltiazem  60 mg Oral Q8H  . famotidine  20 mg Oral QHS  . feeding supplement (ENSURE ENLIVE)  237 mL Oral BID BM  . feeding supplement (PRO-STAT SUGAR FREE 64)  30 mL Oral BID  . insulin aspart  0-5 Units Subcutaneous QHS  . insulin aspart  0-9 Units Subcutaneous TID WC  . loratadine  10 mg Oral Daily  . pantoprazole  40 mg Oral Daily  . potassium chloride SA  20 mEq Oral Daily  . predniSONE  20 mg Oral Q breakfast  . sodium chloride flush  3 mL Intravenous Q12H  . terazosin  2 mg Oral QHS   Continuous Infusions: . sodium chloride     PRN Meds: sodium chloride, acetaminophen, ALPRAZolam, ondansetron (ZOFRAN) IV, sodium chloride flush  Allergies:    Allergies  Allergen Reactions  . Lovastatin Rash    Social History:   Social History   Social History  . Marital status: Single    Spouse name: N/A  . Number of children: 2  . Years of education: N/A   Occupational History  . Retired Optometrist    Social History Main Topics  . Smoking status: Former Smoker    Packs/day: 1.50    Years: 37.00    Types: Cigarettes    Quit date: 10/14/1995  .  Smokeless tobacco: Never Used  . Alcohol use 1.8 oz/week    3 Cans of beer per week     Comment: occasionally  . Drug use: No  . Sexual activity: Not on file   Other Topics Concern  . Not on file   Social History Narrative  . No narrative on file    Family History:    Family History  Problem Relation Age of Onset  . Heart disease Father   . Emphysema Father   . Liver cancer Mother      ROS:  Please see the history of present illness.  ROS  All other ROS reviewed and negative.     Physical Exam/Data:   Vitals:   06/01/17 1631 06/01/17 1951 06/02/17 0014 06/02/17 0404  BP: 108/71 108/70 98/76 102/66  Pulse: 89 (!) 101 (!) 127 (!) 106  Resp:  _0 Temp: 97.7 F (36.5 C) 97.7 F (36.5 C) 97.7 F (36.5 C) 97.7 F (36.5 C)  TempSrc: Oral  Oral   SpO2: 96% 94% 95% 94%  Weight:    126 lb (57.2 kg)  Height:        Intake/Output Summary (Last 24 hours) at 06/02/17 0742 Last data filed at 06/02/17 0407  Gross per 24 hour  Intake              960 ml  Output             1100 ml  Net             -140 ml   Filed Weights   06/01/17 1100 06/02/17 0404  Weight: 125 lb (56.7 kg) 126 lb (57.2 kg)   Body mass index is 22.32 kg/m.  General:  Well nourished, well developed,chronically ill appearing though in no acute distress HEENT: normal Lymph: no adenopathy Neck: no JVD Endocrine:  No thryomegaly Vascular: No carotid bruits Cardiac:  IRRR; 1-2/6SM, LSB Lungs:  dminished throughout, no wheezing, rhonchi or rales  Abd: soft, nontender, no hepatomegaly  Ext: 2+ edema, chronic looking skin changes Musculoskeletal:  No deformities, BUE and BLE strength normal and equal Skin: warm and dry  Neuro:  No gross focal abnormalities noted Psych:  Normal affect   EKG:  The EKG was personally reviewed and demonstrates:  AFlutter this AM rate 97/102, is atypical, QRS 132m Telemetry:  Telemetry was personally reviewed and demonstrates:  AFlutter, currently rate controlled 90's, has been 100's-140's  Relevant CV Studies:  05/01/17: TTE Study Conclusions - Left ventricle: The cavity size was mildly reduced. Systolic   function was normal. The estimated ejection fraction was in the   range of 55% to 60%. Wall motion was normal; there were no   regional wall motion abnormalities. Doppler parameters are   consistent with abnormal left ventricular relaxation (grade 1   diastolic dysfunction). - Ventricular septum: The contour showed diastolic flattening and   systolic flattening. These changes are consistent with RV volume   and pressure overload. - Aortic valve: There was very mild stenosis. Valve area (VTI):   1.85 cm^2. Valve  area (Vmax): 1.61 cm^2. Valve area (Vmean): 1.5   cm^2. - Right ventricle: The cavity size was severely dilated. Systolic   function was moderately to severely reduced. - Right atrium: The atrium was severely dilated. - Tricuspid valve: There was moderate-severe regurgitation directed   eccentrically. - Pulmonary arteries: Systolic pressure was severely increased. PA   peak pressure: 87 mm Hg (S).  LA 329m  12/08/16: TTE Impressions: - Normal LV size with EF 55-60%. D-shaped interventricular septum   suggestive of RV pressure/volume overload. 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 Severe pulmonary   hypertension. Mild aortic stenosis.   05/01/17: PFT Interpretation: The FVC is reduced, but the FEV1/FVC ratio is increased. The alveolar volume determined by the single breath diffusing capacity is larger than the total lung capacity. The lung volumes are reduced. Following administration of bronchodilators, there is no significant response. The reduced diffusing capacity indicates a severe loss of functional alveolar capillary surface. Pulmonary Function Diagnosis: Severe Restriction Severe Diffusion Defect   Laboratory Data:  Chemistry Recent Labs Lab 05/31/17 2058 06/01/17 0314 06/02/17 0355  NA 134* 138 133*  K 3.4* 3.1* 4.1  CL 93* 102 98*  CO2 _0 GLUCOSE 188* 126* 208*  BUN 38* 31* 32*  CREATININE 1.24 0.85 0.98  CALCIUM 9.0 8.1* 8.8*  GFRNONAA 56* >60 >60  GFRAA >60 >60 >60  ANIONGAP _1 No results for input(s): PROT, ALBUMIN, AST, ALT, ALKPHOS, BILITOT in the last 168 hours. Hematology Recent Labs Lab 05/31/17 2058 06/01/17 0314 06/02/17 0355  WBC 10.3 10.8* 13.3*  RBC 4.16* 3.74* 3.96*  HGB 12.0* 10.8* 11.6*  HCT 37.3* 33.3* 35.8*  MCV 89.7 89.0 90.4  MCH 28.8 28.9 29.3  MCHC 32.2 32.4 32.4  RDW 17.5* 17.5* 17.5*  PLT 198 160 214   Cardiac Enzymes Recent Labs Lab 06/01/17 0314  TROPONINI 0.06*   No results  for input(s): TROPIPOC in the last 168 hours.  BNPNo results for input(s): BNP, PROBNP in the last 168 hours.  DDimer No results for input(s): DDIMER in the last 168 hours.  Radiology/Studies:  Dg Chest Portable 1 View Result Date: 05/31/2017 CLINICAL DATA:  Acute onset of shortness of breath and tachycardia. Initial encounter. EXAM: PORTABLE CHEST 1 VIEW COMPARISON:  Chest radiograph performed 05/24/2017 FINDINGS: The lungs are well-aerated. Persistent right basilar and left-sided airspace opacities reflect the patient's known chronic fibrotic change. No superimposed focal airspace consolidation is seen. No pleural effusion or pneumothorax is identified. The cardiomediastinal silhouette is mildly enlarged. No acute osseous abnormalities are seen. IMPRESSION: 1. Chronic fibrotic change again noted, worse on the left. No superimposed focal airspace consolidation seen. 2. Mild cardiomegaly. Electronically Signed   By: Garald Balding M.D.   On: 05/31/2017 21:56    Assessment and Plan:   1. PAFlutter     During hospital stay early July he had had spontaneous CV to SR on dilt gtt     CHA2D2Vasc is at least 2, on Eliquis     Currently on Dilt 1m Q8 (IV discontinued yesterday)     IV lopressor x1 dose overnight, rates this AM are improved  AFlutter looks atypical He has severe pulmonary HTN, RA dilation and TR, severe O2 dependent ILD. given his severe pulmonary disease, is not likely a good ablation candidate very likely will have if not already eventually AFib  AAD options Would not consider amiodarone given severe pulm issues and last LFTs were >>[id number] certain 1c agents are on the table with RV dysfunction, will d/w Dr. KCaryl ComesRecurrent acute/chronic CHF would avoid sotalol SR QT looks OK for Tikosyn  However, if not ablated, even with AAD given anatomy and pulm issues, rhythm control success is not likely.  He has no clear symptoms with his AFlutter and rate control strategy may be best  option, though likely challenging as well.  Dr. Caryl Comes to see Continue management with primary team   Signed, Baldwin [place], PA-C  06/02/2017 7:42 AM   Patient seen and examined 06/02/17. This note is completed 8/16.  The patient has atrial flutter which is likely typical. He has a very abnormal right atrium and right ventricle related to pulmonary hypertension-Severe. I think the latter condition precludes an anesthetic procedure although pulmonary input would be necessary on this. This regard I have reached out to Dr. Melvyn Novas who confirms at least reluctance to proceed along that way. He also has informed us that the patient's prognosis is probably measured 12-24 months.  The management has atrial flutter is challenged by the above issues. First and foremost, the likelihood of a successful procedure given the deranged atrium is low and I suspect postprocedural atrial fibrillation would be very likely and the procedure itself given the very abnormal right atrium might be difficult.   Efforts at rate control would be appropriate. If this is adequate, the patient having had no palpitations with his tachycardia but only made aware of it by his pulse on his oximeter, I would favor ongoing rate control and anticoagulation.  In the event that it is not adequate, consideration could be given to an ablation although probably would need to be taken with his local anesthesia with the avoidance of sedation  LAD the help of a candidate future please call

## 2017-06-02 NOTE — Progress Notes (Signed)
Patient's HR sustaining >140. Patient is asymptomatic. Paged B Strader PA with this information; new medication orders received and in [facility].

## 2017-06-02 NOTE — Progress Notes (Signed)
For about the past hour patient's heart rate has sustained 120's-140s while patient resting in bed.  Per patient does feel more short of breath at this time.  Recent blood pressure 98/76.  Cardiology paged with this information via Amion.

## 2017-06-02 NOTE — Progress Notes (Signed)
Since x1 dose of IV push Metoprolol patient's heart rate primarily in the 90's.

## 2017-06-02 NOTE — Progress Notes (Signed)
While patient up to bedside commode heart rate 130's-140's.  Shortly after patient resting back in bed heart rate low 100's.  Will continue to monitor.

## 2017-06-02 NOTE — Progress Notes (Signed)
PT Cancellation Note  Patient Details Name: Gilbert Dominguez MRN: [id number] DOB: 1945-11-30   Cancelled Treatment:    Reason Eval/Treat Not Completed: Medical issues which prohibited therapy pt on bedrest. Will await increase in activity orders prior to PT evaluation.   [name] 06/02/2017, 9:36 AM Wray Kearns, PT, DPT [phone]

## 2017-06-02 NOTE — Evaluation (Signed)
Physical Therapy Evaluation Patient Details Name: Gilbert Dominguez MRN: [id number] DOB: [birth year] Today's Date: 06/02/2017   History of Present Illness  Patient is a 71 y/o male who presents with DOE and palpitation. Found to be in [place]. Recently admitted 8/6-8/8 due to respiratory failure. PMH includes pulmonary fibrosis, pulmonary HTN, RA, CHF, DM.  Clinical Impression  Patient presents with generalized weakness, dyspnea on exertion, decreased activity tolerance, endurance and impaired mobility s/p above. Tolerated gait training with min guard assist for safety. Sp02 dropped to 82% on 4-6L/min 02 and took a few mins to recover with rest. HR 102-136 bpm. BP soft- pre activity 99/78 and post activity 115/81. Pt with 3/4 DOE during ambulation. Encouraged short bouts of activity with longer rest breaks and education on energy conservation techniques. Pt Mod I PTA and helps care for wife. Will follow acutely to maximize independence and mobility prior to return home.     Follow Up Recommendations Other (comment);Supervision - Intermittent (cardiopulmonary OP rehab)    Equipment Recommendations  None recommended by PT    Recommendations for Other Services       Precautions / Restrictions Precautions Precautions: None Precaution Comments: watch HR and 02 Restrictions Weight Bearing Restrictions: No      Mobility  Bed Mobility               General bed mobility comments: Up in chair upon PT arrival.   Transfers Overall transfer level: Needs assistance Equipment used: Rolling walker (2 wheeled) Transfers: Sit to/from Stand Sit to Stand: Min guard         General transfer comment: Min guard to stand from low chair with multiple attempts and increased time. no assist needed.   Ambulation/Gait Ambulation/Gait assistance: Min guard Ambulation Distance (Feet): 140 Feet Assistive device: Rolling walker (2 wheeled) Gait Pattern/deviations: Step-through pattern;Decreased  stride length;Trunk flexed Gait velocity: decreased   General Gait Details: Slow, mostly steady gait. HR 102-140 bpm; Sp02 dropped to 82% on 4-6L/min 02. Cues for pursed lip breathing. Took a few mins for Sp02 to resolve >90%.  Stairs            Wheelchair Mobility    Modified Rankin (Stroke Patients Only)       Balance Overall balance assessment: Needs assistance Sitting-balance support: Feet supported;No upper extremity supported Sitting balance-Leahy Scale: Good Sitting balance - Comments: Able to doff and donn 1 sock but needed help with Left foot   Standing balance support: During functional activity;Bilateral upper extremity supported Standing balance-Leahy Scale: Poor Standing balance comment: Reliant on UEs for support in standing.                              Pertinent Vitals/Pain Pain Assessment: No/denies pain    Home Living Family/patient expects to be discharged to:: Private residence Living Arrangements: Spouse/significant other Available Help at Discharge: Family;Available PRN/intermittently Type of Home: House Home Access: Ramped entrance     Home Layout: One level Home Equipment: Shower seat;Grab bars - tub/shower;Walker - 2 wheels;Walker - 4 wheels Additional Comments: wife is on HD; pt typically drives wife to HD. daughter and son in law lives with them but they drive long distance truck so often gone. Son moving here soon to live with them     Prior Function Level of Independence: Independent with assistive device(s)         Comments: USes RW vs rollator PRN (whatever his wife is not using). Does  volunteering activities- hospice and honor guard. Uses 4L/min 02, but can go up to 8L as needed.      Hand Dominance        Extremity/Trunk Assessment   Upper Extremity Assessment Upper Extremity Assessment: Defer to OT evaluation    Lower Extremity Assessment Lower Extremity Assessment: Generalized weakness        Communication   Communication: HOH  Cognition Arousal/Alertness: Awake/alert Behavior During Therapy: WFL for tasks assessed/performed Overall Cognitive Status: Within Functional Limits for tasks assessed                                        General Comments General comments (skin integrity, edema, etc.): Tightness and swelling present LLE distal to knee and into foot.    Exercises     Assessment/Plan    PT Assessment Patient needs continued PT services  PT Problem List Decreased strength;Decreased mobility;Cardiopulmonary status limiting activity;Decreased activity tolerance;Decreased balance       PT Treatment Interventions Therapeutic activities;Gait training;Therapeutic exercise;Patient/family education;Balance training;Functional mobility training    PT Goals (Current goals can be found in the Care Plan section)  Acute Rehab PT Goals Patient Stated Goal: to feel better; plans to go on trip next month PT Goal Formulation: With patient Time For Goal Achievement: 06/16/17 Potential to Achieve Goals: Fair    Frequency Min 3X/week   Barriers to discharge Decreased caregiver support      Co-evaluation               AM-PAC PT "6 Clicks" Daily Activity  Outcome Measure Difficulty turning over in bed (including adjusting bedclothes, sheets and blankets)?: None Difficulty moving from lying on back to sitting on the side of the bed? : None Difficulty sitting down on and standing up from a chair with arms (e.g., wheelchair, bedside commode, etc,.)?: None Help needed moving to and from a bed to chair (including a wheelchair)?: A Little Help needed walking in hospital room?: A Little Help needed climbing 3-5 steps with a railing? : A Little 6 Click Score: 21    End of Session Equipment Utilized During Treatment: Oxygen;Gait belt Activity Tolerance: Treatment limited secondary to medical complications (Comment) (drop inSp02) Patient left: in  chair;with call bell/phone within reach Nurse Communication: Mobility status PT Visit Diagnosis: Other (comment);Muscle weakness (generalized) (M62.81) (DOE)    Time: 1021-1173 PT Time Calculation (min) (ACUTE ONLY): 23 min   Charges:   PT Evaluation $PT Eval Moderate Complexity: 1 Mod PT Treatments $Gait Training: 8-22 mins   PT G Codes:        Wray Kearns, PT, DPT [phone]    Gilbert Dominguez 06/02/2017, 12:08 PM

## 2017-06-02 NOTE — Plan of Care (Signed)
Problem: Safety: Goal: Ability to remain free from injury will improve Outcome: Progressing Per RN fall risk assessment patient is a high fall risk.  Patient is also taking Eliquis per this criteria patient qualifies for a low bed.  RN educated patient on this and patient refused low bed.  Patient stated he did not feel like changing to a new bed.  Patient is alert, and able to make needs known.  Patient has not thus far this shift attempted to get out of bed unassisted.  Patient has thus far this shift used call light to make needs known.

## 2017-06-02 NOTE — Progress Notes (Addendum)
PROGRESS NOTE    Gilbert Dominguez  CLE:[id number] DOB: 1946/06/03 DOA: 05/31/2017 PCP: Leonard Downing, MD   Brief Narrative: 71 y.o. male with medical history significant for pulmonary fibrosis with chronic hypoxic respiratory failure, chronic diastolic CHF, atrial fibrillation on Eliquis, presented to the emergency department for evaluation of tachycardia. In the ER patient was found to have heart rate of140s with blood pressure 92/78. EKG with atrial flutter with RVR.  Assessment & Plan:   # atrial flutter with RVR: Diltiazem drip. Currently on oral diltiazem. Continue Eliquis for systemic anticoagulation. Cardiology consult appreciated.EP evaluation ongoing.patient still has elevated heart rate.Continue to monitor in telemetry. -Likely atypical flutter as per card  #Acute on chronic diastolic congestive heart failure.currently on Bumex.echo with EF of 55-60% with normal wall motion.  #Pulmonary fibrosis with chronic hypoxic respiratory failure on home O2 at 4 L/immunosuppressed on chronic prednisone: Patient is stable. Continue supplemental oxygen, prednisone and nebulization as needed.  #Hypokalemia, hypomagnesemia:repleted magnesium. Monitor labs.continue potassium chloride.  #Hypertension:monitor blood pressure. Continue Bumex, diltiazem  #Type 2 diabetes: On sliding scale. Monitor blood sugar level  #acute kidney injury: Improved.  DVT prophylaxis: Eliquis Code Status:full code Family Communication:o family at bedside. Disposition Plan:likely discharge home in 1-2 days    Consultants:   cardiology  Procedures:none Antimicrobials:none  Subjective: Seen and examined at bedside. Feeling good today. Her headache, dizziness, nausea vomiting chest pain or shortness of breath. No abdominal pain.  Objective: Vitals:   06/02/17 0404 06/02/17 0759 06/02/17 1114 06/02/17 1200  BP: 102/66 102/70 103/78 115/81  Pulse: (!) 106 95 (!) 109 (!) 132  Resp: 17 16 (!) 28 18    Temp: 97.7 F (36.5 C)  97.8 F (36.6 C)   TempSrc:   Oral   SpO2: 94% 95% 90% 90%  Weight: 57.2 kg (126 lb)     Height:        Intake/Output Summary (Last 24 hours) at 06/02/17 1348 Last data filed at 06/02/17 1100  Gross per 24 hour  Intake              960 ml  Output             1400 ml  Net             -440 ml   Filed Weights   06/01/17 1100 06/02/17 0404  Weight: 56.7 kg (125 lb) 57.2 kg (126 lb)    Examination:  General exam: Appears calm and comfortable  Respiratory system: Clear to auscultation. Respiratory effort normal. No wheezing or crackle Cardiovascular system: S1 & S2 heard,irregular and tachycardic.  Bilateral trace edema. Gastrointestinal system: Abdomen is nondistended, soft and nontender. Normal bowel sounds heard. Central nervous system: Alert and oriented. No focal neurological deficits. Extremities: Symmetric 5 x 5 power. Skin: No rashes, lesions or ulcers Psychiatry: Judgement and insight appear normal. Mood & affect appropriate.     Data Reviewed: I have personally reviewed following labs and imaging studies  CBC:  Recent Labs Lab 05/31/17 2058 06/01/17 0314 06/02/17 0355  WBC 10.3 10.8* 13.3*  HGB 12.0* 10.8* 11.6*  HCT 37.3* 33.3* 35.8*  MCV 89.7 89.0 90.4  PLT 198 160 944   Basic Metabolic Panel:  Recent Labs Lab 05/31/17 2058 05/31/17 2121 06/01/17 0314 06/02/17 0355  NA 134*  --  138 133*  K 3.4*  --  3.1* 4.1  CL 93*  --  102 98*  CO2 28  --  24 26  GLUCOSE 188*  --  126* 208*  BUN 38*  --  31* 32*  CREATININE 1.24  --  0.85 0.98  CALCIUM 9.0  --  8.1* 8.8*  MG  --  1.5*  --  1.6*   GFR: Estimated Creatinine Clearance: 54.8 mL/min (by C-G formula based on SCr of 0.98 mg/dL). Liver Function Tests: No results for input(s): AST, ALT, ALKPHOS, BILITOT, PROT, ALBUMIN in the last 168 hours. No results for input(s): LIPASE, AMYLASE in the last 168 hours. No results for input(s): AMMONIA in the last 168  hours. Coagulation Profile: No results for input(s): INR, PROTIME in the last 168 hours. Cardiac Enzymes:  Recent Labs Lab 06/01/17 0314  TROPONINI 0.06*   BNP (last 3 results)  Recent Labs  01/20/17 1642  PROBNP 1,867.0*   HbA1C: No results for input(s): HGBA1C in the last 72 hours. CBG:  Recent Labs Lab 06/01/17 1957 06/01/17 2220 06/02/17 0016 06/02/17 0759 06/02/17 1112  GLUCAP 202* 157* 152* 183* 225*   Lipid Profile: No results for input(s): CHOL, HDL, LDLCALC, TRIG, CHOLHDL, LDLDIRECT in the last 72 hours. Thyroid Function Tests:  Recent Labs  06/01/17 0314  TSH 3.042   Anemia Panel: No results for input(s): VITAMINB12, FOLATE, FERRITIN, TIBC, IRON, RETICCTPCT in the last 72 hours. Sepsis Labs: No results for input(s): PROCALCITON, LATICACIDVEN in the last 168 hours.  Recent Results (from the past 240 hour(s))  MRSA PCR Screening     Status: None   Collection Time: 06/01/17 11:36 AM  Result Value Ref Range Status   MRSA by PCR NEGATIVE NEGATIVE Final    Comment:        The GeneXpert MRSA Assay (FDA approved for NASAL specimens only), is one component of a comprehensive MRSA colonization surveillance program. It is not intended to diagnose MRSA infection nor to guide or monitor treatment for MRSA infections.          Radiology Studies: Dg Chest Portable 1 View  Result Date: 05/31/2017 CLINICAL DATA:  Acute onset of shortness of breath and tachycardia. Initial encounter. EXAM: PORTABLE CHEST 1 VIEW COMPARISON:  Chest radiograph performed 05/24/2017 FINDINGS: The lungs are well-aerated. Persistent right basilar and left-sided airspace opacities reflect the patient's known chronic fibrotic change. No superimposed focal airspace consolidation is seen. No pleural effusion or pneumothorax is identified. The cardiomediastinal silhouette is mildly enlarged. No acute osseous abnormalities are seen. IMPRESSION: 1. Chronic fibrotic change again noted,  worse on the left. No superimposed focal airspace consolidation seen. 2. Mild cardiomegaly. Electronically Signed   By: Garald Balding M.D.   On: 05/31/2017 21:56        Scheduled Meds: . apixaban  5 mg Oral BID  . bumetanide  2 mg Oral BID  . dextromethorphan-guaiFENesin  1 tablet Oral BID  . diltiazem  60 mg Oral Q8H  . famotidine  20 mg Oral QHS  . feeding supplement (ENSURE ENLIVE)  237 mL Oral BID BM  . feeding supplement (PRO-STAT SUGAR FREE 64)  30 mL Oral BID  . insulin aspart  0-5 Units Subcutaneous QHS  . insulin aspart  0-9 Units Subcutaneous TID WC  . loratadine  10 mg Oral Daily  . pantoprazole  40 mg Oral Daily  . potassium chloride SA  20 mEq Oral Daily  . predniSONE  20 mg Oral Q breakfast  . sodium chloride flush  3 mL Intravenous Q12H  . terazosin  2 mg Oral QHS   Continuous Infusions: . sodium chloride       LOS:  2 days    Dron Tanna Furry, MD [facility] Pager [phone]  If 7PM-7AM, please contact night-coverage [email or web address] Password [email or web address] 06/02/2017, 1:48 PM

## 2017-06-02 NOTE — Progress Notes (Signed)
Initial Nutrition Assessment  DOCUMENTATION CODES:   Severe malnutrition in context of chronic illness  INTERVENTION:    Ensure Enlive PO BID, each supplement provides 350 kcal and 20 grams of protein  Pro-stat 30 ml PO BID, each supplement provides 100 kcal and 15 gm protein  NUTRITION DIAGNOSIS:   Malnutrition (severe) related to chronic illness (CHF, pulmonary fibrosis) as evidenced by severe depletion of muscle mass, severe depletion of body fat, moderate to severe fluid accumulation.  GOAL:   Patient will meet greater than or equal to 90% of their needs  MONITOR:   PO intake, Supplement acceptance  REASON FOR ASSESSMENT:   Malnutrition Screening Tool    ASSESSMENT:   71 yo male with PMH of rheumatoid arthritis, HTN, chronic respiratory failure, CHF, pulmonary fibrosis who was admitted on 8/13 with A flutter.   Patient reports he has lost weight, unsure how much. He drinks Ensure supplements, likes all flavors. Meal completion is good, he is consuming 50-100% of meals. Nutrition-Focused physical exam completed. Findings are mild/moderate and severe fat depletion, mild/moderate and severe muscle depletion, and moderate edema.  5% weight loss in 3 months is not significant for the time frame. Labs reviewed: sodium 133 (L), magnesium 1.6 (L) CBG's: [phone] Medications reviewed and include KCl.  Diet Order:  Diet heart healthy/carb modified Room service appropriate? Yes; Fluid consistency: Thin  Skin:   (L arm wound)  Last BM:  8/14  Height:   Ht Readings from Last 1 Encounters:  06/01/17 5' 3" (1.6 m)    Weight:   Wt Readings from Last 1 Encounters:  06/02/17 126 lb (57.2 kg)    Ideal Body Weight:  56.4 kg  BMI:  Body mass index is 22.32 kg/m.  Estimated Nutritional Needs:   Kcal:  1700-1900  Protein:  80-90 gm  Fluid:  1.7-1.9 L  EDUCATION NEEDS:   No education needs identified at this time  Molli Barrows, [place], [place], [place] Pager  [phone] After Hours Pager [phone]

## 2017-06-03 DIAGNOSIS — I10 Essential (primary) hypertension: Secondary | ICD-10-CM

## 2017-06-03 LAB — BASIC METABOLIC PANEL
Anion gap: 9 (ref 5–15)
BUN: 32 mg/dL — AB (ref 6–20)
CALCIUM: 9 mg/dL (ref 8.9–10.3)
CO2: 29 mmol/L (ref 22–32)
Chloride: 96 mmol/L — ABNORMAL LOW (ref 101–111)
Creatinine, Ser: 0.88 mg/dL (ref 0.61–1.24)
GFR calc Af Amer: >60 mL/min (ref >60)
GLUCOSE: 135 mg/dL — AB (ref 65–99)
Potassium: 3.3 mmol/L — ABNORMAL LOW (ref 3.5–5.1)
Sodium: 134 mmol/L — ABNORMAL LOW (ref 135–145)

## 2017-06-03 LAB — MAGNESIUM: Magnesium: 2 mg/dL (ref 1.7–2.4)

## 2017-06-03 LAB — GLUCOSE, CAPILLARY: Glucose-Capillary: 121 mg/dL — ABNORMAL HIGH (ref 65–99)

## 2017-06-03 MED ORDER — DILTIAZEM HCL ER COATED BEADS 240 MG PO CP24: 240.0000 mg | ORAL_CAPSULE | Freq: Every day | ORAL | 0 refills | Status: DC

## 2017-06-03 MED ORDER — DILTIAZEM HCL ER COATED BEADS 240 MG PO CP24
240.0000 mg | ORAL_CAPSULE | Freq: Every day | ORAL | Status: DC
  Administered 2017-06-03: 240 mg via ORAL
  Filled 2017-06-03: qty 1

## 2017-06-03 MED ORDER — MAGNESIUM SULFATE 2 GM/50ML IV SOLN: 2.0000 g | Freq: Once | INTRAVENOUS | Status: DC

## 2017-06-03 MED ORDER — POTASSIUM CHLORIDE CRYS ER 20 MEQ PO TBCR
40.0000 meq | EXTENDED_RELEASE_TABLET | Freq: Every day | ORAL | Status: DC
  Administered 2017-06-03: 40 meq via ORAL
  Filled 2017-06-03: qty 2

## 2017-06-03 NOTE — Progress Notes (Signed)
PROGRESS NOTE    Gilbert Dominguez  TMA:[id number] DOB: April 09, 1946 DOA: 05/31/2017 PCP: Leonard Downing, MD   Brief Narrative: 71 y.o. male with medical history significant for pulmonary fibrosis with chronic hypoxic respiratory failure, chronic diastolic CHF, atrial fibrillation on Eliquis, presented to the emergency department for evaluation of tachycardia. In the ER patient was found to have heart rate of140s with blood pressure 92/78. EKG with atrial flutter with RVR.  Assessment & Plan:   # atrial flutter with RVR: Off Diltiazem drip.  -Heart rate is still elevated. Cardiologist changed to Cardizem to 240 mg. Continue to monitor heart rate and blood pressure closely. Continue Eliquis for systemic anticoagulation. Cardiology consult and EP evaluation appreciated, now focus on rate control. -Likely has typical flutter as per EP note.  #Acute on chronic diastolic congestive heart failure.currently on Bumex.echo with EF of 55-60% with normal wall motion. Patient has chronic lower extremities edema.  #Pulmonary fibrosis with chronic hypoxic respiratory failure on home O2 at 4 L/immunosuppressed on chronic prednisone: Patient is stable. Continue supplemental oxygen, prednisone and nebulization as needed.  #Hypokalemia, hypomagnesemia:repleted magnesium. Monitor labs.increased the dose of potassium chloride 240 mEq. Repeat lab in the morning.  #Hypertension:monitor blood pressure. Continue Bumex, diltiazem  #Type 2 diabetes: On sliding scale. Monitor blood sugar level  #acute kidney injury: Improved.  DVT prophylaxis: Eliquis Code Status:full code Family Communication:o family at bedside. Disposition Plan:likely discharge home in 1-2 days    Consultants:   cardiology  Procedures:none Antimicrobials:none  Subjective: Seen and examined at bedside. Denied headache, dizziness, chest pain, shortness of breath, cough. No nausea or vomiting. Objective: Vitals:   06/03/17 0433  06/03/17 0531 06/03/17 0635 06/03/17 0700  BP: 104/81 106/78    Pulse: (!) 122 (!) 108 92   Resp: (!) 25 20    Temp: 98 F (36.7 C)   97.9 F (36.6 C)  TempSrc:    Oral  SpO2: 90% 98%    Weight: 56.9 kg (125 lb 6.4 oz)     Height:        Intake/Output Summary (Last 24 hours) at 06/03/17 1142 Last data filed at 06/03/17 0436  Gross per 24 hour  Intake             1010 ml  Output             1950 ml  Net             -940 ml   Filed Weights   06/01/17 1100 06/02/17 0404 06/03/17 0433  Weight: 56.7 kg (125 lb) 57.2 kg (126 lb) 56.9 kg (125 lb 6.4 oz)    Examination:  General exam: Sitting on chair comfortable Respiratory system: Clear bilateral. Respiratory effort normal. No wheezing or crackle Cardiovascular system: Irregular tachycardia, S1-S2 normal. Bilateral lower extremities pitting edema Gastrointestinal system: Abdomen is nondistended, soft and nontender. Normal bowel sounds heard. Central nervous system: Alert and oriented. No focal neurological deficits. Skin: No rashes, lesions or ulcers Psychiatry: Judgement and insight appear normal. Mood & affect appropriate.     Data Reviewed: I have personally reviewed following labs and imaging studies  CBC:  Recent Labs Lab 05/31/17 2058 06/01/17 0314 06/02/17 0355  WBC 10.3 10.8* 13.3*  HGB 12.0* 10.8* 11.6*  HCT 37.3* 33.3* 35.8*  MCV 89.7 89.0 90.4  PLT 198 160 256   Basic Metabolic Panel:  Recent Labs Lab 05/31/17 2058 05/31/17 2121 06/01/17 0314 06/02/17 0355 06/03/17 0256  NA 134*  --  138 133* 134*  K 3.4*  --  3.1* 4.1 3.3*  CL 93*  --  102 98* 96*  CO2 28  --  _0 GLUCOSE 188*  --  126* 208* 135*  BUN 38*  --  31* 32* 32*  CREATININE 1.24  --  0.85 0.98 0.88  CALCIUM 9.0  --  8.1* 8.8* 9.0  MG  --  1.5*  --  1.6* 2.0   GFR: Estimated Creatinine Clearance: 61.1 mL/min (by C-G formula based on SCr of 0.88 mg/dL). Liver Function Tests: No results for input(s): AST, ALT, ALKPHOS,  BILITOT, PROT, ALBUMIN in the last 168 hours. No results for input(s): LIPASE, AMYLASE in the last 168 hours. No results for input(s): AMMONIA in the last 168 hours. Coagulation Profile: No results for input(s): INR, PROTIME in the last 168 hours. Cardiac Enzymes:  Recent Labs Lab 06/01/17 0314  TROPONINI 0.06*   BNP (last 3 results)  Recent Labs  01/20/17 1642  PROBNP 1,867.0*   HbA1C: No results for input(s): HGBA1C in the last 72 hours. CBG:  Recent Labs Lab 06/02/17 0759 06/02/17 1112 06/02/17 1634 06/02/17 2128 06/03/17 0742  GLUCAP 183* 225* 283* 224* 121*   Lipid Profile: No results for input(s): CHOL, HDL, LDLCALC, TRIG, CHOLHDL, LDLDIRECT in the last 72 hours. Thyroid Function Tests:  Recent Labs  06/01/17 0314  TSH 3.042   Anemia Panel: No results for input(s): VITAMINB12, FOLATE, FERRITIN, TIBC, IRON, RETICCTPCT in the last 72 hours. Sepsis Labs: No results for input(s): PROCALCITON, LATICACIDVEN in the last 168 hours.  Recent Results (from the past 240 hour(s))  MRSA PCR Screening     Status: None   Collection Time: 06/01/17 11:36 AM  Result Value Ref Range Status   MRSA by PCR NEGATIVE NEGATIVE Final    Comment:        The GeneXpert MRSA Assay (FDA approved for NASAL specimens only), is one component of a comprehensive MRSA colonization surveillance program. It is not intended to diagnose MRSA infection nor to guide or monitor treatment for MRSA infections.          Radiology Studies: No results found.      Scheduled Meds: . apixaban  5 mg Oral BID  . bumetanide  2 mg Oral BID  . dextromethorphan-guaiFENesin  1 tablet Oral BID  . diltiazem  240 mg Oral Daily  . famotidine  20 mg Oral QHS  . feeding supplement (ENSURE ENLIVE)  237 mL Oral BID BM  . feeding supplement (PRO-STAT SUGAR FREE 64)  30 mL Oral BID  . insulin aspart  0-5 Units Subcutaneous QHS  . insulin aspart  0-9 Units Subcutaneous TID WC  . loratadine  10 mg  Oral Daily  . pantoprazole  40 mg Oral Daily  . potassium chloride SA  40 mEq Oral Daily  . predniSONE  20 mg Oral Q breakfast  . sodium chloride flush  3 mL Intravenous Q12H  . terazosin  2 mg Oral QHS   Continuous Infusions: . sodium chloride       LOS: 3 days    Gilbert Knipple Tanna Furry, MD [facility] Pager [phone]  If 7PM-7AM, please contact night-coverage [email or web address] Password [facility] 06/03/2017, 11:42 AM

## 2017-06-03 NOTE — Progress Notes (Signed)
Patient refusing to wear telemetry monitor. Educated patient on the importance of wearing telemetry monitor so we can monitor patients HR and rhythm. Will continue to monitor patient. Wardell Honour, RN

## 2017-06-03 NOTE — Plan of Care (Signed)
Problem: Cardiac: Goal: Ability to achieve and maintain adequate cardiopulmonary perfusion will improve Outcome: Progressing Pt HR stable throughout shift. Range 90-110. Occasional increase w/ activity. Remains on home 4L . Tolerated activity up to chair. Medications administered. Continue to monitor.

## 2017-06-03 NOTE — Progress Notes (Signed)
Inpatient Diabetes Program Recommendations  AACE/ADA: New Consensus Statement on Inpatient Glycemic Control (2015)  Target Ranges:  Prepandial:   less than 140 mg/dL      Peak postprandial:   less than 180 mg/dL (1-2 hours)      Critically ill patients:  140 - 180 mg/dL   Lab Results  Component Value Date   GLUCAP 121 (H) 06/03/2017   HGBA1C 6.9 (H) 04/28/2017    Review of Glycemic Control Results for MAICO, MULVEHILL (MRN [id number]) as of 06/03/2017 12:21  Ref. Range 06/02/2017 07:59 06/02/2017 11:12 06/02/2017 16:34 06/02/2017 21:28 06/03/2017 07:42  Glucose-Capillary Latest Ref Range: 65 - 99 mg/dL 183 (H) 225 (H) 283 (H) 224 (H) 121 (H)   Diabetes history: DM2 Outpatient Diabetes medications: Metformin 1 gm bid Current orders for Inpatient glycemic control: Novolog correction 0-9 units tid + 0-5 units hs  Inpatient Diabetes Program Recommendations:  Noted postprandial CBGs elevated.While in the hospital on steroids, please consider: -Novolog 3 units tid if eats 50%  Thank you, Nani Gasser. Meggan Dhaliwal, RN, MSN, CDE  Diabetes Coordinator Inpatient Glycemic Control Team Team Pager [phone] (8am-5pm) 06/03/2017 12:23 PM

## 2017-06-03 NOTE — Progress Notes (Signed)
Progress Note  Patient Name: Gilbert Dominguez Date of Encounter: 06/03/2017  Primary Cardiologist: Dr. Debara Pickett  Subjective   Reports breathing is at baseline. No chest pain or palpitations. HR has been variable in the 80's to low-100's.   Inpatient Medications    Scheduled Meds: . apixaban  5 mg Oral BID  . bumetanide  2 mg Oral BID  . dextromethorphan-guaiFENesin  1 tablet Oral BID  . diltiazem  60 mg Oral Q6H  . famotidine  20 mg Oral QHS  . feeding supplement (ENSURE ENLIVE)  237 mL Oral BID BM  . feeding supplement (PRO-STAT SUGAR FREE 64)  30 mL Oral BID  . insulin aspart  0-5 Units Subcutaneous QHS  . insulin aspart  0-9 Units Subcutaneous TID WC  . loratadine  10 mg Oral Daily  . pantoprazole  40 mg Oral Daily  . potassium chloride SA  40 mEq Oral Daily  . predniSONE  20 mg Oral Q breakfast  . sodium chloride flush  3 mL Intravenous Q12H  . terazosin  2 mg Oral QHS   Continuous Infusions: . sodium chloride     PRN Meds: sodium chloride, acetaminophen, ALPRAZolam, metoprolol tartrate, ondansetron (ZOFRAN) IV, sodium chloride flush   Vital Signs    Vitals:   06/03/17 0022 06/03/17 0433 06/03/17 0531 06/03/17 0635  BP: 108/76 104/81 106/78   Pulse:  (!) 122 (!) 108 92  Resp:  (!) 25 20   Temp:  98 F (36.7 C)    TempSrc:      SpO2:  90% 98%   Weight:  125 lb 6.4 oz (56.9 kg)    Height:        Intake/Output Summary (Last 24 hours) at 06/03/17 0743 Last data filed at 06/03/17 0436  Gross per 24 hour  Intake             1470 ml  Output             2250 ml  Net             -780 ml   Filed Weights   06/01/17 1100 06/02/17 0404 06/03/17 0433  Weight: 125 lb (56.7 kg) 126 lb (57.2 kg) 125 lb 6.4 oz (56.9 kg)    Telemetry    Atrial fibrillation, HR in 80's to 110's.  - Personally Reviewed  ECG    Atrial flutter, HR 102. - Personally Reviewed  Physical Exam   General: Well developed, elderly Caucasian male appearing in no acute distress. Head:  Normocephalic, atraumatic.  Neck: Supple without bruits, JVD not elevated. Lungs:  Resp regular and unlabored, CTA without wheezing or rales. Heart: Irregularly irregular, S1, S2, no S3, S4, or murmur; no rub. Abdomen: Soft, non-tender, non-distended with normoactive bowel sounds. No hepatomegaly. No rebound/guarding. No obvious abdominal masses. Extremities: No clubbing or cyanosis, 1+ pitting edema bilaterally, worse along left lower extremity. Distal pedal pulses are 2+ bilaterally. Neuro: Alert and oriented X 3. Moves all extremities spontaneously. Psych: Normal affect.  Labs    Chemistry Recent Labs Lab 06/01/17 0314 06/02/17 0355 06/03/17 0256  NA 138 133* 134*  K 3.1* 4.1 3.3*  CL 102 98* 96*  CO2 _0 GLUCOSE 126* 208* 135*  BUN 31* 32* 32*  CREATININE 0.85 0.98 0.88  CALCIUM 8.1* 8.8* 9.0  GFRNONAA >60 >60 >60  GFRAA >60 >60 >60  ANIONGAP _1 Hematology Recent Labs Lab 05/31/17 2058 06/01/17 0314 06/02/17 0355  WBC 10.3 10.8* 13.3*  RBC 4.16* 3.74* 3.96*  HGB 12.0* 10.8* 11.6*  HCT 37.3* 33.3* 35.8*  MCV 89.7 89.0 90.4  MCH 28.8 28.9 29.3  MCHC 32.2 32.4 32.4  RDW 17.5* 17.5* 17.5*  PLT 198 160 214    Cardiac Enzymes Recent Labs Lab 06/01/17 0314  TROPONINI 0.06*   No results for input(s): TROPIPOC in the last 168 hours.   BNPNo results for input(s): BNP, PROBNP in the last 168 hours.   DDimer No results for input(s): DDIMER in the last 168 hours.   Radiology    No results found.  Cardiac Studies   Echocardiogram: 05/01/2017 Study Conclusions  - Left ventricle: The cavity size was mildly reduced. Systolic   function was normal. The estimated ejection fraction was in the   range of 55% to 60%. Wall motion was normal; there were no   regional wall motion abnormalities. Doppler parameters are   consistent with abnormal left ventricular relaxation (grade 1   diastolic dysfunction). - Ventricular septum: The contour showed  diastolic flattening and   systolic flattening. These changes are consistent with RV volume   and pressure overload. - Aortic valve: There was very mild stenosis. Valve area (VTI):   1.85 cm^2. Valve area (Vmax): 1.61 cm^2. Valve area (Vmean): 1.5   cm^2. - Right ventricle: The cavity size was severely dilated. Systolic   function was moderately to severely reduced. - Right atrium: The atrium was severely dilated. - Tricuspid valve: There was moderate-severe regurgitation directed   eccentrically. - Pulmonary arteries: Systolic pressure was severely increased. PA   peak pressure: 87 mm Hg (S).  Patient Profile     71 y.o. male w/ PMH of paroxysmal atrial flutter (on Eliquis), chronic diastolic CHF, chronic respiratory failure (on 4L [place]), pulmonary fibrosis, pulmonary HTN, Stage 3 CKD, and Type 2 DM who presented for worsening dyspnea and palpitations, found to be in atrial flutter with RVR.  Assessment & Plan    1. Atrial Flutter with RVR - initially diagnosed with atrial flutter in 04/2017 at which time he converted to NSR while on IV Cardizem. Over the past 2 days, he has noticed worsening dyspnea and palpitations with HR being in the 130's - 140's on his home monitor.  - K+ and TSH are within normal limits. Mg low at 1.6 on 8/15 and replaced, at 2.0 this AM.  - he reports consuming 2-3 beers daily. Recommended cessation of this and to limit caffeine intake with his recurrent arrhythmias.  - started on IV Cardizem at the time of admission with this being switched to PO Cardizem 625m Q6H. EP was consulted and recommended a rate-control strategy as he is not a good ablation candidate. Will switch to long-acting Cardizem CD [id number].  - This patients CHA2DS2-VASc Score and unadjusted Ischemic Stroke Rate (% per year) is equal to 3.2 % stroke rate/year from a score of 3 (HTN, DM, Age). Continue Eliquis 25m22mID for anticoagulation.  2. Chronic Diastolic CHF - echo in 07/78/4696owed a  preserved EF of 55-60% with Grade 1 DD. No WMA.  - weight has been stable at 124 - 125 lbs on his home scales. CXR without acute findings but he still has 1+ pitting edema on examination which is chronic per the patient's report.  - continue with Bumex 2mg86mD.   3. Pulmonary HTN/ Right Heart Failure - echo showed severely increased PA pressures of 87 mm Hg.  - continue diuretic dosing as above. Recent  outpatient sleep study was negative for OSA.   4. Chronic respiratory failure/ Pulmonary Fibrosis - on 4L [place] at all times.   - followed by Dr. Melvyn Novas in the outpatient setting.   5. Stage 3 CKD - baseline creatinine 1.0-1.1.  - At 1.24 on admission and improved to 0.88 today.   6. Tobacco Use - he has resumed smoking 1 ppd.  - cessation advised.   Signed, Erma Heritage , PA-C 7:43 AM 06/03/2017 Pager: [phone]

## 2017-06-03 NOTE — Progress Notes (Signed)
Physical Therapy Treatment Patient Details Name: Gilbert Dominguez MRN: [id number] DOB: 05/05/1946 Today's Date: 06/03/2017    History of Present Illness Patient is a 71 y/o male who presents with DOE and palpitation. Found to be in A-flutter. Recently admitted 8/6-8/8 due to respiratory failure. PMH includes pulmonary fibrosis, pulmonary HTN, RA, CHF, DM.    PT Comments    Pt doing well with mobility and no further PT needed.  Ready for dc from PT standpoint.     Follow Up Recommendations  No PT follow up     Equipment Recommendations  Rolling walker with 5" wheels    Recommendations for Other Services       Precautions / Restrictions Precautions Precautions: None Precaution Comments: watch HR and 02 Restrictions Weight Bearing Restrictions: No    Mobility  Bed Mobility               General bed mobility comments: Up in chair upon PT arrival.   Transfers Overall transfer level: Modified independent Equipment used: None Transfers: Sit to/from Stand Sit to Stand: Modified independent (Device/Increase time)            Ambulation/Gait Ambulation/Gait assistance: Modified independent (Device/Increase time) Ambulation Distance (Feet): 175 Feet Assistive device: Rolling walker (2 wheeled) Gait Pattern/deviations: Step-through pattern;Decreased stride length;Trunk flexed Gait velocity: decreased Gait velocity interpretation: Below normal speed for age/gender General Gait Details: Steady gait with walker. Amb on 4L O2 with SpO2 87% and HR 134.   Stairs            Wheelchair Mobility    Modified Rankin (Stroke Patients Only)       Balance Overall balance assessment: Needs assistance Sitting-balance support: Feet supported;No upper extremity supported Sitting balance-Leahy Scale: Normal     Standing balance support: During functional activity;No upper extremity supported Standing balance-Leahy Scale: Fair                               Cognition Arousal/Alertness: Awake/alert Behavior During Therapy: WFL for tasks assessed/performed Overall Cognitive Status: Within Functional Limits for tasks assessed                                        Exercises      General Comments        Pertinent Vitals/Pain Pain Assessment: No/denies pain    Home Living                      Prior Function            PT Goals (current goals can now be found in the care plan section) Progress towards PT goals: Goals met/education completed, patient discharged from PT    Frequency           PT Plan Current plan remains appropriate    Co-evaluation              AM-PAC PT "6 Clicks" Daily Activity  Outcome Measure  Difficulty turning over in bed (including adjusting bedclothes, sheets and blankets)?: None Difficulty moving from lying on back to sitting on the side of the bed? : None Difficulty sitting down on and standing up from a chair with arms (e.g., wheelchair, bedside commode, etc,.)?: None Help needed moving to and from a bed to chair (including a wheelchair)?: None Help needed walking in hospital room?:   None Help needed climbing 3-5 steps with a railing? : A Little 6 Click Score: 23    End of Session Equipment Utilized During Treatment: Oxygen Activity Tolerance: Patient tolerated treatment well Patient left: in chair;with call bell/phone within reach Nurse Communication: Mobility status PT Visit Diagnosis: Other (comment);Muscle weakness (generalized) (M62.81) (DOE)     Time: 1249-1258 PT Time Calculation (min) (ACUTE ONLY): 9 min  Charges:  $Gait Training: 8-22 mins                    G Codes:       Cary Maycock PT [phone]    Cary W Maycok 06/03/2017, 2:49 PM   

## 2017-06-03 NOTE — Progress Notes (Signed)
PT Note  Pt now reporting that both of the walkers he has at home are his wife's and that he will need his own at DC. Recommend RW with 5" wheels for home.  [employer] PT [phone]

## 2017-06-03 NOTE — Discharge Summary (Addendum)
Physician Discharge Summary  Nichole Keltner LNL:[id number] DOB: 1945-10-23 DOA: 05/31/2017  PCP: Leonard Downing, MD  Admit date: 05/31/2017 Discharge date: 06/03/2017  Admitted From:home Disposition:home  Recommendations for Outpatient Follow-up:  1. Follow up with PCP in 1-2 weeks 2. Please obtain BMP/CBC in one week  Home Health:yes Equipment/Devices:walker Discharge Condition:stable CODE STATUS:full code Diet recommendation:heart healthy  Brief/Interim Summary: 71 y.o.malewith medical history significant for pulmonary fibrosis with chronic hypoxic respiratory failure, chronic diastolic CHF, atrial fibrillation on Eliquis,presented to the emergency department for evaluation of tachycardia. In the ER patient was found to have heart rate of140s with blood pressure 92/78. EKG with atrial flutter with RVR.  Assessment & Plan:   # atrial flutter with RVR: Off Diltiazem drip.  -Cardiologist changed to Cardizem to 240 mg.HR better controlled. Discussed with cardiology team today regarding discharge plan. On Eliquis for systemic anticoagulation. As per EP maintain rate control. -Likely has typical flutter as per EP note.  #Acute on chronic diastolic congestive heart failure.currently on Bumex.echo with EF of 55-60% with normal wall motion. Patient has chronic lower extremities edema. -Education provided on low-salt diet and weight monitoring at home.  #Pulmonary fibrosis with chronic hypoxic respiratory failure on home O2 at 4 L/immunosuppressed on chronic prednisone: Patient is stable. Continue supplemental oxygen, prednisone and nebulization as needed. Recommended to continue follow-up with pulmonologist.  #Hypokalemia, hypomagnesemia:repleted magnesium. Potassium chloride. Recommended to monitor labs with PCP or cardiologist in a week.  #Hypertension:monitor blood pressure. Continue Bumex, diltiazem  #Type 2 diabetes: Resume home medication. Follow up with  PCP.  #acute kidney injury: Improved.  Patient reported feeling better. Denied headache, dizziness, nausea vomiting chest pain or shortness of breath. He is very eager to go home today. Discussed with the cardiologist recommended that patient can go home today with outpatient follow-up. Home care service ordered including walker. At this time patient is medically stable to transfer his care to outpatient.  Discharge Diagnoses:  Principal Problem:   Atrial flutter with rapid ventricular response (HCC) Active Problems:   Postinflammatory pulmonary fibrosis (HCC)   Chronic respiratory failure with hypoxia (HCC)   Essential hypertension  CKD (chronic kidney disease) stage 3, GFR 30-59 ml/min   Acute on chronic diastolic CHF (congestive heart failure) (HCC)   Hypokalemia   Hypomagnesemia    Discharge Instructions  Discharge Instructions    (HEART FAILURE PATIENTS) Call MD:  Anytime you have any of the following symptoms: 1) 3 pound weight gain in 24 hours or 5 pounds in 1 week 2) shortness of breath, with or without a dry hacking cough 3) swelling in the hands, feet or stomach 4) if you have to sleep on extra pillows at night in order to breathe.    Complete by:  As directed    Call MD for:  difficulty breathing, headache or visual disturbances    Complete by:  As directed    Call MD for:  extreme fatigue    Complete by:  As directed    Call MD for:  hives    Complete by:  As directed    Call MD for:  persistant dizziness or light-headedness    Complete by:  As directed    Call MD for:  persistant nausea and vomiting    Complete by:  As directed    Call MD for:  severe uncontrolled pain    Complete by:  As directed    Call MD for:  temperature >100.4    Complete by:  As directed  Diet - low sodium heart healthy    Complete by:  As directed    Increase activity slowly    Complete by:  As directed    Walker rolling    Complete by:  As directed      Allergies as of 06/03/2017       Reactions   Lovastatin Rash      Medication List    TAKE these medications   acetaminophen 500 MG tablet Commonly known as:  TYLENOL Take 1,000 mg by mouth every 6 (six) hours as needed for mild pain.   apixaban 5 MG Tabs tablet Commonly known as:  ELIQUIS Take 1 tablet (5 mg total) by mouth 2 (two) times daily.   bumetanide 2 MG tablet Commonly known as:  BUMEX Take 1 tablet (2 mg total) by mouth 2 (two) times daily.   CALCIUM 600+D PLUS MINERALS PO Take 1 tablet by mouth 2 (two) times daily.   dextromethorphan-guaiFENesin 30-600 MG 12hr tablet Commonly known as:  MUCINEX DM Take 1 tablet by mouth 2 (two) times daily.   diltiazem 240 MG 24 hr capsule Commonly known as:  CARDIZEM CD Take 1 capsule (240 mg total) by mouth daily. What changed:  medication strength  how much to take   famotidine 20 MG tablet Commonly known as:  PEPCID Take 1 tablet (20 mg total) by mouth at bedtime.   feeding supplement (PRO-STAT SUGAR FREE 64) Liqd Take 30 mLs by mouth 2 (two) times daily.   loratadine 10 MG tablet Commonly known as:  CLARITIN Take 10 mg by mouth daily.   metFORMIN 500 MG 24 hr tablet Commonly known as:  GLUCOPHAGE XR Take 2 tabs with breakfast and 2 tabs with supper What changed:  how much to take  how to take this  when to take this  additional instructions   OXYGEN Inhale 4-8 L into the lungs See admin instructions. 4 liters CONTINUOUSLY and to titrate up to 8 liters CONTINUOUSLY (with exertion) "to keep sats above 90" ([employer])   pantoprazole 40 MG tablet Commonly known as:  PROTONIX TAKE 1 TABLET BY MOUTH EVERY DAY *TAKE 30-60 MINUTES BEFORE FIRST MEAL OF THE DAY* What changed:  See the new instructions.   potassium chloride SA 20 MEQ tablet Commonly known as:  K-DUR,KLOR-CON Take 1 tablet (20 mEq total) by mouth daily.   predniSONE 10 MG tablet Commonly known as:  DELTASONE Take 2 tablets (20 mg total) by mouth daily with breakfast.  Until FU with Pulmonary MD   terazosin 2 MG capsule Commonly known as:  HYTRIN Take 2 mg by mouth at bedtime.            Durable Medical Equipment        Start     Ordered   06/03/17 1535  For home use only DME Walker rolling  [facility])  Once    Question:  Patient needs a walker to treat with the following condition  Answer:  Weakness   06/03/17 1536     Follow-up Information    Pixie Casino, MD Follow up on 06/16/2017.   Specialty:  Cardiology Why:  Cardiology Follow-Up on 06/16/2017 at 9:00AM.  Contact information: [address] [place] [address] [place] Follow up.   Why:  Advertising account planner information: [address] [place] [address] [phone]        Leonard Downing, MD.  Schedule an appointment as soon as possible for a visit in 1 week(s).   Specialty:  Family Medicine Contact information: [place] [address] [phone]          Allergies  Allergen Reactions  . Lovastatin Rash    Consultations: Cardiology  Procedures/Studies: None  Subjective: Seen and examined at bedside. Denied headache, dizziness, nausea vomiting chest pain or shortness of breath.  Discharge Exam: Vitals:   06/03/17 0700 06/03/17 1100  BP:    Pulse:    Resp:    Temp: 97.9 F (36.6 C) 98.2 F (36.8 C)  SpO2:     Vitals:   06/03/17 0531 06/03/17 0635 06/03/17 0700 06/03/17 1100  BP: 106/78     Pulse: (!) 108 92    Resp: 20     Temp:   97.9 F (36.6 C) 98.2 F (36.8 C)  TempSrc:   Oral Oral  SpO2: 98%     Weight:      Height:        General: Pt is alert, awake, not in acute distress Cardiovascular: Irregular heart rate,, S1/S2 +, no rubs, no gallops Respiratory: CTA bilaterally, no wheezing, no rhonchi Abdominal: Soft, NT, ND, bowel sounds + Extremities: Bilateral lower extremities trace edema, no cyanosis    The results of significant  diagnostics from this hospitalization (including imaging, microbiology, ancillary and laboratory) are listed below for reference.     Microbiology: Recent Results (from the past 240 hour(s))  MRSA PCR Screening     Status: None   Collection Time: 06/01/17 11:36 AM  Result Value Ref Range Status   MRSA by PCR NEGATIVE NEGATIVE Final    Comment:        The GeneXpert MRSA Assay (FDA approved for NASAL specimens only), is one component of a comprehensive MRSA colonization surveillance program. It is not intended to diagnose MRSA infection nor to guide or monitor treatment for MRSA infections.      Labs: BNP (last 3 results)  Recent Labs  04/28/17 1725 05/09/17 0430 05/24/17 2334  BNP 2,262.7* 2,945.2* 4,580.9*   Basic Metabolic Panel:  Recent Labs Lab 05/31/17 2058 05/31/17 2121 06/01/17 0314 06/02/17 0355 06/03/17 0256  NA 134*  --  138 133* 134*  K 3.4*  --  3.1* 4.1 3.3*  CL 93*  --  102 98* 96*  CO2 28  --  _0 GLUCOSE 188*  --  126* 208* 135*  BUN 38*  --  31* 32* 32*  CREATININE 1.24  --  0.85 0.98 0.88  CALCIUM 9.0  --  8.1* 8.8* 9.0  MG  --  1.5*  --  1.6* 2.0   Liver Function Tests: No results for input(s): AST, ALT, ALKPHOS, BILITOT, PROT, ALBUMIN in the last 168 hours. No results for input(s): LIPASE, AMYLASE in the last 168 hours. No results for input(s): AMMONIA in the last 168 hours. CBC:  Recent Labs Lab 05/31/17 2058 06/01/17 0314 06/02/17 0355  WBC 10.3 10.8* 13.3*  HGB 12.0* 10.8* 11.6*  HCT 37.3* 33.3* 35.8*  MCV 89.7 89.0 90.4  PLT 198 160 214   Cardiac Enzymes:  Recent Labs Lab 06/01/17 0314  TROPONINI 0.06*   BNP: Invalid input(s): POCBNP CBG:  Recent Labs Lab 06/02/17 0759 06/02/17 1112 06/02/17 1634 06/02/17 2128 06/03/17 0742  GLUCAP 183* 225* 283* 224* 121*   D-Dimer No results for input(s): DDIMER in the last 72 hours. Hgb A1c No results for input(s): HGBA1C in  the last 72 hours. Lipid Profile No  results for input(s): CHOL, HDL, LDLCALC, TRIG, CHOLHDL, LDLDIRECT in the last 72 hours. Thyroid function studies  Recent Labs  06/01/17 0314  TSH 3.042   Anemia work up No results for input(s): VITAMINB12, FOLATE, FERRITIN, TIBC, IRON, RETICCTPCT in the last 72 hours. Urinalysis    Component Value Date/Time   COLORURINE YELLOW 05/25/2017 0157   APPEARANCEUR HAZY (A) 05/25/2017 0157   LABSPEC 1.011 05/25/2017 0157   PHURINE 5.0 05/25/2017 0157   GLUCOSEU NEGATIVE 05/25/2017 0157   HGBUR MODERATE (A) 05/25/2017 0157   BILIRUBINUR NEGATIVE 05/25/2017 0157   KETONESUR NEGATIVE 05/25/2017 0157   PROTEINUR 100 (A) 05/25/2017 0157   NITRITE NEGATIVE 05/25/2017 0157   LEUKOCYTESUR NEGATIVE 05/25/2017 0157   Sepsis Labs Invalid input(s): PROCALCITONIN,  WBC,  LACTICIDVEN Microbiology Recent Results (from the past 240 hour(s))  MRSA PCR Screening     Status: None   Collection Time: 06/01/17 11:36 AM  Result Value Ref Range Status   MRSA by PCR NEGATIVE NEGATIVE Final    Comment:        The GeneXpert MRSA Assay (FDA approved for NASAL specimens only), is one component of a comprehensive MRSA colonization surveillance program. It is not intended to diagnose MRSA infection nor to guide or monitor treatment for MRSA infections.      Time coordinating discharge: 33 minutes  SIGNED:   Rosita Fire, MD  [facility] 06/03/2017, 3:37 PM  If 7PM-7AM, please contact night-coverage [email or web address] Password [email or web address]

## 2017-06-03 NOTE — Care Management Note (Signed)
Case Management Note  Patient Details  Name: Gilbert Dominguez MRN: [id number] Date of Birth: Mar 12, 1946  Subjective/Objective: Pt presented for Atrial Flutter with RVR- pt is from home with support of wife. PTA Independent and not homebound. Has portable 02 via [employer]. Plan to return home 06-03-17.                    Action/Plan: PT recommendations for DME RW- CM did send amion text page for MD to place order. Pt wants to utilize [facility] for DME needs. Referral sent to [facility] for DME RW. No further needs from CM at this time.   Expected Discharge Date:                  Expected Discharge Plan:  Home/Self Care  In-House Referral:  NA  Discharge planning Services  CM Consult  Post Acute Care Choice:  Durable Medical Equipment Choice offered to:  Patient  DME Arranged:  Walker rolling DME Agency:  [place]:  NA [place] Agency:  NA  Status of Service:  Completed, signed off  If discussed at [place] of Stay Meetings, dates discussed:    Additional Comments:  Bethena Roys, RN 06/03/2017, 2:25 PM

## 2017-06-03 NOTE — Progress Notes (Signed)
Physical Therapy Discharge Patient Details Name: Archibald Marchetta MRN: [id number] DOB: [birth year] Today's Date: 06/03/2017 Time: 3151-7616 PT Time Calculation (min) (ACUTE ONLY): 9 min  Patient discharged from PT services secondary to goals met and no further PT needs identified.  Please see latest therapy progress note for current level of functioning and progress toward goals.    Progress and discharge plan discussed with patient and/or caregiver: Patient/Caregiver agrees with plan  [facility] PT [place] 06/03/2017, 2:51 PM

## 2017-06-07 ENCOUNTER — Telehealth: Payer: Self-pay | Admitting: Internal Medicine

## 2017-06-07 MED ORDER — DILTIAZEM HCL ER COATED BEADS 360 MG PO CP24: 360.0000 mg | ORAL_CAPSULE | Freq: Every day | ORAL | 6 refills | Status: AC

## 2017-06-07 NOTE — Telephone Encounter (Signed)
Spoke with pt, he reports elevated heart rates since discharge Thursday. He does not have palpitations or symptoms. He is currently taking diltiazem 240 mg daily. His bp that was checked while on the phone with me is 100/80. Discussed with dr hilty, will increase diltiazem to 360 mg. New script sent to the pharmacy. He will call if his bp gets too low and he starts having dizziness. Aware of follow up next week with dr Debara Pickett.

## 2017-06-07 NOTE — Telephone Encounter (Signed)
New Message  Pt c/o BP issue: STAT if pt c/o blurred vision, one-sided weakness or slurred speech  1. What are your last 5 BP readings? Per pt 138-145  2. Are you having any other symptoms (ex. Dizziness, headache, blurred vision, passed out)? No  3. What is your BP issue? Per pt would like to know if he should be worried...   Pt states he went to ER for bp and its still high. Please call back to discuss

## 2017-06-10 ENCOUNTER — Inpatient Hospital Stay (HOSPITAL_COMMUNITY)
Admission: EM | Admit: 2017-06-10 | Discharge: 2017-06-19 | DRG: 291 | Disposition: A | Discharge status: Home / Self Care | Payer: Medicare Other | Attending: Family Medicine | Admitting: Family Medicine

## 2017-06-10 ENCOUNTER — Encounter (HOSPITAL_COMMUNITY): Payer: Self-pay

## 2017-06-10 ENCOUNTER — Emergency Department (HOSPITAL_COMMUNITY): Payer: Medicare Other

## 2017-06-10 DIAGNOSIS — R64 Cachexia: Secondary | ICD-10-CM | POA: Diagnosis present

## 2017-06-10 DIAGNOSIS — J690 Pneumonitis due to inhalation of food and vomit: Secondary | ICD-10-CM | POA: Diagnosis present

## 2017-06-10 DIAGNOSIS — J9690 Respiratory failure, unspecified, unspecified whether with hypoxia or hypercapnia: Secondary | ICD-10-CM

## 2017-06-10 DIAGNOSIS — J849 Interstitial pulmonary disease, unspecified: Secondary | ICD-10-CM

## 2017-06-10 DIAGNOSIS — Z825 Family history of asthma and other chronic lower respiratory diseases: Secondary | ICD-10-CM | POA: Diagnosis not present

## 2017-06-10 DIAGNOSIS — E1122 Type 2 diabetes mellitus with diabetic chronic kidney disease: Secondary | ICD-10-CM | POA: Diagnosis present

## 2017-06-10 DIAGNOSIS — I471 Supraventricular tachycardia: Secondary | ICD-10-CM | POA: Diagnosis present

## 2017-06-10 DIAGNOSIS — Z79899 Other long term (current) drug therapy: Secondary | ICD-10-CM

## 2017-06-10 DIAGNOSIS — E876 Hypokalemia: Secondary | ICD-10-CM | POA: Diagnosis present

## 2017-06-10 DIAGNOSIS — Z800 Family history of malignant neoplasm of digestive organs: Secondary | ICD-10-CM | POA: Diagnosis not present

## 2017-06-10 DIAGNOSIS — I4891 Unspecified atrial fibrillation: Secondary | ICD-10-CM | POA: Diagnosis present

## 2017-06-10 DIAGNOSIS — I4892 Unspecified atrial flutter: Secondary | ICD-10-CM | POA: Diagnosis not present

## 2017-06-10 DIAGNOSIS — Z7984 Long term (current) use of oral hypoglycemic drugs: Secondary | ICD-10-CM

## 2017-06-10 DIAGNOSIS — I2723 Pulmonary hypertension due to lung diseases and hypoxia: Secondary | ICD-10-CM | POA: Diagnosis not present

## 2017-06-10 DIAGNOSIS — I5032 Chronic diastolic (congestive) heart failure: Secondary | ICD-10-CM | POA: Diagnosis present

## 2017-06-10 DIAGNOSIS — K761 Chronic passive congestion of liver: Secondary | ICD-10-CM | POA: Diagnosis present

## 2017-06-10 DIAGNOSIS — Z9981 Dependence on supplemental oxygen: Secondary | ICD-10-CM | POA: Diagnosis not present

## 2017-06-10 DIAGNOSIS — I483 Typical atrial flutter: Secondary | ICD-10-CM

## 2017-06-10 DIAGNOSIS — Z66 Do not resuscitate: Secondary | ICD-10-CM | POA: Diagnosis present

## 2017-06-10 DIAGNOSIS — I130 Hypertensive heart and chronic kidney disease with heart failure and stage 1 through stage 4 chronic kidney disease, or unspecified chronic kidney disease: Secondary | ICD-10-CM | POA: Diagnosis present

## 2017-06-10 DIAGNOSIS — D649 Anemia, unspecified: Secondary | ICD-10-CM | POA: Diagnosis present

## 2017-06-10 DIAGNOSIS — F17210 Nicotine dependence, cigarettes, uncomplicated: Secondary | ICD-10-CM | POA: Diagnosis present

## 2017-06-10 DIAGNOSIS — J9621 Acute and chronic respiratory failure with hypoxia: Secondary | ICD-10-CM | POA: Diagnosis present

## 2017-06-10 DIAGNOSIS — N183 Chronic kidney disease, stage 3 unspecified: Secondary | ICD-10-CM | POA: Diagnosis present

## 2017-06-10 DIAGNOSIS — I2721 Secondary pulmonary arterial hypertension: Secondary | ICD-10-CM | POA: Diagnosis present

## 2017-06-10 DIAGNOSIS — E871 Hypo-osmolality and hyponatremia: Secondary | ICD-10-CM | POA: Diagnosis present

## 2017-06-10 DIAGNOSIS — Y95 Nosocomial condition: Secondary | ICD-10-CM | POA: Diagnosis present

## 2017-06-10 DIAGNOSIS — J9620 Acute and chronic respiratory failure, unspecified whether with hypoxia or hypercapnia: Secondary | ICD-10-CM | POA: Diagnosis not present

## 2017-06-10 DIAGNOSIS — J189 Pneumonia, unspecified organism: Secondary | ICD-10-CM | POA: Diagnosis not present

## 2017-06-10 DIAGNOSIS — I4510 Unspecified right bundle-branch block: Secondary | ICD-10-CM | POA: Diagnosis present

## 2017-06-10 DIAGNOSIS — J439 Emphysema, unspecified: Secondary | ICD-10-CM | POA: Diagnosis present

## 2017-06-10 DIAGNOSIS — Z7902 Long term (current) use of antithrombotics/antiplatelets: Secondary | ICD-10-CM | POA: Diagnosis not present

## 2017-06-10 DIAGNOSIS — I10 Essential (primary) hypertension: Secondary | ICD-10-CM | POA: Diagnosis not present

## 2017-06-10 DIAGNOSIS — E869 Volume depletion, unspecified: Secondary | ICD-10-CM | POA: Diagnosis present

## 2017-06-10 DIAGNOSIS — Z8249 Family history of ischemic heart disease and other diseases of the circulatory system: Secondary | ICD-10-CM | POA: Diagnosis not present

## 2017-06-10 DIAGNOSIS — N179 Acute kidney failure, unspecified: Secondary | ICD-10-CM | POA: Diagnosis present

## 2017-06-10 DIAGNOSIS — I2781 Cor pulmonale (chronic): Secondary | ICD-10-CM | POA: Diagnosis present

## 2017-06-10 DIAGNOSIS — Z7901 Long term (current) use of anticoagulants: Secondary | ICD-10-CM | POA: Diagnosis not present

## 2017-06-10 LAB — CBC WITH DIFFERENTIAL/PLATELET
Basophils Absolute: 0 10*3/uL (ref 0.0–0.1)
Basophils Relative: 0 %
Eosinophils Absolute: 0 10*3/uL (ref 0.0–0.7)
Eosinophils Relative: 0 %
HCT: 39.6 % (ref 39.0–52.0)
HEMOGLOBIN: 13.2 g/dL (ref 13.0–17.0)
LYMPHS ABS: 0.7 10*3/uL (ref 0.7–4.0)
LYMPHS PCT: 6 %
MCH: 29 pg (ref 26.0–34.0)
MCHC: 33.3 g/dL (ref 30.0–36.0)
MCV: 87 fL (ref 78.0–100.0)
Monocytes Absolute: 0.1 10*3/uL (ref 0.1–1.0)
Monocytes Relative: 1 %
NEUTROS ABS: 11.2 10*3/uL — AB (ref 1.7–7.7)
NEUTROS PCT: 93 %
Platelets: 410 10*3/uL — ABNORMAL HIGH (ref 150–400)
RBC: 4.55 MIL/uL (ref 4.22–5.81)
RDW: 16.7 % — ABNORMAL HIGH (ref 11.5–15.5)
WBC: 12 10*3/uL — AB (ref 4.0–10.5)

## 2017-06-10 LAB — I-STAT TROPONIN, ED: TROPONIN I, POC: 0.05 ng/mL (ref 0.00–0.08)

## 2017-06-10 LAB — COMPREHENSIVE METABOLIC PANEL
ALK PHOS: 134 U/L — AB (ref 38–126)
ALT: 97 U/L — AB (ref 17–63)
AST: 66 U/L — ABNORMAL HIGH (ref 15–41)
Albumin: 3.2 g/dL — ABNORMAL LOW (ref 3.5–5.0)
Anion gap: 18 — ABNORMAL HIGH (ref 5–15)
BILIRUBIN TOTAL: 1.3 mg/dL — AB (ref 0.3–1.2)
BUN: 55 mg/dL — ABNORMAL HIGH (ref 6–20)
CALCIUM: 8.7 mg/dL — AB (ref 8.9–10.3)
CO2: 19 mmol/L — AB (ref 22–32)
CREATININE: 1.65 mg/dL — AB (ref 0.61–1.24)
Chloride: 81 mmol/L — ABNORMAL LOW (ref 101–111)
GFR, EST AFRICAN AMERICAN: 46 mL/min — AB (ref >60)
GFR, EST NON AFRICAN AMERICAN: 40 mL/min — AB (ref >60)
Glucose, Bld: 204 mg/dL — ABNORMAL HIGH (ref 65–99)
Potassium: 4 mmol/L (ref 3.5–5.1)
Sodium: 118 mmol/L — CL (ref 135–145)
TOTAL PROTEIN: 7.2 g/dL (ref 6.5–8.1)

## 2017-06-10 LAB — I-STAT ARTERIAL BLOOD GAS, ED
ACID-BASE EXCESS: 3 mmol/L — AB (ref 0.0–2.0)
BICARBONATE: 23.8 mmol/L (ref 20.0–28.0)
O2 Saturation: 97 %
PO2 ART: 80 mmHg — AB (ref 83.0–108.0)
Patient temperature: 98.6
TCO2: 25 mmol/L (ref 0–100)
pCO2 arterial: 27.2 mmHg — ABNORMAL LOW (ref 32.0–48.0)
pH, Arterial: 7.55 — ABNORMAL HIGH (ref 7.350–7.450)

## 2017-06-10 LAB — BRAIN NATRIURETIC PEPTIDE: B Natriuretic Peptide: 1928.5 pg/mL — ABNORMAL HIGH (ref 0.0–100.0)

## 2017-06-10 LAB — LIPASE, BLOOD: LIPASE: 24 U/L (ref 11–51)

## 2017-06-10 MED ORDER — METHYLPREDNISOLONE SODIUM SUCC 40 MG IJ SOLR
40.0000 mg | Freq: Three times a day (TID) | INTRAMUSCULAR | Status: DC
  Administered 2017-06-11 (×3): 40 mg via INTRAVENOUS
  Filled 2017-06-10 (×3): qty 1

## 2017-06-10 MED ORDER — DEXTROSE 5 % IV SOLN
2.0000 g | INTRAVENOUS | Status: DC
  Administered 2017-06-11 – 2017-06-13 (×3): 2 g via INTRAVENOUS
  Filled 2017-06-10 (×4): qty 2

## 2017-06-10 MED ORDER — VANCOMYCIN HCL 500 MG IV SOLR
500.0000 mg | Freq: Two times a day (BID) | INTRAVENOUS | Status: DC
  Administered 2017-06-11 – 2017-06-13 (×5): 500 mg via INTRAVENOUS
  Filled 2017-06-10 (×7): qty 500

## 2017-06-10 MED ORDER — APIXABAN 5 MG PO TABS
5.0000 mg | ORAL_TABLET | Freq: Two times a day (BID) | ORAL | Status: DC
  Administered 2017-06-11 – 2017-06-19 (×17): 5 mg via ORAL
  Filled 2017-06-10 (×17): qty 1

## 2017-06-10 MED ORDER — ACETAMINOPHEN 325 MG PO TABS: 650.0000 mg | ORAL_TABLET | Freq: Four times a day (QID) | ORAL | Status: DC | PRN

## 2017-06-10 MED ORDER — INSULIN ASPART 100 UNIT/ML ~~LOC~~ SOLN
0.0000 [IU] | Freq: Every day | SUBCUTANEOUS | Status: DC
  Administered 2017-06-11 – 2017-06-12 (×2): 3 [IU] via SUBCUTANEOUS
  Administered 2017-06-13 – 2017-06-14 (×2): 2 [IU] via SUBCUTANEOUS
  Administered 2017-06-15: 3 [IU] via SUBCUTANEOUS

## 2017-06-10 MED ORDER — VANCOMYCIN HCL IN DEXTROSE 1-5 GM/200ML-% IV SOLN
1000.0000 mg | Freq: Once | INTRAVENOUS | Status: AC
  Administered 2017-06-10: 1000 mg via INTRAVENOUS
  Filled 2017-06-10: qty 200

## 2017-06-10 MED ORDER — SODIUM CHLORIDE 0.9 % IV SOLN
INTRAVENOUS | Status: DC
  Administered 2017-06-11: 02:00:00 via INTRAVENOUS

## 2017-06-10 MED ORDER — LORATADINE 10 MG PO TABS
10.0000 mg | ORAL_TABLET | Freq: Every day | ORAL | Status: DC
  Administered 2017-06-11 – 2017-06-19 (×9): 10 mg via ORAL
  Filled 2017-06-10 (×9): qty 1

## 2017-06-10 MED ORDER — DILTIAZEM HCL ER COATED BEADS 180 MG PO CP24
360.0000 mg | ORAL_CAPSULE | Freq: Every day | ORAL | Status: DC
  Administered 2017-06-11 – 2017-06-12 (×2): 360 mg via ORAL
  Filled 2017-06-10: qty 1
  Filled 2017-06-10: qty 2

## 2017-06-10 MED ORDER — CALCIUM 600+D PLUS MINERALS 600-400 MG-UNIT PO TABS: 1.0000 | ORAL_TABLET | Freq: Two times a day (BID) | ORAL | Status: DC

## 2017-06-10 MED ORDER — ONDANSETRON HCL 4 MG/2ML IJ SOLN: 4.0000 mg | Freq: Four times a day (QID) | INTRAMUSCULAR | Status: DC | PRN

## 2017-06-10 MED ORDER — TERAZOSIN HCL 2 MG PO CAPS
2.0000 mg | ORAL_CAPSULE | Freq: Every day | ORAL | Status: DC
  Administered 2017-06-11 – 2017-06-18 (×8): 2 mg via ORAL
  Filled 2017-06-10 (×8): qty 1

## 2017-06-10 MED ORDER — DM-GUAIFENESIN ER 30-600 MG PO TB12
1.0000 | ORAL_TABLET | Freq: Two times a day (BID) | ORAL | Status: DC
  Administered 2017-06-11 – 2017-06-19 (×17): 1 via ORAL
  Filled 2017-06-10 (×16): qty 1

## 2017-06-10 MED ORDER — HYDROCODONE-ACETAMINOPHEN 5-325 MG PO TABS: 1.0000 | ORAL_TABLET | Freq: Four times a day (QID) | ORAL | Status: DC | PRN

## 2017-06-10 MED ORDER — FAMOTIDINE 20 MG PO TABS
20.0000 mg | ORAL_TABLET | Freq: Every day | ORAL | Status: DC
  Administered 2017-06-11 – 2017-06-18 (×8): 20 mg via ORAL
  Filled 2017-06-10 (×8): qty 1

## 2017-06-10 MED ORDER — SODIUM CHLORIDE 0.9 % IV BOLUS (SEPSIS)
500.0000 mL | Freq: Once | INTRAVENOUS | Status: AC
  Administered 2017-06-10: 500 mL via INTRAVENOUS

## 2017-06-10 MED ORDER — DEXTROSE 5 % IV SOLN: 1.0000 g | Freq: Three times a day (TID) | INTRAVENOUS | Status: DC

## 2017-06-10 MED ORDER — PANTOPRAZOLE SODIUM 40 MG PO TBEC
40.0000 mg | DELAYED_RELEASE_TABLET | Freq: Every day | ORAL | Status: DC
  Administered 2017-06-11 – 2017-06-19 (×9): 40 mg via ORAL
  Filled 2017-06-10 (×9): qty 1

## 2017-06-10 MED ORDER — ONDANSETRON HCL 4 MG/2ML IJ SOLN
4.0000 mg | Freq: Once | INTRAMUSCULAR | Status: AC
  Administered 2017-06-10: 4 mg via INTRAVENOUS
  Filled 2017-06-10: qty 2

## 2017-06-10 MED ORDER — DEXTROSE 5 % IV SOLN
1.0000 g | Freq: Once | INTRAVENOUS | Status: AC
  Administered 2017-06-10: 1 g via INTRAVENOUS
  Filled 2017-06-10: qty 1

## 2017-06-10 MED ORDER — INSULIN ASPART 100 UNIT/ML ~~LOC~~ SOLN
0.0000 [IU] | Freq: Three times a day (TID) | SUBCUTANEOUS | Status: DC
  Administered 2017-06-11: 3 [IU] via SUBCUTANEOUS
  Administered 2017-06-11: 2 [IU] via SUBCUTANEOUS
  Administered 2017-06-11: 1 [IU] via SUBCUTANEOUS
  Administered 2017-06-12 – 2017-06-13 (×4): 5 [IU] via SUBCUTANEOUS
  Administered 2017-06-13: 3 [IU] via SUBCUTANEOUS
  Administered 2017-06-13: 2 [IU] via SUBCUTANEOUS
  Administered 2017-06-14: 3 [IU] via SUBCUTANEOUS
  Administered 2017-06-14: 2 [IU] via SUBCUTANEOUS
  Administered 2017-06-15: 5 [IU] via SUBCUTANEOUS
  Administered 2017-06-15: 2 [IU] via SUBCUTANEOUS
  Administered 2017-06-15: 5 [IU] via SUBCUTANEOUS
  Administered 2017-06-16: 3 [IU] via SUBCUTANEOUS
  Administered 2017-06-16: 5 [IU] via SUBCUTANEOUS
  Administered 2017-06-17: 2 [IU] via SUBCUTANEOUS
  Administered 2017-06-17: 3 [IU] via SUBCUTANEOUS
  Administered 2017-06-17: 1 [IU] via SUBCUTANEOUS
  Administered 2017-06-18: 7 [IU] via SUBCUTANEOUS
  Administered 2017-06-19: 1 [IU] via SUBCUTANEOUS
  Filled 2017-06-10 (×2): qty 1

## 2017-06-10 MED ORDER — PRO-STAT SUGAR FREE PO LIQD
30.0000 mL | Freq: Two times a day (BID) | ORAL | Status: DC
  Administered 2017-06-11 – 2017-06-18 (×11): 30 mL via ORAL
  Filled 2017-06-10 (×15): qty 30

## 2017-06-10 NOTE — H&P (Signed)
History and Physical    Gilbert Dominguez HWE:[id number] DOB: [birth year] DOA: 06/10/2017  PCP: Leonard Downing, MD   Patient coming from: Home  Chief Complaint: SOB, nausea  HPI: Gilbert Dominguez is a 71 y.o. male with medical history significant for pulmonary fibrosis requiring 4 L/m supplemental oxygen around the clock, chronic diastolic CHF, atrial flutter on Eliquis, and hypertension, who presented to emergency department for evaluation of shortness of breath and acute nausea. Patient reports increased dyspnea over the past couple days, though there has not been much increase in his chronic cough. Denies fevers or chills. Cough is only occasionally productive. No associated chest pain and no increase in his chronic lower extremity edema. Reports that his weight has been stable. He developed acute nausea this evening, but has not vomited. He reports one loose stool this morning. Reports generalized abdominal discomfort.    ED Course: Upon arrival to the ED, patient is found to be afebrile, saturating in the 80s on his usual 4 L/m of supplemental oxygen, and with blood pressure 90/70. EKG features atrial flutter with incomplete right bundle branch block. Chest x-ray is notable for bibasilar opacities, worse than this study last month, and concerning for pneumonia superimposed on his chronic lung disease. Chemistry panel reveals a sodium of 118, down from 134 a week ago. Serum creatinine is elevated to 1.65, up from 0.88 a week earlier. There is slight elevation in transaminases and bilirubin, but these have improved from earlier this month. Troponin is within normal limits and BNP is elevated to 1929. CBC reveals a leukocytosis to 12,000 and thrombocytosis with platelets 410,000. Patient was treated with 500 mL normal saline, Zofran, and empiric vancomycin and cefepime in the emergency department. He continued to be dyspneic at rest, and requiring increasing levels of supplemental oxygen, and  will be admitted to the stepdown unit for ongoing evaluation and management of acute on chronic hypoxic respiratory failure suspected secondary to HCAP, as well as hyponatremia with sodium of 118.   Review of Systems:  All other systems reviewed and apart from HPI, are negative.  Past Medical History:  Diagnosis Date  . Atrial flutter ([place])    a. initially diagnosed in 04/2017, started on Eliquis b. recurrent in 05/2017  . CHF exacerbation ([place]) 02/24/2017  . Chronic respiratory failure ([place])   . Hypertension   . 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 Rheumatoid arthritis(714.0)     Past Surgical History:  Procedure Laterality Date  . ACNE CYST REMOVAL  1968  . VASECTOMY  1973     reports that he quit smoking about 21 years ago. His smoking use included Cigarettes. He has a 55.50 pack-year smoking history. He has never used smokeless tobacco. He reports that he drinks about 1.8 oz of alcohol per week . He reports that he does not use drugs.  Allergies  Allergen Reactions  . Lovastatin Rash    Family History  Problem Relation Age of Onset  . Heart disease Father   . Emphysema Father   . Liver cancer Mother      Prior to Admission medications   Medication Sig Start Date End Date Taking? Authorizing Provider  acetaminophen (TYLENOL) 500 MG tablet Take 1,000 mg by mouth every 6 (six) hours as needed for mild pain.   Yes [provider]  Amino Acids-Protein Hydrolys (FEEDING SUPPLEMENT, PRO-STAT SUGAR FREE 64,) LIQD Take 30 mLs by mouth 2 (two) times daily. 05/06/17  Yes Johnson, Clanford L, MD  apixaban (ELIQUIS) 5  MG TABS tablet Take 1 tablet (5 mg total) by mouth 2 (two) times daily. 05/06/17  Yes Johnson, Clanford L, MD  bumetanide (BUMEX) 2 MG tablet Take 1 tablet (2 mg total) by mouth 2 (two) times daily. 05/06/17  Yes Johnson, Clanford L, MD  Calcium Carbonate-Vit D-Min (CALCIUM 600+D PLUS MINERALS PO) Take 1 tablet by mouth 2 (two) times daily.   Yes  [provider]  diltiazem (CARDIZEM CD) 360 MG 24 hr capsule Take 1 capsule (360 mg total) by mouth daily. 06/07/17  Yes Hilty, Nadean Corwin, MD  famotidine (PEPCID) 20 MG tablet Take 1 tablet (20 mg total) by mouth at bedtime. 01/27/17  Yes Tanda Rockers, MD  loratadine (CLARITIN) 10 MG tablet Take 10 mg by mouth daily.   Yes [provider]  metFORMIN (GLUCOPHAGE XR) 500 MG 24 hr tablet Take 2 tabs with breakfast and 2 tabs with supper Patient taking differently: Take 1,000 mg by mouth 2 (two) times daily.  05/06/17  Yes Johnson, Clanford L, MD  OXYGEN Inhale 4-8 L into the lungs See admin instructions. 4 liters CONTINUOUSLY and to titrate up to 8 liters CONTINUOUSLY (with exertion) "to keep sats above 90" ([employer])   Yes [provider]  pantoprazole (PROTONIX) 40 MG tablet TAKE 1 TABLET BY MOUTH EVERY DAY *TAKE 30-60 MINUTES BEFORE FIRST MEAL OF THE DAY* Patient taking differently: TAKE 40MG BY MOUTH EVERY DAY *TAKE 30-60 MINUTES BEFORE FIRST MEAL OF THE DAY* 06/29/16  Yes Tanda Rockers, MD  potassium chloride SA (K-DUR,KLOR-CON) 20 MEQ tablet Take 1 tablet (20 mEq total) by mouth daily. 12/12/16  Yes Bonnielee Haff, MD  predniSONE (DELTASONE) 10 MG tablet Take 2 tablets (20 mg total) by mouth daily with breakfast. Until FU with Pulmonary MD 03/04/17  Yes Domenic Polite, MD  terazosin (HYTRIN) 2 MG capsule Take 2 mg by mouth at bedtime.   Yes [provider]  dextromethorphan-guaiFENesin (MUCINEX DM) 30-600 MG 12hr tablet Take 1 tablet by mouth 2 (two) times daily.     [provider]    Physical Exam: Vitals:   06/10/17 2145 06/10/17 2200 06/10/17 2201 06/10/17 2215  BP: 91/70 91/68  93/72  Pulse: (!) 57 (!) 58 60 70  Resp: _0 (!) 22  Temp:      TempSrc:      SpO2: 99%  93%   Weight:      Height:          Constitutional: Cachectic, chronically-ill appearing, no pallor, no diaphoresis.  Eyes: PERTLA, lids and conjunctivae  normal ENMT: Mucous membranes are moist. Posterior pharynx clear of any exudate or lesions.   Neck: normal, supple, no masses, no thyromegaly Respiratory: Fine rales in bilateral bases, rhonchi at lower right zone. Dyspneic with speech, tachypneic, increase WOB. No pallor or cyanosis.  Cardiovascular: Rate ~60 and irregular. Bilateral LE edema to thighs. JVP 9 cmH2O. Abdomen: No distension, no tenderness, no masses palpated. Bowel sounds normal.  Musculoskeletal: no clubbing / cyanosis. No joint deformity upper and lower extremities.   Skin: no significant rashes, lesions, ulcers. Poor turgor. Neurologic: CN 2-12 grossly intact. Sensation intact, DTR normal. Strength 5/5 in all 4 limbs.  Psychiatric: Alert and oriented x 3. Pleasant and cooperative.     Labs on Admission: I have personally reviewed following labs and imaging studies  CBC:  Recent Labs Lab 06/10/17 2016  WBC 12.0*  NEUTROABS 11.2*  HGB 13.2  HCT 39.6  MCV 87.0  PLT 410*  Basic Metabolic Panel:  Recent Labs Lab 06/10/17 2016  NA 118*  K 4.0  CL 81*  CO2 19*  GLUCOSE 204*  BUN 55*  CREATININE 1.65*  CALCIUM 8.7*   GFR: Estimated Creatinine Clearance: 32.5 mL/min (A) (by C-G formula based on SCr of 1.65 mg/dL (H)). Liver Function Tests:  Recent Labs Lab 06/10/17 2016  AST 66*  ALT 97*  ALKPHOS 134*  BILITOT 1.3*  PROT 7.2  ALBUMIN 3.2*    Recent Labs Lab 06/10/17 2016  LIPASE 24   No results for input(s): AMMONIA in the last 168 hours. Coagulation Profile: No results for input(s): INR, PROTIME in the last 168 hours. Cardiac Enzymes: No results for input(s): CKTOTAL, CKMB, CKMBINDEX, TROPONINI in the last 168 hours. BNP (last 3 results)  Recent Labs  01/20/17 1642  PROBNP 1,867.0*   HbA1C: No results for input(s): HGBA1C in the last 72 hours. CBG: No results for input(s): GLUCAP in the last 168 hours. Lipid Profile: No results for input(s): CHOL, HDL, LDLCALC, TRIG, CHOLHDL,  LDLDIRECT in the last 72 hours. Thyroid Function Tests: No results for input(s): TSH, T4TOTAL, FREET4, T3FREE, THYROIDAB in the last 72 hours. Anemia Panel: No results for input(s): VITAMINB12, FOLATE, FERRITIN, TIBC, IRON, RETICCTPCT in the last 72 hours. Urine analysis:    Component Value Date/Time   COLORURINE YELLOW 05/25/2017 0157   APPEARANCEUR HAZY (A) 05/25/2017 0157   LABSPEC 1.011 05/25/2017 0157   PHURINE 5.0 05/25/2017 0157   GLUCOSEU NEGATIVE 05/25/2017 0157   HGBUR MODERATE (A) 05/25/2017 0157   BILIRUBINUR NEGATIVE 05/25/2017 0157   KETONESUR NEGATIVE 05/25/2017 0157   PROTEINUR 100 (A) 05/25/2017 0157   NITRITE NEGATIVE 05/25/2017 0157   LEUKOCYTESUR NEGATIVE 05/25/2017 0157   Sepsis Labs: _0 (procalcitonin:4,lacticidven:4) ) Recent Results (from the past 240 hour(s))  MRSA PCR Screening     Status: None   Collection Time: 06/01/17 11:36 AM  Result Value Ref Range Status   MRSA by PCR NEGATIVE NEGATIVE Final    Comment:        The GeneXpert MRSA Assay (FDA approved for NASAL specimens only), is one component of a comprehensive MRSA colonization surveillance program. It is not intended to diagnose MRSA infection nor to guide or monitor treatment for MRSA infections.      Radiological Exams on Admission: Dg Abdomen Acute W/chest  Result Date: 06/10/2017 CLINICAL DATA:  Shortness of breath. EXAM: DG ABDOMEN ACUTE W/ 1V CHEST COMPARISON:  May 31, 2017 FINDINGS: Bibasilar opacities are identified. These are not significantly changed since May 31, 2017 but they have increased since April 28, 2017. IMPRESSION: Bibasilar opacities, worsened since April 28, 2017 are thought to represent an acute on chronic process such as infiltrate overlying basilar scarring or fibrosis. No other changes. Electronically Signed   By: Dorise Bullion III M.D   On: 06/10/2017 20:19    EKG: Independently reviewed. Atrial flutter, incomplete RBBB.   Assessment/Plan  1.  HCAP, acute on chronic hypoxic respiratory failure   - Pt presents with increased SOB and fatigue, noted to be in respiratory distress with increased O2-requirement, leukocytosis, and rhonchi  - He was treated with empiric vancomycin and cefepime in ED  - Plan to obtain blood and sputum cultures, check strep pneumo and legionella urine antigens, continue supplemental O2, continue abx - Has been on 20 mg prednisone daily for chronic ILD, will increase to 40 mg IV Solu-Medrol q8h    2. Hyponatremia  - Serum sodium is 118 on admission, having been  134 on 06/03/17  - There is peripheral edema and JVD on exam, wt is stable, likely intravascularly depleted  - Given 500 cc NS in ED  - Could be secondary to PNA, workup and management as above  - Check osmolality, follow serial chem panels   3. Acute kidney injury  - SCr is 1.65 on admission, up from 0.88 on 06/03/17  - There is peripheral edema and JVD, with stable weight - Suspect he is intravascularly depleted; he was treated with 500 cc NS and started on 50 cc/hr NS pending serial chem panels as above  - Check renal US for obstructive etio  4. Chronic atrial flutter  - In rate-controlled atrial flutter on admission  - CHADS-VASc is 47 (age, CHF, HTN, DM) - Continue Eliquis and diltiazem    5. Chronic diastolic CHF  - Wt is stable, stable chronic peripheral edema  - Markedly hyponatremic as above, with fluid status difficult to determine, will check osmolality to help with this  - Hold diuretics, continue very gentle NS infusion pending repeat chem panels  - Follow strict I/O's and daily wts   6. Type II DM  - A1c was 6.9% in July 2018  - Managed at home with metformin only  - Check CBG's with meals and qHS - Start a low-intensity SSI with Novolog   DVT prophylaxis: Eliquis  Code Status: Full  Family Communication: Discussed with patient Disposition Plan: Admit to SDU Consults called: None Admission status: Inpatient    Vianne Bulls, MD [facility] Pager [phone]  If 7PM-7AM, please contact night-coverage [email or web address] Password [facility]  06/10/2017, 10:39 PM

## 2017-06-10 NOTE — ED Notes (Signed)
Respiratory at bedside

## 2017-06-10 NOTE — ED Notes (Signed)
Pt to radiology.

## 2017-06-10 NOTE — ED Notes (Signed)
Dr.Knapp at bedside  

## 2017-06-10 NOTE — ED Triage Notes (Signed)
Pt arrived via [facility] c/o fatigue, generalized weakness and nausea from home started today.  Pt states he took a motion sickness pill earlier and it made him tired. Pt states he is home O2 is 4L, pt had home O2 on 10L [place] when EMS arrived.

## 2017-06-10 NOTE — Progress Notes (Signed)
PHARMACY NOTE:  ANTIMICROBIAL RENAL DOSAGE ADJUSTMENT  Current antimicrobial regimen includes a mismatch between antimicrobial dosage and estimated renal function.  As per policy approved by the Pharmacy & Therapeutics and Medical Executive Committees, the antimicrobial dosage will be adjusted accordingly.  Current antimicrobial dosage:  Cefepime 1g IV q8hr   Indication: pneumonia   Renal Function:  Estimated Creatinine Clearance: 32.5 mL/min (A) (by C-G formula based on SCr of 1.65 mg/dL (H)). _0      On intermittent HD, scheduled: _1      On CRRT    Antimicrobial dosage has been changed to:  Cefepime 2g IV q24hr   Argie Ramming, PharmD Clinical Pharmacist 06/10/17 10:49 PM

## 2017-06-10 NOTE — Progress Notes (Signed)
Pharmacy Antibiotic Note  Gilbert Dominguez is a 71 y.o. male admitted on 06/10/2017 with pneumonia.  Pharmacy has been consulted for Vancomycin dosing. WBC elevated at 12. Patient with AKI and SCr up to 1.65 (BL ~ 0.88). nCrCl ~ 40 mL/min. Patient has one dose of Cefepime ordered in the ED.   Plan: -Vancomycin 1 gm IV once then vanc 500 mg IV Q 12 hours -Monitor CBC, renal fx, cultures and clinical progress -VT at SS  Height: _0  (160 cm) Weight: 125 lb (56.7 kg) IBW/kg (Calculated) : 56.9  Temp (24hrs), Avg:98.6 F (37 C), Min:98.6 F (37 C), Max:98.6 F (37 C)   Recent Labs Lab 06/10/17 2016  WBC 12.0*  CREATININE 1.65*    Estimated Creatinine Clearance: 32.5 mL/min (A) (by C-G formula based on SCr of 1.65 mg/dL (H)).    Allergies  Allergen Reactions  . Lovastatin Rash    Antimicrobials this admission: 8/23 Vanc >>  8/23 Cefepime >>   Dose adjustments this admission: None  Microbiology results: 8/23 MRSA PCR: neg  Thank you for allowing pharmacy to be a part of this patient's care.  Albertina Parr, PharmD., BCPS Clinical Pharmacist Pager [phone]

## 2017-06-10 NOTE — ED Provider Notes (Signed)
[facility] DEPT Provider Note   CSN: [id number] Arrival date & time: 06/10/17  1925     History   Chief Complaint Chief Complaint  Patient presents with  . Fatigue  . Nausea    HPI Gilbert Dominguez is a 71 y.o. male.  HPI Patient presents to the emergency room for evaluation of palpitations and nausea. Patient states he was getting ready to have dinner this evening when he became very nauseated.  He also started to feel a little short of breath. Patient feels as if his abdomen is somewhat bloated but it is not painful. He eventually was able to drink and nutritional shake.  Symptoms are improving slightly.  He denies any chest pain. No increasing leg swelling. No fevers or chills. No cough. Past Medical History:  Diagnosis Date  . Atrial flutter ([place])    a. initially diagnosed in 04/2017, started on Eliquis b. recurrent in 05/2017  . CHF exacerbation ([place]) 02/24/2017  . Chronic respiratory failure ([place])   . Hypertension   . 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 Rheumatoid arthritis(714.0)     Patient Active Problem List   Diagnosis Date Noted  . Hypomagnesemia   . High anion gap metabolic acidosis 29/47/6546  . Transaminitis 05/25/2017  . Positive D-dimer   . Atrial flutter with rapid ventricular response ([place])   . Pulmonary HTN ([place])   . Hypokalemia 04/28/2017  . Hyperglycemia 04/28/2017  . Atrial tachycardia ([place])   . Tachycardia 04/08/2017  . Acute on chronic diastolic CHF (congestive heart failure) ([place])   . Pulmonary hypertension ([place])   . 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 CKD (chronic kidney disease) stage 3, GFR 30-59 ml/min 02/25/2017  . Malnutrition of moderate degree 02/25/2017  . CHF exacerbation ([place]) 02/24/2017  . 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 Fluid overload 12/08/2016  . Elevated troponin 12/08/2016  . Hyperlipidemia 12/08/2016  . Anemia 12/08/2016  . Essential  hypertension 11/19/2015  . Cough 09/05/2014  . Postinflammatory pulmonary fibrosis ([place]) 11/23/2013  . Dyspnea 11/23/2013  . Chronic respiratory failure with hypoxia ([place]) 11/23/2013    Past Surgical History:  Procedure Laterality Date  . ACNE CYST REMOVAL  1968  . [place] Medications    Prior to Admission medications   Medication Sig Start Date End Date Taking? Authorizing Provider  acetaminophen (TYLENOL) 500 MG tablet Take 1,000 mg by mouth every 6 (six) hours as needed for mild pain.   Yes [provider]  Amino Acids-Protein Hydrolys (FEEDING SUPPLEMENT, PRO-STAT SUGAR FREE 64,) LIQD Take 30 mLs by mouth 2 (two) times daily. 05/06/17  Yes Johnson, Clanford L, MD  apixaban (ELIQUIS) 5 MG TABS tablet Take 1 tablet (5 mg total) by mouth 2 (two) times daily. 05/06/17  Yes Johnson, Clanford L, MD  bumetanide (BUMEX) 2 MG tablet Take 1 tablet (2 mg total) by mouth 2 (two) times daily. 05/06/17  Yes Johnson, Clanford L, MD  Calcium Carbonate-Vit D-Min (CALCIUM 600+D PLUS MINERALS PO) Take 1 tablet by mouth 2 (two) times daily.   Yes [provider]  diltiazem (CARDIZEM CD) 360 MG 24 hr capsule Take 1 capsule (360 mg total) by mouth daily. 06/07/17  Yes Hilty, Nadean Corwin, MD  famotidine (PEPCID) 20 MG tablet Take 1 tablet (20 mg total) by mouth at bedtime. 01/27/17  Yes Tanda Rockers, MD  loratadine (CLARITIN) 10 MG tablet Take 10 mg  by mouth daily.   Yes [provider]  metFORMIN (GLUCOPHAGE XR) 500 MG 24 hr tablet Take 2 tabs with breakfast and 2 tabs with supper Patient taking differently: Take 1,000 mg by mouth 2 (two) times daily.  05/06/17  Yes Johnson, Clanford L, MD  OXYGEN Inhale 4-8 L into the lungs See admin instructions. 4 liters CONTINUOUSLY and to titrate up to 8 liters CONTINUOUSLY (with exertion) "to keep sats above 90" ([employer])   Yes [provider]  pantoprazole (PROTONIX) 40 MG tablet TAKE 1 TABLET BY MOUTH EVERY DAY  *TAKE 30-60 MINUTES BEFORE FIRST MEAL OF THE DAY* Patient taking differently: TAKE 40MG BY MOUTH EVERY DAY *TAKE 30-60 MINUTES BEFORE FIRST MEAL OF THE DAY* 06/29/16  Yes Tanda Rockers, MD  potassium chloride SA (K-DUR,KLOR-CON) 20 MEQ tablet Take 1 tablet (20 mEq total) by mouth daily. 12/12/16  Yes Bonnielee Haff, MD  predniSONE (DELTASONE) 10 MG tablet Take 2 tablets (20 mg total) by mouth daily with breakfast. Until FU with Pulmonary MD 03/04/17  Yes Domenic Polite, MD  terazosin (HYTRIN) 2 MG capsule Take 2 mg by mouth at bedtime.   Yes [provider]  dextromethorphan-guaiFENesin (MUCINEX DM) 30-600 MG 12hr tablet Take 1 tablet by mouth 2 (two) times daily.     [provider]    Family History Family History  Problem Relation Age of Onset  . Heart disease Father   . Emphysema Father   . Liver cancer Mother     Social History Social History  Substance Use Topics  . Smoking status: Former Smoker    Packs/day: 1.50    Years: 37.00    Types: Cigarettes    Quit date: 10/14/1995  . Smokeless tobacco: Never Used  . Alcohol use 1.8 oz/week    3 Cans of beer per week     Comment: occasionally     Allergies   Lovastatin   Review of Systems Review of Systems  All other systems reviewed and are negative.    Physical Exam Updated Vital Signs BP 91/70   Pulse (!) 57   Temp 98.6 F (37 C) (Oral)   Resp 16   Ht 1.6 m (_0 )   Wt 56.7 kg (125 lb)   SpO2 99%   BMI 22.14 kg/m   Physical Exam  Constitutional: No distress.  Elderly, frail, thin  HENT:  Head: Normocephalic and atraumatic.  Right Ear: External ear normal.  Left Ear: External ear normal.  Eyes: Conjunctivae are normal. Right eye exhibits no discharge. Left eye exhibits no discharge. No scleral icterus.  Neck: Neck supple. No tracheal deviation present.  Cardiovascular: Normal rate, regular rhythm and intact distal pulses.   Pulmonary/Chest: Effort normal. No stridor. No respiratory  distress. He has no wheezes. He has rales (bilateral).  Abdominal: Soft. Bowel sounds are normal. He exhibits no distension. There is no tenderness. There is no rebound and no guarding.  Mildly distended  Musculoskeletal: He exhibits edema (pitting edema bilateral lower extremities). He exhibits no tenderness.  Neurological: He is alert. He has normal strength. No cranial nerve deficit (no facial droop, extraocular movements intact, no slurred speech) or sensory deficit. He exhibits normal muscle tone. He displays no seizure activity. Coordination normal.  Skin: Skin is warm and dry. No rash noted.  Psychiatric: He has a normal mood and affect.  Nursing note and vitals reviewed.    ED Treatments / Results  Labs (all labs ordered are listed, but only abnormal results  are displayed) Labs Reviewed  CBC WITH DIFFERENTIAL/PLATELET - Abnormal; Notable for the following:       Result Value   WBC 12.0 (*)    RDW 16.7 (*)    Platelets 410 (*)    Neutro Abs 11.2 (*)    All other components within normal limits  COMPREHENSIVE METABOLIC PANEL - Abnormal; Notable for the following:    Sodium 118 (*)    Chloride 81 (*)    CO2 19 (*)    Glucose, Bld 204 (*)    BUN 55 (*)    Creatinine, Ser 1.65 (*)    Calcium 8.7 (*)    Albumin 3.2 (*)    AST 66 (*)    ALT 97 (*)    Alkaline Phosphatase 134 (*)    Total Bilirubin 1.3 (*)    GFR calc non Af Amer 40 (*)    GFR calc Af Amer 46 (*)    Anion gap 18 (*)    All other components within normal limits  BRAIN NATRIURETIC PEPTIDE - Abnormal; Notable for the following:    B Natriuretic Peptide 1,928.5 (*)    All other components within normal limits  LIPASE, BLOOD  I-STAT TROPONIN, ED    EKG  EKG Interpretation  Date/Time:  Thursday June 10 2017 19:36:26 EDT Ventricular Rate:  70 PR Interval:    QRS Duration: 113 QT Interval:  372 QTC Calculation: 402 R Axis:   142 Text Interpretation:  Atrial flutter IRBBB and LPFB ST elevation,  consider lateral injury No significant change since last tracing 3 minutes earlier Confirmed by Dorie Rank [id number]) on 06/10/2017 7:59:42 PM       Radiology Dg Abdomen Acute W/chest  Result Date: 06/10/2017 CLINICAL DATA:  Shortness of breath. EXAM: DG ABDOMEN ACUTE W/ 1V CHEST COMPARISON:  May 31, 2017 FINDINGS: Bibasilar opacities are identified. These are not significantly changed since May 31, 2017 but they have increased since April 28, 2017. IMPRESSION: Bibasilar opacities, worsened since April 28, 2017 are thought to represent an acute on chronic process such as infiltrate overlying basilar scarring or fibrosis. No other changes. Electronically Signed   By: Dorise Bullion III M.D   On: 06/10/2017 20:19    Procedures Procedures (including critical care time)  Medications Ordered in ED Medications  ceFEPIme (MAXIPIME) 1 g in dextrose 5 % 50 mL IVPB (not administered)  ondansetron (ZOFRAN) injection 4 mg (4 mg Intravenous Given 06/10/17 1942)     Initial Impression / Assessment and Plan / ED Course  I have reviewed the triage vital signs and the nursing notes.  Pertinent labs & imaging results that were available during my care of the patient were reviewed by me and considered in my medical decision making (see chart for details).   patient presented to the emergency room with complaints of nausea weakness and shortness of breath. Patient's laboratory tests are notable for hyponatremia and acute kidney injury. Patient's chest x-ray also suggest the possibility of underlying pneumonia. BNP is elevated but this seems to be chronic. I doubt congestive heart failure. I will consult the medical service for admission and further treatment  Final Clinical Impressions(s) / ED Diagnoses   Final diagnoses:  Acute kidney injury ([place])  Hyponatremia  HCAP (healthcare-associated pneumonia)      Dorie Rank, MD 06/10/17 2211

## 2017-06-10 NOTE — ED Notes (Signed)
Spoke with Respiratory for [facility] here shortly

## 2017-06-11 ENCOUNTER — Inpatient Hospital Stay (HOSPITAL_COMMUNITY): Payer: Medicare Other

## 2017-06-11 LAB — BASIC METABOLIC PANEL
ANION GAP: 11 (ref 5–15)
Anion gap: 13 (ref 5–15)
Anion gap: 12 (ref 5–15)
BUN: 53 mg/dL — ABNORMAL HIGH (ref 6–20)
BUN: 49 mg/dL — AB (ref 6–20)
BUN: 49 mg/dL — ABNORMAL HIGH (ref 6–20)
CALCIUM: 8.9 mg/dL (ref 8.9–10.3)
CALCIUM: 8.9 mg/dL (ref 8.9–10.3)
CHLORIDE: 91 mmol/L — AB (ref 101–111)
CO2: 26 mmol/L (ref 22–32)
CO2: 27 mmol/L (ref 22–32)
CO2: 28 mmol/L (ref 22–32)
CREATININE: 1.36 mg/dL — AB (ref 0.61–1.24)
CREATININE: 1.33 mg/dL — AB (ref 0.61–1.24)
Calcium: 8.9 mg/dL (ref 8.9–10.3)
Chloride: 88 mmol/L — ABNORMAL LOW (ref 101–111)
Chloride: 90 mmol/L — ABNORMAL LOW (ref 101–111)
Creatinine, Ser: 1.23 mg/dL (ref 0.61–1.24)
GFR calc Af Amer: >60 mL/min (ref >60)
GFR calc non Af Amer: 50 mL/min — ABNORMAL LOW (ref >60)
GFR calc non Af Amer: 52 mL/min — ABNORMAL LOW (ref >60)
GFR calc non Af Amer: 57 mL/min — ABNORMAL LOW (ref >60)
GFR, EST AFRICAN AMERICAN: 58 mL/min — AB (ref >60)
GLUCOSE: 180 mg/dL — AB (ref 65–99)
Glucose, Bld: 150 mg/dL — ABNORMAL HIGH (ref 65–99)
Glucose, Bld: 131 mg/dL — ABNORMAL HIGH (ref 65–99)
POTASSIUM: 3.6 mmol/L (ref 3.5–5.1)
Potassium: 3.8 mmol/L (ref 3.5–5.1)
Potassium: 3.9 mmol/L (ref 3.5–5.1)
SODIUM: 127 mmol/L — AB (ref 135–145)
Sodium: 129 mmol/L — ABNORMAL LOW (ref 135–145)
Sodium: 130 mmol/L — ABNORMAL LOW (ref 135–145)

## 2017-06-11 LAB — CBC WITH DIFFERENTIAL/PLATELET
BASOS PCT: 0 %
Basophils Absolute: 0 10*3/uL (ref 0.0–0.1)
EOS ABS: 0 10*3/uL (ref 0.0–0.7)
EOS PCT: 0 %
HCT: 33.5 % — ABNORMAL LOW (ref 39.0–52.0)
HEMOGLOBIN: 11.4 g/dL — AB (ref 13.0–17.0)
Lymphocytes Relative: 4 %
Lymphs Abs: 0.5 10*3/uL — ABNORMAL LOW (ref 0.7–4.0)
MCH: 29.2 pg (ref 26.0–34.0)
MCHC: 34 g/dL (ref 30.0–36.0)
MCV: 85.9 fL (ref 78.0–100.0)
MONO ABS: 0.1 10*3/uL (ref 0.1–1.0)
MONOS PCT: 1 %
NEUTROS PCT: 95 %
Neutro Abs: 10 10*3/uL — ABNORMAL HIGH (ref 1.7–7.7)
Platelets: 296 10*3/uL (ref 150–400)
RBC: 3.9 MIL/uL — ABNORMAL LOW (ref 4.22–5.81)
RDW: 16.5 % — ABNORMAL HIGH (ref 11.5–15.5)
WBC: 10.6 10*3/uL — ABNORMAL HIGH (ref 4.0–10.5)

## 2017-06-11 LAB — URINALYSIS, ROUTINE W REFLEX MICROSCOPIC
BILIRUBIN URINE: NEGATIVE
Glucose, UA: NEGATIVE mg/dL
Hgb urine dipstick: NEGATIVE
KETONES UR: NEGATIVE mg/dL
Leukocytes, UA: NEGATIVE
NITRITE: NEGATIVE
PH: 6 (ref 5.0–8.0)
Protein, ur: NEGATIVE mg/dL
Specific Gravity, Urine: 1.005 (ref 1.005–1.030)

## 2017-06-11 LAB — CBG MONITORING, ED
Glucose-Capillary: 155 mg/dL — ABNORMAL HIGH (ref 65–99)
Glucose-Capillary: 149 mg/dL — ABNORMAL HIGH (ref 65–99)

## 2017-06-11 LAB — GLUCOSE, CAPILLARY
Glucose-Capillary: 214 mg/dL — ABNORMAL HIGH (ref 65–99)
Glucose-Capillary: 267 mg/dL — ABNORMAL HIGH (ref 65–99)

## 2017-06-11 LAB — OSMOLALITY: Osmolality: 282 mOsm/kg (ref 275–295)

## 2017-06-11 LAB — CREATININE, URINE, RANDOM: Creatinine, Urine: 12.78 mg/dL

## 2017-06-11 LAB — SODIUM, URINE, RANDOM: SODIUM UR: 11 mmol/L

## 2017-06-11 LAB — OSMOLALITY, URINE: Osmolality, Ur: 189 mOsm/kg — ABNORMAL LOW (ref 300–900)

## 2017-06-11 LAB — HIV ANTIBODY (ROUTINE TESTING W REFLEX): HIV SCREEN 4TH GENERATION: NONREACTIVE

## 2017-06-11 LAB — STREP PNEUMONIAE URINARY ANTIGEN: STREP PNEUMO URINARY ANTIGEN: NEGATIVE

## 2017-06-11 MED ORDER — METHYLPREDNISOLONE SODIUM SUCC 40 MG IJ SOLR
40.0000 mg | Freq: Two times a day (BID) | INTRAMUSCULAR | Status: DC
  Administered 2017-06-12: 40 mg via INTRAVENOUS
  Filled 2017-06-11: qty 1

## 2017-06-11 MED ORDER — CALCIUM CARBONATE-VITAMIN D 500-200 MG-UNIT PO TABS
1.0000 | ORAL_TABLET | Freq: Two times a day (BID) | ORAL | Status: DC
  Administered 2017-06-11 – 2017-06-19 (×17): 1 via ORAL
  Filled 2017-06-11 (×17): qty 1

## 2017-06-11 MED ORDER — ALBUTEROL SULFATE (2.5 MG/3ML) 0.083% IN NEBU: 2.5000 mg | INHALATION_SOLUTION | RESPIRATORY_TRACT | Status: DC | PRN

## 2017-06-11 NOTE — Progress Notes (Addendum)
PROGRESS NOTE   Gilbert Dominguez  RSW:[id number]    DOB: January 21, 1946    DOA: 06/10/2017  PCP: Leonard Downing, MD   I have briefly reviewed patients previous medical records in [facility].  Brief Narrative:  71 year old male with PMH of pulmonary fibrosis, chronic hypoxic respiratory failure on home oxygen 4 L/m, ongoing tobacco abuse, chronic diastolic CHF, atrial flutter on Eliquis and HTN presented to the ED with worsening dyspnea over last couple of days, no significant change in his chronic cough, some worsening nausea without vomiting. Chronic leg edema. In ED, hypoxic in the 80s on 4 L oxygen, blood pressure 90/70, chest x-ray concerning for pneumonia superimposed on chronic lung disease, sodium 118 down from 134 week ago, creatinine 1.65. Admitted to step down for evaluation and management of acute on chronic hypoxic respiratory failure suspected secondary to healthcare associated pneumonia and for hyponatremia. Improving.   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 Healthcare associated pneumonia: Continue empirically started vancomycin and cefepime. Urine pneumococcal antigen negative. Urine Legionella antigen, blood cultures: Pending. HIV screen nonreactive. 2. Acute on chronic hypoxic respiratory failure: Secondary to HCAP complicating underlying pulmonary fibrosis and chronic CHF. Briefly on BiPAP overnight in the ED. Transitioned to nasal cannula oxygen 4 L/m in the ED this morning and saturating at 88-89 percent. Reports breathing back to baseline. Monitor  closely. 3. Hyponatremia: Sodium 118 on admission, down from 134 on 8/16. Urine osmolarity 189, urine sodium 11. Likely multifactorial from poor oral intake, some intravascular volume depletion. Received NS 500 mL 1 in ED. Sodium up to 127 > 129 >130. No further IV fluids. Follow BMP in a.m. 4. Acute kidney injury: Presented with serum creatinine of 1.65, 0.88 on 8/16. Does have significant third spacing despite mild intravascular volume depletion. Brief IV fluids completed. Creatinine improved. Renal ultrasound negative for hydronephrosis. Consider resuming Bumex in am. 5. Atrial flutter: Rate controlled. CHADS-VASc is 32 (age, CHF, HTN, DM). Continue Eliquis and diltiazem   6. Chronic diastolic CHF: Patient reports his ideal weight to be at 117 but here at 132. Consider resuming Bumex in a.m. He seems to have predominantly right heart failure due to pulmonary hypertension. Sees Dr. Melvyn Novas, Pulmonology and Dr. Debara Pickett, cardiology. 7. DM 2: A1c 6.9 in July 2018. SSI. Holding metformin. 8. Interstitial lung disease: Oral prednisone was switched to IV steroids. No clinical bronchospasm. 9. Anemia: Follow CBCs. 10. Transaminitis:? Hepatic congestion. Follow LFTs.   DVT prophylaxis: Eliquis Code Status: Full Family Communication: None at bedside Disposition: DC home when medically improved.   Consultants:  None   Procedures:  BiPAP  Antimicrobials:  Cefepime and vancomycin.    Subjective: Seen this morning while in ED. States that he feels much better and breathing almost close to baseline. No further nausea. No pain reported. Mostly nonproductive cough. Reports decreased appetite over couple days. Reports his weight is supposed to be at 117 but was at 122 and indicates that his weight fracture weights. Denies salt in diet and claims compliance with diuretics.   ROS: No dizziness or lightheadedness reported.  Objective:  Vitals:  06/11/17 1300 06/11/17 1345 06/11/17 1445 06/11/17 1531   BP: 119/73 92/73 100/67 94/76  Pulse:    (!) 118  Resp: _0 Temp:    (!) 97.5 F (36.4 C)  TempSrc:    Axillary  SpO2:    96%  Weight:    60.2 kg (132 lb 11.5 oz)  Height:    _1  (1.6 m)    Examination:  General exam: Pleasant elderly male, lying him for W propped up in bed without distress. Respiratory system: Slightly diminished breath sounds in the bases with few coarse/chronic sounding basilar crackles. No wheezing or rhonchi. Rest of lung fields clear to auscultation. Respiratory effort normal. Cardiovascular system: S1 & S2 heard, RRR. No JVD, murmurs, rubs, gallops or clicks. 2+ pitting leg edema extending to upper thigh. Gastrointestinal system: Abdomen is nondistended, soft and nontender. No organomegaly or masses felt. Normal bowel sounds heard. Central nervous system: Alert and oriented. No focal neurological deficits. Extremities: Symmetric 5 x 5 power. Skin: No rashes, lesions or ulcers Psychiatry: Judgement and insight appear normal. Mood & affect appropriate.     Data Reviewed: I have personally reviewed following labs and imaging studies  CBC:  Recent Labs Lab 06/10/17 2016 06/11/17 0446  WBC 12.0* 10.6*  NEUTROABS 11.2* 10.0*  HGB 13.2 11.4*  HCT 39.6 33.5*  MCV 87.0 85.9  PLT 410* 292   Basic Metabolic Panel:  Recent Labs Lab 06/10/17 2016 06/11/17 0446 06/11/17 0848 06/11/17 1541  NA 118* 127* 129* 130*  K 4.0 3.8 3.9 3.6  CL 81* 88* 90* 91*  CO2 19* _2 GLUCOSE 204* 150* 131* 180*  BUN 55* 53* 49* 49*  CREATININE 1.65* 1.36* 1.33* 1.23  CALCIUM 8.7* 8.9 8.9 8.9   Liver Function Tests:  Recent Labs Lab 06/10/17 2016  AST 66*  ALT 97*  ALKPHOS 134*  BILITOT 1.3*  PROT 7.2  ALBUMIN 3.2*   Coagulation Profile: No results for input(s): INR, PROTIME in the last 168 hours. Cardiac Enzymes: No results for input(s): CKTOTAL, CKMB, CKMBINDEX, TROPONINI in the last 168 hours. HbA1C: No results for input(s): HGBA1C in  the last 72 hours. CBG:  Recent Labs Lab 06/11/17 0728 06/11/17 1136 06/11/17 1606  GLUCAP 155* 149* 214*    No results found for this or any previous visit (from the past 240 hour(s)).       Radiology Studies: US Renal  Result Date: 06/11/2017 CLINICAL DATA:  Acute kidney injury EXAM: RENAL / URINARY TRACT ULTRASOUND COMPLETE COMPARISON:  None. FINDINGS: Right Kidney: Length: 10 cm. Echogenic appearance. No mass or hydronephrosis visualized. Left Kidney: Length: 10 cm. Subjectively normal echogenicity. Cortical thinning measuring 1 cm. No visible hydronephrosis. Limited visualization due to narrow sonographic windows with no gross mass lesion. Bladder: Appears normal for degree of bladder distention. Small volume ascites seen about the liver. IMPRESSION: 1. Negative for hydronephrosis. 2. Possible medical renal disease. Mild left renal cortical thinning. 3. Small ascites. Electronically Signed   By: Monte Fantasia M.D.   On: 06/11/2017 04:40   Dg Abdomen Acute W/chest  Result Date: 06/10/2017 CLINICAL DATA:  Shortness of breath. EXAM: DG ABDOMEN ACUTE W/ 1V CHEST COMPARISON:  May 31, 2017 FINDINGS: Bibasilar opacities are identified. These are not significantly changed since May 31, 2017 but they have increased since April 28, 2017. IMPRESSION: Bibasilar opacities, worsened since April 28, 2017 are thought to represent an acute on chronic process such as infiltrate overlying basilar scarring or  fibrosis. No other changes. Electronically Signed   By: Dorise Bullion III M.D   On: 06/10/2017 20:19        Scheduled Meds: . apixaban  5 mg Oral BID  . calcium-vitamin D  1 tablet Oral BID  . dextromethorphan-guaiFENesin  1 tablet Oral BID  . diltiazem  360 mg Oral Daily  . famotidine  20 mg Oral QHS  . feeding supplement (PRO-STAT SUGAR FREE 64)  30 mL Oral BID  . insulin aspart  0-5 Units Subcutaneous QHS  . insulin aspart  0-9 Units Subcutaneous TID WC  . loratadine  10 mg  Oral Daily  . methylPREDNISolone (SOLU-MEDROL) injection  40 mg Intravenous Q8H  . pantoprazole  40 mg Oral Daily  . terazosin  2 mg Oral QHS   Continuous Infusions: . ceFEPime (MAXIPIME) IV 2 g (06/11/17 1710)  . vancomycin Stopped (06/11/17 1418)     LOS: 1 day     Gilbert Levi, MD, FACP, FHM. [facility] Pager [phone]  If 7PM-7AM, please contact night-coverage [email or web address] Password [email or web address] 06/11/2017, 5:50 PM

## 2017-06-11 NOTE — ED Notes (Signed)
Pt removed from bipap and trialed on home O2, 4LNC with sats 97%.

## 2017-06-11 NOTE — Evaluation (Signed)
Physical Therapy Evaluation Patient Details Name: Gilbert Dominguez MRN: [id number] DOB: [birth year] Today's Date: 06/11/2017   History of Present Illness  Pt is a 71 y/o male admitted secondary to HCAP. PMH includes pulmonary fibrosis on 4L of oxygen, pulmonary HTN, RA, CHF, and DM.   Clinical Impression  Pt presenting with problem above and deficits below. PTA, pt was independent with mobility using RW. Upon eval, pt limited by decreased cardiopulmonary endurance, decreased oxygen sats, weakness, and decreased balance. Required min guard assist for mobility, and only able to tolerate short distance secondary to decreased oxygen sats; see gait section for details. Feel pt would benefit from HHPT to address deficits below. Will continue to follow acutely to maximize functional mobility independence and safety.     Follow Up Recommendations Home health PT;Supervision for mobility/OOB    Equipment Recommendations  None recommended by PT    Recommendations for Other Services OT consult     Precautions / Restrictions Precautions Precautions: Other (comment) Precaution Comments: watch HR and 02 Restrictions Weight Bearing Restrictions: No      Mobility  Bed Mobility Overal bed mobility: Needs Assistance Bed Mobility: Supine to Sit;Sit to Supine     Supine to sit: Supervision Sit to supine: Supervision   General bed mobility comments: Supervision for safety and line management.   Transfers Overall transfer level: Needs assistance Equipment used: Rolling walker (2 wheeled) Transfers: Sit to/from Stand Sit to Stand: Min guard         General transfer comment: Min guard for safety.   Ambulation/Gait Ambulation/Gait assistance: Min guard Ambulation Distance (Feet): 10 Feet Assistive device: Rolling walker (2 wheeled) Gait Pattern/deviations: Step-through pattern;Decreased stride length;Trunk flexed Gait velocity: decreased Gait velocity interpretation: Below normal speed  for age/gender General Gait Details: Slow, slightly unsteady gait. RHN from 90s to 120s. Oxygen sats decreased to 85% on 4L with short ambulation distance, however, upon return to supine elevated to 98% on 4L with pursed lip breathing.   Stairs            Wheelchair Mobility    Modified Rankin (Stroke Patients Only)       Balance Overall balance assessment: Needs assistance Sitting-balance support: Feet supported;No upper extremity supported Sitting balance-Leahy Scale: Good     Standing balance support: During functional activity;No upper extremity supported Standing balance-Leahy Scale: Poor Standing balance comment: Reliant on UEs for support in standing.                              Pertinent Vitals/Pain Pain Assessment: No/denies pain    Home Living Family/patient expects to be discharged to:: Private residence Living Arrangements: Spouse/significant other Available Help at Discharge: Family;Available 24 hours/day (very limited assist she is able to provide) Type of Home: House Home Access: Ramped entrance     Home Layout: One level Home Equipment: Shower seat;Grab bars - tub/shower;Walker - 2 wheels;Walker - 4 wheels      Prior Function Level of Independence: Independent with assistive device(s)         Comments: Uses RW as needed. Uses 4L of oxygen, however, uses more when going long distances.      Hand Dominance        Extremity/Trunk Assessment   Upper Extremity Assessment Upper Extremity Assessment: Defer to OT evaluation    Lower Extremity Assessment Lower Extremity Assessment: Generalized weakness    Cervical / Trunk Assessment Cervical / Trunk Assessment: Kyphotic  Communication  Communication: HOH  Cognition Arousal/Alertness: Awake/alert Behavior During Therapy: WFL for tasks assessed/performed Overall Cognitive Status: Within Functional Limits for tasks assessed                                         General Comments      Exercises     Assessment/Plan    PT Assessment Patient needs continued PT services  PT Problem List Decreased strength;Decreased mobility;Cardiopulmonary status limiting activity;Decreased activity tolerance;Decreased balance       PT Treatment Interventions Therapeutic activities;Gait training;Therapeutic exercise;Patient/family education;Balance training;Functional mobility training    PT Goals (Current goals can be found in the Care Plan section)  Acute Rehab PT Goals Patient Stated Goal: to feel better  PT Goal Formulation: With patient Time For Goal Achievement: 06/25/17 Potential to Achieve Goals: Fair    Frequency Min 3X/week   Barriers to discharge Decreased caregiver support Wife not able to physically assist pt     Co-evaluation               AM-PAC PT "6 Clicks" Daily Activity  Outcome Measure Difficulty turning over in bed (including adjusting bedclothes, sheets and blankets)?: None Difficulty moving from lying on back to sitting on the side of the bed? : None Difficulty sitting down on and standing up from a chair with arms (e.g., wheelchair, bedside commode, etc,.)?: Unable Help needed moving to and from a bed to chair (including a wheelchair)?: A Little Help needed walking in hospital room?: A Little Help needed climbing 3-5 steps with a railing? : A Little 6 Click Score: 18    End of Session Equipment Utilized During Treatment: Oxygen Activity Tolerance: Treatment limited secondary to medical complications (Comment) (decreased oxygen sats ) Patient left: in bed;with call bell/phone within reach Nurse Communication: Mobility status PT Visit Diagnosis: Muscle weakness (generalized) (M62.81)    Time: 0301-3143 PT Time Calculation (min) (ACUTE ONLY): 15 min   Charges:   PT Evaluation $PT Eval Moderate Complexity: 1 Mod     PT G Codes:        Leighton Ruff, PT, DPT  Acute Rehabilitation Services  Pager:  [phone]   [name] 06/11/2017, 3:45 PM

## 2017-06-11 NOTE — ED Notes (Signed)
Attempted to collect UA. Pt unable to provide at this time. Pt states he will let this tech know when he has to void. RN notified.

## 2017-06-11 NOTE — ED Notes (Signed)
Pt aware needs urine, refusing in/out cath

## 2017-06-12 LAB — CBC
HEMATOCRIT: 34.3 % — AB (ref 39.0–52.0)
Hemoglobin: 11.2 g/dL — ABNORMAL LOW (ref 13.0–17.0)
MCH: 28.9 pg (ref 26.0–34.0)
MCHC: 32.7 g/dL (ref 30.0–36.0)
MCV: 88.4 fL (ref 78.0–100.0)
Platelets: 286 10*3/uL (ref 150–400)
RBC: 3.88 MIL/uL — ABNORMAL LOW (ref 4.22–5.81)
RDW: 16.8 % — AB (ref 11.5–15.5)
WBC: 8.8 10*3/uL (ref 4.0–10.5)

## 2017-06-12 LAB — BASIC METABOLIC PANEL
ANION GAP: 11 (ref 5–15)
BUN: 51 mg/dL — ABNORMAL HIGH (ref 6–20)
CALCIUM: 8.7 mg/dL — AB (ref 8.9–10.3)
CO2: 26 mmol/L (ref 22–32)
CREATININE: 1.18 mg/dL (ref 0.61–1.24)
Chloride: 91 mmol/L — ABNORMAL LOW (ref 101–111)
GFR, EST NON AFRICAN AMERICAN: 60 mL/min — AB (ref >60)
Glucose, Bld: 161 mg/dL — ABNORMAL HIGH (ref 65–99)
Potassium: 4.1 mmol/L (ref 3.5–5.1)
SODIUM: 128 mmol/L — AB (ref 135–145)

## 2017-06-12 LAB — GLUCOSE, CAPILLARY
GLUCOSE-CAPILLARY: 259 mg/dL — AB (ref 65–99)
GLUCOSE-CAPILLARY: 290 mg/dL — AB (ref 65–99)
GLUCOSE-CAPILLARY: 256 mg/dL — AB (ref 65–99)
Glucose-Capillary: 239 mg/dL — ABNORMAL HIGH (ref 65–99)

## 2017-06-12 LAB — LEGIONELLA PNEUMOPHILA SEROGP 1 UR AG: L. pneumophila Serogp 1 Ur Ag: NEGATIVE

## 2017-06-12 LAB — UREA NITROGEN, URINE: UREA NITROGEN UR: 268 mg/dL

## 2017-06-12 MED ORDER — PREDNISONE 20 MG PO TABS
20.0000 mg | ORAL_TABLET | Freq: Every day | ORAL | Status: DC
  Administered 2017-06-13 – 2017-06-19 (×7): 20 mg via ORAL
  Filled 2017-06-12 (×7): qty 1

## 2017-06-12 MED ORDER — INSULIN GLARGINE 100 UNIT/ML ~~LOC~~ SOLN
5.0000 [IU] | Freq: Every day | SUBCUTANEOUS | Status: DC
  Administered 2017-06-12 – 2017-06-15 (×4): 5 [IU] via SUBCUTANEOUS
  Filled 2017-06-12 (×4): qty 0.05

## 2017-06-12 MED ORDER — DILTIAZEM HCL ER COATED BEADS 300 MG PO CP24
300.0000 mg | ORAL_CAPSULE | Freq: Every day | ORAL | Status: DC
  Administered 2017-06-13 – 2017-06-16 (×3): 300 mg via ORAL
  Filled 2017-06-12 (×4): qty 1

## 2017-06-12 MED ORDER — POTASSIUM CHLORIDE CRYS ER 20 MEQ PO TBCR
20.0000 meq | EXTENDED_RELEASE_TABLET | Freq: Every day | ORAL | Status: DC
  Administered 2017-06-12 – 2017-06-13 (×2): 20 meq via ORAL
  Filled 2017-06-12 (×2): qty 1

## 2017-06-12 MED ORDER — BUMETANIDE 2 MG PO TABS
2.0000 mg | ORAL_TABLET | Freq: Two times a day (BID) | ORAL | Status: DC
  Administered 2017-06-12 – 2017-06-19 (×15): 2 mg via ORAL
  Filled 2017-06-12 (×18): qty 1

## 2017-06-12 NOTE — Progress Notes (Addendum)
PROGRESS NOTE   Gilbert Dominguez  ERX:[id number]    DOB: Apr 26, 1946    DOA: 06/10/2017  PCP: Leonard Downing, MD   I have briefly reviewed patients previous medical records in [facility].  Brief Narrative:  71 year old male with PMH of pulmonary fibrosis, chronic hypoxic respiratory failure on home oxygen 4 L/m, ongoing tobacco abuse, chronic diastolic CHF, atrial flutter on Eliquis and HTN presented to the ED with worsening dyspnea over last couple of days, no significant change in his chronic cough, some worsening nausea without vomiting. Chronic leg edema. In ED, hypoxic in the 80s on 4 L oxygen, blood pressure 90/70, chest x-ray concerning for pneumonia superimposed on chronic lung disease, sodium 118 down from 134 week ago, creatinine 1.65. Admitted to step down for evaluation and management of acute on chronic hypoxic respiratory failure suspected secondary to healthcare associated pneumonia and for hyponatremia. Improving.   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 Healthcare associated pneumonia: Continue empirically started vancomycin and cefepime. Urine pneumococcal antigen negative. Urine Legionella antigen: Negative, blood cultures: Negative to date. HIV screen nonreactive. Improving 2. Acute on chronic hypoxic respiratory failure: Secondary to HCAP complicating underlying pulmonary fibrosis and chronic CHF. Briefly on BiPAP overnight in the ED. Reports breathing back to baseline. Currently saturating in the high 90s on 5 L/m oxygen, wean as tolerated to maintain saturations  between 88-92 percent. 3. Hyponatremia: Sodium 118 on admission, down from 134 on 8/16. Urine osmolarity 189, urine sodium 11. Likely multifactorial from poor oral intake, some intravascular volume depletion. Received NS 500 mL 1 in ED. Sodium up to 127 > 129 >130 >128. No further IV fluids. Follow BMP in a.m. Now mostly due to hypervolemic hypo-natremia. Resume home dose of Bumex. Sodium and fluid restriction. 4. Acute kidney injury: Presented with serum creatinine of 1.65, 0.88 on 8/16. Does have significant third spacing despite mild intravascular volume depletion. Brief IV fluids completed. Creatinine normalized. Renal ultrasound negative for hydronephrosis. Monitor BMP while on Bumex. 5. Atrial flutter: Rate controlled. CHADS-VASc is 31 (age, CHF, HTN, DM). Continue Eliquis and diltiazem. Reduce Cardizem from 360 to 300 MG daily due to soft blood pressures.   6. 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 Resumed Bumex. He seems to have predominantly right heart failure due to pulmonary hypertension. Sees Dr. Melvyn Novas, Pulmonology and Dr. Debara Pickett, cardiology. 7. DM 2: A1c 6.9 in July 2018. SSI. Holding metformin. Added Lantus 5 units daily. 8. Interstitial lung disease: Oral prednisone was switched to IV steroids> switched back to home PO Prednisone starting 8/26. No clinical bronchospasm. 9. Anemia: Stable 10. Transaminitis:? Hepatic congestion. Follow LFTs.   DVT prophylaxis: Eliquis Code Status: Full Family Communication: None at bedside Disposition: DC home when medically improved.   Consultants:  None   Procedures:  BiPAP  Antimicrobials:  Cefepime and vancomycin.    Subjective: Cough improving. Denies dyspnea and states that breathing is almost back to baseline. No chest pain, fever or chills reported. As per RN, no acute issues.  ROS: No dizziness or lightheadedness  reported.  Objective:  Vitals:   06/12/17 0728 06/12/17 1153 06/12/17 1228 06/12/17 1248  BP: 101/76 (!) 86/75 92/72 (!) 95/50  Pulse: 98 (!) 101    Resp:      Temp: 97.6 F (36.4 C) 97.9 F (36.6 C)    TempSrc: Oral Oral    SpO2: 100% 95% 97%   Weight:      Height:        Examination:  General exam: Pleasant elderly male, moderately built, frail and cachectic, chronically ill looking, lying comfortably propped up in bed. Respiratory system: Slightly diminished breath sounds in the bases with few coarse/chronic sounding basilar crackles. No wheezing or rhonchi. Rest of lung fields clear to auscultation. Respiratory effort normal. Stable. Cardiovascular system: S1 & S2 heard, RRR. No JVD, murmurs, rubs, gallops or clicks. 2+ pitting leg edema extending to upper thigh. Telemetry: A. fib with controlled ventricular rate. Gastrointestinal system: Abdomen is nondistended, soft and nontender. No organomegaly or masses felt. Normal bowel sounds heard. Stable. Central nervous system: Alert and oriented. No focal neurological deficits. Stable. Extremities: Symmetric 5 x 5 power. Skin: No rashes, lesions or ulcers Psychiatry: Judgement and insight appear normal. Mood & affect appropriate.     Data Reviewed: I have personally reviewed following labs and imaging studies  CBC:  Recent Labs Lab 06/10/17 2016 06/11/17 0446 06/12/17 0213  WBC 12.0* 10.6* 8.8  NEUTROABS 11.2* 10.0*  --   HGB 13.2 11.4* 11.2*  HCT 39.6 33.5* 34.3*  MCV 87.0 85.9 88.4  PLT 410* 296 366   Basic Metabolic Panel:  Recent Labs Lab 06/10/17 2016 06/11/17 0446 06/11/17 0848 06/11/17 1541 06/12/17 0213  NA 118* 127* 129* 130* 128*  K 4.0 3.8 3.9 3.6 4.1  CL 81* 88* 90* 91* 91*  CO2 19* _0 GLUCOSE 204* 150* 131* 180* 161*  BUN 55* 53* 49* 49* 51*  CREATININE 1.65* 1.36* 1.33* 1.23 1.18  CALCIUM 8.7* 8.9 8.9 8.9 8.7*   Liver Function Tests:  Recent Labs Lab 06/10/17 2016  AST 66*   ALT 97*  ALKPHOS 134*  BILITOT 1.3*  PROT 7.2  ALBUMIN 3.2*   Coagulation Profile: No results for input(s): INR, PROTIME in the last 168 hours. Cardiac Enzymes: No results for input(s): CKTOTAL, CKMB, CKMBINDEX, TROPONINI in the last 168 hours. HbA1C: No results for input(s): HGBA1C in the last 72 hours. CBG:  Recent Labs Lab 06/11/17 1136 06/11/17 1606 06/11/17 2023 06/12/17 0726 06/12/17 1145  GLUCAP 149* 214* 267* 259* 290*    Recent Results (from the past 240 hour(s))  Culture, blood (routine x 2) Call MD if unable to obtain prior to antibiotics being given     Status: None (Preliminary result)   Collection Time: 06/11/17  8:55 AM  Result Value Ref Range Status   Specimen Description BLOOD LEFT ANTECUBITAL  Final   Special Requests   Final    BOTTLES DRAWN AEROBIC AND ANAEROBIC Blood Culture adequate volume   Culture NO GROWTH 1 DAY  Final   Report Status PENDING  Incomplete  Culture, blood (routine x 2) Call MD if unable to obtain prior to antibiotics being given     Status: None (Preliminary result)   Collection Time: 06/11/17  9:04 AM  Result Value Ref Range Status   Specimen Description BLOOD LEFT WRIST  Final   Special Requests IN PEDIATRIC BOTTLE Blood Culture adequate volume  Final   Culture NO GROWTH 1 DAY  Final   Report Status PENDING  Incomplete         Radiology Studies: US Renal  Result Date: 06/11/2017 CLINICAL DATA:  Acute kidney injury EXAM: RENAL / URINARY TRACT ULTRASOUND COMPLETE COMPARISON:  None. FINDINGS: Right Kidney: Length: 10 cm. Echogenic appearance. No mass or hydronephrosis visualized. Left Kidney: Length: 10 cm. Subjectively normal echogenicity. Cortical thinning measuring 1 cm. No visible hydronephrosis. Limited visualization due to narrow sonographic windows with no gross mass lesion. Bladder: Appears normal for degree of bladder distention. Small volume ascites seen about the liver. IMPRESSION: 1. Negative for hydronephrosis. 2.  Possible medical renal disease. Mild left renal cortical thinning. 3. Small ascites. Electronically Signed   By: Monte Fantasia M.D.   On: 06/11/2017 04:40   Dg Abdomen Acute W/chest  Result Date: 06/10/2017 CLINICAL DATA:  Shortness of breath. EXAM: DG ABDOMEN ACUTE W/ 1V CHEST COMPARISON:  May 31, 2017 FINDINGS: Bibasilar opacities are identified. These are not significantly changed since May 31, 2017 but they have increased since April 28, 2017. IMPRESSION: Bibasilar opacities, worsened since April 28, 2017 are thought to represent an acute on chronic process such as infiltrate overlying basilar scarring or fibrosis. No other changes. Electronically Signed   By: Dorise Bullion III M.D   On: 06/10/2017 20:19        Scheduled Meds: . apixaban  5 mg Oral BID  . bumetanide  2 mg Oral BID  . calcium-vitamin D  1 tablet Oral BID  . dextromethorphan-guaiFENesin  1 tablet Oral BID  . diltiazem  360 mg Oral Daily  . famotidine  20 mg Oral QHS  . feeding supplement (PRO-STAT SUGAR FREE 64)  30 mL Oral BID  . insulin aspart  0-5 Units Subcutaneous QHS  . insulin aspart  0-9 Units Subcutaneous TID WC  . loratadine  10 mg Oral Daily  . methylPREDNISolone (SOLU-MEDROL) injection  40 mg Intravenous Q12H  . pantoprazole  40 mg Oral Daily  . potassium chloride SA  20 mEq Oral Daily  . terazosin  2 mg Oral QHS   Continuous Infusions: . ceFEPime (MAXIPIME) IV Stopped (06/11/17 1740)  . vancomycin 500 mg (06/12/17 1225)     LOS: 2 days     HONGALGI,ANAND, MD, FACP, FHM. [facility] Pager [phone]  If 7PM-7AM, please contact night-coverage [email or web address] Password [email or web address] 06/12/2017, 2:11 PM

## 2017-06-12 NOTE — Progress Notes (Signed)
Pt refused cpap and stated that he will only wear it when he feels like he needs it. RT informed pt to call if needed it later.

## 2017-06-12 NOTE — Progress Notes (Signed)
Pt. Agreed to wear @ this time, was not able to tolerate due to uses Nasal Pillows @ home, placed back on 4 lpm n/c per his home regimen.

## 2017-06-13 LAB — BASIC METABOLIC PANEL
Anion gap: 11 (ref 5–15)
BUN: 45 mg/dL — ABNORMAL HIGH (ref 6–20)
CALCIUM: 8.3 mg/dL — AB (ref 8.9–10.3)
CHLORIDE: 94 mmol/L — AB (ref 101–111)
CO2: 26 mmol/L (ref 22–32)
CREATININE: 1.02 mg/dL (ref 0.61–1.24)
GFR calc non Af Amer: >60 mL/min (ref >60)
GLUCOSE: 257 mg/dL — AB (ref 65–99)
Potassium: 3.6 mmol/L (ref 3.5–5.1)
Sodium: 131 mmol/L — ABNORMAL LOW (ref 135–145)

## 2017-06-13 LAB — GLUCOSE, CAPILLARY
GLUCOSE-CAPILLARY: 224 mg/dL — AB (ref 65–99)
Glucose-Capillary: 261 mg/dL — ABNORMAL HIGH (ref 65–99)
Glucose-Capillary: 188 mg/dL — ABNORMAL HIGH (ref 65–99)
Glucose-Capillary: 233 mg/dL — ABNORMAL HIGH (ref 65–99)

## 2017-06-13 MED ORDER — ORAL CARE MOUTH RINSE
15.0000 mL | Freq: Two times a day (BID) | OROMUCOSAL | Status: DC
  Administered 2017-06-13 – 2017-06-19 (×8): 15 mL via OROMUCOSAL

## 2017-06-13 MED ORDER — SODIUM CHLORIDE 0.9 % IV BOLUS (SEPSIS)
500.0000 mL | Freq: Once | INTRAVENOUS | Status: AC
  Administered 2017-06-13: 500 mL via INTRAVENOUS

## 2017-06-13 MED ORDER — DILTIAZEM HCL 100 MG IV SOLR
5.0000 mg/h | INTRAVENOUS | Status: DC
  Administered 2017-06-13: 5 mg/h via INTRAVENOUS
  Administered 2017-06-14 – 2017-06-15 (×2): 10 mg/h via INTRAVENOUS
  Filled 2017-06-13 (×4): qty 100

## 2017-06-13 MED ORDER — DILTIAZEM HCL 25 MG/5ML IV SOLN
10.0000 mg | Freq: Once | INTRAVENOUS | Status: AC
  Administered 2017-06-13: 10 mg via INTRAVENOUS
  Filled 2017-06-13: qty 5

## 2017-06-13 NOTE — Progress Notes (Signed)
Pt in rapid afib/flutter-140s.  Schorr, NP notified.  500cc NS bolus and 54m cardizem bolus given.  HR now -115.  Will monitor pt.

## 2017-06-13 NOTE — Progress Notes (Signed)
Pharmacy Antibiotic Note  Gilbert Dominguez is a 71 y.o. male admitted on 06/10/2017 with pneumonia.  Pharmacy has been consulted for Vancomycin dosing. WBC wnl, and patient is afebrile. He came in with AKI and SCr of 1.65 (BL ~ 0.88), which now has improved to 1.02, nCrCl ~ 60-65 mL/min. Patient also on Cefepime per MD.   Plan: -Continue vancomycin 500 mg IV Q 12 hours -Continue Cefepime per MD -Monitor CBC, renal fx, cultures and clinical progress -Obtain vancomycin trough before next dose  Height: _0  (160 cm) Weight: 126 lb 9.6 oz (57.4 kg) IBW/kg (Calculated) : 56.9  Temp (24hrs), Avg:97.7 F (36.5 C), Min:97.5 F (36.4 C), Max:98.1 F (36.7 C)   Recent Labs Lab 06/10/17 2016 06/11/17 0446 06/11/17 0848 06/11/17 1541 06/12/17 0213 06/13/17 0348  WBC 12.0* 10.6*  --   --  8.8  --   CREATININE 1.65* 1.36* 1.33* 1.23 1.18 1.02    Estimated Creatinine Clearance: 52.7 mL/min (by C-G formula based on SCr of 1.02 mg/dL).    Allergies  Allergen Reactions  . Lovastatin Rash    Antimicrobials this admission: 8/23 Vanc >>  8/23 Cefepime >>   Dose adjustments this admission: None  Microbiology results: 8/23 MRSA PCR: neg 8/24 BCx: pending   Leroy Libman, PharmD Pharmacy Resident Pager: [phone]

## 2017-06-13 NOTE — Progress Notes (Signed)
PROGRESS NOTE   Gilbert Dominguez  NID:[id number]    DOB: 12-Mar-1946    DOA: 06/10/2017  PCP: Leonard Downing, MD   I have briefly reviewed patients previous medical records in [facility].  Brief Narrative:  71 year old male with PMH of pulmonary fibrosis, chronic hypoxic respiratory failure on home oxygen 4 L/m, ongoing tobacco abuse, chronic diastolic CHF, atrial flutter on Eliquis and HTN presented to the ED with worsening dyspnea over last couple of days, no significant change in his chronic cough, some worsening nausea without vomiting. Chronic leg edema. In ED, hypoxic in the 80s on 4 L oxygen, blood pressure 90/70, chest x-ray concerning for pneumonia superimposed on chronic lung disease, sodium 118 down from 134 week ago, creatinine 1.65. Admitted to step down for evaluation and management of acute on chronic hypoxic respiratory failure suspected secondary to healthcare associated pneumonia and for hyponatremia. Improving.   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 Healthcare associated pneumonia: Treated empirically with vancomycin and cefepime. Urine pneumococcal antigen negative. Urine Legionella antigen: Negative, blood cultures: Negative to date. HIV screen nonreactive. Improving. Since cultures negative MRSA PCR negative, DC vancomycin. Consider transitioning to oral Augmentin 8/27. 2. Acute on chronic hypoxic respiratory failure: Secondary to HCAP complicating underlying pulmonary fibrosis and chronic CHF. Briefly on BiPAP overnight in the ED. Reports breathing back to  baseline. Currently saturating in the high 90s on 4 L/m oxygen, wean as tolerated to maintain saturations between 88-92 percent. Oxygenation likely at baseline now. 3. Hyponatremia: Sodium 118 on admission, down from 134 on 8/16. Urine osmolarity 189, urine sodium 11. Likely multifactorial from poor oral intake, some intravascular volume depletion. Received NS 500 mL 1 in ED. Sodium up to 127 > 129 >130 >128 >131. No further IV fluids. Follow BMP in a.m. Now mostly due to hypervolemic hypo-natremia. Resumed home dose of Bumex. Sodium and fluid restriction. 4. Acute kidney injury: Presented with serum creatinine of 1.65, 0.88 on 8/16. Does have significant third spacing despite mild intravascular volume depletion. Brief IV fluids completed. Creatinine normalized. Renal ultrasound negative for hydronephrosis. Monitor BMP while on Bumex. Stable. 5. Atrial flutter: Rate controlled. CHADS-VASc is 38 (age, CHF, HTN, DM). Continue Eliquis and diltiazem. Reduced Cardizem from 360 to 300 MG daily due to soft blood pressures. Monitor. 6. 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 Resumed Bumex. He seems to have predominantly right heart failure due to pulmonary hypertension. Sees Dr. Melvyn Novas, Pulmonology and Dr. Debara Pickett, cardiology. Weight down from 132 pounds on 8/24 > 126 pounds on 8/26. Put out 5500 and a urine yesterday. -1.8 L thus far. 7. DM 2: A1c 6.9 in July 2018. SSI. Holding metformin. Added Lantus 5 units daily. CBGs ranging in the 200s. Now that his steroids have been switched to by mouth, expect to improve. 8. Interstitial lung disease: Oral prednisone was switched to IV steroids> switched back to home PO Prednisone starting 8/26. No clinical bronchospasm. 9. Anemia: Stable 10. Transaminitis:? Hepatic congestion. Follow LFTs.   DVT prophylaxis: Eliquis  Code Status: Full Family Communication: None at  bedside Disposition: DC home when medically improved, possibly 8/27.   Consultants:  None   Procedures:  BiPAP  Antimicrobials:  Cefepime and vancomycin.    Subjective: Chronic nonproductive cough without change. Dyspnea improved and breathing may be close to baseline. Leg swelling improving.  ROS: No dizziness or lightheadedness reported.  Objective:  Vitals:   06/12/17 2225 06/13/17 0002 06/13/17 0401 06/13/17 0730  BP: 103/74 104/81 93/71 101/73  Pulse:  (!) 103 67 (!) 106  Resp:      Temp:  (!) 97.5 F (36.4 C) 97.7 F (36.5 C) 98.1 F (36.7 C)  TempSrc:  Oral Axillary Oral  SpO2: 92% 99% 92% 97%  Weight:   57.4 kg (126 lb 9.6 oz)   Height:        Examination:  General exam: Pleasant elderly male, moderately built, frail and cachectic, chronically ill looking, lying comfortably propped up in bed. Respiratory system: Improving breath sounds in the bases with less/few coarse/chronic sounding basilar crackles. No wheezing or rhonchi. Rest of lung fields clear to auscultation. Respiratory effort normal. Stable. Cardiovascular system: S1 & S2 heard, RRR. No JVD, murmurs, rubs, gallops or clicks. 2+ pitting leg edema extending to upper thigh. Telemetry: A. fib with controlled ventricular rate. Leg edema improving. He left L>R. Gastrointestinal system: Abdomen is nondistended, soft and nontender. No organomegaly or masses felt. Normal bowel sounds heard. Stable. Central nervous system: Alert and oriented. No focal neurological deficits. Stable. Extremities: Symmetric 5 x 5 power. Skin: No rashes, lesions or ulcers Psychiatry: Judgement and insight appear normal. Mood & affect appropriate.     Data Reviewed: I have personally reviewed following labs and imaging studies  CBC:  Recent Labs Lab 06/10/17 2016 06/11/17 0446 06/12/17 0213  WBC 12.0* 10.6* 8.8  NEUTROABS 11.2* 10.0*  --   HGB 13.2 11.4* 11.2*  HCT 39.6 33.5* 34.3*  MCV 87.0 85.9 88.4  PLT 410* 296  905   Basic Metabolic Panel:  Recent Labs Lab 06/11/17 0446 06/11/17 0848 06/11/17 1541 06/12/17 0213 06/13/17 0348  NA 127* 129* 130* 128* 131*  K 3.8 3.9 3.6 4.1 3.6  CL 88* 90* 91* 91* 94*  CO2 _0 GLUCOSE 150* 131* 180* 161* 257*  BUN 53* 49* 49* 51* 45*  CREATININE 1.36* 1.33* 1.23 1.18 1.02  CALCIUM 8.9 8.9 8.9 8.7* 8.3*   Liver Function Tests:  Recent Labs Lab 06/10/17 2016  AST 66*  ALT 97*  ALKPHOS 134*  BILITOT 1.3*  PROT 7.2  ALBUMIN 3.2*   Coagulation Profile: No results for input(s): INR, PROTIME in the last 168 hours. Cardiac Enzymes: No results for input(s): CKTOTAL, CKMB, CKMBINDEX, TROPONINI in the last 168 hours. HbA1C: No results for input(s): HGBA1C in the last 72 hours. CBG:  Recent Labs Lab 06/12/17 1145 06/12/17 1731 06/12/17 2017 06/13/17 0727 06/13/17 1151  GLUCAP 290* 256* 239* 261* 224*    Recent Results (from the past 240 hour(s))  Culture, blood (routine x 2) Call MD if unable to obtain prior to antibiotics being given     Status: None (Preliminary result)   Collection Time: 06/11/17  8:55 AM  Result Value Ref Range Status   Specimen Description BLOOD LEFT ANTECUBITAL  Final   Special Requests   Final    BOTTLES DRAWN AEROBIC AND ANAEROBIC Blood Culture adequate volume   Culture NO GROWTH 2 DAYS  Final   Report Status PENDING  Incomplete  Culture, blood (routine x 2) Call MD if unable to obtain prior to antibiotics being given     Status: None (Preliminary result)   Collection Time: 06/11/17  9:04 AM  Result Value Ref Range Status   Specimen Description BLOOD LEFT WRIST  Final   Special Requests IN PEDIATRIC BOTTLE Blood Culture adequate volume  Final   Culture NO GROWTH 2 DAYS  Final   Report Status PENDING  Incomplete         Radiology Studies: No results found.      Scheduled Meds: . apixaban  5 mg Oral BID  . bumetanide  2 mg Oral BID  . calcium-vitamin D  1 tablet Oral BID  .  dextromethorphan-guaiFENesin  1 tablet Oral BID  . diltiazem  300 mg Oral Daily  . famotidine  20 mg Oral QHS  . feeding supplement (PRO-STAT SUGAR FREE 64)  30 mL Oral BID  . insulin aspart  0-5 Units Subcutaneous QHS  . insulin aspart  0-9 Units Subcutaneous TID WC  . insulin glargine  5 Units Subcutaneous Daily  . loratadine  10 mg Oral Daily  . pantoprazole  40 mg Oral Daily  . potassium chloride SA  20 mEq Oral Daily  . predniSONE  20 mg Oral Q breakfast  . terazosin  2 mg Oral QHS   Continuous Infusions: . ceFEPime (MAXIPIME) IV Stopped (06/12/17 1811)  . vancomycin Stopped (06/13/17 1137)     LOS: 3 days     Brighten Orndoff, MD, FACP, FHM. [facility] Pager [phone]  If 7PM-7AM, please contact night-coverage [email or web address] Password [email or web address] 06/13/2017, 2:55 PM

## 2017-06-14 DIAGNOSIS — J849 Interstitial pulmonary disease, unspecified: Secondary | ICD-10-CM

## 2017-06-14 DIAGNOSIS — J9621 Acute and chronic respiratory failure with hypoxia: Secondary | ICD-10-CM

## 2017-06-14 DIAGNOSIS — I4891 Unspecified atrial fibrillation: Secondary | ICD-10-CM

## 2017-06-14 DIAGNOSIS — I483 Typical atrial flutter: Secondary | ICD-10-CM

## 2017-06-14 DIAGNOSIS — J189 Pneumonia, unspecified organism: Secondary | ICD-10-CM

## 2017-06-14 DIAGNOSIS — I2723 Pulmonary hypertension due to lung diseases and hypoxia: Secondary | ICD-10-CM

## 2017-06-14 DIAGNOSIS — E871 Hypo-osmolality and hyponatremia: Secondary | ICD-10-CM

## 2017-06-14 DIAGNOSIS — I5032 Chronic diastolic (congestive) heart failure: Principal | ICD-10-CM

## 2017-06-14 DIAGNOSIS — I10 Essential (primary) hypertension: Secondary | ICD-10-CM

## 2017-06-14 LAB — COMPREHENSIVE METABOLIC PANEL
ALT: 54 U/L (ref 17–63)
AST: 49 U/L — ABNORMAL HIGH (ref 15–41)
Albumin: 2.5 g/dL — ABNORMAL LOW (ref 3.5–5.0)
Alkaline Phosphatase: 106 U/L (ref 38–126)
Anion gap: 8 (ref 5–15)
BUN: 28 mg/dL — ABNORMAL HIGH (ref 6–20)
CHLORIDE: 93 mmol/L — AB (ref 101–111)
CO2: 32 mmol/L (ref 22–32)
CREATININE: 0.75 mg/dL (ref 0.61–1.24)
Calcium: 8.2 mg/dL — ABNORMAL LOW (ref 8.9–10.3)
GFR calc non Af Amer: >60 mL/min (ref >60)
Glucose, Bld: 80 mg/dL (ref 65–99)
POTASSIUM: 3 mmol/L — AB (ref 3.5–5.1)
SODIUM: 133 mmol/L — AB (ref 135–145)
Total Bilirubin: 1.1 mg/dL (ref 0.3–1.2)
Total Protein: 5.1 g/dL — ABNORMAL LOW (ref 6.5–8.1)

## 2017-06-14 LAB — GLUCOSE, CAPILLARY
GLUCOSE-CAPILLARY: 184 mg/dL — AB (ref 65–99)
GLUCOSE-CAPILLARY: 213 mg/dL — AB (ref 65–99)
GLUCOSE-CAPILLARY: 202 mg/dL — AB (ref 65–99)
Glucose-Capillary: 70 mg/dL (ref 65–99)

## 2017-06-14 LAB — POTASSIUM: POTASSIUM: 4.1 mmol/L (ref 3.5–5.1)

## 2017-06-14 MED ORDER — DIGOXIN 0.25 MG/ML IJ SOLN
0.2500 mg | Freq: Once | INTRAMUSCULAR | Status: DC
  Filled 2017-06-14: qty 2

## 2017-06-14 MED ORDER — SODIUM CHLORIDE 0.9 % IV BOLUS (SEPSIS)
250.0000 mL | Freq: Once | INTRAVENOUS | Status: AC
  Administered 2017-06-15: 250 mL via INTRAVENOUS

## 2017-06-14 MED ORDER — DIGOXIN 250 MCG PO TABS
0.2500 mg | ORAL_TABLET | ORAL | Status: AC
  Administered 2017-06-14 (×3): 0.25 mg via ORAL
  Filled 2017-06-14 (×3): qty 1

## 2017-06-14 MED ORDER — POTASSIUM CHLORIDE CRYS ER 20 MEQ PO TBCR
20.0000 meq | EXTENDED_RELEASE_TABLET | Freq: Every day | ORAL | Status: DC
Start: 2017-06-15 — End: 2017-06-15
  Administered 2017-06-15: 20 meq via ORAL
  Filled 2017-06-14: qty 1

## 2017-06-14 MED ORDER — AMOXICILLIN-POT CLAVULANATE 875-125 MG PO TABS
1.0000 | ORAL_TABLET | Freq: Two times a day (BID) | ORAL | Status: DC
  Administered 2017-06-14 – 2017-06-15 (×3): 1 via ORAL
  Filled 2017-06-14 (×3): qty 1

## 2017-06-14 MED ORDER — DIGOXIN 250 MCG PO TABS
0.2500 mg | ORAL_TABLET | Freq: Every day | ORAL | Status: DC
  Administered 2017-06-15 – 2017-06-19 (×5): 0.25 mg via ORAL
  Filled 2017-06-14 (×5): qty 1

## 2017-06-14 MED ORDER — POTASSIUM CHLORIDE CRYS ER 20 MEQ PO TBCR
40.0000 meq | EXTENDED_RELEASE_TABLET | ORAL | Status: AC
  Administered 2017-06-14 (×2): 40 meq via ORAL
  Filled 2017-06-14 (×2): qty 2

## 2017-06-14 NOTE — Consult Note (Signed)
Cardiology Consultation:   Patient ID: Gilbert Dominguez; [id number]; Nov 06, 1945   Admit date: 06/10/2017 Date of Consult: 06/14/2017  Primary Care Provider: Leonard Downing, MD Primary Cardiologist: [facility] Primary Electrophysiologist:  Dr. Caryl Comes   Patient Profile:   Gilbert Dominguez is a 71 y.o. male with a hx of pAflutter (on Eliquis), chronic diastolic CHF, chronic respiratory failure (on 4L [place]), pulmonary fibrosis, pulmonary HTN, Stage 3 CKD, and Type 2 DM who is being seen today for the evaluation of atrial flutter with RVR at the request of Dr. Algis Liming.  History of Present Illness:   Gilbert Dominguez was admitted to the Hospital on July (7/11 - 7/19) for atrial tachycardia and CHF exacerbation,  (8/6- 8/8) for chronic respiratory failure with hypoxia, Pulmonary HTN and then  again (8/13- 8/16) with Atrial Flutter with RVR and CHF exacerbation. This admission (8/23) he is primarily being admitted for Hospital Acquired Pneumonia.  Dr. Caryl Comes (EP) consulted on Gilbert Dominguez his previous admission (06/02/17). He reported that the patient has a very abnormal right atrium and right ventricle related to pulmonary hypertension- Severe. He is concerned that the pulmonary hypertension would make him a bad candidate for an ablation procedure, he discussed this with Dr. Melvyn Novas who also expressed reluctance for an ablation procedure. Prognosis is likely 12-24 months per chart review. The plan is for rate control and anticoagulation, with the understanding that the management of his atrial flutter will be challenging. Fortunately Gilbert Dominguez does not experience palpitations with his tachycardia but only notices it on his oximeter.   Dr. Caryl Comes did document that if rate control and anticoagulation were to be not be enough they would consider an ablation but it would require local anesthesia.  He presented to the ERfor Hypoxia on his normal 4 L/m of at home oxygen, increased shortness of breath and  weakness. He has been afebrile. He was admitted with HCAP and started on empiric abx. Blood cultures are negative. The patients blood pressures became soft, therefore his Cardizem rate was reduced from 360 mg to 300 mg on 8/25. He unfortunately develop rapid flutter late last night, rates in the 140s. He was given a 500 cc of NS and a 37m Caredizem bolus his HR intiially improved to 115 last night.  This morning rates once again increased to 140s.  He currently can tell his heart rate is elevated but does not feel any palpitations or discomfort. Current HR 120-120s, BP 94/75.  Past Medical History:  Diagnosis Date  . Atrial flutter ([place]    a. initially diagnosed in 04/2017, started on Eliquis b. recurrent in 05/2017  . CHF exacerbation ([place] 02/24/2017  . Chronic respiratory failure ([place]   . Hypertension   . 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 Rheumatoid arthritis(714.0)     Past Surgical History:  Procedure Laterality Date  . ACNE CYST REMOVAL  1968  . VASECTOMY  1973     Inpatient Medications: Scheduled Meds: . apixaban  5 mg Oral BID  . bumetanide  2 mg Oral BID  . calcium-vitamin D  1 tablet Oral BID  . dextromethorphan-guaiFENesin  1 tablet Oral BID  . diltiazem  300 mg Oral Daily  . famotidine  20 mg Oral QHS  . feeding supplement (PRO-STAT SUGAR FREE 64)  30 mL Oral BID  . insulin aspart  0-5 Units Subcutaneous QHS  . insulin aspart  0-9 Units Subcutaneous TID WC  . insulin glargine  5 Units Subcutaneous Daily  . loratadine  10  mg Oral Daily  . mouth rinse  15 mL Mouth Rinse BID  . pantoprazole  40 mg Oral Daily  . [START ON 06/15/2017] potassium chloride SA  20 mEq Oral Daily  . potassium chloride  40 mEq Oral Q4H  . predniSONE  20 mg Oral Q breakfast  . terazosin  2 mg Oral QHS   Continuous Infusions: . ceFEPime (MAXIPIME) IV Stopped (06/13/17 1811)  . diltiazem (CARDIZEM) infusion 5 mg/hr (06/13/17 2321)   PRN Meds: acetaminophen, albuterol,  HYDROcodone-acetaminophen, ondansetron (ZOFRAN) IV  Allergies:    Allergies  Allergen Reactions  . Lovastatin Rash    Social History:   Social History   Social History  . Marital status: Single    Spouse name: N/A  . Number of children: 2  . Years of education: N/A   Occupational History  . Retired Optometrist    Social History Main Topics  . Smoking status: Former Smoker    Packs/day: 1.50    Years: 37.00    Types: Cigarettes    Quit date: 10/14/1995  . Smokeless tobacco: Never Used  . Alcohol use 1.8 oz/week    3 Cans of beer per week     Comment: occasionally  . Drug use: No  . Sexual activity: Not on file   Other Topics Concern  . Not on file   Social History Narrative  . No narrative on file    Family History:   Family History  Problem Relation Age of Onset  . Heart disease Father   . Emphysema Father   . Liver cancer Mother      ROS:  Please see the history of present illness.  ROS  All other ROS reviewed and negative.     Physical Exam/Data:   Vitals:   06/14/17 0400 06/14/17 0402 06/14/17 0412 06/14/17 0733  BP:  102/78  97/71  Pulse:  (!) 108  (!) 103  Resp:  14  14  Temp: 97.6 F (36.4 C)   97.7 F (36.5 C)  TempSrc: Oral   Oral  SpO2:  92%  92%  Weight:   122 lb 11.2 oz (55.7 kg)   Height:        Intake/Output Summary (Last 24 hours) at 06/14/17 1008 Last data filed at 06/14/17 0857  Gross per 24 hour  Intake             1892 ml  Output             6050 ml  Net            -4158 ml   Filed Weights   06/12/17 0445 06/13/17 0401 06/14/17 0412  Weight: 128 lb 11.2 oz (58.4 kg) 126 lb 9.6 oz (57.4 kg) 122 lb 11.2 oz (55.7 kg)   Body mass index is 21.74 kg/m.  General:  Well nourished, well developed, in no acute distress HEENT: normal Lymph: no adenopathy Neck: no JVD Endocrine:  No thryomegaly Vascular: No carotid bruits; FA pulses 2+ bilaterally without bruits  Cardiac: irreg irreg Lungs:  Crackles and wheezing  bilaterally.  Abd: soft, nontender, no hepatomegaly  Ext: no edema Musculoskeletal:  No deformities, BUE and BLE strength normal and equal Skin: warm and dry  Neuro:  CNs 2-12 intact, no focal abnormalities noted Psych:  Normal affect   EKG:  The EKG was personally reviewed and demonstrates:  Rate controlled atrial flutter I RBBB and LPFB  Telemetry:  Telemetry was personally reviewed and demonstrates:  Aflutter, rates  110-120  Relevant CV Studies:  Echocardiogram (05/01/2017) Study Conclusions  - Left ventricle: The cavity size was mildly reduced. Systolic   function was normal. The estimated ejection fraction was in the   range of 55% to 60%. Wall motion was normal; there were no   regional wall motion abnormalities. Doppler parameters are   consistent with abnormal left ventricular relaxation (grade 1   diastolic dysfunction). - Ventricular septum: The contour showed diastolic flattening and   systolic flattening. These changes are consistent with RV volume   and pressure overload. - Aortic valve: There was very mild stenosis. Valve area (VTI):   1.85 cm^2. Valve area (Vmax): 1.61 cm^2. Valve area (Vmean): 1.5   cm^2. - Right ventricle: The cavity size was severely dilated. Systolic   function was moderately to severely reduced. - Right atrium: The atrium was severely dilated. - Tricuspid valve: There was moderate-severe regurgitation directed   eccentrically. - Pulmonary arteries: Systolic pressure was severely increased. PA   peak pressure: 87 mm Hg (S). Laboratory Data:  Chemistry Recent Labs Lab 06/12/17 0213 06/13/17 0348 06/14/17 0525  NA 128* 131* 133*  K 4.1 3.6 3.0*  CL 91* 94* 93*  CO2 26 26 32  GLUCOSE 161* 257* 80  BUN 51* 45* 28*  CREATININE 1.18 1.02 0.75  CALCIUM 8.7* 8.3* 8.2*  GFRNONAA 60* >60 >60  GFRAA >60 >60 >60  ANIONGAP _0 Recent Labs Lab 06/10/17 2016 06/14/17 0525  PROT 7.2 5.1*  ALBUMIN 3.2* 2.5*  AST 66* 49*  ALT  97* 54  ALKPHOS 134* 106  BILITOT 1.3* 1.1   Hematology Recent Labs Lab 06/10/17 2016 2017-06-30 0446 06/12/17 0213  WBC 12.0* 10.6* 8.8  RBC 4.55 3.90* 3.88*  HGB 13.2 11.4* 11.2*  HCT 39.6 33.5* 34.3*  MCV 87.0 85.9 88.4  MCH 29.0 29.2 28.9  MCHC 33.3 34.0 32.7  RDW 16.7* 16.5* 16.8*  PLT 410* 296 286   Cardiac EnzymesNo results for input(s): TROPONINI in the last 168 hours.  Recent Labs Lab 06/10/17 2025  TROPIPOC 0.05    BNP Recent Labs Lab 06/10/17 2016  BNP 1,928.5*    DDimer No results for input(s): DDIMER in the last 168 hours.  Radiology/Studies:  US Renal  Result Date: Jun 30, 2017 CLINICAL DATA:  Acute kidney injury EXAM: RENAL / URINARY TRACT ULTRASOUND COMPLETE COMPARISON:  None. FINDINGS: Right Kidney: Length: 10 cm. Echogenic appearance. No mass or hydronephrosis visualized. Left Kidney: Length: 10 cm. Subjectively normal echogenicity. Cortical thinning measuring 1 cm. No visible hydronephrosis. Limited visualization due to narrow sonographic windows with no gross mass lesion. Bladder: Appears normal for degree of bladder distention. Small volume ascites seen about the liver. IMPRESSION: 1. Negative for hydronephrosis. 2. Possible medical renal disease. Mild left renal cortical thinning. 3. Small ascites. Electronically Signed   By: Monte Fantasia M.D.   On: 06-30-2017 04:40   Dg Abdomen Acute W/chest  Result Date: 06/10/2017 CLINICAL DATA:  Shortness of breath. EXAM: DG ABDOMEN ACUTE W/ 1V CHEST COMPARISON:  May 31, 2017 FINDINGS: Bibasilar opacities are identified. These are not significantly changed since May 31, 2017 but they have increased since April 28, 2017. IMPRESSION: Bibasilar opacities, worsened since April 28, 2017 are thought to represent an acute on chronic process such as infiltrate overlying basilar scarring or fibrosis. No other changes. Electronically Signed   By: Dorise Bullion III M.D   On: 06/10/2017 20:19    Assessment and Plan:  Atrial flutter: Rate controlled. CHADS-VASc is 57 (age, CHF, HTN, DM). Continue Eliquis and diltiazem. Gilbert Dominguez Cardizem dose had to be decreased from 386m to 3038ma few days ago due to soft blood pressures. He was given a 10 mg Cardizem bolus and it improved to 115, now the rate is back up to 140s without symptoms. -- EP consulted last admission, consider getting them on board again for management of this complicated Aflutter. -- BB are less desirable with his worsening SOB and pneumonia and hypotension.  HCAP: blood cultures negative getting IV abx  Hyponatremia: Was 118 on admission, now 131  Acute on Chronic hypoxic resp failure/ interstitial lung disaese: Secondary to HcAP, given a prognosis of 12-24 months by Dr. WeMelvyn Novas AKI: creatinine normalized was 1.65 on admission.  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  DM2: Medicine managing   Signed, GRLinus MakoPA-C  06/14/2017   I have seen and examined the patient along with GREENE,TIFFANY G, PA-C .  I have reviewed the chart, notes and new data.  I agree with PA's note.  Key new complaints: OK at rest, severe dyspnea when moving. Really [place] (he plans to go by car). Key examination changes: cachectic, prominent flutter waves in JVP, elevated 7-8 cm; HR 110-120, SBP 93-109. Key new findings / data: improved hyponatremia. K is 3.0, being replaced (80 mEq over 4 hours). Creat 0.75  PLAN: As anticipated, atrial flutter rate control is very challenging, especially during intercurrent illness. Very few options for rate control that will not worsen low BP. Add digoxin load and daily dose. Keep K close to 4. Recheck K this afternoon and in AM. Consult EP for RF ablation if fails to respond to rate control meds.  MiSanda KleinMD, [phone]/27/2018, 2:31 PM

## 2017-06-14 NOTE — Care Management Note (Addendum)
Case Management Note  Patient Details  Name: Jules Baty MRN: [id number] Date of Birth: [birth year]  Subjective/Objective:  Pt presented for SOB-HCAP- Hx Atrial Flutter with RVR/ CHF. Pt is from home with support of wife. Portable 02 via [employer]. CM did supply pt with RW at the last visit.                  Action/Plan: PT recommendations for HH/ PT- Pt will benefit from HH/RN for disease and medication management as well- pt is agreeable to services. MD will need to write orders with F2F once stable for d/c. Referral was made to [place] with [facility] to begin within 24-48 hours post d/c.   Expected Discharge Date:                  Expected Discharge Plan:  [place]  In-House Referral:  NA  Discharge planning Services  CM Consult  Post Acute Care Choice:  Home Health Choice offered to:  Patient  DME Arranged:  N/A DME Agency:  NA  HH Arranged:  RN, PT [place] Agency:  Other - See comment ([facility])  Status of Service:  Completed, signed off  If discussed at [place] of Stay Meetings, dates discussed:  06-15-17, 06-17-17  Additional Comments:  Bethena Roys, RN 06/14/2017, 11:28 AM

## 2017-06-14 NOTE — Plan of Care (Signed)
Problem: Education: Goal: Knowledge of [place] [facility] Education information/materials will improve Outcome: Progressing Patient aware of plan of care.  RN provided medication education on IV Cardizem.  Patient stated understanding.

## 2017-06-14 NOTE — Care Management Important Message (Signed)
Important Message  Patient Details  Name: Gilbert Dominguez MRN: [id number] Date of Birth: 06-07-1946   Medicare Important Message Given:  Yes    Orbie Pyo 06/14/2017, 1:52 PM

## 2017-06-14 NOTE — Plan of Care (Signed)
Problem: Pain Managment: Goal: General experience of comfort will improve Outcome: Progressing Patient denying pain at this time.  RN instructed patient to notify RN if he started to experience any pain.  Patient stated understanding.

## 2017-06-14 NOTE — Progress Notes (Signed)
PT Cancellation Note  Patient Details Name: Gilbert Dominguez MRN: [id number] DOB: 09-09-1946   Cancelled Treatment:    Reason Eval/Treat Not Completed: Medical issues which prohibited therapy Pt with resting HR of 120s-130s. Will hold until medically appropriate and will follow up as schedule allows.   Leighton Ruff, PT, DPT  Acute Rehabilitation Services  Pager: [phone]    [name] 06/14/2017, 1:25 PM

## 2017-06-14 NOTE — Progress Notes (Signed)
PROGRESS NOTE   Gilbert Dominguez  ASN:[id number]    DOB: Dec 02, 1945    DOA: 06/10/2017  PCP: Leonard Downing, MD   I have briefly reviewed patients previous medical records in [facility].  Brief Narrative:  71 year old male with PMH of pulmonary fibrosis, chronic hypoxic respiratory failure on home oxygen 4 L/m, ongoing tobacco abuse, chronic diastolic CHF, atrial flutter on Eliquis and HTN presented to the ED with worsening dyspnea over last couple of days, no significant change in his chronic cough, some worsening nausea without vomiting. Chronic leg edema. In ED, hypoxic in the 80s on 4 L oxygen, blood pressure 90/70, chest x-ray concerning for pneumonia superimposed on chronic lung disease, sodium 118 down from 134 week ago, creatinine 1.65. Admitted to step down for evaluation and management of acute on chronic hypoxic respiratory failure suspected secondary to healthcare associated pneumonia and for hyponatremia.Patient was clinically improving. However on night of 8/26, went into A. fib with RVR in the 140s (in the context of home Cardizem dose being marginally reduced due to soft blood pressures). Cardiology consulted on 8/27.   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 Healthcare associated pneumonia: Treated empirically with vancomycin and cefepime. Urine pneumococcal antigen negative. Urine Legionella antigen: Negative, blood cultures: Negative to date. HIV screen nonreactive. Improving. Since cultures negative MRSA PCR negative, DC vancomycin.  Transitioned to oral Augmentin on 8/27. 2. Acute on chronic hypoxic respiratory failure: Secondary to HCAP complicating underlying pulmonary fibrosis and chronic CHF. Briefly on BiPAP overnight in the ED. Reports breathing back to baseline. Currently saturating in the low 90s on 4 L/m oxygen, wean as tolerated to maintain saturations between 88-92 percent. Oxygenation likely at baseline now. Still with significant dyspnea on exertion. 3. Hyponatremia: Sodium 118 on admission, down from 134 on 8/16. Urine osmolarity 189, urine sodium 11. Likely multifactorial from poor oral intake, some intravascular volume depletion. Received NS 500 mL 1 in ED. Sodium up to 127 > 129 >130 >128 >131 >133. No further IV fluids. Follow BMP in a.m. Now mostly due to hypervolemic hypo-natremia. Resumed home dose of Bumex. Sodium and fluid restriction. 4. Acute kidney injury: Presented with serum creatinine of 1.65, 0.88 on 8/16. Does have significant third spacing despite mild intravascular volume depletion. Brief IV fluids completed. Creatinine normalized. Renal ultrasound negative for hydronephrosis. Monitor BMP while on Bumex. Stable. 5. Atrial flutter with RVR: Rate controlled. CHADS-VASc is 78 (age, CHF, HTN, DM). Continue Eliquis and diltiazem. Reduced Cardizem from 360 to 300 MG daily due to soft blood pressures. Overnight 8/26, went into A. fib with RVR in the 140s and was started on Cardizem infusion. Cardiology was consulted and input appreciated: Atrial flutter rate control is very challenging, especially during intercurrent illness. Very few options for rate control that will not worsen hypotension. Added digoxin load and daily, keep potassium close to 4. They recommend consulting EP for RF ablation if fails to respond to rate control medications. 6. 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 Resumed Bumex. He seems to have  predominantly right heart failure due to pulmonary hypertension. Sees Dr. Melvyn Novas, Pulmonology and Dr. Debara Pickett, cardiology. Weight down from 132 pounds on 8/24 > 122 pounds on 8/27. -7.7 L since admission. 7. DM 2: A1c 6.9 in July 2018. SSI. Holding metformin. Added Lantus 5 units daily. Now that his steroids have been switched to by mouth, expect to improve. CBGs better. Monitor closely. 8. Interstitial lung disease: Oral prednisone was switched to IV steroids> switched back to home PO Prednisone starting 8/26. No clinical bronchospasm. 9. Anemia: Stable 10. Transaminitis:? Hepatic congestion. Follow LFTs, slightly better.   DVT prophylaxis: Eliquis Code Status: Full Family Communication: None at bedside Disposition: DC home when medically improved. Not medically ready for discharge.   Consultants:  Cardiology  Procedures:  BiPAP  Antimicrobials:  Cefepime and vancomycin-discontinued.  Oral Augmentin 8/27 >   Subjective: Chronic cough and dyspnea, reports now at baseline. Overnight events noted, A. fib with RVR in the 140s. Denies palpitations, dizziness or lightheadedness.  ROS: No dizziness or lightheadedness reported.  Objective:  Vitals:   06/14/17 0402 06/14/17 0412 06/14/17 0733 06/14/17 1135  BP: 102/78  97/71 92/76  Pulse: (!) 108  (!) 103 86  Resp: 14  14 (!) 21  Temp:   97.7 F (36.5 C)   TempSrc:   Oral   SpO2: 92%  92% 93%  Weight:  55.7 kg (122 lb 11.2 oz)    Height:        Examination:  General exam: Pleasant elderly male, moderately built, frail and cachectic, chronically ill looking, lying comfortably propped up in bed. Respiratory system: Improving breath sounds in the bases with less/few coarse/chronic sounding basilar crackles. No wheezing or rhonchi. Rest of lung fields clear to auscultation. Respiratory effort normal. Stable. Cardiovascular system: S1 & S2 heard, RRR. No JVD, murmurs, rubs, gallops or clicks. 1+ pitting leg edema extending to upper  thigh. Telemetry: A. fib with RVR in the 140s overnight, was in 110s on IV Cardizem infusion started overnight. Gastrointestinal system: Abdomen is nondistended, soft and nontender. No organomegaly or masses felt. Normal bowel sounds heard. Stable. Central nervous system: Alert and oriented. No focal neurological deficits. Stable. Extremities: Symmetric 5 x 5 power. Skin: No rashes, lesions or ulcers Psychiatry: Judgement and insight appear normal. Mood & affect appropriate.     Data Reviewed: I have personally reviewed following labs and imaging studies  CBC:  Recent Labs Lab 06/10/17 2016 06/11/17 0446 06/12/17 0213  WBC 12.0* 10.6* 8.8  NEUTROABS 11.2* 10.0*  --   HGB 13.2 11.4* 11.2*  HCT 39.6 33.5* 34.3*  MCV 87.0 85.9 88.4  PLT 410* 296 504   Basic Metabolic Panel:  Recent Labs Lab 06/11/17 0848 06/11/17 1541 06/12/17 0213 06/13/17 0348 06/14/17 0525  NA 129* 130* 128* 131* 133*  K 3.9 3.6 4.1 3.6 3.0*  CL 90* 91* 91* 94* 93*  CO2 _0 32  GLUCOSE 131* 180* 161* 257* 80  BUN 49* 49* 51* 45* 28*  CREATININE 1.33* 1.23 1.18 1.02 0.75  CALCIUM 8.9 8.9 8.7* 8.3* 8.2*   Liver Function Tests:  Recent Labs Lab 06/10/17 2016 06/14/17 0525  AST 66* 49*  ALT 97* 54  ALKPHOS 134* 106  BILITOT 1.3* 1.1  PROT 7.2 5.1*  ALBUMIN 3.2* 2.5*   Coagulation Profile: No results for input(s): INR, PROTIME in the last 168 hours. Cardiac Enzymes: No results for input(s): CKTOTAL, CKMB, CKMBINDEX, TROPONINI  in the last 168 hours. HbA1C: No results for input(s): HGBA1C in the last 72 hours. CBG:  Recent Labs Lab 06/13/17 1151 06/13/17 1607 06/13/17 2128 06/14/17 0733 06/14/17 1136  GLUCAP 224* 188* 233* 70 184*    Recent Results (from the past 240 hour(s))  Culture, blood (routine x 2) Call MD if unable to obtain prior to antibiotics being given     Status: None (Preliminary result)   Collection Time: 06/11/17  8:55 AM  Result Value Ref Range Status    Specimen Description BLOOD LEFT ANTECUBITAL  Final   Special Requests   Final    BOTTLES DRAWN AEROBIC AND ANAEROBIC Blood Culture adequate volume   Culture NO GROWTH 3 DAYS  Final   Report Status PENDING  Incomplete  Culture, blood (routine x 2) Call MD if unable to obtain prior to antibiotics being given     Status: None (Preliminary result)   Collection Time: 06/11/17  9:04 AM  Result Value Ref Range Status   Specimen Description BLOOD LEFT WRIST  Final   Special Requests IN PEDIATRIC BOTTLE Blood Culture adequate volume  Final   Culture NO GROWTH 3 DAYS  Final   Report Status PENDING  Incomplete         Radiology Studies: No results found.      Scheduled Meds: . apixaban  5 mg Oral BID  . bumetanide  2 mg Oral BID  . calcium-vitamin D  1 tablet Oral BID  . dextromethorphan-guaiFENesin  1 tablet Oral BID  . digoxin  0.25 mg Oral Q2H  . [START ON 06/15/2017] digoxin  0.25 mg Oral Daily  . diltiazem  300 mg Oral Daily  . famotidine  20 mg Oral QHS  . feeding supplement (PRO-STAT SUGAR FREE 64)  30 mL Oral BID  . insulin aspart  0-5 Units Subcutaneous QHS  . insulin aspart  0-9 Units Subcutaneous TID WC  . insulin glargine  5 Units Subcutaneous Daily  . loratadine  10 mg Oral Daily  . mouth rinse  15 mL Mouth Rinse BID  . pantoprazole  40 mg Oral Daily  . [START ON 06/15/2017] potassium chloride SA  20 mEq Oral Daily  . predniSONE  20 mg Oral Q breakfast  . terazosin  2 mg Oral QHS   Continuous Infusions: . ceFEPime (MAXIPIME) IV Stopped (06/13/17 1811)  . diltiazem (CARDIZEM) infusion 10 mg/hr (06/14/17 1042)     LOS: 4 days     Narciso Stoutenburg, MD, FACP, FHM. [facility] Pager [phone]  If 7PM-7AM, please contact night-coverage [email or web address] Password [facility] 06/14/2017, 4:32 PM

## 2017-06-14 NOTE — Progress Notes (Signed)
Pt refusing CPAP at this time. Advised pt if he changes his mind to notify RN and RT will assist with machine.

## 2017-06-15 ENCOUNTER — Inpatient Hospital Stay (HOSPITAL_COMMUNITY): Payer: Medicare Other

## 2017-06-15 LAB — BASIC METABOLIC PANEL
ANION GAP: 9 (ref 5–15)
BUN: 25 mg/dL — ABNORMAL HIGH (ref 6–20)
CHLORIDE: 90 mmol/L — AB (ref 101–111)
CO2: 33 mmol/L — ABNORMAL HIGH (ref 22–32)
Calcium: 8.1 mg/dL — ABNORMAL LOW (ref 8.9–10.3)
Creatinine, Ser: 0.75 mg/dL (ref 0.61–1.24)
GFR calc Af Amer: >60 mL/min (ref >60)
GLUCOSE: 226 mg/dL — AB (ref 65–99)
POTASSIUM: 3.3 mmol/L — AB (ref 3.5–5.1)
SODIUM: 132 mmol/L — AB (ref 135–145)

## 2017-06-15 LAB — GLUCOSE, CAPILLARY
GLUCOSE-CAPILLARY: 174 mg/dL — AB (ref 65–99)
GLUCOSE-CAPILLARY: 258 mg/dL — AB (ref 65–99)
GLUCOSE-CAPILLARY: 296 mg/dL — AB (ref 65–99)
Glucose-Capillary: 255 mg/dL — ABNORMAL HIGH (ref 65–99)

## 2017-06-15 MED ORDER — METOPROLOL TARTRATE 5 MG/5ML IV SOLN
2.5000 mg | INTRAVENOUS | Status: DC | PRN
  Administered 2017-06-15 – 2017-06-16 (×2): 2.5 mg via INTRAVENOUS
  Filled 2017-06-15: qty 5

## 2017-06-15 MED ORDER — POTASSIUM CHLORIDE CRYS ER 20 MEQ PO TBCR: 40.0000 meq | EXTENDED_RELEASE_TABLET | Freq: Every day | ORAL | Status: DC

## 2017-06-15 MED ORDER — SALINE SPRAY 0.65 % NA SOLN
1.0000 | NASAL | Status: DC | PRN
  Filled 2017-06-15: qty 44

## 2017-06-15 MED ORDER — INSULIN GLARGINE 100 UNIT/ML ~~LOC~~ SOLN
7.0000 [IU] | Freq: Every day | SUBCUTANEOUS | Status: DC
  Filled 2017-06-15: qty 0.07

## 2017-06-15 MED ORDER — POTASSIUM CHLORIDE CRYS ER 20 MEQ PO TBCR
20.0000 meq | EXTENDED_RELEASE_TABLET | Freq: Once | ORAL | Status: AC
  Administered 2017-06-15: 20 meq via ORAL

## 2017-06-15 MED ORDER — POTASSIUM CHLORIDE CRYS ER 20 MEQ PO TBCR
40.0000 meq | EXTENDED_RELEASE_TABLET | Freq: Two times a day (BID) | ORAL | Status: AC
  Administered 2017-06-15 – 2017-06-16 (×2): 40 meq via ORAL
  Filled 2017-06-15 (×3): qty 2

## 2017-06-15 MED ORDER — DIGOXIN 0.25 MG/ML IJ SOLN
0.2500 mg | Freq: Once | INTRAMUSCULAR | Status: AC
  Administered 2017-06-15: 0.25 mg via INTRAVENOUS
  Filled 2017-06-15: qty 2

## 2017-06-15 MED ORDER — METOPROLOL TARTRATE 5 MG/5ML IV SOLN
INTRAVENOUS | Status: AC
  Filled 2017-06-15: qty 5

## 2017-06-15 NOTE — Progress Notes (Signed)
Inpatient Diabetes Program Recommendations  AACE/ADA: New Consensus Statement on Inpatient Glycemic Control (2015)  Target Ranges:  Prepandial:   less than 140 mg/dL      Peak postprandial:   less than 180 mg/dL (1-2 hours)      Critically ill patients:  140 - 180 mg/dL   Results for Gilbert Dominguez, Gilbert Dominguez (MRN [id number]) as of 06/15/2017 10:04  Ref. Range 06/14/2017 07:33 06/14/2017 11:36 06/14/2017 16:37 06/14/2017 21:21 06/15/2017 07:40  Glucose-Capillary Latest Ref Range: 65 - 99 mg/dL 70 184 (H) 213 (H) 202 (H) 174 (H)   Review of Glycemic Control  Diabetes history: DM2 Outpatient Diabetes medications: Metformin XR 1000 mg BID Current orders for Inpatient glycemic control: Lantus 5 units daily, Novolog 0-9 units TID with meals, Novolog 0-5 units QHS  Inpatient Diabetes Program Recommendations: Insulin - Meal Coverage: Post prandial glucose consistently elevated. If steroids are continued, please consider ordering Novolog 3 units TID with meals for meal coverage if patient eats at least 50% of meals.  Thanks, Barnie Alderman, RN, MSN, CDE Diabetes Coordinator Inpatient Diabetes Program [phone] (Team Pager from 8am to 5pm)

## 2017-06-15 NOTE — Progress Notes (Signed)
Per Dr Lindaann Pascal, cardiology fellow was paged 06/14/17 at 2000 to clarify order for PO diltiazem vs IV diltiazem.  No return call from cards fellow was received.  IV diltiazem running at 10 mL/hr.

## 2017-06-15 NOTE — Progress Notes (Signed)
Pt heart rate sustained 140s. Order parameters not met to titrate diltiazem gtt. [facility] on call consulted for rate control.  Per on-call, 250 mL NS bolus and 0.25 mg IV push digoxin given.

## 2017-06-15 NOTE — Plan of Care (Signed)
Problem: Health Behavior/Discharge Planning: Goal: Ability to manage health-related needs will improve Outcome: Not Progressing Requires moderate to total assist for patient care and ADLs

## 2017-06-15 NOTE — Plan of Care (Signed)
Problem: Physical Regulation: Goal: Ability to maintain clinical measurements within normal limits will improve Outcome: Not Progressing Pt HR continues to stay elevated despite medication. Cont to monitor  Problem: Activity: Goal: Risk for activity intolerance will decrease Outcome: Not Progressing Pt unable to ambulate d/t elevated HR and decreasing O2 sats with minimal exertion.

## 2017-06-15 NOTE — Progress Notes (Signed)
Progress Note  Patient Name: Gilbert Dominguez Date of Encounter: 06/15/2017  Primary Cardiologist: Dr. Debara Pickett EP: Dr. Caryl Comes   Subjective   No complaints. He reports that he is feeling better.   Inpatient Medications    Scheduled Meds: . amoxicillin-clavulanate  1 tablet Oral Q12H  . apixaban  5 mg Oral BID  . bumetanide  2 mg Oral BID  . calcium-vitamin D  1 tablet Oral BID  . dextromethorphan-guaiFENesin  1 tablet Oral BID  . digoxin  0.25 mg Oral Daily  . diltiazem  300 mg Oral Daily  . famotidine  20 mg Oral QHS  . feeding supplement (PRO-STAT SUGAR FREE 64)  30 mL Oral BID  . insulin aspart  0-5 Units Subcutaneous QHS  . insulin aspart  0-9 Units Subcutaneous TID WC  . insulin glargine  5 Units Subcutaneous Daily  . loratadine  10 mg Oral Daily  . mouth rinse  15 mL Mouth Rinse BID  . pantoprazole  40 mg Oral Daily  . potassium chloride SA  20 mEq Oral Daily  . predniSONE  20 mg Oral Q breakfast  . terazosin  2 mg Oral QHS   Continuous Infusions: . diltiazem (CARDIZEM) infusion 10 mg/hr (06/15/17 0220)   PRN Meds: acetaminophen, albuterol, HYDROcodone-acetaminophen, ondansetron (ZOFRAN) IV, sodium chloride   Vital Signs    Vitals:   06/15/17 0019 06/15/17 0602 06/15/17 0741 06/15/17 0838  BP: 101/73 100/74 99/69   Pulse: (!) 107 (!) 135 (!) 45 (!) 104  Resp: 17 (!) 25 16   Temp: 97.7 F (36.5 C) 98.1 F (36.7 C) 97.8 F (36.6 C)   TempSrc: Oral Oral Oral   SpO2: 92% 92% (!) 88%   Weight:  120 lb 14.4 oz (54.8 kg)    Height:        Intake/Output Summary (Last 24 hours) at 06/15/17 0840 Last data filed at 06/15/17 3785  Gross per 24 hour  Intake          1884.75 ml  Output             4175 ml  Net         -2290.25 ml   Filed Weights   06/13/17 0401 06/14/17 0412 06/15/17 0602  Weight: 126 lb 9.6 oz (57.4 kg) 122 lb 11.2 oz (55.7 kg) 120 lb 14.4 oz (54.8 kg)    Telemetry    Atrial fibrillation vs atrial flutter with varing ventricular  response with resting VRs in the 90. - Personally Reviewed  ECG    Atrial flutter - Personally Reviewed  Physical Exam   GEN: No acute distress.  Frail appearing  Neck: No JVD Cardiac: irregular rhythm, regular rate,  Respiratory: bibasilar crackles, R>L (underlying pulmonary fibrosis)  GI: Soft, nontender, non-distended  MS: trace LEE L>R; No deformity. Neuro:  Nonfocal  Psych: Normal affect   Labs    Chemistry Recent Labs Lab 06/10/17 2016  06/13/17 0348 06/14/17 0525 06/14/17 1625 06/15/17 0159  NA 118*  < > 131* 133*  --  132*  K 4.0  < > 3.6 3.0* 4.1 3.3*  CL 81*  < > 94* 93*  --  90*  CO2 19*  < > 26 32  --  33*  GLUCOSE 204*  < > 257* 80  --  226*  BUN 55*  < > 45* 28*  --  25*  CREATININE 1.65*  < > 1.02 0.75  --  0.75  CALCIUM 8.7*  < > 8.3*  8.2*  --  8.1*  PROT 7.2  --   --  5.1*  --   --   ALBUMIN 3.2*  --   --  2.5*  --   --   AST 66*  --   --  49*  --   --   ALT 97*  --   --  54  --   --   ALKPHOS 134*  --   --  106  --   --   BILITOT 1.3*  --   --  1.1  --   --   GFRNONAA 40*  < > >60 >60  --  >60  GFRAA 46*  < > >60 >60  --  >60  ANIONGAP 18*  < > 11 8  --  9  < > = values in this interval not displayed.   Hematology Recent Labs Lab 06/10/17 2016 06/11/17 0446 06/12/17 0213  WBC 12.0* 10.6* 8.8  RBC 4.55 3.90* 3.88*  HGB 13.2 11.4* 11.2*  HCT 39.6 33.5* 34.3*  MCV 87.0 85.9 88.4  MCH 29.0 29.2 28.9  MCHC 33.3 34.0 32.7  RDW 16.7* 16.5* 16.8*  PLT 410* 296 286    Cardiac EnzymesNo results for input(s): TROPONINI in the last 168 hours.  Recent Labs Lab 06/10/17 2025  TROPIPOC 0.05     BNP Recent Labs Lab 06/10/17 2016  BNP 1,928.5*     DDimer No results for input(s): DDIMER in the last 168 hours.   Radiology    No results found.  Cardiac Studies   2D Echo 05/01/17 Study Conclusions  - Left ventricle: The cavity size was mildly reduced. Systolic   function was normal. The estimated ejection fraction was in the    range of 55% to 60%. Wall motion was normal; there were no   regional wall motion abnormalities. Doppler parameters are   consistent with abnormal left ventricular relaxation (grade 1   diastolic dysfunction). - Ventricular septum: The contour showed diastolic flattening and   systolic flattening. These changes are consistent with RV volume   and pressure overload. - Aortic valve: There was very mild stenosis. Valve area (VTI):   1.85 cm^2. Valve area (Vmax): 1.61 cm^2. Valve area (Vmean): 1.5   cm^2. - Right ventricle: The cavity size was severely dilated. Systolic   function was moderately to severely reduced. - Right atrium: The atrium was severely dilated. - Tricuspid valve: There was moderate-severe regurgitation directed   eccentrically. - Pulmonary arteries: Systolic pressure was severely increased. PA   peak pressure: 87 mm Hg (S).  Left atrium:  The atrium was normal in size. Right atrium:  The atrium was severely dilated.   Sleep Study- completed, results pending   Patient Profile     Gilbert Dominguez is a 71 y.o. male with a hx of pAflutter (on Eliquis - CHA2DS2 VASc score of 4), chronic diastolic CHF, chronic respiratory failure (on 4L [place]), pulmonary fibrosis, pulmonary HTN, Stage 3 CKD, and Type 2 DM, whom cardiology was consulted to see for the evaluation of atrial flutter with RVR at the request of Dr. Algis Liming, Internal Medicine .  Assessment & Plan    1. Atrial Flutter: his atrial flutter has been difficult to rate control. In addition he has been intolerant to high doses of rate control agents given soft BPs. Cardizem dose recently reduced for this reason. Beta blockers also not ideal given his chronic pulmonary issues. Digoxin added yesterday. Resting HR in the 90s. BP remains soft  in the upper 00T systolic. May need to consider aflutter ablation. Keep K WNL. Supplemental K-dur was given today. Sleep study was completed. Results pending. Continue Eliquis for a/c  given CHA2DS2 VASc score of 4.   2. Chronic Right Heart Failure, Secondary to Pulmonary HTN: recent echocardiogram 04/2017 with moderately to severely reduced RV systolic function and severely increased PA systolic pressure at 87 mm Hg. He is followed by Dr. Melvyn Novas, pulmonologist. Weight is stable on Bumex. I/Os negative 8L today. Continue to monitor daily weights and I/Os. Sleep study completed. Results pending.   3. Acute on Chronic Respiratory Failure/ ILD: secondary to underlying pulmonary fibrosis and chronic diastolic HF, complicated by HCAP. Usually on 4L/min [place] at home. Currently on 6L.   4. HCAP: Treated empirically with vancomycin and cefepime. He is afebrile. Urine and blood cultures both negative. Management per IM.  5. AKI: resolved. SCr was 1.36 earlier this admission. Now WNL at 0.75.   6. Hypokalemia: K remains low but improved from yesterday, up from 3.0>>3.3. Needs additional supplementation. K-dur, 20 mEq, given today. Continue to monitor.    Signed, Lyda Jester, PA-C  06/15/2017, 8:40 AM    I have seen and examined the patient along with Lyda Jester, PA-C.  I have reviewed the chart, notes and new data.  I agree with PA's note.  Key new complaints: weak, dyspnea at baseline Key examination changes: dry crackles bilaterally, improved rate control Key new findings / data: K 3.3  PLAN: Aggressively replace K due to risk of dig toxicity. DC IV diltiazem. Check dig level after 3-4 days on oral digoxin.  Sanda Klein, MD, [place] [phone] 06/15/2017, 9:31 AM

## 2017-06-15 NOTE — Progress Notes (Signed)
HR sustained in 140s. EKG obtained and MD notified. BP 108/80 (90). Metoprolol 2.12m IV push given per MD. Rate now controlled in 90s. BP stable. Will continue to monitor.

## 2017-06-15 NOTE — Progress Notes (Signed)
Physical Therapy Treatment Patient Details Name: Esias Mory MRN: [id number] DOB: 01-21-1946 Today's Date: 06/15/2017    History of Present Illness Pt is a 71 y/o male admitted secondary to HCAP. PMH includes pulmonary fibrosis on 4L of oxygen, pulmonary HTN, RA, CHF, and DM.     PT Comments    Patient's session limited today secondary to tachycardia, hypotension, dizziness, drop in Sp02 and increased RR with activity. Pt tolerated marching in place and side stepping along side bed with min guard assist. Fatigues very quickly with minimal activity. Sp02 dropped to 70% on 6L/min 02 and takes a long time to recover with each rest break. HR up to 135 bpm during session.  RR up to 40.  Supine BP 114/81 Sitting BP 97/59 Post standing BP 103/82 Pt reports feeling much better having worked with therapy but required encouragement initially. Encouraged OOB to chair as much as tolerated. Will continue to follow and progress as tolerated.   Follow Up Recommendations  Home health PT;Supervision for mobility/OOB     Equipment Recommendations  None recommended by PT    Recommendations for Other Services       Precautions / Restrictions Precautions Precautions: Fall Precaution Comments: watch HR and 02 Restrictions Weight Bearing Restrictions: No    Mobility  Bed Mobility Overal bed mobility: Needs Assistance Bed Mobility: Supine to Sit;Sit to Supine     Supine to sit: Supervision;HOB elevated Sit to supine: Supervision;HOB elevated   General bed mobility comments: Supervision for safety and line management. No assist needed. + dizziness sitting EOB.  Transfers Overall transfer level: Needs assistance Equipment used: Rolling walker (2 wheeled) Transfers: Sit to/from Stand Sit to Stand: Min guard         General transfer comment: Min guard for safety. Increased effort and time to stand x2. Cues for hand placement.   Ambulation/Gait Ambulation/Gait assistance: Min  guard Ambulation Distance (Feet): 3 Feet Assistive device: Rolling walker (2 wheeled) Gait Pattern/deviations: Trunk flexed;Step-to pattern Gait velocity: decreased   General Gait Details: Able to side step along side HOB but limited today secondary to tachycardia and drop in Sp02. HR up to 135 bpm; Sp02 in 70s on 6L/min 02.   Stairs            Wheelchair Mobility    Modified Rankin (Stroke Patients Only)       Balance Overall balance assessment: Needs assistance Sitting-balance support: Feet supported;No upper extremity supported Sitting balance-Leahy Scale: Good     Standing balance support: During functional activity;Bilateral upper extremity supported Standing balance-Leahy Scale: Poor Standing balance comment: Reliant on UEs for support in standing.                             Cognition Arousal/Alertness: Awake/alert Behavior During Therapy: WFL for tasks assessed/performed Overall Cognitive Status: Within Functional Limits for tasks assessed                                        Exercises Other Exercises Other Exercises: marching in place ~15 sec with RW.    General Comments General comments (skin integrity, edema, etc.): Reports numbness in bil feet. Supine BP 114/81, sitting BP 97/59, post standing BP 103/82      Pertinent Vitals/Pain Pain Assessment: No/denies pain    Home Living  Prior Function            PT Goals (current goals can now be found in the care plan section) Progress towards PT goals: Not progressing toward goals - comment (secondary to SP02/HR)    Frequency    Min 3X/week      PT Plan Current plan remains appropriate    Co-evaluation              AM-PAC PT "6 Clicks" Daily Activity  Outcome Measure  Difficulty turning over in bed (including adjusting bedclothes, sheets and blankets)?: None Difficulty moving from lying on back to sitting on the side of the  bed? : None Difficulty sitting down on and standing up from a chair with arms (e.g., wheelchair, bedside commode, etc,.)?: Unable Help needed moving to and from a bed to chair (including a wheelchair)?: A Little Help needed walking in hospital room?: A Little Help needed climbing 3-5 steps with a railing? : A Lot 6 Click Score: 17    End of Session Equipment Utilized During Treatment: Oxygen Activity Tolerance: Treatment limited secondary to medical complications (Comment) (decreased SP02 and elevated HR) Patient left: in bed;with call bell/phone within reach;with bed alarm set Nurse Communication: Mobility status PT Visit Diagnosis: Muscle weakness (generalized) (M62.81);Other (comment) (DOE)     Time: 3532-9924 PT Time Calculation (min) (ACUTE ONLY): 25 min  Charges:  $Therapeutic Exercise: 8-22 mins $Therapeutic Activity: 8-22 mins                    G Codes:       Wray Kearns, PT, DPT [phone]     Marguarite Arbour A Sabra Heck 06/15/2017, 3:39 PM

## 2017-06-15 NOTE — Progress Notes (Signed)
PROGRESS NOTE   Gilbert Dominguez  OZH:[id number]    DOB: 04-02-1946    DOA: 06/10/2017  PCP: Leonard Downing, MD   I have briefly reviewed patients previous medical [facility] in [facility].  Brief Narrative:  71 year old male with PMH of pulmonary fibrosis, chronic hypoxic respiratory failure on home oxygen 4 L/m, ongoing tobacco abuse, chronic diastolic CHF, atrial flutter on Eliquis and HTN presented to the ED with worsening dyspnea over last couple of days, no significant change in his chronic cough, some worsening nausea without vomiting. Chronic leg edema. In ED, hypoxic in the 80s on 4 L oxygen, blood pressure 90/70, chest x-ray concerning for pneumonia superimposed on chronic lung disease, sodium 118 down from 134 week ago, creatinine 1.65. Admitted to step down for evaluation and management of acute on chronic hypoxic respiratory failure suspected secondary to healthcare associated pneumonia and for hyponatremia.Patient was clinically improving. However on night of 8/26, went into A. fib with RVR in the 140s (in the context of home Cardizem dose being marginally reduced due to soft blood pressures). Cardiology consulted on 8/27.   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 Healthcare associated pneumonia: Treated empirically with vancomycin and cefepime. Urine pneumococcal antigen negative. Urine Legionella antigen: Negative, blood cultures: Negative to date. HIV screen nonreactive. Improving. Since cultures negative MRSA PCR negative, DC vancomycin.  Transitioned to oral Augmentin on 8/27--CXR 1 vw under penetrated--monitor clinically and hope for recovery low threshold to change back to IV 2. Acute on chronic hypoxic respiratory failure: Secondary to HCAP complicating underlying pulmonary fibrosis and chronic CHF. Briefly on BiPAP overnight in the ED. Reports breathing back to baseline. Currently saturating in the hi80-90 on 4 L/m oxygen, wean as tolerated to maintain saturations between 88-92 percent. Oxygenation likely at baseline now. Very dyspneoic 3. Hyponatremia: Sodium 118 on admission, down from 134 on 8/16. Urine osmolarity 189, urine sodium 11. Likely multifactorial from poor oral intake, some intravascular volume depletion. Received NS 500 mL 1 in ED. Sodium up to 127 > 129 >130 >128 >131 >133. No further IV fluids. Follow BMP in a.m. Now mostly due to hypervolemic hypo-natremia. Resumed home dose of Bumex. Sodium and fluid restriction. 4. Acute kidney injury: Presented with serum creatinine of 1.65, 0.88 on 8/16. Does have significant third spacing despite mild intravascular volume depletion. Brief IV fluids completed. Creatinine normalized. Renal ultrasound negative for hydronephrosis. Monitor BMP while on Bumex. Stable. 5. Atrial flutter with RVR: Rate controlled. CHADS-VASc is 39 (age, CHF, HTN, DM). Continue Eliquis and diltiazem. Reduced Cardizem from 360 to 300 MG daily due to soft blood pressures. t 8/26, went into A. Fib/flutter with RVR in the 140s and was started on Cardizem infusion. Cardiology was consulted and input appreciated: Atrial flutter rate control is very challenging, especially during intercurrent illness. Very few options for rate control that will not worsen hypotension. Added digoxin load and daily, keep potassium close to 4. ? EP for RF ablation if fails to respond to rate control medications. 6. 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 Resumed Bumex. He seems to have predominantly right heart failure due to pulmonary hypertension. . Weight down from 132 pounds on 8/24 > 122 pounds on 8/27. -9.6 L since admission. 7. DM 2: A1c 6.9 in July 2018. SSI. Holding metformin. Added Lantus 5 units daily. Now that his steroids have been switched to by mouth, expect to improve. CBGs 258-296--increasing 8/29 to 7 U lantus 8. Interstitial lung disease: Oral prednisone was switched to IV steroids> switched back to home PO Prednisone starting 8/26. No clinical bronchospasm. 9. Anemia: Stable 10. Transaminitis:? Hepatic congestion. Follow LFTs, slightly better.   DVT prophylaxis: Eliquis Code Status: Full Family Communication: None at bedside Disposition: DC home when medically improved. Not medically ready for discharge.   Consultants:  Cardiology  Procedures:  BiPAP  Antimicrobials:  Cefepime and vancomycin-discontinued.  Oral Augmentin 8/27 >   Subjective: Hr still 130's Looks like he is dyspnoies but talign dfull sentences  No fever no chills Overall looks ill  Objective:  Vitals:   06/15/17 0741 06/15/17 0838 06/15/17 1111 06/15/17 1725  BP: 99/69  106/76 103/82  Pulse: (!) 45 (!) 104 (!) 131 81  Resp: _0 Temp: 97.8 F (36.6 C)  98.1 F (36.7 C) 98.4 F (36.9 C)  TempSrc: Oral  Oral Oral  SpO2: (!) 88%  90% 94%  Weight:      Height:        Examination:  Cachectic, slight WOB Crackles over lung fields post s1 s 2tachy-hsm-->axilla abd soft nt nd no rebound Trace le edema Neuro intact, psych intact   Data Reviewed: I have personally reviewed following labs and imaging studies  CBC:  Recent Labs Lab 06/10/17 2016 06/11/17 0446 06/12/17 0213  WBC 12.0* 10.6* 8.8  NEUTROABS 11.2* 10.0*  --   HGB 13.2 11.4* 11.2*  HCT 39.6 33.5* 34.3*  MCV 87.0 85.9 88.4  PLT 410* 296 031   Basic Metabolic Panel:  Recent Labs Lab 06/11/17 1541 06/12/17 0213 06/13/17 0348  06/14/17 0525 06/14/17 1625 06/15/17 0159  NA 130* 128* 131* 133*  --  132*  K 3.6 4.1 3.6 3.0* 4.1 3.3*  CL 91* 91* 94* 93*  --  90*  CO2 _1 32  --  33*  GLUCOSE 180* 161* 257* 80  --  226*  BUN 49* 51* 45* 28*  --  25*  CREATININE 1.23 1.18 1.02 0.75  --  0.75  CALCIUM 8.9 8.7* 8.3* 8.2*  --  8.1*   Liver Function Tests:  Recent Labs Lab 06/10/17 2016 06/14/17 0525  AST 66* 49*  ALT 97* 54  ALKPHOS 134* 106  BILITOT 1.3* 1.1  PROT 7.2 5.1*  ALBUMIN 3.2* 2.5*   Coagulation Profile: No results for input(s): INR, PROTIME in the last 168 hours. Cardiac Enzymes: No results for input(s): CKTOTAL, CKMB, CKMBINDEX, TROPONINI in the last 168 hours. HbA1C: No results for input(s): HGBA1C in the last 72 hours. CBG:  Recent Labs Lab 06/14/17 1637 06/14/17 2121 06/15/17 0740 06/15/17 1110 06/15/17 1643  GLUCAP 213* 202* 174* 258* 296*    Recent Results (from the past 240 hour(s))  Culture, blood (routine x 2) Call MD if unable to obtain prior to antibiotics being given     Status: None (Preliminary result)   Collection Time: 06/11/17  8:55 AM  Result Value Ref Range Status   Specimen Description BLOOD LEFT ANTECUBITAL  Final  Special Requests   Final    BOTTLES DRAWN AEROBIC AND ANAEROBIC Blood Culture adequate volume   Culture NO GROWTH 4 DAYS  Final   Report Status PENDING  Incomplete  Culture, blood (routine x 2) Call MD if unable to obtain prior to antibiotics being given     Status: None (Preliminary result)   Collection Time: 06/11/17  9:04 AM  Result Value Ref Range Status   Specimen Description BLOOD LEFT WRIST  Final   Special Requests IN PEDIATRIC BOTTLE Blood Culture adequate volume  Final   Culture NO GROWTH 4 DAYS  Final   Report Status PENDING  Incomplete         Radiology Studies: Dg Chest Port 1 View  Result Date: 06/15/2017 CLINICAL DATA:  Pneumonia.  Shortness of breath EXAM: PORTABLE CHEST 1 VIEW COMPARISON:  Five days ago FINDINGS:  Lower lung volumes with progressed bilateral opacity. Haziness at the right base, possible small effusion. Chronic cardiomegaly. No pneumothorax. IMPRESSION: History of pneumonia with lower lung volumes and increased opacity compared to 5 days ago. Electronically Signed   By: Monte Fantasia M.D.   On: 06/15/2017 14:36        Scheduled Meds: . amoxicillin-clavulanate  1 tablet Oral Q12H  . apixaban  5 mg Oral BID  . bumetanide  2 mg Oral BID  . calcium-vitamin D  1 tablet Oral BID  . dextromethorphan-guaiFENesin  1 tablet Oral BID  . digoxin  0.25 mg Oral Daily  . diltiazem  300 mg Oral Daily  . famotidine  20 mg Oral QHS  . feeding supplement (PRO-STAT SUGAR FREE 64)  30 mL Oral BID  . insulin aspart  0-5 Units Subcutaneous QHS  . insulin aspart  0-9 Units Subcutaneous TID WC  . insulin glargine  5 Units Subcutaneous Daily  . loratadine  10 mg Oral Daily  . mouth rinse  15 mL Mouth Rinse BID  . pantoprazole  40 mg Oral Daily  . potassium chloride SA  40 mEq Oral BID  . predniSONE  20 mg Oral Q breakfast  . terazosin  2 mg Oral QHS   Continuous Infusions:    LOS: 5 days     Nita Sells, MD, FACP, FHM. [facility] Pager [phone]  If 7PM-7AM, please contact night-coverage [email or web address] Password [facility] 06/15/2017, 5:53 PM

## 2017-06-15 NOTE — Plan of Care (Signed)
Problem: Safety: Goal: Ability to remain free from injury will improve Outcome: Progressing Utilizes call light to call for assistance. Proper hygiene and skin care implemented

## 2017-06-16 ENCOUNTER — Ambulatory Visit: Payer: Medicare Other | Admitting: Internal Medicine

## 2017-06-16 DIAGNOSIS — I4892 Unspecified atrial flutter: Secondary | ICD-10-CM

## 2017-06-16 DIAGNOSIS — J9620 Acute and chronic respiratory failure, unspecified whether with hypoxia or hypercapnia: Secondary | ICD-10-CM

## 2017-06-16 LAB — CBC WITH DIFFERENTIAL/PLATELET
Basophils Absolute: 0 10*3/uL (ref 0.0–0.1)
Basophils Relative: 0 %
Eosinophils Absolute: 0.1 10*3/uL (ref 0.0–0.7)
Eosinophils Relative: 1 %
HEMATOCRIT: 37 % — AB (ref 39.0–52.0)
HEMOGLOBIN: 11.6 g/dL — AB (ref 13.0–17.0)
Lymphocytes Relative: 8 %
Lymphs Abs: 1.1 10*3/uL (ref 0.7–4.0)
MCH: 28.7 pg (ref 26.0–34.0)
MCHC: 31.4 g/dL (ref 30.0–36.0)
MCV: 91.6 fL (ref 78.0–100.0)
MONO ABS: 0.5 10*3/uL (ref 0.1–1.0)
MONOS PCT: 4 %
NEUTROS ABS: 11.7 10*3/uL — AB (ref 1.7–7.7)
NEUTROS PCT: 87 %
Platelets: 205 10*3/uL (ref 150–400)
RBC: 4.04 MIL/uL — ABNORMAL LOW (ref 4.22–5.81)
RDW: 16.9 % — AB (ref 11.5–15.5)
WBC: 13.4 10*3/uL — ABNORMAL HIGH (ref 4.0–10.5)

## 2017-06-16 LAB — BASIC METABOLIC PANEL
ANION GAP: 7 (ref 5–15)
BUN: 22 mg/dL — ABNORMAL HIGH (ref 6–20)
CHLORIDE: 93 mmol/L — AB (ref 101–111)
CO2: 36 mmol/L — AB (ref 22–32)
Calcium: 8.5 mg/dL — ABNORMAL LOW (ref 8.9–10.3)
Creatinine, Ser: 0.75 mg/dL (ref 0.61–1.24)
GFR calc non Af Amer: >60 mL/min (ref >60)
GLUCOSE: 195 mg/dL — AB (ref 65–99)
Potassium: 3.9 mmol/L (ref 3.5–5.1)
Sodium: 136 mmol/L (ref 135–145)

## 2017-06-16 LAB — CULTURE, BLOOD (ROUTINE X 2)
CULTURE: NO GROWTH
Culture: NO GROWTH
SPECIAL REQUESTS: ADEQUATE
SPECIAL REQUESTS: ADEQUATE

## 2017-06-16 LAB — MAGNESIUM: Magnesium: 1.5 mg/dL — ABNORMAL LOW (ref 1.7–2.4)

## 2017-06-16 LAB — GLUCOSE, CAPILLARY
GLUCOSE-CAPILLARY: 235 mg/dL — AB (ref 65–99)
GLUCOSE-CAPILLARY: 62 mg/dL — AB (ref 65–99)
Glucose-Capillary: 263 mg/dL — ABNORMAL HIGH (ref 65–99)
Glucose-Capillary: 137 mg/dL — ABNORMAL HIGH (ref 65–99)

## 2017-06-16 LAB — TROPONIN I
TROPONIN I: 0.1 ng/mL — AB (ref <0.03)
Troponin I: 0.1 ng/mL (ref <0.03)
Troponin I: 0.09 ng/mL (ref <0.03)

## 2017-06-16 MED ORDER — DILTIAZEM HCL ER COATED BEADS 180 MG PO CP24
360.0000 mg | ORAL_CAPSULE | Freq: Every day | ORAL | Status: DC
  Administered 2017-06-17 – 2017-06-19 (×3): 360 mg via ORAL
  Filled 2017-06-16 (×3): qty 2

## 2017-06-16 MED ORDER — SODIUM CHLORIDE 0.9 % IV BOLUS (SEPSIS)
250.0000 mL | Freq: Once | INTRAVENOUS | Status: AC
  Administered 2017-06-16: 250 mL via INTRAVENOUS

## 2017-06-16 MED ORDER — PIPERACILLIN-TAZOBACTAM 3.375 G IVPB 30 MIN
3.3750 g | Freq: Once | INTRAVENOUS | Status: AC
  Administered 2017-06-16: 3.375 g via INTRAVENOUS
  Filled 2017-06-16: qty 50

## 2017-06-16 MED ORDER — INSULIN GLARGINE 100 UNIT/ML ~~LOC~~ SOLN
10.0000 [IU] | Freq: Every day | SUBCUTANEOUS | Status: DC
  Administered 2017-06-16 – 2017-06-17 (×2): 10 [IU] via SUBCUTANEOUS
  Filled 2017-06-16 (×2): qty 0.1

## 2017-06-16 MED ORDER — PIPERACILLIN-TAZOBACTAM 3.375 G IVPB
3.3750 g | Freq: Three times a day (TID) | INTRAVENOUS | Status: DC
  Administered 2017-06-16 – 2017-06-18 (×6): 3.375 g via INTRAVENOUS
  Filled 2017-06-16 (×7): qty 50

## 2017-06-16 NOTE — Progress Notes (Signed)
Progress Note  Patient Name: Gilbert Dominguez Date of Encounter: 06/16/2017  Primary Cardiologist: Gilbert Dominguez EP: Gilbert Dominguez    Subjective   Feels better. No chest pain, SOB or palpitation however rate in 140s.   Inpatient Medications    Scheduled Meds: . apixaban  5 mg Oral BID  . bumetanide  2 mg Oral BID  . calcium-vitamin D  1 tablet Oral BID  . dextromethorphan-guaiFENesin  1 tablet Oral BID  . digoxin  0.25 mg Oral Daily  . diltiazem  300 mg Oral Daily  . famotidine  20 mg Oral QHS  . feeding supplement (PRO-STAT SUGAR FREE 64)  30 mL Oral BID  . insulin aspart  0-5 Units Subcutaneous QHS  . insulin aspart  0-9 Units Subcutaneous TID WC  . insulin glargine  10 Units Subcutaneous Daily  . loratadine  10 mg Oral Daily  . mouth rinse  15 mL Mouth Rinse BID  . pantoprazole  40 mg Oral Daily  . predniSONE  20 mg Oral Q breakfast  . terazosin  2 mg Oral QHS   Continuous Infusions: . piperacillin-tazobactam     PRN Meds: acetaminophen, albuterol, HYDROcodone-acetaminophen, metoprolol tartrate, ondansetron (ZOFRAN) IV, sodium chloride   Vital Signs    Vitals:   06/16/17 0141 06/16/17 0502 06/16/17 0900 06/16/17 0902  BP: 103/71 96/78  93/80  Pulse: 68 98 (!) 116   Resp: 20 19    Temp:  97.6 F (36.4 C)    TempSrc:  Oral    SpO2: 90% 95%    Weight:  114 lb 3.2 oz (51.8 kg)    Height:        Intake/Output Summary (Last 24 hours) at 06/16/17 0938 Last data filed at 06/16/17 0837  Gross per 24 hour  Intake              840 ml  Output             5250 ml  Net            -4410 ml   Filed Weights   06/14/17 0412 06/15/17 0602 06/16/17 0502  Weight: 122 lb 11.2 oz (55.7 kg) 120 lb 14.4 oz (54.8 kg) 114 lb 3.2 oz (51.8 kg)    Telemetry    Atrial flutter rate in 90-140s - Personally Reviewed  ECG  None today   Physical Exam   GEN: No acute distress.   Neck: No JVD Cardiac: regular tachycardic , no murmurs, rubs, or gallops.  Respiratory: bibasilar  crackles on nasal cannula  GI: Soft, nontender, non-distended  MS: No edema; No deformity. Neuro:  Nonfocal  Psych: Normal affect   Labs    Chemistry Recent Labs Lab 06/10/17 2016  06/14/17 0525 06/14/17 1625 06/15/17 0159 06/16/17 0131  NA 118*  < > 133*  --  132* 136  K 4.0  < > 3.0* 4.1 3.3* 3.9  CL 81*  < > 93*  --  90* 93*  CO2 19*  < > 32  --  33* 36*  GLUCOSE 204*  < > 80  --  226* 195*  BUN 55*  < > 28*  --  25* 22*  CREATININE 1.65*  < > 0.75  --  0.75 0.75  CALCIUM 8.7*  < > 8.2*  --  8.1* 8.5*  PROT 7.2  --  5.1*  --   --   --   ALBUMIN 3.2*  --  2.5*  --   --   --  AST 66*  --  49*  --   --   --   ALT 97*  --  54  --   --   --   ALKPHOS 134*  --  106  --   --   --   BILITOT 1.3*  --  1.1  --   --   --   GFRNONAA 40*  < > >60  --  >60 >60  GFRAA 46*  < > >60  --  >60 >60  ANIONGAP 18*  < > 8  --  9 7  < > = values in this interval not displayed.   Hematology Recent Labs Lab 06/11/17 0446 06/12/17 0213 06/16/17 0131  WBC 10.6* 8.8 13.4*  RBC 3.90* 3.88* 4.04*  HGB 11.4* 11.2* 11.6*  HCT 33.5* 34.3* 37.0*  MCV 85.9 88.4 91.6  MCH 29.2 28.9 28.7  MCHC 34.0 32.7 31.4  RDW 16.5* 16.8* 16.9*  PLT 296 286 205    Cardiac Enzymes Recent Labs Lab 06/16/17 0131 06/16/17 0635  TROPONINI 0.10* 0.10*    Recent Labs Lab 06/10/17 2025  TROPIPOC 0.05     BNP Recent Labs Lab 06/10/17 2016  BNP 1,928.5*     DDimer No results for input(s): DDIMER in the last 168 hours.   Radiology    Dg Chest Port 1 View  Result Date: 06/15/2017 CLINICAL DATA:  Pneumonia.  Shortness of breath EXAM: PORTABLE CHEST 1 VIEW COMPARISON:  Five days ago FINDINGS: Lower lung volumes with progressed bilateral opacity. Haziness at the right base, possible small effusion. Chronic cardiomegaly. No pneumothorax. IMPRESSION: History of pneumonia with lower lung volumes and increased opacity compared to 5 days ago. Electronically Signed   By: Gilbert Dominguez M.D.   On:  06/15/2017 14:36    Cardiac Studies   2D Echo 05/01/17 Study Conclusions  - Left ventricle: The cavity size was mildly reduced. Systolic function was normal. The estimated ejection fraction was in the range of 55% to 60%. Wall motion was normal; there were no regional wall motion abnormalities. Doppler parameters are consistent with abnormal left ventricular relaxation (grade 1 diastolic dysfunction). - Ventricular septum: The contour showed diastolic flattening and systolic flattening. These changes are consistent with RV volume and pressure overload. - Aortic valve: There was very mild stenosis. Valve area (VTI): 1.85 cm^2. Valve area (Vmax): 1.61 cm^2. Valve area (Vmean): 1.5 cm^2. - Right ventricle: The cavity size was severely dilated. Systolic function was moderately to severely reduced. - Right atrium: The atrium was severely dilated. - Tricuspid valve: There was moderate-severe regurgitation directed eccentrically. - Pulmonary arteries: Systolic pressure was severely increased. PA peak pressure: 87 mm Hg (S).  Left atrium: The atrium was normal in size. Right atrium: The atrium was severely dilated.   Sleep Study- completed, results pending   Patient Profile     Gilbert Dominguez a 71 y.o.malewith a hx of pAflutter (on Eliquis - CHA2DS2 VASc score of 4), chronic diastolic CHF, chronic respiratory failure (on 4L [place]), pulmonary fibrosis, pulmonary HTN, Stage 3 CKD, and Type 2 DM,whom cardiology was consulted to see for the evaluation of atrial flutter with RVRat the request of Gilbert Dominguez, Internal Medicine .  Assessment & Plan    1. Atrial Flutter with RVR - Difficult to control  - Cardizem gtt discontinued due to soft BP. Currently on Cardizem CD and digoxin. Rate still elevated at times to 140s. Given IV metopolol 2.37m x 1 last night, however continued to have intermittent rate  elevated. Discussed trial of CPAP (refused last  night). Currently soft low BP with rate in 140s. Will get EP evaluation. Check Digoxin level Friday. Continue Eliquis for anticoagulation.   2. Hypokalemia - Resolved with supplementation  3. Chronic R sided heart failure 2nd to pHTN - recent echo as above. Pending sleep study result.   4. HCAP - Per primary   Signed, Bhagat,Bhavinkumar, PA  06/16/2017, 9:38 AM    I have seen and examined the patient along with Bhagat,Bhavinkumar, PA .  I have reviewed the chart, notes and new data.  I agree with PA/NP's note.  PLAN: Very few options for rate control. Will get opinion from EP re: RF ablation for RA flutter. May have to consider comfort care.  Sanda Klein, MD, [place] [phone] 06/16/2017, 10:37 AM

## 2017-06-16 NOTE — Consult Note (Signed)
Cardiology Consultation:   Patient ID: Gilbert Dominguez; [id number]; 14-Jul-1946   Admit date: 06/10/2017 Date of Consult: 06/16/2017  Primary Care Provider: Leonard Downing, MD Primary Cardiologist: Dr. Debara Pickett Primary Electrophysiologist:  Dr. Caryl Comes   Patient Profile:   Gilbert Dominguez is a 71 y.o. 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 Sallyanne Kuster.  History of Present Illness:   Gilbert Dominguez was admitted to [facility] 06/10/17 (3rd this month) with recurrent respiratory failure Hospital Acquired Pneumonia.  On arrival his HR was well controlled on Ditiazem 363m, this was reduced for some low BP readings.  Unfortunately HR trended upwards and despite add on digoxin remains uncontrolled.  The patient is unaware if his tachycardia.  Today he states he feels likehis breathing is much improved, (noted on FM O2, in d/w RN had transient desaturation, was placed back on n/c with stable saturations and resp status after we left).  Most recent LABS K+ 3.9 BUN/Creat 22/0.75 WBC 13.4 H/H 11.6/37.0 plts 205 Trop I 0.10 x2   Past Medical History:  Diagnosis Date  . Atrial flutter ([place]    a. initially diagnosed in 04/2017, started on Eliquis b. recurrent in 05/2017  . CHF exacerbation ([facility] [place] 02/24/2017  . Chronic respiratory failure ([place]   . Hypertension   . Pulmonary fibrosis, postinflammatory ([place]   . Rheumatoid arthritis(714.0)     Past Surgical History:  Procedure Laterality Date  . ACNE CYST REMOVAL  1968  . VASECTOMY  1973     Home Medications:  Prior to Admission medications   Medication Sig Start Date End Date Taking? Authorizing Provider  acetaminophen  (TYLENOL) 500 MG tablet Take 1,000 mg by mouth every 6 (six) hours as needed for mild pain.   Yes [provider]  Amino Acids-Protein Hydrolys (FEEDING SUPPLEMENT, PRO-STAT SUGAR FREE 64,) LIQD Take 30 mLs by mouth 2 (two) times daily. 05/06/17  Yes Johnson, Clanford L, MD  apixaban (ELIQUIS) 5 MG TABS tablet Take 1 tablet (5 mg total) by mouth 2 (two) times daily. 05/06/17  Yes Johnson, Clanford L, MD  bumetanide (BUMEX) 2 MG tablet Take 1 tablet (2 mg total) by mouth 2 (two) times daily. 05/06/17  Yes Johnson, Clanford L, MD  Calcium Carbonate-Vit D-Min (CALCIUM 600+D PLUS MINERALS PO) Take 1 tablet by mouth 2 (two) times daily.   Yes [provider]  diltiazem (CARDIZEM CD) 360 MG 24 hr capsule Take 1 capsule (360 mg total) by mouth daily. 06/07/17  Yes Hilty, KNadean Corwin MD  famotidine (PEPCID) 20 MG tablet Take 1 tablet (20 mg total) by mouth at bedtime. 01/27/17  Yes WTanda Rockers MD  loratadine (CLARITIN) 10 MG tablet Take 10 mg by mouth daily.   Yes [provider]  metFORMIN (GLUCOPHAGE XR) 500 MG 24 hr tablet Take 2 tabs with breakfast and 2 tabs with supper Patient taking differently: Take 1,000 mg by mouth 2 (two) times daily.  05/06/17  Yes Johnson, Clanford L, MD  OXYGEN Inhale 4-8 L into the lungs See admin instructions. 4 liters CONTINUOUSLY and to titrate up to 8 liters CONTINUOUSLY (with exertion) "to keep sats above 90" ([employer])   Yes [provider]  pantoprazole (PROTONIX) 40 MG tablet TAKE  1 TABLET BY MOUTH EVERY DAY *TAKE 30-60 MINUTES BEFORE FIRST MEAL OF THE DAY* Patient taking differently: TAKE 40MG BY MOUTH EVERY DAY *TAKE 30-60 MINUTES BEFORE FIRST MEAL OF THE DAY* 06/29/16  Yes Tanda Rockers, MD  potassium chloride SA (K-DUR,KLOR-CON) 20 MEQ tablet Take 1 tablet (20 mEq total) by mouth daily. 12/12/16  Yes Bonnielee Haff, MD  predniSONE (DELTASONE) 10 MG tablet Take 2 tablets (20 mg total) by mouth daily with breakfast. Until FU with  Pulmonary MD 03/04/17  Yes Domenic Polite, MD  terazosin (HYTRIN) 2 MG capsule Take 2 mg by mouth at bedtime.   Yes [provider]  dextromethorphan-guaiFENesin (MUCINEX DM) 30-600 MG 12hr tablet Take 1 tablet by mouth 2 (two) times daily.     [provider]    Inpatient Medications: Scheduled Meds: . apixaban  5 mg Oral BID  . bumetanide  2 mg Oral BID  . calcium-vitamin D  1 tablet Oral BID  . dextromethorphan-guaiFENesin  1 tablet Oral BID  . digoxin  0.25 mg Oral Daily  . diltiazem  300 mg Oral Daily  . famotidine  20 mg Oral QHS  . feeding supplement (PRO-STAT SUGAR FREE 64)  30 mL Oral BID  . insulin aspart  0-5 Units Subcutaneous QHS  . insulin aspart  0-9 Units Subcutaneous TID WC  . insulin glargine  10 Units Subcutaneous Daily  . loratadine  10 mg Oral Daily  . mouth rinse  15 mL Mouth Rinse BID  . pantoprazole  40 mg Oral Daily  . predniSONE  20 mg Oral Q breakfast  . terazosin  2 mg Oral QHS   Continuous Infusions: . piperacillin-tazobactam (ZOSYN)  IV     PRN Meds: acetaminophen, albuterol, HYDROcodone-acetaminophen, metoprolol tartrate, ondansetron (ZOFRAN) IV, sodium chloride  Allergies:    Allergies  Allergen Reactions  . Lovastatin Rash    Social History:   Social History   Social History  . Marital status: Single    Spouse name: N/A  . Number of children: 2  . Years of education: N/A   Occupational History  . Retired Optometrist    Social History Main Topics  . Smoking status: Former Smoker    Packs/day: 1.50    Years: 37.00    Types: Cigarettes    Quit date: 10/14/1995  . Smokeless tobacco: Never Used  . Alcohol use 1.8 oz/week    3 Cans of beer per week     Comment: occasionally  . Drug use: No  . Sexual activity: Not on file   Other Topics Concern  . Not on file   Social History Narrative  . No narrative on file    Family History:    Family History  Problem Relation Age of Onset  . Heart disease Father   .  Emphysema Father   . Liver cancer Mother      ROS:  Please see the history of present illness.  ROS  All other ROS reviewed and negative.     Physical Exam/Data:   Vitals:   06/16/17 0141 06/16/17 0502 06/16/17 0900 06/16/17 0902  BP: 103/71 96/78  93/80  Pulse: 68 98 (!) 116   Resp: 20 19    Temp:  97.6 F (36.4 C)    TempSrc:  Oral    SpO2: 90% 95%    Weight:  114 lb 3.2 oz (51.8 kg)    Height:        Intake/Output Summary (Last 24 hours) at 06/16/17  [place] filed at 06/16/17 0837  Gross per 24 hour  Intake              840 ml  Output             5250 ml  Net            -4410 ml   Filed Weights   06/14/17 0412 06/15/17 0602 06/16/17 0502  Weight: 122 lb 11.2 oz (55.7 kg) 120 lb 14.4 oz (54.8 kg) 114 lb 3.2 oz (51.8 kg)   Body mass index is 20.23 kg/m.  General:  Chronically ill appearing, cachectic, appears in no physical distress  Lymph: no adenopathy Neck: no JVD Endocrine:  No thryomegaly Vascular: No carotid bruits;  Cardiac:  tachycardic Lungs:  diminished throughout Abd: soft, nontender, no hepatomegaly  Ext: no edema Musculoskeletal:  No deformities, chachetic Skin: warm and dry  Neuro:  No gross focal abnormalities noted Psych:  Normal affect, very plesent  EKG:  The EKG was personally reviewed and demonstrates:   Initially Aflutter V rate 71 Last night 141bpm Telemetry:  Telemetry was personally reviewed and demonstrates:  AFlutter currently 130-140  Relevant CV Studies:  2D Echo 05/01/17 Study Conclusions - Left ventricle: The cavity size was mildly reduced. Systolic function was normal. The estimated ejection fraction was in the range of 55% to 60%. Wall motion was normal; there were no regional wall motion abnormalities. Doppler parameters are consistent with abnormal left ventricular relaxation (grade 1 diastolic dysfunction). - Ventricular septum: The contour showed diastolic flattening and systolic flattening. These  changes are consistent with RV volume and pressure overload. - Aortic valve: There was very mild stenosis. Valve area (VTI): 1.85 cm^2. Valve area (Vmax): 1.61 cm^2. Valve area (Vmean): 1.5 cm^2. - Right ventricle: The cavity size was severely dilated. Systolic function was moderately to severely reduced. - Right atrium: The atrium was severely dilated. - Tricuspid valve: There was moderate-severe regurgitation directed eccentrically. - Pulmonary arteries: Systolic pressure was severely increased. PA peak pressure: 87 mm Hg (S). Left atrium: The atrium was normal in size. Right atrium: The atrium was severely dilated.  Laboratory Data:  Chemistry Recent Labs Lab 06/14/17 0525 06/14/17 1625 06/15/17 0159 06/16/17 0131  NA 133*  --  132* 136  K 3.0* 4.1 3.3* 3.9  CL 93*  --  90* 93*  CO2 32  --  33* 36*  GLUCOSE 80  --  226* 195*  BUN 28*  --  25* 22*  CREATININE 0.75  --  0.75 0.75  CALCIUM 8.2*  --  8.1* 8.5*  GFRNONAA >60  --  >60 >60  GFRAA >60  --  >60 >60  ANIONGAP 8  --  9 7     Recent Labs Lab 06/10/17 2016 06/14/17 0525  PROT 7.2 5.1*  ALBUMIN 3.2* 2.5*  AST 66* 49*  ALT 97* 54  ALKPHOS 134* 106  BILITOT 1.3* 1.1   Hematology Recent Labs Lab 06/11/17 0446 06/12/17 0213 06/16/17 0131  WBC 10.6* 8.8 13.4*  RBC 3.90* 3.88* 4.04*  HGB 11.4* 11.2* 11.6*  HCT 33.5* 34.3* 37.0*  MCV 85.9 88.4 91.6  MCH 29.2 28.9 28.7  MCHC 34.0 32.7 31.4  RDW 16.5* 16.8* 16.9*  PLT 296 286 205   Cardiac Enzymes Recent Labs Lab 06/16/17 0131 06/16/17 0635  TROPONINI 0.10* 0.10*    Recent Labs Lab 06/10/17 2025  TROPIPOC 0.05    BNP Recent Labs Lab 06/10/17 2016  BNP 1,928.5*  DDimer No results for input(s): DDIMER in the last 168 hours.  Radiology/Studies:  Dg Chest Port 1 View Result Date: 06/15/2017 CLINICAL DATA:  Pneumonia.  Shortness of breath EXAM: PORTABLE CHEST 1 VIEW COMPARISON:  Five days ago FINDINGS: Lower lung volumes  with progressed bilateral opacity. Haziness at the right base, possible small effusion. Chronic cardiomegaly. No pneumothorax. IMPRESSION: History of pneumonia with lower lung volumes and increased opacity compared to 5 days ago. Electronically Signed   By: Monte Fantasia M.D.   On: 06/15/2017 14:36    Assessment and Plan:   1. AFlutter (typical) w/RVR     Rates 120's-140's     CHA2DS2Vasc is at least 4, on Eliquis      Dilt 371m seemed to give HR control, down-titrated for lower BP readings     In reviewed of vitals, his BP currently does not appear signficantly improved if at all from his readings on the 3632mdose.  Would first resume his 36074mose If need and if room on BP might consider Bystolic or as cardioselective BB as possible  Last ditch would be to consider procedural options without sedation.  Perhaps PPM and AV ablation.   2. Acute chronic respiratory failure 3. pneumonia 4. ILD 5. Severe P.HTN  Signed, RenBaldwin JamaicaA-C  06/16/2017 12:01 PM  I have seen and examined this patient with RenTommye StandardAgree with above, note added to reflect my findings.  On exam, tachycardic, no murmurs, lungs diminished. Presented with pneumonia and respiratory failure. Has atrial flutter and became rapid during this admission. Diltiazem decreased to 300 mg. Would increase diltiazem to 360 mg. His rate has improved and is now in the 70-80s. Should he become more tachycardic, would try bystolic.    Sanye Ledesma M. Megon Kalina MD 06/16/2017 2:33 PM

## 2017-06-16 NOTE — Discharge Instructions (Signed)

## 2017-06-16 NOTE — Progress Notes (Signed)
Pt had low CBG result at 62. Pt given graham crackers and orange juice. Will continue to monitor.

## 2017-06-16 NOTE — Progress Notes (Signed)
  Pharmacy Antibiotic Note  Gilbert Dominguez is a 71 y.o. male presenting with possible aspiration pneumonia. WBC are elevated at 13.4 and patient is presenting with tachycardia. Refusing CPAP. Pharmacy has been consulted for Zosyn dosing.  Plan: Zosyn 3.375g IV q8h (4 hour infusion).  Monitor renal function and clinical progress  Height: _0  (160 cm) Weight: 114 lb 3.2 oz (51.8 kg) IBW/kg (Calculated) : 56.9  Temp (24hrs), Avg:98.1 F (36.7 C), Min:97.6 F (36.4 C), Max:98.4 F (36.9 C)   Recent Labs Lab 06/10/17 2016 06/11/17 0446  06/12/17 0213 06/13/17 0348 06/14/17 0525 06/15/17 0159 06/16/17 0131  WBC 12.0* 10.6*  --  8.8  --   --   --  13.4*  CREATININE 1.65* 1.36*  < > 1.18 1.02 0.75 0.75 0.75  < > = values in this interval not displayed.  Estimated Creatinine Clearance: 61.2 mL/min (by C-G formula based on SCr of 0.75 mg/dL).    Allergies  Allergen Reactions  . Lovastatin Rash    Antimicrobials this admission: Vanc 8/23 >> 8/26 Cefepime 8/23 >>8/27 Augmentin 8/27 >> 8/29 Zosyn 8/29 >>   Microbiology results: 8/24 blood x2- ngtd  Thank you for allowing pharmacy to be a part of this patient's care.   Shadoe Cryan L. Kyung Rudd, PharmD, [place] PGY1 Pharmacy Resident Pager: [phone]

## 2017-06-16 NOTE — Progress Notes (Signed)
PROGRESS NOTE   Gilbert Dominguez  XBM:[id number]    DOB: Feb 07, 1946    DOA: 06/10/2017  PCP: Leonard Downing, MD   I have briefly reviewed patients [facility].  Brief Narrative:  70 wm pulmonary fibrosis, chronic hypoxic respiratory failure on home oxygen 4 L/m,  ongoing tobacco abuse,  chronic diastolic CHF,  atrial flutter on Eliquis and HTN presented to the ED with worsening dyspnea over last couple of days, no significant change in his chronic cough, some worsening nausea without vomiting.  Chronic leg edema.   In ED, hypoxic in the 80s on 4 L oxygen, blood pressure 90/70, chest x-ray concerning for pneumonia superimposed on chronic lung disease, sodium 118 down from 134 week ago, creatinine 1.65.   Admitted to step down for evaluation and management of acute on chronic hypoxic respiratory failure suspected secondary to healthcare associated pneumonia and for hyponatremia.Patient was clinically improving.   However on night of 8/26, went into A. fib with RVR in the 140s (in the context of home Cardizem dose being marginally reduced due to soft blood pressures). Cardiology consulted on 8/27.   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 Aspiration pneumonia: Treated empirically with vancomycin and cefepime. Urine pneumococcal antigen negative. Urine Legionella antigen: Negative, blood cultures: Negative to date. HIV screen nonreactive. Improving. Since cultures negative MRSA PCR negative, DC vancomycin. Transitioned to  oral Augmentin on 8/27--CXR 1 vw 8/28 ? Progression and concrns for aspiration--tells me has been coughing while eating--wife confirms--have switched back to IV abx Zosyn from Augmentin 8/29 as no sig improvement in condition 2. Acute on chronic hypoxic respiratory failure: Secondary to HCAP complicating underlying pulmonary fibrosis and chronic CHF.  Very dyspneoic needing NRB today.   3. Hypervolemic Hyponatremia: Sodium 118 on admission, down from 134 on 8/16.--resolving Resumed home dose of Bumex. Sodium and fluid restriction. 4. Acute kidney injury: Presented with serum creatinine of 1.65, 0.88 on 8/16. Monitor BMP while on Bumex. 5. Atrial flutter with RVR: Rate controlled. CHADS-VASc is 4 Continue Eliquis-Cardizem from 360 to 300 MG daily 8/26, went into A. Fib/flutter with RVR in the 140s and was started on Cardizem infusion. Cardiology was consulted and input appreciated: Atrial flutter rate control is very challenging, especially during intercurrent illness.? EP for RF ablation if fails to respond to rate control medications. On digoxin-get level 8/31.  Cont elliquis 5 bid 6. 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 Resumed Bumex. He seems to have predominantly right heart failure due to pulmonary hypertension. . Weight down from 132 pounds on 8/24 > 122 pounds on 8/27-->114. -13 L since admission. 7. DM 2: A1c 6.9 in July 2018. SSI. Holding metformin. eatign sugars sweets at bedside-Added Lantus 5 units daily. Now that his steroids have been switched to by mouth, expect to improve. CBGs 258-296--increasing 8/29 to 10 U lantus 8. Interstitial lung disease: Oral prednisone was switched to IV steroids> switched back to home PO Prednisone starting 8/26.  No clinical bronchospasm. 9. Anemia: Stable 10. Transaminitis:? Hepatic congestion. Follow LFTs, slightly better.   DVT prophylaxis: Eliquis Code Status:  Full Family Communication: None at bedside Disposition: DC home when medically improved. Not medically ready for discharge.   Consultants:  Cardiology  Procedures:  BiPAP  Antimicrobials:  Cefepime and vancomycin-discontinued.  Oral Augmentin 8/27 >   Subjective: Hr still 140's coughing ++ now tells me he coughs when he eats This has been going on "a while"   Objective:  Vitals:   06/15/17 2114 06/16/17 0044 06/16/17 0141 06/16/17 0502  BP: 112/77 94/74 103/71 96/78  Pulse: 93 (!) 142 68 98  Resp:  (!) _0 Temp: 98.2 F (36.8 C)   97.6 F (36.4 C)  TempSrc: Oral   Oral  SpO2: 96% 90% 90% 95%  Weight:    51.8 kg (114 lb 3.2 oz)  Height:        Examination:  Cachectic, slight WOB iuncrease Crackles over lung fields postetirorly with decreased fremitus + dull noteto percussion R post lung fields s1 s 2tachy-hsm abd soft nt nd no rebound Trace le edema Neuro intact, psych intact   Data Reviewed: I have personally reviewed following labs and imaging studies  CBC:  Recent Labs Lab 06/10/17 2016 06/11/17 0446 06/12/17 0213 06/16/17 0131  WBC 12.0* 10.6* 8.8 13.4*  NEUTROABS 11.2* 10.0*  --  11.7*  HGB 13.2 11.4* 11.2* 11.6*  HCT 39.6 33.5* 34.3* 37.0*  MCV 87.0 85.9 88.4 91.6  PLT 410* 296 286 076   Basic Metabolic Panel:  Recent Labs Lab 06/12/17 0213 06/13/17 0348 06/14/17 0525 06/14/17 1625 06/15/17 0159 06/16/17 0131  NA 128* 131* 133*  --  132* 136  K 4.1 3.6 3.0* 4.1 3.3* 3.9  CL 91* 94* 93*  --  90* 93*  CO2 26 26 32  --  33* 36*  GLUCOSE 161* 257* 80  --  226* 195*  BUN 51* 45* 28*  --  25* 22*  CREATININE 1.18 1.02 0.75  --  0.75 0.75  CALCIUM 8.7* 8.3* 8.2*  --  8.1* 8.5*  MG  --   --   --   --   --  1.5*   Liver Function Tests:  Recent Labs Lab 06/10/17 2016 06/14/17 0525  AST 66* 49*  ALT 97* 54  ALKPHOS 134* 106  BILITOT 1.3* 1.1  PROT 7.2 5.1*  ALBUMIN 3.2* 2.5*   Coagulation Profile: No results for  input(s): INR, PROTIME in the last 168 hours. Cardiac Enzymes:  Recent Labs Lab 06/16/17 0131 06/16/17 0635  TROPONINI 0.10* 0.10*   HbA1C: No results for input(s): HGBA1C in the last 72 hours. CBG:  Recent Labs Lab 06/15/17 0740 06/15/17 1110 06/15/17 1643 06/15/17 2149 06/16/17 0733  GLUCAP 174* 258* 296* 255* 235*    Recent Results (from the past 240 hour(s))  Culture, blood (routine x 2) Call MD if unable to obtain prior to antibiotics being given     Status: None (Preliminary result)   Collection Time: 06/11/17  8:55 AM  Result Value Ref Range Status   Specimen Description BLOOD LEFT ANTECUBITAL  Final   Special Requests   Final    BOTTLES DRAWN AEROBIC AND ANAEROBIC Blood Culture adequate volume   Culture NO GROWTH 4 DAYS  Final   Report Status PENDING  Incomplete  Culture, blood (routine x 2) Call MD if unable to obtain prior to antibiotics being given     Status: None (Preliminary  result)   Collection Time: 06/11/17  9:04 AM  Result Value Ref Range Status   Specimen Description BLOOD LEFT WRIST  Final   Special Requests IN PEDIATRIC BOTTLE Blood Culture adequate volume  Final   Culture NO GROWTH 4 DAYS  Final   Report Status PENDING  Incomplete     Radiology Studies: Dg Chest Port 1 View  Result Date: 06/15/2017 CLINICAL DATA:  Pneumonia.  Shortness of breath EXAM: PORTABLE CHEST 1 VIEW COMPARISON:  Five days ago FINDINGS: Lower lung volumes with progressed bilateral opacity. Haziness at the right base, possible small effusion. Chronic cardiomegaly. No pneumothorax. IMPRESSION: History of pneumonia with lower lung volumes and increased opacity compared to 5 days ago. Electronically Signed   By: Monte Fantasia M.D.   On: 06/15/2017 14:36   Scheduled Meds: . apixaban  5 mg Oral BID  . bumetanide  2 mg Oral BID  . calcium-vitamin D  1 tablet Oral BID  . dextromethorphan-guaiFENesin  1 tablet Oral BID  . digoxin  0.25 mg Oral Daily  . diltiazem  300 mg Oral  Daily  . famotidine  20 mg Oral QHS  . feeding supplement (PRO-STAT SUGAR FREE 64)  30 mL Oral BID  . insulin aspart  0-5 Units Subcutaneous QHS  . insulin aspart  0-9 Units Subcutaneous TID WC  . insulin glargine  10 Units Subcutaneous Daily  . loratadine  10 mg Oral Daily  . mouth rinse  15 mL Mouth Rinse BID  . pantoprazole  40 mg Oral Daily  . potassium chloride SA  40 mEq Oral BID  . predniSONE  20 mg Oral Q breakfast  . terazosin  2 mg Oral QHS   Continuous Infusions:    LOS: 6 days     Verneita Griffes, MD [facility] (P) [phone]

## 2017-06-16 NOTE — Progress Notes (Signed)
Patient refused CPAP.

## 2017-06-16 NOTE — Evaluation (Signed)
Clinical/Bedside Swallow Evaluation Patient Details  Name: Gilbert Dominguez MRN: [id number] Date of Birth: 1946/03/11  Today's Date: 06/16/2017 Time: SLP Start Time (ACUTE ONLY): 27 SLP Stop Time (ACUTE ONLY): 1054 SLP Time Calculation (min) (ACUTE ONLY): 18 min  Past Medical History:  Past Medical History:  Diagnosis Date  . Atrial flutter ([place])    a. initially diagnosed in 04/2017, started on Eliquis b. recurrent in 05/2017  . CHF exacerbation ([place]) 02/24/2017  . Chronic respiratory failure ([place])   . Hypertension   . 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 Rheumatoid arthritis(714.0)    Past Surgical History:  Past Surgical History:  Procedure Laterality Date  . ACNE CYST REMOVAL  1968  . VASECTOMY  1973   HPI:  Pt is a 71 y/o male admitted with worsening right-sided PNA. PMH includes pulmonary fibrosis on 4L of oxygen, pulmonary HTN, RA, CHF, and DM. He had a BSE in February 2018 with normal appearing oropharyngeal swallow and recommendations for regular diet and thin liquids.    Assessment / Plan / Recommendation Clinical Impression  Upon SLP arrival, pt was desatting to the high 70s. SLP assisted pt with repositioning and RN arrived to adjust supplemental O2. Pt became more comfortable and VS stabilized with increase in FiO2 and use of NRB. SLP offered a few sips of water per pt request but held additional POs due to current respiratory status. No overt s/s of aspiration were observed, but he does endorse coughing and difficulty breathing when drinking liquids at home. I educated pt about the need to hold POs whenever he is having difficulty breathing. He may be able to tolerate small amounts of PO when his breathing is more stable, but I worry about his tenuous respiratory status. Recommend holding all POs should his respiratory status not show improvements or should he have any further decline. SLP will f/u for readiness to complete MBS. Plan discussed with MD. SLP Visit  Diagnosis: Dysphagia, unspecified (R13.10)    Aspiration Risk  Moderate aspiration risk    Diet Recommendation Regular;Thin liquid;Other (Comment) (HOLD if respiratory status doesn't improve or worsens)   Liquid Administration via: Cup;Straw Medication Administration: Whole meds with puree Supervision: Patient able to self feed;Full supervision/cueing for compensatory strategies Compensations: Slow rate;Small sips/bites Postural Changes: Seated upright at 90 degrees;Remain upright for at least 30 minutes after po intake    Other  Recommendations Oral Care Recommendations: Oral care QID   Follow up Recommendations  (tba)      Frequency and Duration min 2x/week  2 weeks       Prognosis        Swallow Study   General HPI: Pt is a 71 y/o male admitted with worsening right-sided PNA. PMH includes pulmonary fibrosis on 4L of oxygen, pulmonary HTN, RA, CHF, and DM. He had a BSE in February 2018 with normal appearing oropharyngeal swallow and recommendations for regular diet and thin liquids.  Type of Study: Bedside Swallow Evaluation Previous Swallow Assessment: see HPI Diet Prior to this Study: Regular;Thin liquids Temperature Spikes Noted: No Respiratory Status: Non-rebreather History of Recent Intubation: No Behavior/Cognition: Alert;Cooperative;Pleasant mood Oral Care Completed by SLP: No Vision: Functional for self-feeding Self-Feeding Abilities: Needs assist (due to mask) Patient Positioning: Upright in bed Baseline Vocal Quality: Normal    Oral/Motor/Sensory Function Overall Oral Motor/Sensory Function: Within functional limits   Ice Chips Ice chips: Not tested   Thin Liquid Thin Liquid: Within functional limits Presentation: Straw    Nectar Thick Nectar Thick Liquid:  Not tested   Honey Thick Honey Thick Liquid: Not tested   Puree Puree: Not tested   Solid   GO   Solid: Not tested        Germain Osgood 06/16/2017,11:15 AM   Germain Osgood, M.A.  CCC-SLP [phone]

## 2017-06-16 NOTE — Progress Notes (Signed)
I attempted to put PT on CPAP with 6L O2 attached to CPAP machine.  PT did not tolerate well , his Saturations dropped from high 90'2 to low 80's.  I placed Pt back on 6L patient is stable with 98% SpO2.  RT will continue to monitor.

## 2017-06-16 NOTE — Consult Note (Signed)
Name: Stevenson Windmiller MRN: [id number] DOB: [birth year]    ADMISSION DATE:  06/10/2017 CONSULTATION DATE:  8/29  REFERRING MD :  Verlon Au ([facility])   CHIEF COMPLAINT:  ILD   BRIEF PATIENT DESCRIPTION:  71 yo male former smoker with hx RA, A Flutter on eliquis, HTN, chronic hypoxic respiratory failure (on 4-8L O2) in the setting of pulmonary fibrosis with concomitant emphysema, pulm HTN and diastolic heart failure.  Presented 8/23 with increased dyspnea.  Was hypoxic in ER on 4L and found to be in AFib with RVR.  He was admitted by [facility] with concern for HCAP.   SIGNIFICANT EVENTS    STUDIES:  VQ scan 04/12/17>>> Low probability for acute pulmonary embolism. Pattern does not suggest chronic thromboembolic disease either.  CTA chest 04/29/17>>> 1. Suboptimal study due to poor contrast opacification of distal branch vessels, nondiagnostic for evaluation of subsegmental embolus in the bilateral lower lobes. There is no evidence for an acute central pulmonary embolus. 2. Cardiomegaly with enlarged main pulmonary vessels suggesting pulmonary artery hypertension. Reflux of contrast into the hepatic veins suggests elevated right heart pressures. 3. Moderate emphysema with scattered areas of subpleural fibrosis and mild bronchiectasis in the lower lobes. Probable focal atelectasis in the right upper lobe. 8/8 sleep study>> neg for OSA  Renal u/s 8/24>>> 1. Negative for hydronephrosis. 2. Possible medical renal disease. Mild left renal cortical thinning.   3. Small ascites. CXR 8/28>>> History of pneumonia with lower lung volumes and increased opacity compared to 5 days ago.   HISTORY OF PRESENT ILLNESS:  71 yo male former smoker with hx RA, A Flutter on eliquis, HTN, chronic hypoxic respiratory failure (on 4-8L O2) in the setting of pulmonary fibrosis with concomitant emphysema, pulm HTN and diastolic heart failure.  Presented 8/23 with increased dyspnea.  Was hypoxic in ER on 4L and found  to be in AFib with RVR.  He was admitted by [facility] with concern for HCAP.   Currently feeling better.  Afebrile. Denies cough, chest pain.  On baseline 4L O2.  Near baseline.    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  has a past surgical history that includes Vasectomy (1973) and Acne cyst removal (1968). Prior to Admission medications   Medication Sig Start Date End Date Taking? Authorizing Provider  acetaminophen (TYLENOL) 500 MG tablet Take 1,000 mg by mouth every 6 (six) hours as needed for mild pain.   Yes [provider]  Amino Acids-Protein Hydrolys (FEEDING SUPPLEMENT, PRO-STAT SUGAR FREE 64,) LIQD Take 30 mLs by mouth 2 (two) times daily. 05/06/17  Yes Johnson, Clanford L, MD  apixaban (ELIQUIS) 5 MG TABS tablet Take 1 tablet (5 mg total) by mouth 2 (two) times daily. 05/06/17  Yes Johnson, Clanford L, MD  bumetanide (BUMEX) 2 MG tablet Take 1 tablet (2 mg total) by mouth 2 (two) times daily. 05/06/17  Yes Johnson, Clanford L, MD  Calcium Carbonate-Vit D-Min (CALCIUM 600+D PLUS MINERALS PO) Take 1 tablet by mouth 2 (two) times daily.   Yes [provider]  diltiazem (CARDIZEM CD) 360 MG 24 hr capsule Take 1 capsule (360 mg total) by mouth daily. 06/07/17  Yes Hilty, Nadean Corwin, MD  famotidine (PEPCID) 20 MG tablet Take 1 tablet (20 mg total) by mouth at bedtime. 01/27/17  Yes Tanda Rockers, MD  loratadine (CLARITIN) 10 MG tablet Take  10 mg by mouth daily.   Yes [provider]  metFORMIN (GLUCOPHAGE XR) 500 MG 24 hr tablet Take 2 tabs with breakfast and 2 tabs with supper Patient taking differently: Take 1,000 mg by mouth 2 (two) times daily.  05/06/17  Yes Johnson, Clanford L, MD  OXYGEN Inhale 4-8 L into the lungs See admin instructions. 4 liters CONTINUOUSLY and to  titrate up to 8 liters CONTINUOUSLY (with exertion) "to keep sats above 90" ([employer])   Yes [provider]  pantoprazole (PROTONIX) 40 MG tablet TAKE 1 TABLET BY MOUTH EVERY DAY *TAKE 30-60 MINUTES BEFORE FIRST MEAL OF THE DAY* Patient taking differently: TAKE 40MG BY MOUTH EVERY DAY *TAKE 30-60 MINUTES BEFORE FIRST MEAL OF THE DAY* 06/29/16  Yes Tanda Rockers, MD  potassium chloride SA (K-DUR,KLOR-CON) 20 MEQ tablet Take 1 tablet (20 mEq total) by mouth daily. 12/12/16  Yes Bonnielee Haff, MD  predniSONE (DELTASONE) 10 MG tablet Take 2 tablets (20 mg total) by mouth daily with breakfast. Until FU with Pulmonary MD 03/04/17  Yes Domenic Polite, MD  terazosin (HYTRIN) 2 MG capsule Take 2 mg by mouth at bedtime.   Yes [provider]  dextromethorphan-guaiFENesin (MUCINEX DM) 30-600 MG 12hr tablet Take 1 tablet by mouth 2 (two) times daily.     [provider]   Allergies  Allergen Reactions  . Lovastatin Rash    FAMILY HISTORY:  family history includes Emphysema in his father; Heart disease in his father; Liver cancer in his mother. SOCIAL HISTORY:  reports that he quit smoking about 21 years ago. His smoking use included Cigarettes. He has a 55.50 pack-year smoking history. He has never used smokeless tobacco. He reports that he drinks about 1.8 oz of alcohol per week . He reports that he does not use drugs.  REVIEW OF SYSTEMS:   As per HPI - All other systems reviewed and were neg.    SUBJECTIVE:   VITAL SIGNS: Temp:  [97.6 F (36.4 C)-98.4 F (36.9 C)] 97.6 F (36.4 C) (08/29 0502) Pulse Rate:  [68-142] 116 (08/29 0900) Resp:  [15-21] 19 (08/29 0502) BP: (93-112)/(71-82) 93/80 (08/29 0902) SpO2:  [90 %-96 %] 95 % (08/29 0502) Weight:  [51.8 kg (114 lb 3.2 oz)] 51.8 kg (114 lb 3.2 oz) (08/29 0502)  PHYSICAL EXAMINATION: General:  Chronically ill appearing male, NAD, cachectic  Neuro:  Awake, alert, MAE  HEENT:  Mm moist, no JVD Cardiovascular:   s1s2 irreg  Lungs:  resps even non labored on 4L [place], clear  Abdomen:  Round, soft, non tender +bs  Musculoskeletal:  Warm and dry, no edema     Recent Labs Lab 06/14/17 0525 06/14/17 1625 06/15/17 0159 06/16/17 0131  NA 133*  --  132* 136  K 3.0* 4.1 3.3* 3.9  CL 93*  --  90* 93*  CO2 32  --  33* 36*  BUN 28*  --  25* 22*  CREATININE 0.75  --  0.75 0.75  GLUCOSE 80  --  226* 195*    Recent Labs Lab 06/11/17 0446 06/12/17 0213 06/16/17 0131  HGB 11.4* 11.2* 11.6*  HCT 33.5* 34.3* 37.0*  WBC 10.6* 8.8 13.4*  PLT 296 286 205   Dg Chest Port 1 View  Result Date: 06/15/2017 CLINICAL DATA:  Pneumonia.  Shortness of breath EXAM: PORTABLE CHEST 1 VIEW COMPARISON:  Five days ago FINDINGS: Lower lung volumes with progressed bilateral opacity. Haziness at the right base, possible small effusion. Chronic cardiomegaly. No  pneumothorax. IMPRESSION: History of pneumonia with lower lung volumes and increased opacity compared to 5 days ago. Electronically Signed   By: Monte Fantasia M.D.   On: 06/15/2017 14:36    ASSESSMENT / PLAN:  Acute on chronic respiratory failure - multifactorial in setting severe pulmonary fibrosis with concomitant emphysema, significant pulmonary HTN (PASP 65mHg) and heart failure.  Now c/b HCAP v aspiration PNA. Recently seen as outpt by Dr. MLake Bellswho was initiating further pulm HTN w/u (has neg sleep study, neg CTA chest) and considering pulmonary vasodilator especially given RA.   PLAN -  abx per primary  Speech eval for ?aspiration  supplemental O2 as needed to keep sats 88-92% HR control  Diuresis as tol  Continue home prednisone  F/u CXR  Mobilize as able   Will discuss further consideration of pulmonary vasodilator with Dr. MRae Mar NP 06/16/2017  1:20 PM Pager: [phone] or [phone]

## 2017-06-17 ENCOUNTER — Inpatient Hospital Stay (HOSPITAL_COMMUNITY): Payer: Medicare Other

## 2017-06-17 LAB — COMPREHENSIVE METABOLIC PANEL
ALBUMIN: 2.4 g/dL — AB (ref 3.5–5.0)
ALK PHOS: 117 U/L (ref 38–126)
ALT: 43 U/L (ref 17–63)
AST: 46 U/L — AB (ref 15–41)
Anion gap: 8 (ref 5–15)
BILIRUBIN TOTAL: 1.1 mg/dL (ref 0.3–1.2)
BUN: 24 mg/dL — AB (ref 6–20)
CO2: 37 mmol/L — ABNORMAL HIGH (ref 22–32)
CREATININE: 0.72 mg/dL (ref 0.61–1.24)
Calcium: 8.6 mg/dL — ABNORMAL LOW (ref 8.9–10.3)
Chloride: 89 mmol/L — ABNORMAL LOW (ref 101–111)
GFR calc Af Amer: >60 mL/min (ref >60)
GFR calc non Af Amer: >60 mL/min (ref >60)
GLUCOSE: 114 mg/dL — AB (ref 65–99)
POTASSIUM: 3.8 mmol/L (ref 3.5–5.1)
Sodium: 134 mmol/L — ABNORMAL LOW (ref 135–145)
TOTAL PROTEIN: 5.4 g/dL — AB (ref 6.5–8.1)

## 2017-06-17 LAB — GLUCOSE, CAPILLARY
GLUCOSE-CAPILLARY: 188 mg/dL — AB (ref 65–99)
GLUCOSE-CAPILLARY: 130 mg/dL — AB (ref 65–99)
Glucose-Capillary: 130 mg/dL — ABNORMAL HIGH (ref 65–99)
Glucose-Capillary: 236 mg/dL — ABNORMAL HIGH (ref 65–99)

## 2017-06-17 LAB — MAGNESIUM: Magnesium: 1.6 mg/dL — ABNORMAL LOW (ref 1.7–2.4)

## 2017-06-17 MED ORDER — INSULIN GLARGINE 100 UNIT/ML ~~LOC~~ SOLN
8.0000 [IU] | Freq: Every day | SUBCUTANEOUS | Status: DC
  Administered 2017-06-18 – 2017-06-19 (×2): 8 [IU] via SUBCUTANEOUS
  Filled 2017-06-17 (×2): qty 0.08

## 2017-06-17 NOTE — Progress Notes (Signed)
  Speech Language Pathology Treatment: Dysphagia  Patient Details Name: Marquett Bertoli MRN: [id number] DOB: 02-18-1946 Today's Date: 06/17/2017 Time: 1400-1430 SLP Time Calculation (min) (ACUTE ONLY): 30 min  Assessment / Plan / Recommendation Clinical Impression  Pt was seen for skilled ST targeting dysphagia goals.  Upon therapist's arrival, pt was found to have been incontinent  and needed total linens change.  When transferring to recliner from bed, pt demonstrated jump in heart rate to 100s and drop in O2 sats to mid 80s.  Pt remained tachycardic with heart rate only dropping to 90s at rest, O2 remained slightly labile going from high 90s to mid 80s even with cues for pursed lip breathing.  BP 90s/70s.  Pt was symptomatic with tachycardia, stating that he felt his heart was racing.  No increased work of breathing noted with desaturations and respiratory rate remained relatively stable (20s).   RN made aware. SLP facilitated the session with skilled observations during presentations of regular textures, purees, and thin liquids to assess toleration of current diet.  Pt reports coughing "often" during meals over the last 2-3 months but demonstrated no overt s/s of aspiration with any consistencies assessed today.   RN did not observe any difficulty during meals today.  Suspect that pt will be at risk of aspiration on any diet given his compromised cardiorespiratory status and subsequent deconditioning.  For today, recommending holding off on instrumental assessment but will check in the morning to see if pt can tolerate MBS.  Discussed plan with MD who advised very conservative care.  Therapist in agreement.    HPI HPI: Pt is a 71 y/o male admitted with worsening right-sided PNA. PMH includes pulmonary fibrosis on 4L of oxygen, pulmonary HTN, RA, CHF, and DM. He had a BSE in February 2018 with normal appearing oropharyngeal swallow and recommendations for regular diet and thin liquids.       SLP  Plan  Continue with current plan of care       Recommendations  Diet recommendations: Regular;Thin liquid Liquids provided via: Cup Medication Administration: Whole meds with puree Supervision: Patient able to self feed Compensations: Slow rate;Small sips/bites Postural Changes and/or Swallow Maneuvers: Seated upright 90 degrees                Oral Care Recommendations: Oral care BID Follow up Recommendations: Other (comment) (TBD) SLP Visit Diagnosis: Dysphagia, unspecified (R13.10) Plan: Continue with current plan of care       GO                Webster Patrone, Selinda Orion 06/17/2017, 2:38 PM

## 2017-06-17 NOTE — Progress Notes (Signed)
Progress Note  Patient Name: Gilbert Dominguez Date of Encounter: 06/17/2017  Primary Cardiologist: Hilty/Klein  Subjective   Weak, no change in dyspnea. HR around 70 at night, 140 during the day.  Inpatient Medications    Scheduled Meds: . apixaban  5 mg Oral BID  . bumetanide  2 mg Oral BID  . calcium-vitamin D  1 tablet Oral BID  . dextromethorphan-guaiFENesin  1 tablet Oral BID  . digoxin  0.25 mg Oral Daily  . diltiazem  360 mg Oral Daily  . famotidine  20 mg Oral QHS  . feeding supplement (PRO-STAT SUGAR FREE 64)  30 mL Oral BID  . insulin aspart  0-5 Units Subcutaneous QHS  . insulin aspart  0-9 Units Subcutaneous TID WC  . insulin glargine  10 Units Subcutaneous Daily  . loratadine  10 mg Oral Daily  . mouth rinse  15 mL Mouth Rinse BID  . pantoprazole  40 mg Oral Daily  . predniSONE  20 mg Oral Q breakfast  . terazosin  2 mg Oral QHS   Continuous Infusions: . piperacillin-tazobactam (ZOSYN)  IV 3.375 g (06/17/17 0719)   PRN Meds: acetaminophen, albuterol, HYDROcodone-acetaminophen, metoprolol tartrate, ondansetron (ZOFRAN) IV, sodium chloride   Vital Signs    Vitals:   06/16/17 1428 06/16/17 1916 06/16/17 2351 06/17/17 0425  BP: 103/70 (!) 120/92 95/65 100/76  Pulse: 88 (!) 132 (!) 47 (!) 45  Resp: (!) _0 Temp: 97.8 F (36.6 C) 97.8 F (36.6 C) 97.9 F (36.6 C) 97.8 F (36.6 C)  TempSrc: Axillary Oral Axillary Oral  SpO2: 95% 91% 100% 94%  Weight:    113 lb 4.8 oz (51.4 kg)  Height:        Intake/Output Summary (Last 24 hours) at 06/17/17 0921 Last data filed at 06/17/17 0057  Gross per 24 hour  Intake             1662 ml  Output             4825 ml  Net            -3163 ml   Filed Weights   06/15/17 0602 06/16/17 0502 06/17/17 0425  Weight: 120 lb 14.4 oz (54.8 kg) 114 lb 3.2 oz (51.8 kg) 113 lb 4.8 oz (51.4 kg)    Telemetry    A flutter with variable AV block, mostly 4:1 at night and 2:1 during day - Personally  Reviewed  ECG    No new tracing - Personally Reviewed  Physical Exam  cachectic GEN: No acute distress.   Neck: JVP 8 cm Cardiac: irregular, no murmurs, rubs, or gallops.  Respiratory: Clear to auscultation bilaterally. GI: Soft, nontender, non-distended  MS: No edema; No deformity. Neuro:  Nonfocal  Psych: Normal affect   Labs    Chemistry Recent Labs Lab 06/10/17 2016  06/14/17 0525  06/15/17 0159 06/16/17 0131 06/17/17 0317  NA 118*  < > 133*  --  132* 136 134*  K 4.0  < > 3.0*  < > 3.3* 3.9 3.8  CL 81*  < > 93*  --  90* 93* 89*  CO2 19*  < > 32  --  33* 36* 37*  GLUCOSE 204*  < > 80  --  226* 195* 114*  BUN 55*  < > 28*  --  25* 22* 24*  CREATININE 1.65*  < > 0.75  --  0.75 0.75 0.72  CALCIUM 8.7*  < > 8.2*  --  8.1* 8.5* 8.6*  PROT 7.2  --  5.1*  --   --   --  5.4*  ALBUMIN 3.2*  --  2.5*  --   --   --  2.4*  AST 66*  --  49*  --   --   --  46*  ALT 97*  --  54  --   --   --  43  ALKPHOS 134*  --  106  --   --   --  117  BILITOT 1.3*  --  1.1  --   --   --  1.1  GFRNONAA 40*  < > >60  --  >60 >60 >60  GFRAA 46*  < > >60  --  >60 >60 >60  ANIONGAP 18*  < > 8  --  _0 < > = values in this interval not displayed.   Hematology Recent Labs Lab 06/11/17 0446 06/12/17 0213 06/16/17 0131  WBC 10.6* 8.8 13.4*  RBC 3.90* 3.88* 4.04*  HGB 11.4* 11.2* 11.6*  HCT 33.5* 34.3* 37.0*  MCV 85.9 88.4 91.6  MCH 29.2 28.9 28.7  MCHC 34.0 32.7 31.4  RDW 16.5* 16.8* 16.9*  PLT 296 286 205    Cardiac Enzymes Recent Labs Lab 06/16/17 0131 06/16/17 0635 06/16/17 1328  TROPONINI 0.10* 0.10* 0.09*    Recent Labs Lab 06/10/17 2025  TROPIPOC 0.05     BNP Recent Labs Lab 06/10/17 2016  BNP 1,928.5*     DDimer No results for input(s): DDIMER in the last 168 hours.   Radiology    Dg Chest Port 1 View  Result Date: 06/15/2017 CLINICAL DATA:  Pneumonia.  Shortness of breath EXAM: PORTABLE CHEST 1 VIEW COMPARISON:  Five days ago FINDINGS: Lower lung  volumes with progressed bilateral opacity. Haziness at the right base, possible small effusion. Chronic cardiomegaly. No pneumothorax. IMPRESSION: History of pneumonia with lower lung volumes and increased opacity compared to 5 days ago. Electronically Signed   By: Monte Fantasia M.D.   On: 06/15/2017 14:36    Cardiac Studies   2D Echo 05/01/17 Study Conclusions  - Left ventricle: The cavity size was mildly reduced. Systolic function was normal. The estimated ejection fraction was in the range of 55% to 60%. Wall motion was normal; there were no regional wall motion abnormalities. Doppler parameters are consistent with abnormal left ventricular relaxation (grade 1 diastolic dysfunction). - Ventricular septum: The contour showed diastolic flattening and systolic flattening. These changes are consistent with RV volume and pressure overload. - Aortic valve: There was very mild stenosis. Valve area (VTI): 1.85 cm^2. Valve area (Vmax): 1.61 cm^2. Valve area (Vmean): 1.5 cm^2. - Right ventricle: The cavity size was severely dilated. Systolic function was moderately to severely reduced. - Right atrium: The atrium was severely dilated. - Tricuspid valve: There was moderate-severe regurgitation directed eccentrically. - Pulmonary arteries: Systolic pressure was severely increased. PA peak pressure: 87 mm Hg (S).  Left atrium: The atrium was normal in size. Right atrium: The atrium was severely dilated.    Patient Profile     71 y.o. male with long term persistent AFlutter and chronic right heart failure with severe PAH and longstanding chronic respiratory failure due to a combination of COPD, interstitial lung disease, possible arterial lung disease due to rheumatoid arthritis, DM admitted with pneumonia, making rate control of AFlutter even harder. Low systemic BP limits use of conventional AV blocking agents.  Assessment & Plan    1. AFlutter:  rate  control adequate only when asleep. Dilt dose higher today. If rate control is still poor today, add a highly selective beta blocker tomorrow (I think bisoprolol preferable to bystolic since it will not lower BP as much). On Eliquis. Check Dig level 2. 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 Role of pulmonary vasodilators is questionable. 3. CHF: primarily R sided. Volume status is probably adequate 4. ILD/ RA and COPD: on steroids and bronchodilators.  Prognosis is poor . Home hospice/palliative care are probably appropriate.  Signed, Sanda Klein, MD  06/17/2017, 9:21 AM

## 2017-06-17 NOTE — Progress Notes (Signed)
PROGRESS NOTE   Gilbert Dominguez  FYT:[id number]    DOB: 1946-08-12    DOA: 06/10/2017  PCP: Leonard Downing, MD   I have briefly reviewed patients previous medical records in [facility].  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  Chronic leg edema.   In ED, hypoxic in the 80s on 4 L oxygen, blood pressure 90/70, chest x-ray concerning for pneumonia superimposed on chronic lung disease, sodium 118 down from 134 week ago, creatinine 1.65.   Admitted to step down for evaluation and management of acute on chronic hypoxic respiratory failure suspected secondary to healthcare associated pneumonia and for hyponatremia.Patient was clinically improving.   However on night of 8/26, went into A. fib with RVR in the 140s (in the context of home Cardizem dose being marginally reduced due to soft blood pressures). Cardiology consulted on 8/27.   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 Aspiration pneumonia: Treated empirically with vancomycin and cefepime. Urine pneumococcal antigen negative. Urine Legionella antigen: Negative, blood cultures: Negative to date. HIV screen nonreactive. Improving. Since cultures negative MRSA PCR negative, DC  vancomycin. Transitioned to oral Augmentin on 8/27--CXR 1 vw 8/28 ? Progression and concrns for aspiration--tells me has been coughing while eating--wife confirms--have switched back to IV abx Zosyn from Augmentin 8/29 --high risk to do any obj study such as MBS--Rx empirically but if resp status looks reasonable, could try to do [facility] 8/30 appreciate SLP input and thoughtfulness 2. Acute on chronic hypoxic respiratory failure: labile dyspnea --monitor  3. Hypervolemic Hyponatremia: Sodium 118 on admission, down from 134 on 8/16.--resolving Resumed home dose of Bumex. Sodium and fluid restriction. 4. Acute kidney injury: Presented with serum creatinine of 1.65, 0.88 on 8/16. Monitor BMP while on Bumex. Currently 24/0.72 Atrial flutter with RVR: Rate controlled. CHADS-VASc is 4 Continue Eliquis-Cardizem from 360 to 300 MG daily 8/26, went into A. Fib/flutter with RVR in the 140s and was started on Cardizem infusion. Cardiology + EP --? dose cardizem back to 360 8/30. On digoxin 0.25-dig level pending 5. 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 Resumed Bumex. He seems to have predominantly right heart failure due to pulmonary hypertension. . Weight down from 132 pounds on 8/24 > 122 pounds on 8/27-->114--->113. -16 L since admission.  Not a candidate for advanced therapies for PAH 6. DM 2: A1c 6.9 in July 2018. SSI. Holding metformin. eatign sugars sweets at bedside-Added Lantus 5 units daily. Now that his steroids have been switched to by mouth, expect to improve. CBGs 130-188--increasing 8/29 to 10 U lantus 7. Interstitial lung disease, Rheum lung?: Oral prednisone was switched to IV steroids> switched back to home PO Prednisone  starting 8/26. Will defer to CCM adding back IV--not sure if with this advanced lung disease will make overall difference 8. Anemia: Stable 9. Transaminitis:? Hepatic congestion. Follow LFTs,  slightly better.   DVT prophylaxis: Eliquis Code Status: we had a liong chat-he has multiple medical illnesses and his body is failing him.  He elects DNR.  I've encouraged him to talk to his wife about this Family Communication: None at bedside--talked to wife 8/29 last Disposition: DC home when medically improved. Not medically ready for discharge.   Consultants:  Cardiology  Procedures:  BiPAP  Antimicrobials:  Cefepime and vancomycin-discontinued.  Oral Augmentin 8/27 >   Subjective:  Labile HR  Labile RR talking in full sentences--suprisingly looks better and seems happier No other new issue eating less sugary sweets No cp No weakness   Objective:  Vitals:   06/16/17 2351 06/17/17 0425 06/17/17 1345 06/17/17 1427  BP: 95/65 100/76  94/71  Pulse: (!) 47 (!) 45 80 85  Resp: 17 20 (!) 21   Temp: 97.9 F (36.6 C) 97.8 F (36.6 C)  97.6 F (36.4 C)  TempSrc: Axillary Oral  Oral  SpO2: 100% 94% 97% 94%  Weight:  51.4 kg (113 lb 4.8 oz)    Height:        Examination:  Awake alert smiling--HR 100's Less WOB Tachy HSM Decreased post crackles abd soft nt nd no rebound Trace le edema Neuro intact, psych intact   Data Reviewed: I have personally reviewed following labs and imaging studies  CBC:  Recent Labs Lab 06/10/17 2016 06/11/17 0446 06/12/17 0213 06/16/17 0131  WBC 12.0* 10.6* 8.8 13.4*  NEUTROABS 11.2* 10.0*  --  11.7*  HGB 13.2 11.4* 11.2* 11.6*  HCT 39.6 33.5* 34.3* 37.0*  MCV 87.0 85.9 88.4 91.6  PLT 410* 296 286 119   Basic Metabolic Panel:  Recent Labs Lab 06/13/17 0348 06/14/17 0525 06/14/17 1625 06/15/17 0159 06/16/17 0131 06/17/17 0317  NA 131* 133*  --  132* 136 134*  K 3.6 3.0* 4.1 3.3* 3.9 3.8  CL 94* 93*  --  90* 93* 89*  CO2 26 32  --  33* 36* 37*  GLUCOSE 257* 80  --  226* 195* 114*  BUN 45* 28*  --  25* 22* 24*  CREATININE 1.02 0.75  --  0.75 0.75 0.72  CALCIUM 8.3* 8.2*  --  8.1* 8.5* 8.6*  MG  --   --   --    --  1.5* 1.6*   Liver Function Tests:  Recent Labs Lab 06/10/17 2016 06/14/17 0525 06/17/17 0317  AST 66* 49* 46*  ALT 97* 54 43  ALKPHOS 134* 106 117  BILITOT 1.3* 1.1 1.1  PROT 7.2 5.1* 5.4*  ALBUMIN 3.2* 2.5* 2.4*   Coagulation Profile: No results for input(s): INR, PROTIME in the last 168 hours. Cardiac Enzymes:  Recent Labs Lab 06/16/17 0131 06/16/17 0635 06/16/17 1328  TROPONINI 0.10* 0.10* 0.09*   HbA1C: No results for input(s): HGBA1C in the last 72 hours. CBG:  Recent Labs Lab 06/16/17 1141 06/16/17 1637 06/16/17 2037 06/17/17 0724 06/17/17 1121  GLUCAP 62* 263* 137* 130* 188*    Recent Results (from the past 240 hour(s))  Culture, blood (routine x 2) Call MD if unable to obtain prior to antibiotics being given     Status: None   Collection Time: 06/11/17  8:55 AM  Result Value Ref Range Status   Specimen Description BLOOD LEFT ANTECUBITAL  Final   Special  Requests   Final    BOTTLES DRAWN AEROBIC AND ANAEROBIC Blood Culture adequate volume   Culture NO GROWTH 5 DAYS  Final   Report Status 06/16/2017 FINAL  Final  Culture, blood (routine x 2) Call MD if unable to obtain prior to antibiotics being given     Status: None   Collection Time: 06/11/17  9:04 AM  Result Value Ref Range Status   Specimen Description BLOOD LEFT WRIST  Final   Special Requests IN PEDIATRIC BOTTLE Blood Culture adequate volume  Final   Culture NO GROWTH 5 DAYS  Final   Report Status 06/16/2017 FINAL  Final     Radiology Studies: Dg Chest 2 View  Result Date: 06/17/2017 CLINICAL DATA:  Pneumonia. Shortness of breath. Congestive heart failure and pulmonary fibrosis. EXAM: CHEST  2 VIEW COMPARISON:  06/15/2017 FINDINGS: Stable cardiomegaly. There has been mild improvement in bilateral heterogeneous airspace disease since previous study. Underlying chronic bibasilar interstitial lung disease again noted. Aortic atherosclerosis. IMPRESSION: Mild improvement in bilateral  heterogeneous airspace disease since previous study. Stable cardiomegaly and underlying chronic interstitial lung disease. Electronically Signed   By: Earle Gell M.D.   On: 06/17/2017 09:25   Scheduled Meds: . apixaban  5 mg Oral BID  . bumetanide  2 mg Oral BID  . calcium-vitamin D  1 tablet Oral BID  . dextromethorphan-guaiFENesin  1 tablet Oral BID  . digoxin  0.25 mg Oral Daily  . diltiazem  360 mg Oral Daily  . famotidine  20 mg Oral QHS  . feeding supplement (PRO-STAT SUGAR FREE 64)  30 mL Oral BID  . insulin aspart  0-5 Units Subcutaneous QHS  . insulin aspart  0-9 Units Subcutaneous TID WC  . insulin glargine  10 Units Subcutaneous Daily  . loratadine  10 mg Oral Daily  . mouth rinse  15 mL Mouth Rinse BID  . pantoprazole  40 mg Oral Daily  . predniSONE  20 mg Oral Q breakfast  . terazosin  2 mg Oral QHS   Continuous Infusions: . piperacillin-tazobactam (ZOSYN)  IV 3.375 g (06/17/17 1433)     LOS: 7 days     Verneita Griffes, MD [facility] (P) [phone]

## 2017-06-17 NOTE — Consult Note (Signed)
Name: Gilbert Dominguez MRN: [id number] DOB: 12-03-1945    ADMISSION DATE:  06/10/2017 CONSULTATION DATE:  8/29  REFERRING MD :  Verlon Au ([facility])   CHIEF COMPLAINT:  ILD   BRIEF PATIENT DESCRIPTION:   71 yo male former smoker presented to ER with dyspnea.  Found to have progressive pulmonary infiltrate, worsening hypoxia, and a fib with RVR.  Hx of RA with pulmonary fibrosis, emphysema, pulmonary HTN, diastolic CHF, and chronic respiratory failure on 4 liters oxygen.   SUBJECTIVE:  He feels like his breathing is better.  Joint pain better also.  Has cough, but not much sputum.  Denies chest/abd pain.  VITAL SIGNS: BP 100/76 (BP Location: Left Arm)   Pulse (!) 45   Temp 97.8 F (36.6 C) (Oral)   Resp 20   Ht _0  (1.6 m)   Wt 113 lb 4.8 oz (51.4 kg)   SpO2 94%   BMI 20.07 kg/m   INTAKE/OUTPUT: I/O last 3 completed shifts: In: 2502 [P.O.:2402; IV Piggyback:100] Out: 7875 [Urine:7875]  PHYSICAL EXAMINATION:  General - pleasant, thin Eyes - pupils reactive, wears glasses ENT - no sinus tenderness, no oral exudate, no LAN Cardiac - regular, no murmur Chest - faint basilar crackles, no wheeze Abd - soft, non tender Ext - no edema, changes of RA in hands Skin - no rashes Neuro - normal strength Psych - normal mood   CMP Latest Ref Rng & Units 06/17/2017 06/16/2017 06/15/2017  Glucose 65 - 99 mg/dL 114(H) 195(H) 226(H)  BUN 6 - 20 mg/dL 24(H) 22(H) 25(H)  Creatinine 0.61 - 1.24 mg/dL 0.72 0.75 0.75  Sodium 135 - 145 mmol/L 134(L) 136 132(L)  Potassium 3.5 - 5.1 mmol/L 3.8 3.9 3.3(L)  Chloride 101 - 111 mmol/L 89(L) 93(L) 90(L)  CO2 22 - 32 mmol/L 37(H) 36(H) 33(H)  Calcium 8.9 - 10.3 mg/dL 8.6(L) 8.5(L) 8.1(L)  Total Protein 6.5 - 8.1 g/dL 5.4(L) - -  Total Bilirubin 0.3 - 1.2 mg/dL 1.1 - -  Alkaline Phos 38 - 126 U/L 117 - -  AST 15 - 41 U/L 46(H) - -  ALT 17 - 63 U/L 43 - -    CBC Latest Ref Rng & Units 06/16/2017 06/12/2017 06/11/2017  WBC 4.0 - 10.5 K/uL 13.4(H)  8.8 10.6(H)  Hemoglobin 13.0 - 17.0 g/dL 11.6(L) 11.2(L) 11.4(L)  Hematocrit 39.0 - 52.0 % 37.0(L) 34.3(L) 33.5(L)  Platelets 150 - 400 K/uL 205 286 296    CBG (last 3)   Recent Labs  06/16/17 1637 06/16/17 2037 06/17/17 0724  GLUCAP 263* 137* 130*     Dg Chest Port 1 View  Result Date: 06/15/2017 CLINICAL DATA:  Pneumonia.  Shortness of breath EXAM: PORTABLE CHEST 1 VIEW COMPARISON:  Five days ago FINDINGS: Lower lung volumes with progressed bilateral opacity. Haziness at the right base, possible small effusion. Chronic cardiomegaly. No pneumothorax. IMPRESSION: History of pneumonia with lower lung volumes and increased opacity compared to 5 days ago. Electronically Signed   By: Monte Fantasia M.D.   On: 06/15/2017 14:36    CULTURES: Blood 8/24 >> negative Legionella Ag 8/24 >> negative Pneumococcal Ag 8/24 >> negative  ANTIBIOTICS: Cefepime 8/23 >> 8/26 Vancomycin 8/23 >> 8/26 Augmentin 8/27 >> 8/28 Zosyn 8/29 >>   STUDIES: PFT 02/02/17 >> FEV1 2.07 (87%), FEV1% 88, TLC 2.88 (50%), DLCO 35%, no BD CT angio chest 04/29/17 >> moderate emphysema, mild BTX lower lobes, b/l subpleural fibrosis Echo 05/01/17 >> EF 55 to 60%, grade 1 DD, flattening  of ventricular septum, mild AS, severe RA/RV dilation, mod/severe TR, PAS 87 mmHg PSG 05/10/17 >> AHI 1.8  DISCUSSION: 71 yo male former smoker with acute on chronic hypoxic respiratory with worsening pulmonary infiltrates.  He has hx of RA with ILD, emphysema, and metapneumovirus from May 2018.  He reports clinical improvement with current therapies.  Mostly likely has acute respiratory infection and pulmonary edema.  ASSESSMENT / PLAN:  Pulmonary infiltrates. - most likely infectious and pulmonary edema - day 8/10 of Abx, currently on zosyn - f/u CXR - negative fluid balance as able  Hx of RA with ILD. - continue baseline dose of prednisone 20 mg daily - if respiratory status and imaging studies get worse, then consider  changing to IV steroids at higher dose  Pulmonary emphysema. - Prn BDs  Acute on chronic hypoxic respiratory failure with cor pulmonale. - baseline O2 needs 4 liters as outpt - adjust O2 to keep SpO2 > 92%  WHO group 1, 2, 3 pulmonary hypertension. - defer pulmonary hypertension therapies for now - reassess as outpt  A fib with RVR. - per primary team and cardiology  DVT prophylaxis - eliquis SUP - pepcid Nutrition - Carb modified, heart healthy Goals of care - full code  Chesley Mires, MD [place] 06/17/2017, 9:15 AM Pager:  [phone] After 3pm call: [phone]

## 2017-06-17 NOTE — Progress Notes (Signed)
PT Cancellation Note  Patient Details Name: Gilbert Dominguez MRN: [id number] DOB: Mar 26, 1946   Cancelled Treatment:    Reason Eval/Treat Not Completed: Medical issues which prohibited therapy Pt HR in mid 120s at rest. Will follow up as medically appropriate and as schedule allows.   Leighton Ruff, PT, DPT  Acute Rehabilitation Services  Pager: [phone]    Gilbert Dominguez 06/17/2017, 11:06 AM

## 2017-06-17 NOTE — Care Management Important Message (Signed)
Important Message  Patient Details  Name: Gilbert Dominguez MRN: [id number] Date of Birth: Aug 08, 1946   Medicare Important Message Given:  Yes    [name] May 06/17/2017, 9:37 AM

## 2017-06-18 ENCOUNTER — Inpatient Hospital Stay (HOSPITAL_COMMUNITY): Payer: Medicare Other

## 2017-06-18 LAB — GLUCOSE, CAPILLARY
GLUCOSE-CAPILLARY: 144 mg/dL — AB (ref 65–99)
Glucose-Capillary: 91 mg/dL (ref 65–99)
Glucose-Capillary: 318 mg/dL — ABNORMAL HIGH (ref 65–99)
Glucose-Capillary: 150 mg/dL — ABNORMAL HIGH (ref 65–99)

## 2017-06-18 LAB — MAGNESIUM: MAGNESIUM: 1.9 mg/dL (ref 1.7–2.4)

## 2017-06-18 LAB — DIGOXIN LEVEL: Digoxin Level: 1.4 ng/mL (ref 0.8–2.0)

## 2017-06-18 MED ORDER — AMOXICILLIN-POT CLAVULANATE 875-125 MG PO TABS
1.0000 | ORAL_TABLET | Freq: Two times a day (BID) | ORAL | Status: DC
  Administered 2017-06-18 – 2017-06-19 (×2): 1 via ORAL
  Filled 2017-06-18 (×2): qty 1

## 2017-06-18 NOTE — Progress Notes (Signed)
  Speech Language Pathology Treatment: Dysphagia  Patient Details Name: Gilbert Dominguez MRN: [id number] DOB: 02/11/1946 Today's Date: 06/18/2017 Time: 2951-8841 SLP Time Calculation (min) (ACUTE ONLY): 24 min  Assessment / Plan / Recommendation Clinical Impression  Pt seen to assess po intake and readiness for instrumental swallow evaluation.  Pt sitting fully upright in chair with clear voice and ability to speak in complete sentences.  Observed pt with breakfast including corn muffin, tea and fruit.  No overt indication of aspiration - swallow was timely and no increased work of breathing.  Intermittent throat clear observed during intake and mild hoarseness noted.    Upon interview with pt, he reports he does occasionally have coughing during intake toward end of meal.  In addition, he complains of globus and sensing residuals in pharynx.  He admits to episode of choking just sitting in a chair in February prompting hospital admit.  Suspect pt may have esophageal dysphagia based on his symptoms, however would recommend MBS to assure pt is not overtly silently aspirating.  On schedule for 0930, pt and MD agreeable to plan.   HPI HPI: Pt is a 71 y/o male admitted with worsening right-sided PNA. PMH includes pulmonary fibrosis on 4L of oxygen, pulmonary HTN, RA, CHF, and DM. He had a BSE in February 2018 with normal appearing oropharyngeal swallow and recommendations for regular diet and thin liquids.       SLP Plan  Continue with current plan of care       Recommendations  Diet recommendations: Regular;Thin liquid Liquids provided via: Cup Supervision: Patient able to self feed Compensations: Slow rate;Small sips/bites Postural Changes and/or Swallow Maneuvers: Seated upright 90 degrees;Upright 30-60 min after meal                Oral Care Recommendations: Oral care BID Follow up Recommendations: Other (comment) (TBD) SLP Visit Diagnosis: Dysphagia, unspecified (R13.10) Plan:  Continue with current plan of care       GO               Gilbert Dominguez, [facility] SLP [phone]  Gilbert Dominguez 06/18/2017, 8:09 AM

## 2017-06-18 NOTE — Progress Notes (Signed)
Modified Barium Swallow Progress Note  Patient Details  Name: Gilbert Dominguez MRN: [id number] Date of Birth: 26-Nov-1945  Today's Date: 06/18/2017  Modified Barium Swallow completed.  Full report located under Chart Review in the Imaging Section.  Brief recommendations include the following:  Clinical Impression  Pt presents with functional oropharyngeal swallow ability without aspiration or penetration of any consistency tested *barium tablet, cracker, puree, nectar, thin) His oral transiting was timely with all boluses except barium tablet - with which he required puree to transit.  No significant oropharyngeal residuals present during testing and pt's respiratory status remained stable.    Appearance of prominent cp with minimal barium residual near region.   Upon esophageal sweep after swallowing tablet with pudding- pt appeared with stasis distally and retrograde propulsion of thin barium given to faciliate clearance.  Water consumption appeared to adequately clear esophagus.    Radiologist was not present to confirm.   Suspect primary risk is esophageal and precautions reviewed live with pt and provided in writing.   Pt's sensation of globus and lodging in pharynx is likely referrant to distal esophagus due to vagus nerve.  Will follow up x1 given tenuous respiratory status.    Swallow Evaluation Recommendations       SLP Diet Recommendations: Regular solids;Thin liquid   Liquid Administration via: Cup;Straw   Medication Administration: Whole meds with liquid   Supervision: Patient able to self feed   Compensations: Small sips/bites;Follow solids with liquid;Other (Comment) (start meals with liquids)   Postural Changes: Remain semi-upright after after feeds/meals (Comment);Seated upright at 90 degrees   Oral Care Recommendations: Oral care BID      Luanna Salk, MS [facility] SLP [phone]   [name] 06/18/2017,10:27 AM

## 2017-06-18 NOTE — Progress Notes (Addendum)
Name: Gilbert Dominguez MRN: [id number] DOB: 05-Nov-1945    ADMISSION DATE:  06/10/2017 CONSULTATION DATE:  8/29  REFERRING MD :  Verlon Au ([facility])   CHIEF COMPLAINT:  ILD   BRIEF PATIENT DESCRIPTION:   71 yo male former smoker presented to ER with dyspnea.  Found to have progressive pulmonary infiltrate, worsening hypoxia, and a fib with RVR.  Hx of RA with pulmonary fibrosis, emphysema, pulmonary HTN, diastolic CHF, and chronic respiratory failure on 4 liters oxygen at home.   SUBJECTIVE:  Continues to feel better. States he is breathing better, continued to get SOB with exertion > than home baseline. VITAL SIGNS: BP 108/70 (BP Location: Left Arm)   Pulse 65   Temp 97.6 F (36.4 C) (Axillary)   Resp 16   Ht _0  (1.6 m)   Wt 108 lb 12.8 oz (49.4 kg)   SpO2 93%   BMI 19.27 kg/m   INTAKE/OUTPUT: I/O last 3 completed shifts: In: 2370 [P.O.:2120; IV Piggyback:250] Out: 5462 [Urine:6225]  PHYSICAL EXAMINATION:  General - pleasant male supine in bed. Eyes - pupils reactive, wears glasses ENT - no sinus tenderness, no oral exudate, no LAN Cardiac - RRR, no murmur, rub, gallop Chest - faint basilar crackles, no wheezing. Abd - soft, non tender, non-distended Ext - no edema, changes of RA in hands Skin - warm , dry and intact, no rashes or lesions Neuro - normal strength, deconditioned at baseline Psych - normal mood   CMP Latest Ref Rng & Units 06/17/2017 06/16/2017 06/15/2017  Glucose 65 - 99 mg/dL 114(H) 195(H) 226(H)  BUN 6 - 20 mg/dL 24(H) 22(H) 25(H)  Creatinine 0.61 - 1.24 mg/dL 0.72 0.75 0.75  Sodium 135 - 145 mmol/L 134(L) 136 132(L)  Potassium 3.5 - 5.1 mmol/L 3.8 3.9 3.3(L)  Chloride 101 - 111 mmol/L 89(L) 93(L) 90(L)  CO2 22 - 32 mmol/L 37(H) 36(H) 33(H)  Calcium 8.9 - 10.3 mg/dL 8.6(L) 8.5(L) 8.1(L)  Total Protein 6.5 - 8.1 g/dL 5.4(L) - -  Total Bilirubin 0.3 - 1.2 mg/dL 1.1 - -  Alkaline Phos 38 - 126 U/L 117 - -  AST 15 - 41 U/L 46(H) - -  ALT 17 - 63  U/L 43 - -    CBC Latest Ref Rng & Units 06/16/2017 06/12/2017 06/11/2017  WBC 4.0 - 10.5 K/uL 13.4(H) 8.8 10.6(H)  Hemoglobin 13.0 - 17.0 g/dL 11.6(L) 11.2(L) 11.4(L)  Hematocrit 39.0 - 52.0 % 37.0(L) 34.3(L) 33.5(L)  Platelets 150 - 400 K/uL 205 286 296    CBG (last 3)   Recent Labs  06/17/17 1709 06/17/17 2053 06/18/17 0736  GLUCAP 236* 130* 91     Dg Chest 2 View  Result Date: 06/17/2017 CLINICAL DATA:  Pneumonia. Shortness of breath. Congestive heart failure and pulmonary fibrosis. EXAM: CHEST  2 VIEW COMPARISON:  06/15/2017 FINDINGS: Stable cardiomegaly. There has been mild improvement in bilateral heterogeneous airspace disease since previous study. Underlying chronic bibasilar interstitial lung disease again noted. Aortic atherosclerosis. IMPRESSION: Mild improvement in bilateral heterogeneous airspace disease since previous study. Stable cardiomegaly and underlying chronic interstitial lung disease. Electronically Signed   By: Earle Gell M.D.   On: 06/17/2017 09:25    CULTURES: Blood 8/24 >> negative Legionella Ag 8/24 >> negative Pneumococcal Ag 8/24 >> negative  ANTIBIOTICS: Cefepime 8/23 >> 8/26 Vancomycin 8/23 >> 8/26 Augmentin 8/27 >> 8/28 Zosyn 8/29 >>   STUDIES: PFT 02/02/17 >> FEV1 2.07 (87%), FEV1% 88, TLC 2.88 (50%), DLCO 35%, no BD CT angio  chest 04/29/17 >> moderate emphysema, mild BTX lower lobes, b/l subpleural fibrosis Echo 05/01/17 >> EF 55 to 60%, grade 1 DD, flattening of ventricular septum, mild AS, severe RA/RV dilation, mod/severe TR, PAS 87 mmHg PSG 05/10/17 >> AHI 1.8  DISCUSSION: 71 yo male former smoker with acute on chronic hypoxic respiratory with worsening pulmonary infiltrates.  He has hx of RA with ILD, emphysema, and metapneumovirus from May 2018.  He reports clinical improvement with current therapies.  Mostly likely has acute respiratory infection and pulmonary edema.  ASSESSMENT / PLAN:  Pulmonary infiltrates. - most likely  infectious and pulmonary edema>> better with diuresis - day 9/10 of Abx, currently on zosyn - f/u CXR - negative fluid balance as able  Hx of RA with ILD. - continue baseline dose of prednisone 20 mg daily - if respiratory status and imaging studies get worse, then consider changing to IV steroids at higher dose  Pulmonary emphysema. - Prn BDs  Acute on chronic hypoxic respiratory failure with cor pulmonale. - baseline O2 needs 4 liters as outpt - adjust O2 to keep SpO2 > 92%  WHO group 1, 2, 3 pulmonary hypertension. - defer pulmonary hypertension therapies for now - reassess as outpt  A fib with RVR. - per primary team and cardiology  DVT prophylaxis - eliquis SUP - pepcid Nutrition - Carb modified, heart healthy Goals of care - full code  Magdalen Spatz, AGACNP-BC [place] 06/18/2017, 10:14 AM Pager:  [phone]   Independently examined pt, evaluated data & formulated above care plan with NP   71 year old with severe pulmonary hypertension related to ILD and rheumatoid arthritis He has chronic respiratory failure maintained on 4 L oxygen. He is improved after diuresis and antibiotics  On  exam-her basal crackles, no clubbing, no JVD 1+ peripheral edema  Impression/plan- He appears close to his baseline, prednisone has been decreased to 20 mg, completed 10 days of IV antibiotics He does have acute cor pulmonale from severe pulmonary hypertension and ideally needs a right heart cath to assess, but has significant secondary causes with ILD and cardiac disease  [facility] M available as needed Rigoberto Noel MD

## 2017-06-18 NOTE — Progress Notes (Signed)
PROGRESS NOTE   Gilbert Knoblock  WLS:[id number]    DOB: 03-05-1946    DOA: 06/10/2017  PCP: Leonard Downing, MD   I have briefly reviewed patients previous medical records in [facility].  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  Chronic leg edema.   In ED, hypoxic in the 80s on 4 L oxygen, blood pressure 90/70, chest x-ray concerning for pneumonia superimposed on chronic lung disease, sodium 118 down from 134 week ago, creatinine 1.65.   Admitted to step down for evaluation and management of acute on chronic hypoxic respiratory failure suspected secondary to healthcare associated pneumonia and for hyponatremia.Patient was clinically improving.   However on night of 8/26, went into A. fib with RVR in the 140s (in the context of home Cardizem dose being marginally reduced due to soft blood pressures). Cardiology consulted on 8/27.   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 Aspiration pneumonia: Treated as HCAP initally with vancomycin and cefepime. Urine pneumococcal antigen negative. Urine Legionella antigen: Negative, blood cultures: Negative to date. HIV screen nonreactive. Improving. Since cultures negative MRSA PCR  negative, DC vancomycin. Transitioned to oral Augmentin on 8/27--CXR 1 vw 8/28 ? Progression and concrns for aspiration--tells me has been coughing while eating--wife confirms--have switched back to IV abx Zosyn from Augmentin 8/29 --we will make another change back to Augmentin today 8/31 and observe 2. Acute on chronic hypoxic respiratory failure: labile dyspnea --monitor  3. Hypervolemic Hyponatremia: Sodium 118 on admission, down from 134 on 8/16.--resolving Resumed home dose of Bumex. Sodium and fluid restriction. 4. Acute kidney injury: Presented with serum creatinine of 1.65, 0.88 on 8/16. Monitor BMP while on Bumex. Currently 24/0.72 Atrial flutter with RVR: Rate controlled. CHADS-VASc is 4 Continue Eliquis-Cardizem from 360 to 300 MG daily 8/26, went into A. Fib/flutter with RVR in the 140s and was started on Cardizem infusion. Cardiology + EP --? dose cardizem back to 360 8/30. On digoxin 0.25-dig level 1.4---could potentially d/c if Arryh stable over 48 hours 5. 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 Resumed Bumex.  predominantly right heart failure due to pulmonary hypertension.  Weight down from 132 pounds on 8/24 > 122 pounds on 8/27-->114--->113. -17 L since admission.  Not a candidate for advanced therapies for PAH--not a great candidate for RHC either 6. DM 2: A1c 6.9 in July 2018. SSI. Holding metformin. eatign sugars sweets at bedside-Added Lantus 5 units daily. Now that his steroids have been switched to by mouth, expect to improve. CBGs 130-188--increasing 8/29 to 10 U lantus 7. Interstitial lung disease, Rheum lung?: Oral prednisone was switched to IV steroids> switched back to home PO Prednisone starting 8/26. Will  defer to CCM adding back IV--not sure if with this advanced lung disease will make overall difference 8. Anemia: Stable 9. Transaminitis:? Hepatic congestion. Follow LFTs,  slightly better.   DVT prophylaxis: Eliquis Code Status: DNR Family Communication: None at bedside--talked to wife 8/29 last--will update in am 9/1 Disposition: DC home when medically improved. Not medically ready for discharge.   Consultants:  Cardiology  Procedures:  BiPAP  Antimicrobials:  Cefepime and vancomycin-discontinued.  Oral Augmentin 8/27 >   Subjective:  Fair looks well Feels strong no new issue No cp Coughing less   Objective:  Vitals:   06/18/17 0457 06/18/17 0535 06/18/17 0823 06/18/17 1314  BP: 98/73  108/70   Pulse: 74  65   Resp: 16     Temp: 97.7 F (36.5 C)  97.6 F (36.4 C) 98.1 F (36.7 C)  TempSrc:   Axillary Oral  SpO2: 100%  93%   Weight:  49.4 kg (108 lb 12.8 oz)    Height:        Examination:  Awake alert smiling Less WOB Less tachy, s1 s 2 rrr Decreased post crackles abd soft nt nd no rebound Trace le edema Neuro intact, psych intact   Data Reviewed: I have personally reviewed following labs and imaging studies  CBC:  Recent Labs Lab 06/12/17 0213 06/16/17 0131  WBC 8.8 13.4*  NEUTROABS  --  11.7*  HGB 11.2* 11.6*  HCT 34.3* 37.0*  MCV 88.4 91.6  PLT 286 147   Basic Metabolic Panel:  Recent Labs Lab 06/13/17 0348 06/14/17 0525 06/14/17 1625 06/15/17 0159 06/16/17 0131 06/17/17 0317  NA 131* 133*  --  132* 136 134*  K 3.6 3.0* 4.1 3.3* 3.9 3.8  CL 94* 93*  --  90* 93* 89*  CO2 26 32  --  33* 36* 37*  GLUCOSE 257* 80  --  226* 195* 114*  BUN 45* 28*  --  25* 22* 24*  CREATININE 1.02 0.75  --  0.75 0.75 0.72  CALCIUM 8.3* 8.2*  --  8.1* 8.5* 8.6*  MG  --   --   --   --  1.5* 1.6*   Liver Function Tests:  Recent Labs Lab 06/14/17 0525 06/17/17 0317  AST 49* 46*  ALT 54 43  ALKPHOS 106 117  BILITOT 1.1 1.1  PROT 5.1* 5.4*  ALBUMIN 2.5* 2.4*   Coagulation Profile: No results for input(s): INR, PROTIME in the last 168 hours. Cardiac Enzymes:  Recent Labs Lab 06/16/17 0131 06/16/17 0635  06/16/17 1328  TROPONINI 0.10* 0.10* 0.09*   HbA1C: No results for input(s): HGBA1C in the last 72 hours. CBG:  Recent Labs Lab 06/17/17 1709 06/17/17 2053 06/18/17 0736 06/18/17 1116 06/18/17 1608  GLUCAP 236* 130* 91 318* 150*    Recent Results (from the past 240 hour(s))  Culture, blood (routine x 2) Call MD if unable to obtain prior to antibiotics being given     Status: None   Collection Time: 06/11/17  8:55 AM  Result Value Ref Range Status   Specimen Description BLOOD LEFT ANTECUBITAL  Final   Special Requests   Final    BOTTLES DRAWN AEROBIC AND ANAEROBIC Blood Culture adequate volume   Culture NO GROWTH 5 DAYS  Final   Report Status 06/16/2017 FINAL  Final  Culture, blood (routine x 2) Call MD if unable to obtain prior to antibiotics being given     Status: None   Collection Time: 06/11/17  9:04 AM  Result  Value Ref Range Status   Specimen Description BLOOD LEFT WRIST  Final   Special Requests IN PEDIATRIC BOTTLE Blood Culture adequate volume  Final   Culture NO GROWTH 5 DAYS  Final   Report Status 06/16/2017 FINAL  Final     Radiology Studies: Dg Chest 2 View  Result Date: 06/17/2017 CLINICAL DATA:  Pneumonia. Shortness of breath. Congestive heart failure and pulmonary fibrosis. EXAM: CHEST  2 VIEW COMPARISON:  06/15/2017 FINDINGS: Stable cardiomegaly. There has been mild improvement in bilateral heterogeneous airspace disease since previous study. Underlying chronic bibasilar interstitial lung disease again noted. Aortic atherosclerosis. IMPRESSION: Mild improvement in bilateral heterogeneous airspace disease since previous study. Stable cardiomegaly and underlying chronic interstitial lung disease. Electronically Signed   By: Earle Gell M.D.   On: 06/17/2017 09:25   Dg Swallowing Func-speech Pathology  Result Date: 06/18/2017 Objective Swallowing Evaluation: Type of Study: MBS-Modified Barium Swallow Study Patient Details Name: Gilbert Dominguez MRN: [id number]  Date of Birth: [birth year] Today's Date: 06/18/2017 Time: SLP Start Time (ACUTE ONLY): 0938-SLP Stop Time (ACUTE ONLY): 0955 SLP Time Calculation (min) (ACUTE ONLY): 17 min Past Medical History: Past Medical History: Diagnosis Date . Atrial flutter ([place])   a. initially diagnosed in 04/2017, started on Eliquis b. recurrent in 05/2017 . CHF exacerbation ([place]) 02/24/2017 . Chronic respiratory failure ([place])  . Hypertension  . 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 Rheumatoid arthritis(714.0)  Past Surgical History: Past Surgical History: Procedure Laterality Date . ACNE CYST REMOVAL  1968 . VASECTOMY  1973 HPI: Pt is a 71 y/o male admitted with worsening right-sided PNA. PMH includes pulmonary fibrosis on 4L of oxygen, pulmonary HTN, RA, CHF, and DM. He had a BSE in February 2018 with normal appearing oropharyngeal swallow and recommendations for regular diet and thin liquids.  Subjective: pt sitting upright Assessment / Plan / Recommendation CHL IP CLINICAL IMPRESSIONS 06/18/2017 Clinical Impression Pt presents with functional oropharyngeal swallow ability without aspiration or penetration of any consistency tested *barium tablet, cracker, puree, nectar, thin) His oral transiting was timely with all boluses except barium tablet - with which he required puree to transit.  No significant oropharyngeal residuals present during testing and pt's respiratory status remained stable.  Appearance of prominent cp with minimal barium residual near region.   Upon esophageal sweep after swallowing tablet with pudding- pt appeared with stasis distally and retrograde propulsion of thin barium given to faciliate clearance.  Water consumption appeared to adequately clear esophagus.  Radiologist was not present to confirm.   Suspect primary risk is esophageal and precautions reviewed live with pt and provided in writing.   Pt's sensation of globus and lodging in pharynx is likely referrant to distal esophagus due to vagus nerve.  Will  follow up x1 given tenuous respiratory status.  SLP Visit Diagnosis Dysphagia, pharyngoesophageal phase (R13.14) Attention and concentration deficit following -- Frontal lobe and executive function deficit following -- Impact on safety and function Mild aspiration risk   CHL IP TREATMENT RECOMMENDATION 06/16/2017 Treatment Recommendations Therapy as outlined in treatment plan below   Prognosis 06/18/2017 Prognosis for Safe Diet Advancement Good Barriers to Reach Goals -- Barriers/Prognosis Comment -- CHL IP DIET RECOMMENDATION 06/18/2017 SLP Diet Recommendations Regular solids;Thin liquid Liquid Administration via Cup;Straw Medication Administration Whole meds with liquid Compensations Small sips/bites;Follow solids with liquid;Other (Comment) Postural Changes Remain semi-upright after after feeds/meals (Comment);Seated upright at 90 degrees   CHL IP OTHER RECOMMENDATIONS 06/18/2017 Recommended Consults -- Oral Care Recommendations Oral care BID Other Recommendations --  CHL IP FOLLOW UP RECOMMENDATIONS 06/18/2017 Follow up Recommendations Other (comment)   CHL IP FREQUENCY AND DURATION 06/18/2017 Speech Therapy Frequency (ACUTE ONLY) min 1 x/week Treatment Duration 1 week      CHL IP ORAL PHASE 06/18/2017 Oral Phase Impaired Oral - Pudding Teaspoon -- Oral - Pudding Cup -- Oral - Honey Teaspoon -- Oral - Honey Cup -- Oral - Nectar Teaspoon -- Oral - Nectar Cup WFL Oral - Nectar Straw -- Oral - Thin Teaspoon -- Oral - Thin Cup WFL Oral - Thin Straw WFL Oral - Puree WFL Oral - Mech Soft -- Oral - Regular WFL Oral - Multi-Consistency -- Oral - Pill Other (Comment) Oral Phase - Comment --  CHL IP PHARYNGEAL PHASE 06/18/2017 Pharyngeal Phase WFL Pharyngeal- Pudding Teaspoon -- Pharyngeal -- Pharyngeal- Pudding Cup -- Pharyngeal -- Pharyngeal- Honey Teaspoon -- Pharyngeal -- Pharyngeal- Honey Cup -- Pharyngeal -- Pharyngeal- Nectar Teaspoon -- Pharyngeal -- Pharyngeal- Nectar Cup WFL Pharyngeal -- Pharyngeal- Nectar Straw --  Pharyngeal -- Pharyngeal- Thin Teaspoon -- Pharyngeal -- Pharyngeal- Thin Cup WFL Pharyngeal -- Pharyngeal- Thin Straw WFL Pharyngeal -- Pharyngeal- Puree WFL Pharyngeal -- Pharyngeal- Mechanical Soft -- Pharyngeal -- Pharyngeal- Regular WFL Pharyngeal -- Pharyngeal- Multi-consistency -- Pharyngeal -- Pharyngeal- Pill WFL Pharyngeal -- Pharyngeal Comment --  CHL IP CERVICAL ESOPHAGEAL PHASE 06/18/2017 Cervical Esophageal Phase Impaired Pudding Teaspoon -- Pudding Cup -- Honey Teaspoon -- Honey Cup -- Nectar Teaspoon -- Nectar Cup -- Nectar Straw -- Thin Teaspoon -- Thin Cup -- Thin Straw -- Puree -- Mechanical Soft -- Regular -- Multi-consistency -- Pill -- Cervical Esophageal Comment appearance of prominent cp with minimal barium residual near region, upon esophageal sweep after swallowing tablet with pudding- pt appeared with stasis distally without awareness/sensation, more puree did not help clear - water consumption adequately cleared No flowsheet data found. [name] 06/18/2017, 10:28 AM Luanna Salk, MS [facility] SLP [phone]              Scheduled Meds: . apixaban  5 mg Oral BID  . bumetanide  2 mg Oral BID  . calcium-vitamin D  1 tablet Oral BID  . dextromethorphan-guaiFENesin  1 tablet Oral BID  . digoxin  0.25 mg Oral Daily  . diltiazem  360 mg Oral Daily  . famotidine  20 mg Oral QHS  . feeding supplement (PRO-STAT SUGAR FREE 64)  30 mL Oral BID  . insulin aspart  0-5 Units Subcutaneous QHS  . insulin aspart  0-9 Units Subcutaneous TID WC  . insulin glargine  8 Units Subcutaneous Daily  . loratadine  10 mg Oral Daily  . mouth rinse  15 mL Mouth Rinse BID  . pantoprazole  40 mg Oral Daily  . predniSONE  20 mg Oral Q breakfast  . terazosin  2 mg Oral QHS   Continuous Infusions: . piperacillin-tazobactam (ZOSYN)  IV Stopped (06/18/17 1411)     LOS: 8 days     Verneita Griffes, MD [facility] [phone]

## 2017-06-18 NOTE — Progress Notes (Signed)
PT Cancellation Note  Patient Details Name: Gilbert Dominguez MRN: [id number] DOB: [birth year]   Cancelled Treatment:    Reason Eval/Treat Not Completed: Other (comment) (pt currently working with SLP and unavailable)   Inara Dike B Shai Rasmussen 06/18/2017, 7:52 AM  Elwyn Reach, [employer]

## 2017-06-18 NOTE — Progress Notes (Signed)
Surprisingly in normal sinus rhythm with PACs today. Rate is much slower and consistent. If sinus rhythm is consistently maintained, would probably recommend discontinuing the digoxin due to its high risk of toxicity, but can restart it if he has recurrent arrhythmia. Noted plans for palliative care. Please reconsult over the weekend as needed.  Sanda Klein, MD, [facility] [phone] office [phone] pager

## 2017-06-18 NOTE — Progress Notes (Signed)
Physical Therapy Treatment Patient Details Name: Gilbert Dominguez MRN: [id number] DOB: [birth year] Today's Date: 06/18/2017    History of Present Illness Pt is a 71 y/o male admitted secondary to HCAP. PMH includes pulmonary fibrosis on 4L of oxygen, pulmonary HTN, RA, CHF, and DM.     PT Comments    Pt pleasant and willing to mobilize. Pt reports son is moving in with him to provide 24hr care. Discussed SNF option with pt who states he would be willing to do that after a visit to [place] on the 10th of this month and explained process to pt. Pt with very limited functional mobility, he desaturates quickly with increased oxygen delivery and will require assist for ADLs and function at home. If son truly able to help then return home is feasible for pt. Educated pt for HEP and progression of function. Will continue to follow.   92% on 5L at rest with drop to 84% on 8L with gait   Follow Up Recommendations  Home health PT;Supervision/Assistance - 24 hour     Equipment Recommendations  3in1 (PT)    Recommendations for Other Services       Precautions / Restrictions Precautions Precautions: Fall Precaution Comments: watch  02 Restrictions Weight Bearing Restrictions: No    Mobility  Bed Mobility Overal bed mobility: Modified Independent             General bed mobility comments: use of rail and increased time  Transfers Overall transfer level: Needs assistance   Transfers: Sit to/from Stand Sit to Stand: Min guard         General transfer comment: cues for safety  Ambulation/Gait Ambulation/Gait assistance: Min guard Ambulation Distance (Feet): 35 Feet Assistive device: Rolling walker (2 wheeled) Gait Pattern/deviations: Step-through pattern;Decreased stride length;Trunk flexed   Gait velocity interpretation: Below normal speed for age/gender General Gait Details: cues for posture, breathing technique, activity regulation and awareness of vital signs. Pt  reports bumping O2 to 8L at home with gait and with an attempt at this today sats still dropping to 84% by walking to door. Standing rest without recovery of sats and return to chair    Stairs            Wheelchair Mobility    Modified Rankin (Stroke Patients Only)       Balance Overall balance assessment: Needs assistance   Sitting balance-Leahy Scale: Good       Standing balance-Leahy Scale: Poor                              Cognition Arousal/Alertness: Awake/alert Behavior During Therapy: WFL for tasks assessed/performed Overall Cognitive Status: Impaired/Different from baseline Area of Impairment: Safety/judgement                         Safety/Judgement: Decreased awareness of deficits            Exercises General Exercises - Lower Extremity Long Arc Quad: AROM;Both;Seated;20 reps Hip Flexion/Marching: AROM;Both;Seated;20 reps    General Comments        Pertinent Vitals/Pain Pain Assessment: No/denies pain    Home Living                      Prior Function            PT Goals (current goals can now be found in the care plan section) Progress towards PT goals: Progressing  toward goals    Frequency           PT Plan Current plan remains appropriate    Co-evaluation              AM-PAC PT "6 Clicks" Daily Activity  Outcome Measure  Difficulty turning over in bed (including adjusting bedclothes, sheets and blankets)?: A Little Difficulty moving from lying on back to sitting on the side of the bed? : A Little Difficulty sitting down on and standing up from a chair with arms (e.g., wheelchair, bedside commode, etc,.)?: A Little Help needed moving to and from a bed to chair (including a wheelchair)?: A Little Help needed walking in hospital room?: A Little Help needed climbing 3-5 steps with a railing? : A Lot 6 Click Score: 17    End of Session Equipment Utilized During Treatment: Oxygen Activity  Tolerance: Treatment limited secondary to medical complications (Comment) Patient left: in chair;with call bell/phone within reach;with nursing/sitter in room Nurse Communication: Mobility status PT Visit Diagnosis: Other abnormalities of gait and mobility (R26.89);Difficulty in walking, not elsewhere classified (R26.2);Muscle weakness (generalized) (M62.81)     Time: 3419-3790 PT Time Calculation (min) (ACUTE ONLY): 25 min  Charges:  $Gait Training: 8-22 mins $Therapeutic Exercise: 8-22 mins                    G Codes:       Elwyn Reach, PT [phone]   [place] 06/18/2017, 12:26 PM

## 2017-06-19 ENCOUNTER — Inpatient Hospital Stay (HOSPITAL_COMMUNITY): Payer: Medicare Other

## 2017-06-19 LAB — GLUCOSE, CAPILLARY
GLUCOSE-CAPILLARY: 125 mg/dL — AB (ref 65–99)
Glucose-Capillary: 208 mg/dL — ABNORMAL HIGH (ref 65–99)

## 2017-06-19 LAB — BASIC METABOLIC PANEL
Anion gap: 13 (ref 5–15)
BUN: 25 mg/dL — AB (ref 6–20)
CHLORIDE: 86 mmol/L — AB (ref 101–111)
CO2: 37 mmol/L — ABNORMAL HIGH (ref 22–32)
Calcium: 9.1 mg/dL (ref 8.9–10.3)
Creatinine, Ser: 0.75 mg/dL (ref 0.61–1.24)
GFR calc Af Amer: >60 mL/min (ref >60)
GFR calc non Af Amer: >60 mL/min (ref >60)
GLUCOSE: 141 mg/dL — AB (ref 65–99)
POTASSIUM: 3 mmol/L — AB (ref 3.5–5.1)
Sodium: 136 mmol/L (ref 135–145)

## 2017-06-19 LAB — DIGOXIN LEVEL: Digoxin Level: 1.2 ng/mL (ref 0.8–2.0)

## 2017-06-19 MED ORDER — PREDNISONE 20 MG PO TABS: 20.0000 mg | ORAL_TABLET | Freq: Every day | ORAL | 0 refills | Status: AC

## 2017-06-19 MED ORDER — INSULIN GLARGINE 100 UNIT/ML ~~LOC~~ SOLN: 8.0000 [IU] | Freq: Every day | SUBCUTANEOUS | 11 refills | Status: AC

## 2017-06-19 MED ORDER — DIGOXIN 250 MCG PO TABS: 0.2500 mg | ORAL_TABLET | Freq: Every day | ORAL | 0 refills | Status: AC

## 2017-06-19 MED ORDER — POTASSIUM CHLORIDE CRYS ER 20 MEQ PO TBCR
40.0000 meq | EXTENDED_RELEASE_TABLET | Freq: Every day | ORAL | Status: DC
  Administered 2017-06-19: 40 meq via ORAL
  Filled 2017-06-19: qty 2

## 2017-06-19 MED ORDER — AMOXICILLIN-POT CLAVULANATE 875-125 MG PO TABS: 1.0000 | ORAL_TABLET | Freq: Two times a day (BID) | ORAL | 0 refills | Status: DC

## 2017-06-19 NOTE — Progress Notes (Signed)
Patient given discharge instructions. Educated on new medications and a few changes on current ones. Patient verbalized understanding. Also stated he would quit smoking. PIV removed. Patient has home oxygen at bedside. Patient escorted via wheelchair by nurse tech.

## 2017-06-19 NOTE — Discharge Summary (Signed)
Physician Discharge Summary  Gilbert Dominguez EVO:[id number] DOB: January 19, 1946 DOA: 06/10/2017  PCP: Leonard Downing, MD  Admit date: 06/10/2017 Discharge date: 06/19/2017  Time spent: 40 minutes  Recommendations for Outpatient Follow-up:  1. 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 Smoking cessation is paramount-he is aware 3. He was placed on high risk medications such as digoxin which will need to be tapered as an outpatient under the guidance of cardiology-he will need a magnesium in about one week and follow-up basic metabolic panel at that time 4. I would recommend close follow-up with his primary pulmonologist who I will cc on this note 5. He will continue his 20 L of nasal oxygen and home health has been ordered for him on discharge as he does not wish to pursue skilled nursing facility placement and wishes to go home with 24 hour assistance 6. 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)   CKD (chronic kidney disease) stage 3, GFR 30-59 ml/min   Chronic diastolic CHF (congestive heart failure) (HCC)   Chronic atrial flutter (HCC)   Hyponatremia   Acute kidney injury ([place])   Typical atrial flutter ([place])   Discharge Condition: improved  Diet recommendation: heart healthy + cardiac  Filed Weights   06/17/17 0425 06/18/17 0535 06/19/17 0434  Weight: 51.4 kg (113 lb 4.8 oz) 49.4 kg (108 lb 12.8 oz) 49.9 kg (110 lb)    History of present illness:   71 wm pulmonary  fibrosis, chronic hypoxic respiratory failure on home oxygen 4 L/m,  ongoing tobacco abuse,  chronic diastolic CHF, RA and 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  Chronic leg edema.   In ED, hypoxic in the 80s on 4 L oxygen, blood pressure 90/70, chest x-ray concerning for pneumonia superimposed on chronic lung disease, sodium 118 down from 134 week ago, creatinine 1.65.   Admitted to step down for evaluation and management of acute on chronic hypoxic respiratory failure suspected secondary to healthcare associated pneumonia and for hyponatremia.Patient was clinically improving.   However on night of 8/26, went into A. fib with RVR in the 140s (in the context of home Cardizem dose being marginally reduced due to soft blood pressures). Cardiology consulted on 8/27.  Hospital Course:  1. Aspiration pneumonia: Treated as HCAP initally with vancomycin and cefepime. Urine pneumococcal antigen negative. Urine Legionella antigen: Negative, blood cultures: Negative to date. HIV screen nonreactive. Improving. Since cultures negative MRSA PCR negative, DC vancomycin. Transitioned to oral Augmentin on 8/27--CXR 1 vw 8/28 ? Progression and concrns for aspiration--tells me has been coughing while eating--SLP seen, ? Aspiration--Went back and forth between zosyn Augmentin and stabilized finally over the past 48 hours and was sent home to complete a total of 14 days of Augmentin 2. Acute on chronic hypoxic respiratory failure: labile dyspnea --monitor multiple components such as rheumatoid lung and other things as below 3. Hypervolemic Hyponatremia: Sodium 118  on admission, down from 134 on 8/16.--resolving Resumed home dose of Bumex. Sodium and fluid restriction as an outpatient 4. Acute kidney injury: Presented with serum creatinine of 1.65,  0.88 on 8/16. Monitor BMP while on Bumex. Currently 24/0.72, on discharge 25/0.7 13. Atrial flutter with RVR: Rate controlled. CHADS-VASc is 4.  Continue Eliquis-Cardizem from 360 to 300 MG daily 8/26, went into A. Fib/flutter with RVR in the 140s and was started on Cardizem infusion. Cardiology + EP --? dose cardizem back to 360 8/30. On digoxin 0.25-dig level 1.4--->I'm hesitant to discontinue this medication as he is maintaining sinus rhythm on discharge and he will need follow-up with cardiologist to determine if he should require a lower dose versus not I recommended digoxin level in about one week along with a magnesium--I will cc his cardiologist as well 5. 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 Resumed Bumex.  predominantly right heart failure due to pulmonary hypertension.  Weight down from 132 pounds on 8/24 > 122 pounds on 8/27-->114--->113. -15 L since admission.  Not a candidate for advanced therapies for PAH--not a great candidate for RHC either.  he will continue to see his pulmonologist who will recommend further medications as below 6. DM 2: A1c 6.9 in July 2018. SSI. Holding metformin. eatign sugars sweets at bedside-Added Lantus 5 units daily. Now that his steroids have been switched to by mouth, expect to improve. CBGs 130-188--increasing 8/29 to 10 U lantus 7. Interstitial lung disease, Rheum lung?: Oral prednisone was switched to IV steroids> switched back to home PO Prednisone starting 8/26. Will defer to CCM adding back IV--he will discharge on chronic prednisone dosing as per home dosing and will need close follow-up with pulmonology who I will cc on his note 8. Anemia: Stable 9. Transaminitis:? Hepatic congestion. Follow LFTs, slightly better.  Consultants:  Cardiology  Procedures:  BiPAP  Antimicrobials:  Cefepime and vancomycin-discontinued.  Oral Augmentin 8/27  >   Discharge Exam: Vitals:   06/19/17 0434 06/19/17 0928  BP: (!) 85/64 99/71  Pulse: 72   Resp: 14   Temp: 98.4 F (36.9 C)   SpO2:      General: alert pleasant  In good spirits Cardiovascular: s1 s2 no m/r/g--seems sinus Respiratory: crackels no rales no fremitusae good overall  Discharge Instructions   Discharge Instructions    AMB Referral to Phase II Cardiac Rehab    Complete by:  As directed    Diagnosis:  Other   Amb Referral to Palliative Care    Complete by:  As directed    Call MD for:  temperature >100.4    Complete by:  As directed    Diet - low sodium heart healthy    Complete by:  As directed    Discharge instructions    Complete by:  As directed    Please complete the antibiotics for the pneumonia that we found an take all of the medications I would strongly encourage smoking cessation given your already tenuous respiratory situation I would recommend that you follow up with the cardiologist because of some high risk medications that we have started this [place] called digoxin-this is to control your rate and you will need to have lab work in about one week either a primary physician's office or the cardiologist's office-I will wait for cardiologist aware of the need for follow-up Please make sure that you eat and drink very slowly and  clear her throat after eating with couple of sips of fluid and we will get a speech therapist follow-up with you as an outpatient Have a great Labor Day weekend and best of luck to you   Increase activity slowly    Complete by:  As directed      Current Discharge Medication List    START taking these medications   Details  amoxicillin-clavulanate (AUGMENTIN) 875-125 MG tablet Take 1 tablet by mouth 2 (two) times daily. Qty: 8 tablet, Refills: 0    digoxin (LANOXIN) 0.25 MG tablet Take 1 tablet (0.25 mg total) by mouth daily. Qty: 20 tablet, Refills: 0    insulin glargine (LANTUS) 100 UNIT/ML injection Inject 0.08  mLs (8 Units total) into the skin daily. Qty: 10 mL, Refills: 11      CONTINUE these medications which have CHANGED   Details  predniSONE (DELTASONE) 20 MG tablet Take 1 tablet (20 mg total) by mouth daily with breakfast. Qty: 30 tablet, Refills: 0      CONTINUE these medications which have NOT CHANGED   Details  acetaminophen (TYLENOL) 500 MG tablet Take 1,000 mg by mouth every 6 (six) hours as needed for mild pain.    Amino Acids-Protein Hydrolys (FEEDING SUPPLEMENT, PRO-STAT SUGAR FREE 64,) LIQD Take 30 mLs by mouth 2 (two) times daily. Qty: 900 mL, Refills: 0    apixaban (ELIQUIS) 5 MG TABS tablet Take 1 tablet (5 mg total) by mouth 2 (two) times daily. Qty: 60 tablet, Refills: 0    bumetanide (BUMEX) 2 MG tablet Take 1 tablet (2 mg total) by mouth 2 (two) times daily. Qty: 60 tablet, Refills: 0    Calcium Carbonate-Vit D-Min (CALCIUM 600+D PLUS MINERALS PO) Take 1 tablet by mouth 2 (two) times daily.    diltiazem (CARDIZEM CD) 360 MG 24 hr capsule Take 1 capsule (360 mg total) by mouth daily. Qty: 30 capsule, Refills: 6    famotidine (PEPCID) 20 MG tablet Take 1 tablet (20 mg total) by mouth at bedtime. Qty: 90 tablet, Refills: 1    loratadine (CLARITIN) 10 MG tablet Take 10 mg by mouth daily.    metFORMIN (GLUCOPHAGE XR) 500 MG 24 hr tablet Take 2 tabs with breakfast and 2 tabs with supper Qty: 120 tablet, Refills: 0    OXYGEN Inhale 4-8 L into the lungs See admin instructions. 4 liters CONTINUOUSLY and to titrate up to 8 liters CONTINUOUSLY (with exertion) "to keep sats above 90" ([employer])    pantoprazole (PROTONIX) 40 MG tablet TAKE 1 TABLET BY MOUTH EVERY DAY *TAKE 30-60 MINUTES BEFORE FIRST MEAL OF THE DAY* Qty: 30 tablet, Refills: 11    potassium chloride SA (K-DUR,KLOR-CON) 20 MEQ tablet Take 1 tablet (20 mEq total) by mouth daily. Qty: 30 tablet, Refills: 0    terazosin (HYTRIN) 2 MG capsule Take 2 mg by mouth at bedtime.    dextromethorphan-guaiFENesin  (MUCINEX DM) 30-600 MG 12hr tablet Take 1 tablet by mouth 2 (two) times daily.        Allergies  Allergen Reactions  . Lovastatin Rash   Follow-up Information    [facility] Follow up.   Specialty:  [place] Why:  Registered Nurse, Physical Therapy Contact information: [place] [address] [phone]            The results of significant diagnostics from this hospitalization (including imaging, microbiology, ancillary and laboratory) are listed below for reference.    Significant Diagnostic Studies: Dg Chest  2 View  Result Date: 06/17/2017 CLINICAL DATA:  Pneumonia. Shortness of breath. Congestive heart failure and pulmonary fibrosis. EXAM: CHEST  2 VIEW COMPARISON:  06/15/2017 FINDINGS: Stable cardiomegaly. There has been mild improvement in bilateral heterogeneous airspace disease since previous study. Underlying chronic bibasilar interstitial lung disease again noted. Aortic atherosclerosis. IMPRESSION: Mild improvement in bilateral heterogeneous airspace disease since previous study. Stable cardiomegaly and underlying chronic interstitial lung disease. Electronically Signed   By: Earle Gell M.D.   On: 06/17/2017 09:25   Dg Chest 2 View  Result Date: 05/25/2017 CLINICAL DATA:  Acute onset of shortness of breath. Initial encounter. EXAM: CHEST  2 VIEW COMPARISON:  Chest radiograph performed 05/09/2017 FINDINGS: Right midlung and left basilar airspace opacities raise concern for multifocal pneumonia. No definite pleural effusion or pneumothorax is seen. The cardiomediastinal silhouette is mildly enlarged. No acute osseous abnormalities are identified. IMPRESSION: 1. Right midlung and left basilar airspace opacities raise concern for multifocal pneumonia. 2. Mild cardiomegaly. Electronically Signed   By: Garald Balding M.D.   On: 05/25/2017 00:06   US Renal  Result Date: 06/11/2017 CLINICAL DATA:  Acute kidney injury EXAM: RENAL /  URINARY TRACT ULTRASOUND COMPLETE COMPARISON:  None. FINDINGS: Right Kidney: Length: 10 cm. Echogenic appearance. No mass or hydronephrosis visualized. Left Kidney: Length: 10 cm. Subjectively normal echogenicity. Cortical thinning measuring 1 cm. No visible hydronephrosis. Limited visualization due to narrow sonographic windows with no gross mass lesion. Bladder: Appears normal for degree of bladder distention. Small volume ascites seen about the liver. IMPRESSION: 1. Negative for hydronephrosis. 2. Possible medical renal disease. Mild left renal cortical thinning. 3. Small ascites. Electronically Signed   By: Monte Fantasia M.D.   On: 06/11/2017 04:40   Dg Chest Port 1 View  Result Date: 06/19/2017 CLINICAL DATA:  Respiratory failure.  Improved breathing. EXAM: PORTABLE CHEST 1 VIEW COMPARISON:  Two-view chest x-ray 06/17/2017 FINDINGS: Heart is enlarged. Aortic atherosclerosis is again noted. Diffuse interstitial and airspace disease are presents edema or airspace disease superimposed on chronic interstitial disease. Overall aeration pattern is slightly improved. Left greater than right pleural effusions are again noted. Lung volumes are improved. IMPRESSION: 1. Improving aeration in lung volumes bilaterally. 2. Persistent interstitial and airspace disease superimposed on chronic interstitial disease. This likely represents mild edema. Infection is considered less likely. 3. Aortic atherosclerosis. Electronically Signed   By: San Morelle M.D.   On: 06/19/2017 09:43   Dg Chest Port 1 View  Result Date: 06/15/2017 CLINICAL DATA:  Pneumonia.  Shortness of breath EXAM: PORTABLE CHEST 1 VIEW COMPARISON:  Five days ago FINDINGS: Lower lung volumes with progressed bilateral opacity. Haziness at the right base, possible small effusion. Chronic cardiomegaly. No pneumothorax. IMPRESSION: History of pneumonia with lower lung volumes and increased opacity compared to 5 days ago. Electronically Signed   By:  Monte Fantasia M.D.   On: 06/15/2017 14:36   Dg Chest Portable 1 View  Result Date: 05/31/2017 CLINICAL DATA:  Acute onset of shortness of breath and tachycardia. Initial encounter. EXAM: PORTABLE CHEST 1 VIEW COMPARISON:  Chest radiograph performed 05/24/2017 FINDINGS: The lungs are well-aerated. Persistent right basilar and left-sided airspace opacities reflect the patient's known chronic fibrotic change. No superimposed focal airspace consolidation is seen. No pleural effusion or pneumothorax is identified. The cardiomediastinal silhouette is mildly enlarged. No acute osseous abnormalities are seen. IMPRESSION: 1. Chronic fibrotic change again noted, worse on the left. No superimposed focal airspace consolidation seen. 2. Mild cardiomegaly. Electronically Signed   By:  Garald Balding M.D.   On: 05/31/2017 21:56   Dg Abdomen Acute W/chest  Result Date: 06/10/2017 CLINICAL DATA:  Shortness of breath. EXAM: DG ABDOMEN ACUTE W/ 1V CHEST COMPARISON:  May 31, 2017 FINDINGS: Bibasilar opacities are identified. These are not significantly changed since May 31, 2017 but they have increased since April 28, 2017. IMPRESSION: Bibasilar opacities, worsened since April 28, 2017 are thought to represent an acute on chronic process such as infiltrate overlying basilar scarring or fibrosis. No other changes. Electronically Signed   By: Dorise Bullion III M.D   On: 06/10/2017 20:19   Dg Swallowing Func-speech Pathology  Result Date: 06/18/2017 Objective Swallowing Evaluation: Type of Study: MBS-Modified Barium Swallow Study Patient Details Name: Gilbert Dominguez MRN: [id number] Date of Birth: 1946/01/24 Today's Date: 06/18/2017 Time: SLP Start Time (ACUTE ONLY): 0938-SLP Stop Time (ACUTE ONLY): 0955 SLP Time Calculation (min) (ACUTE ONLY): 17 min Past Medical History: Past Medical History: Diagnosis Date . Atrial flutter ([place])   a. initially diagnosed in 04/2017, started on Eliquis b. recurrent in 05/2017 . CHF  exacerbation ([place]) 02/24/2017 . Chronic respiratory failure ([place])  . Hypertension  . 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 Rheumatoid arthritis(714.0)  Past Surgical History: Past Surgical History: Procedure Laterality Date . ACNE CYST REMOVAL  1968 . VASECTOMY  1973 HPI: Pt is a 71 y/o male admitted with worsening right-sided PNA. PMH includes pulmonary fibrosis on 4L of oxygen, pulmonary HTN, RA, CHF, and DM. He had a BSE in February 2018 with normal appearing oropharyngeal swallow and recommendations for regular diet and thin liquids.  Subjective: pt sitting upright Assessment / Plan / Recommendation CHL IP CLINICAL IMPRESSIONS 06/18/2017 Clinical Impression Pt presents with functional oropharyngeal swallow ability without aspiration or penetration of any consistency tested *barium tablet, cracker, puree, nectar, thin) His oral transiting was timely with all boluses except barium tablet - with which he required puree to transit.  No significant oropharyngeal residuals present during testing and pt's respiratory status remained stable.  Appearance of prominent cp with minimal barium residual near region.   Upon esophageal sweep after swallowing tablet with pudding- pt appeared with stasis distally and retrograde propulsion of thin barium given to faciliate clearance.  Water consumption appeared to adequately clear esophagus.  Radiologist was not present to confirm.   Suspect primary risk is esophageal and precautions reviewed live with pt and provided in writing.   Pt's sensation of globus and lodging in pharynx is likely referrant to distal esophagus due to vagus nerve.  Will follow up x1 given tenuous respiratory status.  SLP Visit Diagnosis Dysphagia, pharyngoesophageal phase (R13.14) Attention and concentration deficit following -- Frontal lobe and executive function deficit following -- Impact on safety and function Mild aspiration risk   CHL IP TREATMENT RECOMMENDATION 06/16/2017 Treatment  Recommendations Therapy as outlined in treatment plan below   Prognosis 06/18/2017 Prognosis for Safe Diet Advancement Good Barriers to Reach Goals -- Barriers/Prognosis Comment -- CHL IP DIET RECOMMENDATION 06/18/2017 SLP Diet Recommendations Regular solids;Thin liquid Liquid Administration via Cup;Straw Medication Administration Whole meds with liquid Compensations Small sips/bites;Follow solids with liquid;Other (Comment) Postural Changes Remain semi-upright after after feeds/meals (Comment);Seated upright at 90 degrees   CHL IP OTHER RECOMMENDATIONS 06/18/2017 Recommended Consults -- Oral Care Recommendations Oral care BID Other Recommendations --   CHL IP FOLLOW UP RECOMMENDATIONS 06/18/2017 Follow up Recommendations Other (comment)   CHL IP FREQUENCY AND DURATION 06/18/2017 Speech Therapy Frequency (ACUTE ONLY) min 1 x/week Treatment Duration 1 week  CHL IP ORAL PHASE 06/18/2017 Oral Phase Impaired Oral - Pudding Teaspoon -- Oral - Pudding Cup -- Oral - Honey Teaspoon -- Oral - Honey Cup -- Oral - Nectar Teaspoon -- Oral - Nectar Cup WFL Oral - Nectar Straw -- Oral - Thin Teaspoon -- Oral - Thin Cup WFL Oral - Thin Straw WFL Oral - Puree WFL Oral - Mech Soft -- Oral - Regular WFL Oral - Multi-Consistency -- Oral - Pill Other (Comment) Oral Phase - Comment --  CHL IP PHARYNGEAL PHASE 06/18/2017 Pharyngeal Phase WFL Pharyngeal- Pudding Teaspoon -- Pharyngeal -- Pharyngeal- Pudding Cup -- Pharyngeal -- Pharyngeal- Honey Teaspoon -- Pharyngeal -- Pharyngeal- Honey Cup -- Pharyngeal -- Pharyngeal- Nectar Teaspoon -- Pharyngeal -- Pharyngeal- Nectar Cup WFL Pharyngeal -- Pharyngeal- Nectar Straw -- Pharyngeal -- Pharyngeal- Thin Teaspoon -- Pharyngeal -- Pharyngeal- Thin Cup WFL Pharyngeal -- Pharyngeal- Thin Straw WFL Pharyngeal -- Pharyngeal- Puree WFL Pharyngeal -- Pharyngeal- Mechanical Soft -- Pharyngeal -- Pharyngeal- Regular WFL Pharyngeal -- Pharyngeal- Multi-consistency -- Pharyngeal -- Pharyngeal- Pill WFL  Pharyngeal -- Pharyngeal Comment --  CHL IP CERVICAL ESOPHAGEAL PHASE 06/18/2017 Cervical Esophageal Phase Impaired Pudding Teaspoon -- Pudding Cup -- Honey Teaspoon -- Honey Cup -- Nectar Teaspoon -- Nectar Cup -- Nectar Straw -- Thin Teaspoon -- Thin Cup -- Thin Straw -- Puree -- Mechanical Soft -- Regular -- Multi-consistency -- Pill -- Cervical Esophageal Comment appearance of prominent cp with minimal barium residual near region, upon esophageal sweep after swallowing tablet with pudding- pt appeared with stasis distally without awareness/sensation, more puree did not help clear - water consumption adequately cleared No flowsheet data found. [name] 06/18/2017, 10:28 AM Luanna Salk, [place] [facility] SLP [phone]               Microbiology: Recent Results (from the past 240 hour(s))  Culture, blood (routine x 2) Call MD if unable to obtain prior to antibiotics being given     Status: None   Collection Time: 06/11/17  8:55 AM  Result Value Ref Range Status   Specimen Description BLOOD LEFT ANTECUBITAL  Final   Special Requests   Final    BOTTLES DRAWN AEROBIC AND ANAEROBIC Blood Culture adequate volume   Culture NO GROWTH 5 DAYS  Final   Report Status 06/16/2017 FINAL  Final  Culture, blood (routine x 2) Call MD if unable to obtain prior to antibiotics being given     Status: None   Collection Time: 06/11/17  9:04 AM  Result Value Ref Range Status   Specimen Description BLOOD LEFT WRIST  Final   Special Requests IN PEDIATRIC BOTTLE Blood Culture adequate volume  Final   Culture NO GROWTH 5 DAYS  Final   Report Status 06/16/2017 FINAL  Final     Labs: Basic Metabolic Panel:  Recent Labs Lab 06/14/17 0525 06/14/17 1625 06/15/17 0159 06/16/17 0131 06/17/17 0317 06/18/17 1812 06/19/17 0348  NA 133*  --  132* 136 134*  --  136  K 3.0* 4.1 3.3* 3.9 3.8  --  3.0*  CL 93*  --  90* 93* 89*  --  86*  CO2 32  --  33* 36* 37*  --  37*  GLUCOSE 80  --  226* 195* 114*  --  141*  BUN  28*  --  25* 22* 24*  --  25*  CREATININE 0.75  --  0.75 0.75 0.72  --  0.75  CALCIUM 8.2*  --  8.1* 8.5* 8.6*  --  9.1  MG  --   --   --  1.5* 1.6* 1.9  --    Liver Function Tests:  Recent Labs Lab 06/14/17 0525 06/17/17 0317  AST 49* 46*  ALT 54 43  ALKPHOS 106 117  BILITOT 1.1 1.1  PROT 5.1* 5.4*  ALBUMIN 2.5* 2.4*   No results for input(s): LIPASE, AMYLASE in the last 168 hours. No results for input(s): AMMONIA in the last 168 hours. CBC:  Recent Labs Lab 06/16/17 0131  WBC 13.4*  NEUTROABS 11.7*  HGB 11.6*  HCT 37.0*  MCV 91.6  PLT 205   Cardiac Enzymes:  Recent Labs Lab 06/16/17 0131 06/16/17 0635 06/16/17 1328  TROPONINI 0.10* 0.10* 0.09*   BNP: BNP (last 3 results)  Recent Labs  05/09/17 0430 05/24/17 2334 06/10/17 2016  BNP 2,945.2* 2,092.3* 1,928.5*    ProBNP (last 3 results)  Recent Labs  01/20/17 1642  PROBNP 1,867.0*    CBG:  Recent Labs Lab 06/18/17 0736 06/18/17 1116 06/18/17 1608 06/18/17 2057 06/19/17 0725  GLUCAP 91 318* 150* 144* 125*       Signed:  Nita Sells MD   [facility] 06/19/2017, 9:57 AM

## 2017-06-19 NOTE — Progress Notes (Addendum)
SATURATION QUALIFICATIONS: (This note is used to comply with regulatory documentation for home oxygen)  Patient Saturations on Room Air at Rest = 96  Patient Saturations on Room Air while Ambulating = 83  Patient Saturations on 3 Liters of oxygen while Ambulating = 88%  Patient already has home oxygen. Ambulated to bathroom, became a bit dizzy. Walked back to bed. Oxygen saturation improved upon sitting down. Patient is being discharged, has oxygen tank at bedside. MD Samtani updated. Stated patient can use 4-8 lpm on nasal canula while ambulating.

## 2017-06-19 NOTE — Progress Notes (Signed)
Received call that pt has Oxygen Tank to go home and it is "Empty", Eustace Pen, the rep with [employer] who will trouble shoot with Bedside RN for transport home. CM will sign off for now but will be available should additional discharge needs arise or disposition change.

## 2017-06-25 ENCOUNTER — Other Ambulatory Visit: Payer: Self-pay | Admitting: *Deleted

## 2017-06-25 ENCOUNTER — Other Ambulatory Visit (INDEPENDENT_AMBULATORY_CARE_PROVIDER_SITE_OTHER): Payer: Medicare Other

## 2017-06-25 ENCOUNTER — Encounter: Payer: Self-pay | Admitting: Adult Health

## 2017-06-25 ENCOUNTER — Other Ambulatory Visit: Payer: Self-pay | Admitting: Adult Health

## 2017-06-25 ENCOUNTER — Ambulatory Visit (INDEPENDENT_AMBULATORY_CARE_PROVIDER_SITE_OTHER): Payer: Medicare Other | Admitting: Adult Health

## 2017-06-25 VITALS — BP 124/68 | HR 69 | Ht 63.0 in | Wt 104.4 lb

## 2017-06-25 DIAGNOSIS — J9611 Chronic respiratory failure with hypoxia: Secondary | ICD-10-CM | POA: Diagnosis not present

## 2017-06-25 DIAGNOSIS — J189 Pneumonia, unspecified organism: Secondary | ICD-10-CM

## 2017-06-25 DIAGNOSIS — I4892 Unspecified atrial flutter: Secondary | ICD-10-CM

## 2017-06-25 DIAGNOSIS — I2723 Pulmonary hypertension due to lung diseases and hypoxia: Secondary | ICD-10-CM | POA: Diagnosis not present

## 2017-06-25 DIAGNOSIS — J8410 Pulmonary fibrosis, unspecified: Secondary | ICD-10-CM

## 2017-06-25 DIAGNOSIS — J849 Interstitial pulmonary disease, unspecified: Secondary | ICD-10-CM | POA: Diagnosis not present

## 2017-06-25 DIAGNOSIS — I5032 Chronic diastolic (congestive) heart failure: Secondary | ICD-10-CM | POA: Diagnosis not present

## 2017-06-25 LAB — BASIC METABOLIC PANEL
BUN: 41 mg/dL — AB (ref 6–23)
CO2: 37 mEq/L — ABNORMAL HIGH (ref 19–32)
CREATININE: 1.17 mg/dL (ref 0.40–1.50)
Calcium: 10.1 mg/dL (ref 8.4–10.5)
Chloride: 86 mEq/L — ABNORMAL LOW (ref 96–112)
GFR: 64.95 mL/min (ref >60.00)
GLUCOSE: 111 mg/dL — AB (ref 70–99)
POTASSIUM: 4 meq/L (ref 3.5–5.1)
Sodium: 134 mEq/L — ABNORMAL LOW (ref 135–145)

## 2017-06-25 LAB — DIGOXIN LEVEL: Digoxin Level: 1.2 mcg/L (ref 0.8–2.0)

## 2017-06-25 LAB — MAGNESIUM: MAGNESIUM: 1.5 mg/dL (ref 1.5–2.5)

## 2017-06-25 LAB — BRAIN NATRIURETIC PEPTIDE: Pro B Natriuretic peptide (BNP): 1021 pg/mL — ABNORMAL HIGH (ref 0.0–100.0)

## 2017-06-25 MED ORDER — MAGNESIUM OXIDE 400 MG PO TABS: 400.0000 mg | ORAL_TABLET | Freq: Every day | ORAL | 0 refills | Status: DC

## 2017-06-25 NOTE — Patient Instructions (Addendum)
Labs today  Continue on Prednisone 47m daily .  Continue on 4l/m oxygen rest and 8 l/m activity .  Follow up with  Dr. MLake Bellsas planned  Follow up Dr. WMelvyn Novas In 6 weeks with chest xray and As needed   Please contact office for sooner follow up if symptoms do not improve or worsen or seek emergency care

## 2017-06-25 NOTE — Addendum Note (Signed)
Addended by: Sallye Ober on: 06/25/2017 09:52 AM   Modules accepted: Orders

## 2017-06-25 NOTE — Assessment & Plan Note (Signed)
Clinically improved w/ abx  Check cxr on return in 6 weeks.

## 2017-06-25 NOTE — Assessment & Plan Note (Signed)
COPD /Emphyseam /PF , along with underlying RA  Cont on prednisone 33m daily

## 2017-06-25 NOTE — Addendum Note (Signed)
Addended by: Parke Poisson E on: 06/25/2017 09:48 AM   Modules accepted: Orders

## 2017-06-25 NOTE — Assessment & Plan Note (Signed)
Cont on O2 to keep sats >88-90%  

## 2017-06-25 NOTE — Progress Notes (Signed)
Reviewed, agree 

## 2017-06-25 NOTE — Progress Notes (Signed)
_0  ID: Gilbert Dominguez, male    DOB: 02/09/1946, 70 y.o.   MRN: [id number]  Chief Complaint  Patient presents with  . Follow-up    IPF     Referring provider: Leonard Downing, *  HPI: 71 year old male former smoker, quit 1996, followed for interstitial lung disease with moderate restriction on PFTs. And oxygen dependent respiratory failure on oxygen  And pulmonary hypertension Patient has rheumatoid arthritis. He has atrial flutter/fib  on Eliquis   TEST  pfts 09/27/14        VC 2.2(59%) no obst with dlco 38 corrects to 63 - PFTs 09/04/2014 VC 2.3 (62%) no obst with dlco 38 corrects to 83%  - RA sats 62% 11/23/2013 at rest > started 24h 02 (see chronic respiratory failure)  - 12/12/2014  Walked RA  2 laps @ 185 ft each stopped due to  desat to 84% nl pace  - 11/23/2013 ESR 95 rx pred 20 mg bid  >  12/08/2013 = 14 > rx per [place]  - PFTs 04/11/2015    VC 2.1 s obst and dlco 41 corrects to 75% - ESR 11/19/2015 = 14  - ESR 12/31/2015 =  48 and worse sob/cough > try prednisone 20 mg daily until better and then 10 mg maintenance until follow-up office visit. - PFTs  04/20/2016    VC 2.15 (60%) s obst and dlco 23/22 and corrects to 47%  - 07/21/2016 Referred to rehab > started early Nov 2017 - PFTs 02/02/2017   VC 2.12 (64%) and dlco  35/34 and dlco 46%  -VQ scan 03/2017 >low prob for PE , not suggestive of CTE .  -CTa Chest 04/2017 >neg for PE, mod emphysema, mild bronchiectasis , bilateral subpleural fibrosis.  06/25/2017 Post hospital follow up : PF, O2RF, Pulm HTN  Patient presents for a follow-up. He was recently admitted last week for healthcare associated pneumonia, acute on chronic respiratory failure.  Patient was suspected have aspiration pneumonia. He was treated with aggressive IV antibiotics. And transitioned to Augmentin at discharge. He was seen by cardiology for a flutter with RVR. He was treated with a cardizem drip. And Cardizem oral dose was increased at discharge. Digoxin  was added. Patient has chronic right heart failure secondary to pulmonary hypertension from interstitial lung disease. He was not considered a candidate for advanced therapies for pulmonary hypertension nor a great candidate for right heart catheter. Unfortunately, patient has restarted smoking. Says that he has, not smoked since discharge. Smoking cessation was encouraged. Chest x-ray on September 1 showed improving aeration. Swallowing evaluation showed mild aspiration risk. Recommendations for regular solids and thin liquid. He remains on prednisone 20 mg daily. Patient remains on high flow oxygen at 4l/m rest and 8 L walking with an Oxymizer. Weight is down and leg swelling is better. Wt is down 21lbs since discharge.  Finished Augmentin yesterday .  Wife is on HD. Kids live with them . He is very independent , still drives despite multiple medical issues.  He is feeling better . Still gets very winded with minimal activity . Appetite is picking up . No fever, chest pain , orthopnea, or increased edema.      Allergies  Allergen Reactions  . Lovastatin Rash    Immunization History  Administered Date(s) Administered  . Influenza Split 07/19/2013, 07/19/2014, 07/20/2015  . Influenza, High Dose Seasonal PF 06/01/2016, 06/04/2017  . Pneumococcal Conjugate-13 10/19/2013    Past Medical History:  Diagnosis Date  . Atrial flutter ([place])  a. initially diagnosed in 04/2017, started on Eliquis b. recurrent in 05/2017  . CHF exacerbation ([place]) 02/24/2017  . Chronic respiratory failure ([place])   . Hypertension   . 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 Rheumatoid arthritis(714.0)     Tobacco History: History  Smoking Status  . Former Smoker  . Packs/day: 1.50  . Years: 37.00  . Types: Cigarettes  . Quit date: 10/14/1995  Smokeless Tobacco  . Never Used   Counseling given: Not Answered   Outpatient Encounter Prescriptions as of 06/25/2017  Medication Sig  . acetaminophen  (TYLENOL) 500 MG tablet Take 1,000 mg by mouth every 6 (six) hours as needed for mild pain.  Marland Kitchen apixaban (ELIQUIS) 5 MG TABS tablet Take 1 tablet (5 mg total) by mouth 2 (two) times daily.  . bumetanide (BUMEX) 2 MG tablet Take 1 tablet (2 mg total) by mouth 2 (two) times daily.  . Calcium Carbonate-Vit D-Min (CALCIUM 600+D PLUS MINERALS PO) Take 1 tablet by mouth 2 (two) times daily.  Marland Kitchen dextromethorphan-guaiFENesin (MUCINEX DM) 30-600 MG 12hr tablet Take 1 tablet by mouth 2 (two) times daily.   Marland Kitchen diltiazem (CARDIZEM CD) 360 MG 24 hr capsule Take 1 capsule (360 mg total) by mouth daily.  . famotidine (PEPCID) 20 MG tablet Take 1 tablet (20 mg total) by mouth at bedtime.  . insulin glargine (LANTUS) 100 UNIT/ML injection Inject 0.08 mLs (8 Units total) into the skin daily.  . metFORMIN (GLUCOPHAGE XR) 500 MG 24 hr tablet Take 2 tabs with breakfast and 2 tabs with supper (Patient taking differently: Take 1,000 mg by mouth 2 (two) times daily. )  . OXYGEN Inhale 4-8 L into the lungs See admin instructions. 4 liters CONTINUOUSLY and to titrate up to 8 liters CONTINUOUSLY (with exertion) "to keep sats above 90" ([employer])  . pantoprazole (PROTONIX) 40 MG tablet TAKE 1 TABLET BY MOUTH EVERY DAY *TAKE 30-60 MINUTES BEFORE FIRST MEAL OF THE DAY* (Patient taking differently: TAKE 40MG BY MOUTH EVERY DAY *TAKE 30-60 MINUTES BEFORE FIRST MEAL OF THE DAY*)  . potassium chloride SA (K-DUR,KLOR-CON) 20 MEQ tablet Take 1 tablet (20 mEq total) by mouth daily.  . predniSONE (DELTASONE) 20 MG tablet Take 1 tablet (20 mg total) by mouth daily with breakfast.  . tamsulosin (FLOMAX) 0.4 MG CAPS capsule Take 0.4 mg by mouth daily after supper.  . terazosin (HYTRIN) 2 MG capsule Take 2 mg by mouth at bedtime.  . Amino Acids-Protein Hydrolys (FEEDING SUPPLEMENT, PRO-STAT SUGAR FREE 64,) LIQD Take 30 mLs by mouth 2 (two) times daily. (Patient not taking: Reported on 06/25/2017)  . digoxin (LANOXIN) 0.25 MG tablet Take 1 tablet  (0.25 mg total) by mouth daily. (Patient not taking: Reported on 06/25/2017)  . loratadine (CLARITIN) 10 MG tablet Take 10 mg by mouth daily.  . [DISCONTINUED] amoxicillin-clavulanate (AUGMENTIN) 875-125 MG tablet Take 1 tablet by mouth 2 (two) times daily. (Patient not taking: Reported on 06/25/2017)   No facility-administered encounter medications on file as of 06/25/2017.      Review of Systems  Constitutional:   No  weight loss, night sweats,  Fevers, chills,  +fatigue, or  lassitude.  HEENT:   No headaches,  Difficulty swallowing,  Tooth/dental problems, or  Sore throat,                No sneezing, itching, ear ache, nasal congestion, post nasal drip,   CV:  No chest pain,  Orthopnea, PND, swelling in lower extremities, anasarca, dizziness, palpitations,  syncope.   GI  No heartburn, indigestion, abdominal pain, nausea, vomiting, diarrhea, change in bowel habits, loss of appetite, bloody stools.   Resp: No shortness of breath with exertion or at rest.  No excess mucus, no productive cough,  No non-productive cough,  No coughing up of blood.  No change in color of mucus.  No wheezing.  No chest wall deformity  Skin: no rash or lesions.  GU: no dysuria, change in color of urine, no urgency or frequency.  No flank pain, no hematuria   MS:  No joint pain or swelling.  No decreased range of motion.  No back pain.    Physical Exam  BP 124/68 (BP Location: Right Arm, Cuff Size: Normal)   Pulse 69   Ht _0  (1.6 m)   Wt 104 lb 6.4 oz (47.4 kg)   SpO2 99%   BMI 18.49 kg/m   GEN: A/Ox3; pleasant , NAD, thin and frail in wc    HEENT:  [place]/AT,  EACs-clear, TMs-wnl, NOSE-clear, THROAT-clear, no lesions, no postnasal drip or exudate noted.   NECK:  Supple w/ fair ROM; no JVD; normal carotid impulses w/o bruits; no thyromegaly or nodules palpated; no lymphadenopathy.    RESP  Decreased BS in bases ,  no accessory muscle use, no dullness to percussion  CARD:  RRR, no m/r/g, 1+  peripheral edema, pulses intact, no cyanosis or clubbing.  GI:   Soft & nt; nml bowel sounds; no organomegaly or masses detected.   Musco: Warm bil, no deformities or joint swelling noted.   Neuro: alert, no focal deficits noted.    Skin: Warm, no lesions or rashes    Lab Results:    Imaging: Dg Chest 2 View  Result Date: 06/17/2017 CLINICAL DATA:  Pneumonia. Shortness of breath. Congestive heart failure and pulmonary fibrosis. EXAM: CHEST  2 VIEW COMPARISON:  06/15/2017 FINDINGS: Stable cardiomegaly. There has been mild improvement in bilateral heterogeneous airspace disease since previous study. Underlying chronic bibasilar interstitial lung disease again noted. Aortic atherosclerosis. IMPRESSION: Mild improvement in bilateral heterogeneous airspace disease since previous study. Stable cardiomegaly and underlying chronic interstitial lung disease. Electronically Signed   By: Earle Gell M.D.   On: 06/17/2017 09:25   US Renal  Result Date: 06/11/2017 CLINICAL DATA:  Acute kidney injury EXAM: RENAL / URINARY TRACT ULTRASOUND COMPLETE COMPARISON:  None. FINDINGS: Right Kidney: Length: 10 cm. Echogenic appearance. No mass or hydronephrosis visualized. Left Kidney: Length: 10 cm. Subjectively normal echogenicity. Cortical thinning measuring 1 cm. No visible hydronephrosis. Limited visualization due to narrow sonographic windows with no gross mass lesion. Bladder: Appears normal for degree of bladder distention. Small volume ascites seen about the liver. IMPRESSION: 1. Negative for hydronephrosis. 2. Possible medical renal disease. Mild left renal cortical thinning. 3. Small ascites. Electronically Signed   By: Monte Fantasia M.D.   On: 06/11/2017 04:40   Dg Chest Port 1 View  Result Date: 06/19/2017 CLINICAL DATA:  Respiratory failure.  Improved breathing. EXAM: PORTABLE CHEST 1 VIEW COMPARISON:  Two-view chest x-ray 06/17/2017 FINDINGS: Heart is enlarged. Aortic atherosclerosis is again  noted. Diffuse interstitial and airspace disease are presents edema or airspace disease superimposed on chronic interstitial disease. Overall aeration pattern is slightly improved. Left greater than right pleural effusions are again noted. Lung volumes are improved. IMPRESSION: 1. Improving aeration in lung volumes bilaterally. 2. Persistent interstitial and airspace disease superimposed on chronic interstitial disease. This likely represents mild edema. Infection is considered less likely. 3. Aortic  atherosclerosis. Electronically Signed   By: San Morelle M.D.   On: 06/19/2017 09:43   Dg Chest Port 1 View  Result Date: 06/15/2017 CLINICAL DATA:  Pneumonia.  Shortness of breath EXAM: PORTABLE CHEST 1 VIEW COMPARISON:  Five days ago FINDINGS: Lower lung volumes with progressed bilateral opacity. Haziness at the right base, possible small effusion. Chronic cardiomegaly. No pneumothorax. IMPRESSION: History of pneumonia with lower lung volumes and increased opacity compared to 5 days ago. Electronically Signed   By: Monte Fantasia M.D.   On: 06/15/2017 14:36   Dg Chest Portable 1 View  Result Date: 05/31/2017 CLINICAL DATA:  Acute onset of shortness of breath and tachycardia. Initial encounter. EXAM: PORTABLE CHEST 1 VIEW COMPARISON:  Chest radiograph performed 05/24/2017 FINDINGS: The lungs are well-aerated. Persistent right basilar and left-sided airspace opacities reflect the patient's known chronic fibrotic change. No superimposed focal airspace consolidation is seen. No pleural effusion or pneumothorax is identified. The cardiomediastinal silhouette is mildly enlarged. No acute osseous abnormalities are seen. IMPRESSION: 1. Chronic fibrotic change again noted, worse on the left. No superimposed focal airspace consolidation seen. 2. Mild cardiomegaly. Electronically Signed   By: Garald Balding M.D.   On: 05/31/2017 21:56   Dg Abdomen Acute W/chest  Result Date: 06/10/2017 CLINICAL DATA:   Shortness of breath. EXAM: DG ABDOMEN ACUTE W/ 1V CHEST COMPARISON:  May 31, 2017 FINDINGS: Bibasilar opacities are identified. These are not significantly changed since May 31, 2017 but they have increased since April 28, 2017. IMPRESSION: Bibasilar opacities, worsened since April 28, 2017 are thought to represent an acute on chronic process such as infiltrate overlying basilar scarring or fibrosis. No other changes. Electronically Signed   By: Dorise Bullion III M.D   On: 06/10/2017 20:19   Dg Swallowing Func-speech Pathology  Result Date: 06/18/2017 Objective Swallowing Evaluation: Type of Study: MBS-Modified Barium Swallow Study Patient Details Name: Gilbert Dominguez MRN: [id number] Date of Birth: 04/13/1946 Today's Date: 06/18/2017 Time: SLP Start Time (ACUTE ONLY): 0938-SLP Stop Time (ACUTE ONLY): 0955 SLP Time Calculation (min) (ACUTE ONLY): 17 min Past Medical History: Past Medical History: Diagnosis Date . Atrial flutter ([place])   a. initially diagnosed in 04/2017, started on Eliquis b. recurrent in 05/2017 . CHF exacerbation ([place]) 02/24/2017 . Chronic respiratory failure ([place])  . Hypertension  . 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 Rheumatoid arthritis(714.0)  Past Surgical History: Past Surgical History: Procedure Laterality Date . ACNE CYST REMOVAL  1968 . VASECTOMY  1973 HPI: Pt is a 71 y/o male admitted with worsening right-sided PNA. PMH includes pulmonary fibrosis on 4L of oxygen, pulmonary HTN, RA, CHF, and DM. He had a BSE in February 2018 with normal appearing oropharyngeal swallow and recommendations for regular diet and thin liquids.  Subjective: pt sitting upright Assessment / Plan / Recommendation CHL IP CLINICAL IMPRESSIONS 06/18/2017 Clinical Impression Pt presents with functional oropharyngeal swallow ability without aspiration or penetration of any consistency tested *barium tablet, cracker, puree, nectar, thin) His oral transiting was timely with all boluses except barium tablet  - with which he required puree to transit.  No significant oropharyngeal residuals present during testing and pt's respiratory status remained stable.  Appearance of prominent cp with minimal barium residual near region.   Upon esophageal sweep after swallowing tablet with pudding- pt appeared with stasis distally and retrograde propulsion of thin barium given to faciliate clearance.  Water consumption appeared to adequately clear esophagus.  Radiologist was not present to confirm.   Suspect primary risk is  esophageal and precautions reviewed live with pt and provided in writing.   Pt's sensation of globus and lodging in pharynx is likely referrant to distal esophagus due to vagus nerve.  Will follow up x1 given tenuous respiratory status.  SLP Visit Diagnosis Dysphagia, pharyngoesophageal phase (R13.14) Attention and concentration deficit following -- Frontal lobe and executive function deficit following -- Impact on safety and function Mild aspiration risk   CHL IP TREATMENT RECOMMENDATION 06/16/2017 Treatment Recommendations Therapy as outlined in treatment plan below   Prognosis 06/18/2017 Prognosis for Safe Diet Advancement Good Barriers to Reach Goals -- Barriers/Prognosis Comment -- CHL IP DIET RECOMMENDATION 06/18/2017 SLP Diet Recommendations Regular solids;Thin liquid Liquid Administration via Cup;Straw Medication Administration Whole meds with liquid Compensations Small sips/bites;Follow solids with liquid;Other (Comment) Postural Changes Remain semi-upright after after feeds/meals (Comment);Seated upright at 90 degrees   CHL IP OTHER RECOMMENDATIONS 06/18/2017 Recommended Consults -- Oral Care Recommendations Oral care BID Other Recommendations --   CHL IP FOLLOW UP RECOMMENDATIONS 06/18/2017 Follow up Recommendations Other (comment)   CHL IP FREQUENCY AND DURATION 06/18/2017 Speech Therapy Frequency (ACUTE ONLY) min 1 x/week Treatment Duration 1 week      CHL IP ORAL PHASE 06/18/2017 Oral Phase Impaired Oral -  Pudding Teaspoon -- Oral - Pudding Cup -- Oral - Honey Teaspoon -- Oral - Honey Cup -- Oral - Nectar Teaspoon -- Oral - Nectar Cup WFL Oral - Nectar Straw -- Oral - Thin Teaspoon -- Oral - Thin Cup WFL Oral - Thin Straw WFL Oral - Puree WFL Oral - Mech Soft -- Oral - Regular WFL Oral - Multi-Consistency -- Oral - Pill Other (Comment) Oral Phase - Comment --  CHL IP PHARYNGEAL PHASE 06/18/2017 Pharyngeal Phase WFL Pharyngeal- Pudding Teaspoon -- Pharyngeal -- Pharyngeal- Pudding Cup -- Pharyngeal -- Pharyngeal- Honey Teaspoon -- Pharyngeal -- Pharyngeal- Honey Cup -- Pharyngeal -- Pharyngeal- Nectar Teaspoon -- Pharyngeal -- Pharyngeal- Nectar Cup WFL Pharyngeal -- Pharyngeal- Nectar Straw -- Pharyngeal -- Pharyngeal- Thin Teaspoon -- Pharyngeal -- Pharyngeal- Thin Cup WFL Pharyngeal -- Pharyngeal- Thin Straw WFL Pharyngeal -- Pharyngeal- Puree WFL Pharyngeal -- Pharyngeal- Mechanical Soft -- Pharyngeal -- Pharyngeal- Regular WFL Pharyngeal -- Pharyngeal- Multi-consistency -- Pharyngeal -- Pharyngeal- Pill WFL Pharyngeal -- Pharyngeal Comment --  CHL IP CERVICAL ESOPHAGEAL PHASE 06/18/2017 Cervical Esophageal Phase Impaired Pudding Teaspoon -- Pudding Cup -- Honey Teaspoon -- Honey Cup -- Nectar Teaspoon -- Nectar Cup -- Nectar Straw -- Thin Teaspoon -- Thin Cup -- Thin Straw -- Puree -- Mechanical Soft -- Regular -- Multi-consistency -- Pill -- Cervical Esophageal Comment appearance of prominent cp with minimal barium residual near region, upon esophageal sweep after swallowing tablet with pudding- pt appeared with stasis distally without awareness/sensation, more puree did not help clear - water consumption adequately cleared No flowsheet data found. [name] 06/18/2017, 10:28 AM Luanna Salk, MS [facility] SLP [phone]                Assessment & Plan:   Chronic diastolic CHF (congestive heart failure) (HCC) Recent decompensation , improved with diuresis  Check bmet /bnp today .    Pulmonary  hypertension due to interstitial lung disease (HCC) Cont on diuretics and O2 .  Keep follow up with Dr. [place] Bells   Chronic respiratory failure with hypoxia ([place]) Cont on O2 to keep sats >88-90%.   HCAP (healthcare-associated pneumonia) Clinically improved w/ abx  Check cxr on return in 6 weeks.   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

## 2017-06-25 NOTE — Assessment & Plan Note (Signed)
Cont on diuretics and O2 .  Keep follow up with Dr. [place] Bells

## 2017-06-25 NOTE — Assessment & Plan Note (Signed)
Recent decompensation , improved with diuresis  Check bmet /bnp today .

## 2017-06-26 ENCOUNTER — Other Ambulatory Visit: Payer: Self-pay | Admitting: Internal Medicine

## 2017-06-26 NOTE — Progress Notes (Signed)
Chart and office note reviewed in detail  > agree with a/p as outlined

## 2017-06-30 NOTE — Progress Notes (Signed)
Called patient unable to reach left message to give [place] a call back.

## 2017-07-06 ENCOUNTER — Encounter: Payer: Self-pay | Admitting: *Deleted

## 2017-07-06 ENCOUNTER — Ambulatory Visit: Payer: Medicare Other | Admitting: Physician Assistant

## 2017-07-06 NOTE — Progress Notes (Deleted)
Cardiology Office Note    Date:  07/06/2017   ID:  Gilbert Dominguez, DOB Apr 27, 1946, MRN [id number]  PCP:  Gilbert Downing, MD  Cardiologist:  Dr. Debara Dominguez / Dr. Caryl Dominguez   No chief complaint on file.   History of Present Illness:  Gilbert Dominguez is a 71 y.o. male with PMH of atrial flutter on eliquis, chronic diastolic heart failure, chronic respiratory failure on 4 L [place], pulmonary fibrosis, pulmonary hypertension, stage III CKD, and type II DM. He was admitted from 7/11-7/19 for atrial tachycardia and CHF exacerbation. Echocardiogram showed EF 55-60%,  RWMA, mild left ear, moderate to severe TR. He was diuresed with IV Lasix and with overall net output -14 L. he was discharged on Bumex 2 mg BID along with eliquis and Cardizem CD 120 mg daily. He returned 8/6-8/8 for chronic respiratory failure with hypoxia, again 8/13-8/16 with atrial flutter with RVR and CHF exacerbation. He was seen by both Dr. Debara Dominguez and Dr. Caryl Dominguez during that admission, according to Dr. Caryl Dominguez he is not good ablation candidate. Antiarrhythmic therapy is unlikely to be successful in this patient will have pulmonary issues. He is not a candidate for amiodarone therapy either. He was admitted from 8/23-9/1 for respiratory failure and hospital-acquired pneumonia. Digoxin was added for rate control, this was discontinued prior to discharge. There was some consideration for home hospice given recurrent pulmonary issues. He was discharged to complete a 14 day course of Augmentin. Since discharge, he he has been seen by pulmonology service on 06/25/2017, digoxin level was normal, proBNP lower than previous level. Creatinine trended up to 1.17. Magnesium level low, it was recommended for him to start on magnesium supplement.  Yes EKG     Past Medical History:  Diagnosis Date  . Atrial flutter ([place])    a. initially diagnosed in 04/2017, started on Eliquis b. recurrent in 05/2017  . CHF exacerbation ([place]) 02/24/2017  . Chronic  respiratory failure ([place])   . Hypertension   . 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 Rheumatoid arthritis(714.0)     Past Surgical History:  Procedure Laterality Date  . ACNE CYST REMOVAL  1968  . VASECTOMY  1973    Current Medications: Outpatient Medications Prior to Visit  Medication Sig Dispense Refill  . acetaminophen (TYLENOL) 500 MG tablet Take 1,000 mg by mouth every 6 (six) hours as needed for mild pain.    . Amino Acids-Protein Hydrolys (FEEDING SUPPLEMENT, PRO-STAT SUGAR FREE 64,) LIQD Take 30 mLs by mouth 2 (two) times daily. (Patient not taking: Reported on 06/25/2017) 900 mL 0  . apixaban (ELIQUIS) 5 MG TABS tablet Take 1 tablet (5 mg total) by mouth 2 (two) times daily. 60 tablet 0  . bumetanide (BUMEX) 2 MG tablet Take 1 tablet (2 mg total) by mouth 2 (two) times daily. 60 tablet 0  . Calcium Carbonate-Vit D-Min (CALCIUM 600+D PLUS MINERALS PO) Take 1 tablet by mouth 2 (two) times daily.    Marland Kitchen dextromethorphan-guaiFENesin (MUCINEX DM) 30-600 MG 12hr tablet Take 1 tablet by mouth 2 (two) times daily.     . digoxin (LANOXIN) 0.25 MG tablet Take 1 tablet (0.25 mg total) by mouth daily. (Patient not taking: Reported on 06/25/2017) 20 tablet 0  . diltiazem (CARDIZEM CD) 360 MG 24 hr capsule Take 1 capsule (360 mg total) by mouth daily. 30 capsule 6  . famotidine (PEPCID) 20 MG tablet Take 1 tablet (20 mg total) by mouth at bedtime. 90 tablet 1  . insulin glargine (LANTUS)  100 UNIT/ML injection Inject 0.08 mLs (8 Units total) into the skin daily. 10 mL 11  . loratadine (CLARITIN) 10 MG tablet Take 10 mg by mouth daily.    . magnesium oxide (MAG-OX) 400 MG tablet Take 1 tablet (400 mg total) by mouth daily. 30 tablet 0  . metFORMIN (GLUCOPHAGE XR) 500 MG 24 hr tablet Take 2 tabs with breakfast and 2 tabs with supper (Patient taking differently: Take 1,000 mg by mouth 2 (two) times daily. ) 120 tablet 0  . OXYGEN Inhale 4-8 L into the lungs See admin instructions. 4 liters  CONTINUOUSLY and to titrate up to 8 liters CONTINUOUSLY (with exertion) "to keep sats above 90" ([employer])    . pantoprazole (PROTONIX) 40 MG tablet TAKE 1 TABLET BY MOUTH EVERY DAY *TAKE 30-60 MINUTES BEFORE FIRST MEAL OF THE DAY* 30 tablet 11  . potassium chloride SA (K-DUR,KLOR-CON) 20 MEQ tablet Take 1 tablet (20 mEq total) by mouth daily. 30 tablet 0  . predniSONE (DELTASONE) 20 MG tablet Take 1 tablet (20 mg total) by mouth daily with breakfast. 30 tablet 0  . tamsulosin (FLOMAX) 0.4 MG CAPS capsule Take 0.4 mg by mouth daily after supper.    . terazosin (HYTRIN) 2 MG capsule Take 2 mg by mouth at bedtime.     No facility-administered medications prior to visit.      Allergies:   Lovastatin   Social History   Social History  . Marital status: Single    Spouse name: N/A  . Number of children: 2  . Years of education: N/A   Occupational History  . Retired Optometrist    Social History Main Topics  . Smoking status: Former Smoker    Packs/day: 1.50    Years: 37.00    Types: Cigarettes    Quit date: 10/14/1995  . Smokeless tobacco: Never Used  . Alcohol use 1.8 oz/week    3 Cans of beer per week     Comment: occasionally  . Drug use: No  . Sexual activity: Not on file   Other Topics Concern  . Not on file   Social History Narrative  . No narrative on file     Family History:  The patient's ***family history includes Emphysema in his father; Heart disease in his father; Liver cancer in his mother.   ROS:   Please see the history of present illness.    ROS All other systems reviewed and are negative.   PHYSICAL EXAM:   VS:  There were no vitals taken for this visit.   GEN: Well nourished, well developed, in no acute distress  HEENT: normal  Neck: no JVD, carotid bruits, or masses Cardiac: ***RRR; no murmurs, rubs, or gallops,no edema  Respiratory:  clear to auscultation bilaterally, normal work of breathing GI: soft, nontender, nondistended, + BS MS: no  deformity or atrophy  Skin: warm and dry, no rash Neuro:  Alert and Oriented x 3, Strength and sensation are intact Psych: euthymic mood, full affect  Wt Readings from Last 3 Encounters:  06/25/17 104 lb 6.4 oz (47.4 kg)  06/19/17 110 lb (49.9 kg)  06/03/17 125 lb 6.4 oz (56.9 kg)      Studies/Labs Reviewed:   EKG:  EKG is*** ordered today.  The ekg ordered today demonstrates ***  Recent Labs: 06/01/2017: TSH 3.042 06/10/2017: B Natriuretic Peptide 1,928.5 06/16/2017: Hemoglobin 11.6; Platelets 205 06/17/2017: ALT 43 06/25/2017: BUN 41; Creatinine, Ser 1.17; Magnesium 1.5; Potassium 4.0; Pro B Natriuretic peptide (BNP)  1,021.0; Sodium 134   Lipid Panel No results found for: CHOL, TRIG, HDL, CHOLHDL, VLDL, LDLCALC, LDLDIRECT  Additional studies/ records that were reviewed today include:   Echo 05/01/2017 LV EF: 55% -   60%  ------------------------------------------------------------------- Indications:      Abnormal EKG 794.31.  ------------------------------------------------------------------- Study Conclusions  - Left ventricle: The cavity size was mildly reduced. Systolic   function was normal. The estimated ejection fraction was in the   range of 55% to 60%. Wall motion was normal; there were no   regional wall motion abnormalities. Doppler parameters are   consistent with abnormal left ventricular relaxation (grade 1   diastolic dysfunction). - Ventricular septum: The contour showed diastolic flattening and   systolic flattening. These changes are consistent with RV volume   and pressure overload. - Aortic valve: There was very mild stenosis. Valve area (VTI):   1.85 cm^2. Valve area (Vmax): 1.61 cm^2. Valve area (Vmean): 1.5   cm^2. - Right ventricle: The cavity size was severely dilated. Systolic   function was moderately to severely reduced. - Right atrium: The atrium was severely dilated. - Tricuspid valve: There was moderate-severe regurgitation directed    eccentrically. - Pulmonary arteries: Systolic pressure was severely increased. PA   peak pressure: 87 mm Hg (S).    ASSESSMENT:    No diagnosis found.   PLAN:  In order of problems listed above:  1. ***    Medication Adjustments/Labs and Tests Ordered: Current medicines are reviewed at length with the patient today.  Concerns regarding medicines are outlined above.  Medication changes, Labs and Tests ordered today are listed in the Patient Instructions below. There are no Patient Instructions on file for this visit.   Hilbert Corrigan, [place]  07/06/2017 6:39 AM    [address], [place], [place]  [address] Phone: [phone]; Fax: [phone]

## 2017-07-09 ENCOUNTER — Telehealth (HOSPITAL_COMMUNITY): Payer: Self-pay

## 2017-07-12 ENCOUNTER — Ambulatory Visit: Payer: Medicare Other | Admitting: Pulmonary Disease

## 2017-07-13 ENCOUNTER — Encounter: Payer: Self-pay | Admitting: Internal Medicine

## 2017-07-14 ENCOUNTER — Ambulatory Visit: Payer: Medicare Other | Admitting: Pulmonary Disease

## 2017-07-22 ENCOUNTER — Other Ambulatory Visit: Payer: Self-pay | Admitting: Adult Health

## 2017-07-22 NOTE — Telephone Encounter (Signed)
Refill request for Magnesium 412m Rx was done by TP w/ the 9.7.18 lab results - mag level was low Patient was to return 2 weeks from that time for repeat BMET and follow up with cardiology  Called spoke with patient's spouse VEritreawho reported that [facility] [place].  VEritreareported [facility] and she has been communicating with that pharmacy to place med[facility]  Advised spouse will call [facility] the [facility] for her.  [facility] and spoke with JMechele Claude pEducation administrator- she will place the magnesium on hold.  Nothing further needed at this time; will sign off.

## 2017-08-06 ENCOUNTER — Ambulatory Visit (INDEPENDENT_AMBULATORY_CARE_PROVIDER_SITE_OTHER): Payer: Medicare Other | Admitting: Internal Medicine

## 2017-08-06 ENCOUNTER — Other Ambulatory Visit (INDEPENDENT_AMBULATORY_CARE_PROVIDER_SITE_OTHER): Payer: Medicare Other

## 2017-08-06 ENCOUNTER — Encounter: Payer: Self-pay | Admitting: Internal Medicine

## 2017-08-06 VITALS — BP 102/60 | HR 92 | Ht 63.0 in | Wt 112.0 lb

## 2017-08-06 DIAGNOSIS — I50810 Right heart failure, unspecified: Secondary | ICD-10-CM

## 2017-08-06 DIAGNOSIS — J8410 Pulmonary fibrosis, unspecified: Secondary | ICD-10-CM

## 2017-08-06 DIAGNOSIS — J9611 Chronic respiratory failure with hypoxia: Secondary | ICD-10-CM

## 2017-08-06 DIAGNOSIS — I2729 Other secondary pulmonary hypertension: Secondary | ICD-10-CM | POA: Diagnosis not present

## 2017-08-06 LAB — CBC WITH DIFFERENTIAL/PLATELET
BASOS ABS: 0 10*3/uL (ref 0.0–0.1)
Basophils Relative: 0.2 % (ref 0.0–3.0)
EOS PCT: 0.1 % (ref 0.0–5.0)
Eosinophils Absolute: 0 10*3/uL (ref 0.0–0.7)
HCT: 31.3 % — ABNORMAL LOW (ref 39.0–52.0)
HEMOGLOBIN: 10.2 g/dL — AB (ref 13.0–17.0)
LYMPHS ABS: 0.4 10*3/uL — AB (ref 0.7–4.0)
Lymphocytes Relative: 2.9 % — ABNORMAL LOW (ref 12.0–46.0)
MCHC: 32.7 g/dL (ref 30.0–36.0)
MCV: 88.6 fl (ref 78.0–100.0)
MONO ABS: 0.5 10*3/uL (ref 0.1–1.0)
Monocytes Relative: 3.3 % (ref 3.0–12.0)
Neutro Abs: 13.6 10*3/uL — ABNORMAL HIGH (ref 1.4–7.7)
Platelets: 299 10*3/uL (ref 150.0–400.0)
RBC: 3.53 Mil/uL — AB (ref 4.22–5.81)
RDW: 18.2 % — ABNORMAL HIGH (ref 11.5–15.5)
WBC: 14.6 10*3/uL — AB (ref 4.0–10.5)

## 2017-08-06 LAB — BASIC METABOLIC PANEL
BUN: 28 mg/dL — AB (ref 6–23)
CALCIUM: 9.4 mg/dL (ref 8.4–10.5)
CO2: 29 mEq/L (ref 19–32)
Chloride: 94 mEq/L — ABNORMAL LOW (ref 96–112)
Creatinine, Ser: 0.99 mg/dL (ref 0.40–1.50)
GFR: 78.73 mL/min (ref >60.00)
GLUCOSE: 148 mg/dL — AB (ref 70–99)
POTASSIUM: 3.7 meq/L (ref 3.5–5.1)
SODIUM: 138 meq/L (ref 135–145)

## 2017-08-06 LAB — SEDIMENTATION RATE: SED RATE: 46 mm/h — AB (ref 0–20)

## 2017-08-06 LAB — BRAIN NATRIURETIC PEPTIDE: PRO B NATRI PEPTIDE: 1885 pg/mL — AB (ref 0.0–100.0)

## 2017-08-06 NOTE — Progress Notes (Signed)
lmtcb

## 2017-08-06 NOTE — Progress Notes (Signed)
Spoke with pt and notified of results per Dr. Melvyn Novas. Pt verbalized understanding and denied any questions.

## 2017-08-06 NOTE — Patient Instructions (Signed)
No change in medications  Please remember to go to the lab department downstairs in the basement  for your tests - we will call you with the results when they are available.      See Tammy NP  4  weeks with your pill boxes  all your medications, even over the counter meds, separated in two separate bags, the ones you take no matter what vs the ones you stop once you feel better and take only as needed when you feel you need them.   Tammy  will generate for you a new user friendly medication calendar that will put [place] all on the same page re: your medication use.     Without this process, it simply isn't possible to assure that we are providing  your outpatient care  with  the attention to detail we feel you deserve.   If we cannot assure that you're getting that kind of care,  then we cannot manage your problem effectively from this clinic.  Once you have seen Tammy and we are sure that we're all on the same page with your medication use she will arrange follow up with me.

## 2017-08-06 NOTE — Progress Notes (Signed)
Subjective:   Patient ID: Gilbert Dominguez, male    DOB: Feb 23, 1946   MRN: [id number]   Brief patient profile:  1  yowm quit smoking 1996 with dx of RA around 2010 followed by [facility] referred 11/23/2013 to pulmonary clinic for ? ILD with pfts c/w mod restriction  09/04/2014 unchanged from 09/2013 and new dx of Gilbert Dominguez  11/2016     History of Present Illness  11/23/2013 1st [facility] office visit/ Gilbert Dominguez cc new doe x Jan 2014 and stopped all RA meds(mtx) in august of 2014 - onset was insidious and gradual to point where walk 50 ft esp last month assoc with dry cough but ok  control of arthritis ( vs historical control) and mild chronic nasal congestion s purulent secretions or sinus pain or epistaxis  rec Please see patient coordinator before you leave today  to schedule 02 24/7  2lpm at rest and 4lpm with activity  Prednisone 20 mg twice daily with meals  Pantoprazole (protonix) 40 mg   Take 30-60 min before first meal of the day and Pepcid 20 mg one bedtime until return to office - this is the best way to tell whether stomach acid is contributing to your problem.   GERD diet reviewed   03/08/2014 f/u ov/Gilbert Dominguez re: ILD / 02 dep at hs and ex not using 02 as rec  Chief Complaint  Patient presents with  . Follow-up    c/o nonprod cough after cold food/drinks.  No other complaints at this time.   can walk medium pace flat s 02 for 15-20 min and sats are in mid 80s  arthritis and breathing much better since rx with prednisone being tapered down by Gilbert Dominguez to 20 mg per day  rec No need for 02 at rest or room to room walking Please use 2lpm at hs and   2lpm with walking outside more than 200 ft  Please see patient coordinator before you leave today  to schedule a portable system for your daily wal   Admit date: 05/24/2017 Discharge date: 05/26/2017  Recommendations for Outpatient Follow-Up:   Avoid liver toxic meds/beverages: tylenol/alcohol CMP with PCP in 1 week-- if liver enzymes  still elevated, will need further imaging- either U/S or preferably CT Scan  Discharge Diagnosis:   Principal Problem:   Chronic respiratory failure with hypoxia ([place]) Active Problems:   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 Follow-up    Pt states that he wsa admitted to hospital after running out of o2 at home- 05/24/17- 05/26/17. Breathing has improved some, but back not to baseline. His appetite has been poor.    rapidly deteriorated off 02 because he was at a car dealership longer than he anticipated and back near nl now but some extra swelling noted  ? What is your action plan if you swell   A: I don't know   Very confused with details of care, no longer has med calendar with written action plan  rec Take extra fluid pill daily until wt back down to 117  Med rec/ med calendar    06/25/17 NP ov rec Continue on Prednisone 69m daily .  Continue on 4l/m oxygen rest and 8 l/m activity     08/06/2017  f/u ov/Gilbert Dominguez re:   PF/ pred 20 mg daily and 4 lpm 24/7  Post Admit [facility] / no med calendar  Chief Complaint  Patient presents with  . Follow-up    Cough has improved some. His breathing is unchanged since his last visit. No new co's.    going to Nutritional therapist for referral to [place]  To establish  Walking out in yard up to 100 ft on 8lpm sats ok  Sleeps ok on 4lpm  Plans to call new rhem / some wrist stiffness but otherwise arthritis ok / min dry cough   No obvious day to day or daytime variability or assoc excess/ purulent sputum or mucus plugs or hemoptysis or cp or chest tightness, subjective wheeze or overt sinus or hb symptoms. No unusual exp hx or h/o childhood pna/ asthma or knowledge of premature  birth.  Sleeping ok flat without nocturnal  or early am exacerbation  of respiratory  c/o's or need for noct saba. Also denies any obvious fluctuation of symptoms with weather or environmental changes or other aggravating or alleviating factors except as outlined above   Current Allergies, Complete Past Medical History, Past Surgical History, Family History, and Social History were reviewed in [employer] record.  ROS  The following are not active complaints unless bolded Hoarseness, sore throat, dysphagia, dental problems, itching, sneezing,  nasal congestion or discharge of excess mucus or purulent secretions, ear ache,   fever, chills, sweats, unintended wt loss or wt gain, classically pleuritic or exertional cp,  orthopnea pnd or leg swelling, presyncope, palpitations, abdominal pain, anorexia, nausea, vomiting, diarrhea  or change in bowel habits or change in bladder habits, change in stools or change in urine, dysuria, hematuria,  rash, arthralgias, visual complaints, headache, numbness, weakness or ataxia or problems with walking or coordination,  change in mood/affect or memory.        Current Meds  Medication Sig  . acetaminophen (TYLENOL) 500 MG tablet Take 1,000 mg by mouth every 6 (six) hours as needed for mild pain.  . Amino Acids-Protein Hydrolys (FEEDING SUPPLEMENT, PRO-STAT SUGAR FREE 64,) LIQD Take 30 mLs by mouth 2 (two) times daily.  Marland Kitchen apixaban (ELIQUIS) 5 MG TABS tablet Take 1 tablet (5 mg total) by mouth 2 (two) times daily.  . bumetanide (BUMEX) 2 MG tablet Take 1 tablet (2 mg total) by mouth 2 (two) times daily.  . Calcium Carbonate-Vit D-Min (CALCIUM 600+D PLUS MINERALS PO) Take 1 tablet by mouth 2 (two) times daily.  Marland Kitchen dextromethorphan-guaiFENesin (MUCINEX DM) 30-600 MG 12hr tablet Take 1 tablet by mouth 2 (two) times daily.   . digoxin (LANOXIN) 0.25 MG tablet Take 1 tablet (0.25 mg total) by mouth daily.  Marland Kitchen diltiazem (CARDIZEM CD) 360 MG 24 hr  capsule Take 1 capsule (360 mg total) by mouth daily.  . famotidine (PEPCID) 20 MG tablet Take 1 tablet (20 mg total) by mouth at bedtime.  . insulin glargine (LANTUS) 100 UNIT/ML injection Inject 0.08 mLs (8 Units total) into the skin daily.  Marland Kitchen loratadine (CLARITIN) 10 MG tablet Take 10 mg by mouth daily.  . magnesium oxide (MAG-OX) 400 (241.3 Mg) MG tablet Take 1 tablet (400 mg total) by mouth daily. ** REFILLS ON HOLD WHILE PT IS IN SNF 10/4 **  . metFORMIN (GLUCOPHAGE XR) 500 MG 24 hr tablet Take 2 tabs with breakfast and 2 tabs with supper (Patient  taking differently: Take 1,000 mg by mouth 2 (two) times daily. )  . OXYGEN Inhale 4-8 L into the lungs See admin instructions. 4 liters CONTINUOUSLY and to titrate up to 8 liters CONTINUOUSLY (with exertion) "to keep sats above 90" ([employer])  . pantoprazole (PROTONIX) 40 MG tablet TAKE 1 TABLET BY MOUTH EVERY DAY *TAKE 30-60 MINUTES BEFORE FIRST MEAL OF THE DAY*  . potassium chloride SA (K-DUR,KLOR-CON) 20 MEQ tablet Take 1 tablet (20 mEq total) by mouth daily.  . predniSONE (DELTASONE) 20 MG tablet Take 1 tablet (20 mg total) by mouth daily with breakfast.  . tamsulosin (FLOMAX) 0.4 MG CAPS capsule Take 0.4 mg by mouth daily after supper.  . terazosin (HYTRIN) 2 MG capsule Take 2 mg by mouth at bedtime.                    Objective:   Physical Exam  amb wm nad / vital signs reviewed -  - Note on arrival 02 sats  88% on 5lpm   pulsed  09/04/2014      160 > 12/12/2014   162 > 04/11/2015   158  > 11/19/2015   142  > 12/31/2015 140 > 02/11/2016  137 > 04/20/2016  134 > 07/21/2016 134 >  11/03/2016   136  > 01/01/2017    135 > 02/02/2017  133 > 05/27/2017   124 > 08/06/2017  112    03/08/14 159 lb (72.122 kg)  12/08/13 154 lb (69.854 kg)  11/23/13 158 lb 3.2 oz (71.759 kg)       HEENT: nl dentition, turbinates, and oropharynx. Nl external ear canals without cough reflex   NECK :  without JVD/Nodes/TM/ nl carotid upstrokes bilaterally   LUNGS:    Bronchovesicular bs with coarse crackles bilaterally  Now no longer  cough on insp    CV:  RRR  no s3 or murmur or increase in P2,   Trace  bilaterally sym ankle edema  L slt > R     ABD:  soft and nontender with nl excursion in the supine position. No bruits or organomegaly, bowel sounds nl  MS:  warm without deformities, calf tenderness, cyanosis- mild clubbng   SKIN: warm and dry without lesions    NEURO:  alert, approp, no deficits    Labs ordered/ reviewed:      Chemistry      Component Value Date/Time   NA 138 08/06/2017 0952   K 3.7 08/06/2017 0952   CL 94 (L) 08/06/2017 0952   CO2 29 08/06/2017 0952   BUN 28 (H) 08/06/2017 0952   CREATININE 0.99 08/06/2017 0952      Component Value Date/Time   CALCIUM 9.4 08/06/2017 0952   ALKPHOS 117 06/17/2017 0317   AST 46 (H) 06/17/2017 0317   ALT 43 06/17/2017 0317   BILITOT 1.1 06/17/2017 0317        Lab Results  Component Value Date   WBC 14.6 (H) 08/06/2017   HGB 10.2 (L) 08/06/2017   HCT 31.3 (L) 08/06/2017   MCV 88.6 08/06/2017   PLT 299.0 08/06/2017         Lab Results  Component Value Date   TSH 3.042 06/01/2017     Lab Results  Component Value Date   PROBNP 1,885.0 (H) 08/06/2017       Lab Results  Component Value Date   ESRSEDRATE 46 (H) 08/06/2017   ESRSEDRATE 48 (H) 12/31/2015   ESRSEDRATE 14 12/08/2013  Assessment & Plan:

## 2017-08-08 ENCOUNTER — Encounter (HOSPITAL_COMMUNITY): Payer: Self-pay

## 2017-08-08 ENCOUNTER — Inpatient Hospital Stay (HOSPITAL_COMMUNITY)
Admission: EM | Admit: 2017-08-08 | Discharge: 2017-08-19 | DRG: 682 | Disposition: E | Payer: Medicare Other | Attending: Pulmonary Disease | Admitting: Pulmonary Disease

## 2017-08-08 ENCOUNTER — Emergency Department (HOSPITAL_COMMUNITY): Payer: Medicare Other

## 2017-08-08 DIAGNOSIS — T460X1A Poisoning by cardiac-stimulant glycosides and drugs of similar action, accidental (unintentional), initial encounter: Secondary | ICD-10-CM

## 2017-08-08 DIAGNOSIS — S81811A Laceration without foreign body, right lower leg, initial encounter: Secondary | ICD-10-CM

## 2017-08-08 DIAGNOSIS — Z8249 Family history of ischemic heart disease and other diseases of the circulatory system: Secondary | ICD-10-CM

## 2017-08-08 DIAGNOSIS — M069 Rheumatoid arthritis, unspecified: Secondary | ICD-10-CM | POA: Diagnosis present

## 2017-08-08 DIAGNOSIS — I130 Hypertensive heart and chronic kidney disease with heart failure and stage 1 through stage 4 chronic kidney disease, or unspecified chronic kidney disease: Secondary | ICD-10-CM | POA: Diagnosis present

## 2017-08-08 DIAGNOSIS — T380X5A Adverse effect of glucocorticoids and synthetic analogues, initial encounter: Secondary | ICD-10-CM | POA: Diagnosis present

## 2017-08-08 DIAGNOSIS — I4891 Unspecified atrial fibrillation: Secondary | ICD-10-CM | POA: Diagnosis present

## 2017-08-08 DIAGNOSIS — E872 Acidosis, unspecified: Secondary | ICD-10-CM

## 2017-08-08 DIAGNOSIS — T460X1D Poisoning by cardiac-stimulant glycosides and drugs of similar action, accidental (unintentional), subsequent encounter: Secondary | ICD-10-CM

## 2017-08-08 DIAGNOSIS — D638 Anemia in other chronic diseases classified elsewhere: Secondary | ICD-10-CM | POA: Diagnosis present

## 2017-08-08 DIAGNOSIS — Z9981 Dependence on supplemental oxygen: Secondary | ICD-10-CM

## 2017-08-08 DIAGNOSIS — A419 Sepsis, unspecified organism: Secondary | ICD-10-CM

## 2017-08-08 DIAGNOSIS — I272 Pulmonary hypertension, unspecified: Secondary | ICD-10-CM | POA: Diagnosis present

## 2017-08-08 DIAGNOSIS — N179 Acute kidney failure, unspecified: Secondary | ICD-10-CM | POA: Diagnosis not present

## 2017-08-08 DIAGNOSIS — I4892 Unspecified atrial flutter: Secondary | ICD-10-CM | POA: Diagnosis present

## 2017-08-08 DIAGNOSIS — E875 Hyperkalemia: Secondary | ICD-10-CM

## 2017-08-08 DIAGNOSIS — T460X5A Adverse effect of cardiac-stimulant glycosides and drugs of similar action, initial encounter: Secondary | ICD-10-CM | POA: Diagnosis present

## 2017-08-08 DIAGNOSIS — E162 Hypoglycemia, unspecified: Secondary | ICD-10-CM | POA: Diagnosis not present

## 2017-08-08 DIAGNOSIS — Z87891 Personal history of nicotine dependence: Secondary | ICD-10-CM

## 2017-08-08 DIAGNOSIS — J189 Pneumonia, unspecified organism: Secondary | ICD-10-CM

## 2017-08-08 DIAGNOSIS — J9621 Acute and chronic respiratory failure with hypoxia: Secondary | ICD-10-CM | POA: Diagnosis present

## 2017-08-08 DIAGNOSIS — Z23 Encounter for immunization: Secondary | ICD-10-CM

## 2017-08-08 DIAGNOSIS — Z79899 Other long term (current) drug therapy: Secondary | ICD-10-CM

## 2017-08-08 DIAGNOSIS — R55 Syncope and collapse: Secondary | ICD-10-CM

## 2017-08-08 DIAGNOSIS — R001 Bradycardia, unspecified: Secondary | ICD-10-CM

## 2017-08-08 DIAGNOSIS — Z7952 Long term (current) use of systemic steroids: Secondary | ICD-10-CM

## 2017-08-08 DIAGNOSIS — Z7901 Long term (current) use of anticoagulants: Secondary | ICD-10-CM

## 2017-08-08 DIAGNOSIS — I5032 Chronic diastolic (congestive) heart failure: Secondary | ICD-10-CM | POA: Diagnosis present

## 2017-08-08 DIAGNOSIS — Z66 Do not resuscitate: Secondary | ICD-10-CM | POA: Diagnosis present

## 2017-08-08 DIAGNOSIS — N183 Chronic kidney disease, stage 3 (moderate): Secondary | ICD-10-CM | POA: Diagnosis present

## 2017-08-08 DIAGNOSIS — E876 Hypokalemia: Secondary | ICD-10-CM | POA: Diagnosis present

## 2017-08-08 DIAGNOSIS — Z794 Long term (current) use of insulin: Secondary | ICD-10-CM

## 2017-08-08 DIAGNOSIS — E871 Hypo-osmolality and hyponatremia: Secondary | ICD-10-CM | POA: Diagnosis present

## 2017-08-08 DIAGNOSIS — Z515 Encounter for palliative care: Secondary | ICD-10-CM | POA: Diagnosis present

## 2017-08-08 DIAGNOSIS — K819 Cholecystitis, unspecified: Secondary | ICD-10-CM | POA: Diagnosis present

## 2017-08-08 LAB — I-STAT CG4 LACTIC ACID, ED: LACTIC ACID, VENOUS: 11.58 mmol/L — AB (ref 0.5–1.9)

## 2017-08-08 LAB — CBC WITH DIFFERENTIAL/PLATELET
BASOS PCT: 0 %
Basophils Absolute: 0 10*3/uL (ref 0.0–0.1)
EOS ABS: 0 10*3/uL (ref 0.0–0.7)
Eosinophils Relative: 0 %
HEMATOCRIT: 34.6 % — AB (ref 39.0–52.0)
HEMOGLOBIN: 10.7 g/dL — AB (ref 13.0–17.0)
LYMPHS ABS: 1 10*3/uL (ref 0.7–4.0)
Lymphocytes Relative: 6 %
MCH: 27.6 pg (ref 26.0–34.0)
MCHC: 30.9 g/dL (ref 30.0–36.0)
MCV: 89.4 fL (ref 78.0–100.0)
MONOS PCT: 10 %
Monocytes Absolute: 1.6 10*3/uL — ABNORMAL HIGH (ref 0.1–1.0)
NEUTROS ABS: 13.2 10*3/uL — AB (ref 1.7–7.7)
NEUTROS PCT: 84 %
Platelets: 297 10*3/uL (ref 150–400)
RBC: 3.87 MIL/uL — AB (ref 4.22–5.81)
RDW: 17.6 % — ABNORMAL HIGH (ref 11.5–15.5)
WBC: 15.8 10*3/uL — AB (ref 4.0–10.5)

## 2017-08-08 LAB — COMPREHENSIVE METABOLIC PANEL
ALBUMIN: 3.2 g/dL — AB (ref 3.5–5.0)
ALK PHOS: 75 U/L (ref 38–126)
ALT: 22 U/L (ref 17–63)
AST: 56 U/L — ABNORMAL HIGH (ref 15–41)
Anion gap: 18 — ABNORMAL HIGH (ref 5–15)
BILIRUBIN TOTAL: 0.9 mg/dL (ref 0.3–1.2)
BUN: 35 mg/dL — AB (ref 6–20)
CALCIUM: 8.4 mg/dL — AB (ref 8.9–10.3)
CO2: 16 mmol/L — AB (ref 22–32)
CREATININE: 1.83 mg/dL — AB (ref 0.61–1.24)
Chloride: 95 mmol/L — ABNORMAL LOW (ref 101–111)
GFR calc Af Amer: 41 mL/min — ABNORMAL LOW (ref >60)
GFR calc non Af Amer: 35 mL/min — ABNORMAL LOW (ref >60)
GLUCOSE: 204 mg/dL — AB (ref 65–99)
Potassium: 5.8 mmol/L — ABNORMAL HIGH (ref 3.5–5.1)
SODIUM: 129 mmol/L — AB (ref 135–145)
TOTAL PROTEIN: 6.4 g/dL — AB (ref 6.5–8.1)

## 2017-08-08 LAB — I-STAT TROPONIN, ED: Troponin i, poc: 0.11 ng/mL (ref 0.00–0.08)

## 2017-08-08 LAB — I-STAT CHEM 8, ED
BUN: 34 mg/dL — ABNORMAL HIGH (ref 6–20)
Calcium, Ion: 0.98 mmol/L — ABNORMAL LOW (ref 1.15–1.40)
Chloride: 96 mmol/L — ABNORMAL LOW (ref 101–111)
Creatinine, Ser: 1.6 mg/dL — ABNORMAL HIGH (ref 0.61–1.24)
GLUCOSE: 209 mg/dL — AB (ref 65–99)
HEMATOCRIT: 33 % — AB (ref 39.0–52.0)
Hemoglobin: 11.2 g/dL — ABNORMAL LOW (ref 13.0–17.0)
POTASSIUM: 5.9 mmol/L — AB (ref 3.5–5.1)
Sodium: 131 mmol/L — ABNORMAL LOW (ref 135–145)
TCO2: 18 mmol/L — ABNORMAL LOW (ref 22–32)

## 2017-08-08 LAB — DIGOXIN LEVEL: Digoxin Level: 2.1 ng/mL — ABNORMAL HIGH (ref 0.8–2.0)

## 2017-08-08 LAB — PROTIME-INR
INR: 1.71
Prothrombin Time: 19.9 seconds — ABNORMAL HIGH (ref 11.4–15.2)

## 2017-08-08 LAB — BRAIN NATRIURETIC PEPTIDE: B NATRIURETIC PEPTIDE 5: 1351.2 pg/mL — AB (ref 0.0–100.0)

## 2017-08-08 MED ORDER — INSULIN ASPART 100 UNIT/ML ~~LOC~~ SOLN
10.0000 [IU] | Freq: Once | SUBCUTANEOUS | Status: AC
  Administered 2017-08-08: 10 [IU] via INTRAVENOUS
  Filled 2017-08-08: qty 1

## 2017-08-08 MED ORDER — PIPERACILLIN-TAZOBACTAM 3.375 G IVPB 30 MIN
3.3750 g | Freq: Once | INTRAVENOUS | Status: AC
  Administered 2017-08-08: 3.375 g via INTRAVENOUS
  Filled 2017-08-08: qty 50

## 2017-08-08 MED ORDER — TETANUS-DIPHTH-ACELL PERTUSSIS 5-2.5-18.5 LF-MCG/0.5 IM SUSP
0.5000 mL | Freq: Once | INTRAMUSCULAR | Status: AC
  Administered 2017-08-08: 0.5 mL via INTRAMUSCULAR
  Filled 2017-08-08: qty 0.5

## 2017-08-08 MED ORDER — SODIUM CHLORIDE 0.9 % IV SOLN
1.0000 g | Freq: Once | INTRAVENOUS | Status: AC
  Administered 2017-08-08: 1 g via INTRAVENOUS
  Filled 2017-08-08: qty 10

## 2017-08-08 MED ORDER — DEXTROSE 50 % IV SOLN
1.0000 | Freq: Once | INTRAVENOUS | Status: AC
  Administered 2017-08-08: 50 mL via INTRAVENOUS
  Filled 2017-08-08: qty 50

## 2017-08-08 MED ORDER — ALBUTEROL SULFATE (2.5 MG/3ML) 0.083% IN NEBU
5.0000 mg | INHALATION_SOLUTION | Freq: Once | RESPIRATORY_TRACT | Status: AC
  Administered 2017-08-08: 5 mg via RESPIRATORY_TRACT
  Filled 2017-08-08: qty 6

## 2017-08-08 MED ORDER — VANCOMYCIN HCL IN DEXTROSE 1-5 GM/200ML-% IV SOLN
1000.0000 mg | Freq: Once | INTRAVENOUS | Status: AC
  Administered 2017-08-08: 1000 mg via INTRAVENOUS
  Filled 2017-08-08: qty 200

## 2017-08-08 MED ORDER — ALBUTEROL SULFATE (2.5 MG/3ML) 0.083% IN NEBU
10.0000 mg | INHALATION_SOLUTION | Freq: Once | RESPIRATORY_TRACT | Status: AC
  Administered 2017-08-09: 10 mg via RESPIRATORY_TRACT
  Filled 2017-08-08: qty 12

## 2017-08-08 NOTE — Consult Note (Signed)
CARDIOLOGY CONSULT NOTE   Referring Physician: Dr. Sherry Ruffing Primary Physician: Dr. Arelia Sneddon Primary Cardiologist: Hilty/Klein Reason for Consultation: bradycardia   HPI: Gilbert Dominguez is a 71 yo man with PMH of atrial flutter (on eliquis, digoxin, diltiazem), chronic diastolic heart failure, chronic respiratory failure on 4L [place], possible pulmonary fibrosis, pulmonary HTN, stage 3 CKD, and DM2 who is seen in consult at the request of Dr. Sherry Ruffing for management of bradycardia. The patient reports that he has felt poorly for several days with decreased eating and drinking. He had diarrhea this afternoon as well. This evening he went to get into his truck and felt short of breath and lightheaded. He denied chest pains or palpitations. He did not lose consciousness but slid to the ground and scraped his leg. He then had a second similar episode, and EMS was called. On arrival to the ER, he was receiving a nebulizer treatment and then had a very low heart rate (per report, around 10, not yet on telemetry). Per ER physician, never completely went pulseless. His rhythm returned to the 50s.  Patient reports being very short of breath. Has felt subjective fevers and chills. Has a chronic cough with minimal change in sputum. Poor PO intake with minimal urine output over the last few days. He continues to feel nauseated. No changes in vision. No current abdominal pain.  Review of Systems:     Cardiac Review of Systems: {Y] = yes _0  = no  Chest Pain [  N  ]  Resting SOB [  Y ] Exertional SOB  [ Y ]  Orthopnea Gilbert Dominguez  ]   Pedal Edema [ Y  ]    Palpitations [ N ] Syncope  [ N ]   Presyncope [Y   ]  General Review of Systems: [Y] = yes [  ]=no Constitional: recent weight change [  ]; anorexia [Y  ]; fatigue [Y  ]; nausea [  Y]; night sweats [  ]; fever [Y  ]; or chills [ Y ];                                                                     Eyes : blurred vision [ N ]; diplopia [ N  ]; vision changes Aqua.Slicker  ];   Amaurosis fugax[  ]; Resp: cough [ Y ];  wheezing[ Y ];  hemoptysis[N  ];  PND [ Y ];  GI:  gallstones[  ], vomiting[ N ];  dysphagia[  ]; melena[  ];  hematochezia [  ]; heartburn[  ];   GU: kidney stones [  ]; hematuria[  ];   dysuria [  ];  nocturia[  ]; incontinence [  ];             Skin: rash, swelling[  ];, hair loss[  ];  peripheral edema[ Y ];  or itching[  ]; Musculosketetal: myalgias[  ];  joint swelling[  ];  joint erythema[  ];  joint pain[  ];  back pain[  ];  Heme/Lymph: bruising[  ];  Bleeding[Y site of injury  ];  anemia[  ];  Neuro: TIA[  ];  headaches[  ];  stroke[  ];  vertigo[  ];  seizures[  ];  paresthesias[  ];  difficulty walking[  ];  Psych:depression[  ]; anxiety[  ];  Endocrine: diabetes[  ];  thyroid dysfunction[  ];  Other:  Past Medical History:  Diagnosis Date  . Atrial flutter ([place])    a. initially diagnosed in 04/2017, started on Eliquis b. recurrent in 05/2017  . CHF exacerbation ([place]) 02/24/2017  . Chronic respiratory failure ([place])   . Hypertension   . 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 Rheumatoid arthritis(714.0)      (Not in a hospital admission)   . albuterol  10 mg Nebulization Once  . dextrose  1 ampule Intravenous Once  . insulin aspart  10 Units Intravenous Once  . Tdap  0.5 mL Intramuscular Once    Infusions: . calcium gluconate 1 GM IV    . piperacillin-tazobactam    . vancomycin      Allergies  Allergen Reactions  . Lovastatin Rash    Social History   Social History  . Marital status: Single    Spouse name: N/A  . Number of children: 2  . Years of education: N/A   Occupational History  . Retired Optometrist    Social History Main Topics  . Smoking status: Former Smoker    Packs/day: 1.50    Years: 37.00    Types: Cigarettes    Quit date: 10/14/1995  . Smokeless tobacco: Never Used  . Alcohol use 1.8 oz/week    3 Cans of beer per week     Comment: occasionally  . Drug use: No  . Sexual activity:  Not on file   Other Topics Concern  . Not on file   Social History Narrative  . No narrative on file    Family History  Problem Relation Age of Onset  . Heart disease Father   . Emphysema Father   . Liver cancer Mother     PHYSICAL EXAM: Vitals:   08/12/2017 2245 08/07/2017 2300  BP: 106/78 102/73  Pulse:  (!) 55  Resp: (!) 29 (!) 21  Temp:    SpO2:  98%    No intake or output data in the 24 hours ending 08/17/2017 2325  General:  Very frail appearing, retracting with prolonged speaking, pale HEENT:  Neck: supple. Difficult to assess JVD with retractions. No lymphadenopathy or thryomegaly appreciated. Cor: PMI nondisplaced. Regular rate & rhythm. 2/6 holosystolic murmur Lungs: coarse bilaterally Abdomen: soft, nontender, nondistended.  Extremities: no cyanosis, clubbing, rash. Trace bilateral edema. Scrape on lower extremity with evidence of recent bleeding Neuro: alert & oriented x 3, cranial nerves grossly intact. moves all 4 extremities w/o difficulty.  ECG: junctional rhythm with sagging ST segments most prominent in lead III. Different from prior ECGs  Results for orders placed or performed during the hospital encounter of 08/17/2017 (from the past 24 hour(s))  CBC with Differential     Status: Abnormal   Collection Time: 07/20/2017 10:46 PM  Result Value Ref Range   WBC 15.8 (H) 4.0 - 10.5 K/uL   RBC 3.87 (L) 4.22 - 5.81 MIL/uL   Hemoglobin 10.7 (L) 13.0 - 17.0 g/dL   HCT 34.6 (L) 39.0 - 52.0 %   MCV 89.4 78.0 - 100.0 fL   MCH 27.6 26.0 - 34.0 pg   MCHC 30.9 30.0 - 36.0 g/dL   RDW 17.6 (H) 11.5 - 15.5 %   Platelets 297 150 - 400 K/uL   Neutrophils Relative % 84 %   Neutro Abs 13.2 (H) 1.7 - 7.7 K/uL  Lymphocytes Relative 6 %   Lymphs Abs 1.0 0.7 - 4.0 K/uL   Monocytes Relative 10 %   Monocytes Absolute 1.6 (H) 0.1 - 1.0 K/uL   Eosinophils Relative 0 %   Eosinophils Absolute 0.0 0.0 - 0.7 K/uL   Basophils Relative 0 %   Basophils Absolute 0.0 0.0 - 0.1 K/uL    I-Stat Troponin, ED - 0, 3, 6 hours (not at [facility])     Status: Abnormal   Collection Time: 08/03/2017 11:00 PM  Result Value Ref Range   Troponin i, poc 0.11 (HH) 0.00 - 0.08 ng/mL   Comment NOTIFIED PHYSICIAN    Comment 3          I-Stat Chem 8, ED     Status: Abnormal   Collection Time: 07/30/2017 11:02 PM  Result Value Ref Range   Sodium 131 (L) 135 - 145 mmol/L   Potassium 5.9 (H) 3.5 - 5.1 mmol/L   Chloride 96 (L) 101 - 111 mmol/L   BUN 34 (H) 6 - 20 mg/dL   Creatinine, Ser 1.60 (H) 0.61 - 1.24 mg/dL   Glucose, Bld 209 (H) 65 - 99 mg/dL   Calcium, Ion 0.98 (L) 1.15 - 1.40 mmol/L   TCO2 18 (L) 22 - 32 mmol/L   Hemoglobin 11.2 (L) 13.0 - 17.0 g/dL   HCT 33.0 (L) 39.0 - 52.0 %  I-Stat CG4 Lactic Acid, ED     Status: Abnormal   Collection Time: 07/22/2017 11:03 PM  Result Value Ref Range   Lactic Acid, Venous 11.58 (HH) 0.5 - 1.9 mmol/L   Comment NOTIFIED PHYSICIAN    No results found.  ASSESSMENT/RECOMMENDATIONS: Gilbert Dominguez is a 71 yo man with PMH of atrial flutter (on eliquis, digoxin, diltiazem), chronic diastolic heart failure, chronic respiratory failure on 4L [place], possible pulmonary fibrosis, pulmonary HTN, stage 3 CKD, and DM2 who is seen in consult at the request of Dr. Sherry Ruffing for management of bradycardia.   Overall, his new ECG changes including junctional rhythm and intermittent bradycardia, sagging ST segments, diarrhea, hyperkalemia are concerning for digoxin toxicity. Dig level sent and returned at 2.1, higher than his baseline level of 1.2 just one month ago. Given his poor PO intake, AKI (Cr 1.83 from 0.99) from dehydration may have acutely raised his digoxin to toxic levels.  Given his symptoms, rhythm disturbance, brief episode of extreme bradycardia, and overall critical illness (lactate of 11), recommend treatment with digibind for his rhythm disturbance.  Unclear what the initial inciting factor is for his critical illness-agree with consult to critical care for  further treatment. Would keep pacing pads on patient and defibrillator in room. Note that digoxin level will also detect drug bound to digibind, so do not recommend acute repeat measurement of digoxin level in the short term.  Further management per ER and PCCM, but please let us know if we can assist in Gilbert Dominguez's care further overnight.  Buford Dresser, MD, PhD, overnight cardiology provider.

## 2017-08-08 NOTE — Assessment & Plan Note (Addendum)
Echo 12/08/16  size with EF 55-60%. D-shaped interventricular septum   suggestive of RV pressure/volume overload. 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 Severe pulmonary   hypertension. Mild aortic stenosis. - CTa 12/08/16 neg PE . 03/2017 V/Q > low prob for PE  Left ventricle: The cavity size was mildly reduced. Systolic   function was normal. The estimated ejection fraction was in the   range of 55% to 60%. Wall motion was normal; there were no   regional wall motion abnormalities. Doppler parameters are   consistent with abnormal left ventricular relaxation (grade 1   diastolic dysfunction). - Ventricular septum: The contour showed diastolic flattening and   systolic flattening. These changes are consistent with RV volume   and pressure overload. - Aortic valve: There was very mild stenosis. Valve area (VTI):   1.85 cm^2. Valve area (Vmax): 1.61 cm^2. Valve area (Vmean): 1.5   cm^2. - Right ventricle: The cavity size was severely dilated. Systolic   function was moderately to severely reduced. - Right atrium: The atrium was severely dilated. - Tricuspid valve: There was moderate-severe regurgitation directed   eccentrically. - Pulmonary arteries: Systolic pressure was severely increased. PA   peak pressure: 87 mm Hg (S) > referred to Dr [place] Bells   bnp trending up so rec take extra bumex daily  consider adding aldactone next if k levels allow given he has dm - note already struggles with med reconciliation and has multiple centers of care which don't use epic so if he wants to continue to receive quality care from this clinic needs to achieve/ maintain 100% med reconciliation/ adherence:   To keep things simple, I have asked the patient to first separate medicines that are perceived as maintenance, that is to be taken daily "no matter what", from those medicines that are taken on only on an as-needed basis and I have given the patient examples of both, and then return to  see our NP to generate a  detailed  medication calendar which should be followed until the next physician sees the patient and updates it.

## 2017-08-08 NOTE — ED Triage Notes (Signed)
Pt. Arrival via EMS. Pt. States he went to get in his truck and felt SOB and dizzy. Pt. States he did not fall. Pt. Scraped knee on gravel when he slowly went to the ground. Pt. On 4L at home. Pt was 88% SPO2 on 4 L. Pt. Reports no pain. Pt. Has HX. Of Afib.

## 2017-08-08 NOTE — Assessment & Plan Note (Signed)
-  RA sats 62% 11/23/2013  - 11/23/2013   Walked 4lpm  x one lap @ 185 stopped due to desat to 73%  - 12/08/2013  Rest 94% RA,  Walked 4lpm x 3 laps @ 185 ft each stopped due to  End of study, sats still 94%  - 03/08/2014  Walked RA  2 laps @ 185 ft each stopped due to sats 86 corrected on 2lpm   - 12/12/2014  Walked RA x 2 laps @ 185 ft each stopped due to  sats 84 nl pace  - 04/11/2015  Walked RA x 2 laps @ 185 ft each stopped due to sats 83%nl pace  - 11/19/2015   Walked RA x one lap @ 185 stopped due to  83% nl pace/ resolved on 2lpm     rec as of  08/09/2017 >>>  Adjust for sats > 90% at all times

## 2017-08-08 NOTE — ED Notes (Signed)
Pt reports he does not feel well, temors noted. Pt's extremities cool.

## 2017-08-08 NOTE — ED Provider Notes (Signed)
[facility] EMERGENCY DEPARTMENT Provider Note   CSN: [id number] Arrival date & time: 08/11/2017  2208     History   Chief Complaint Chief Complaint  Patient presents with  . Shortness of Breath    HPI Gilbert Dominguez is a 71 y.o. male.  The history is provided by the patient and medical records. The history is limited by the condition of the patient.  Dizziness  Quality:  Lightheadedness Severity:  Severe Onset quality:  Gradual Duration:  1 day Timing:  Intermittent Progression:  Waxing and waning Chronicity:  New Relieved by:  Nothing Worsened by:  Nothing Associated symptoms: diarrhea, nausea, shortness of breath and syncope   Associated symptoms: no chest pain, no headaches, no palpitations and no vomiting   Risk factors: heart disease     Past Medical History:  Diagnosis Date  . Atrial flutter ([place])    a. initially diagnosed in 04/2017, started on Eliquis b. recurrent in 05/2017  . CHF exacerbation ([place]) 02/24/2017  . Chronic respiratory failure ([place])   . Hypertension   . 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 Rheumatoid arthritis(714.0)     Patient Active Problem List   Diagnosis Date Noted  . Typical atrial flutter ([place])   . Hyponatremia 06/10/2017  . Acute kidney injury ([place]) 06/10/2017  . HCAP (healthcare-associated pneumonia) 06/10/2017  . Hypomagnesemia   . High anion gap metabolic acidosis 86/57/8469  . Transaminitis 05/25/2017  . Positive D-dimer   . Chronic atrial flutter ([place])   . Pulmonary HTN ([place])   . Hypokalemia 04/28/2017  . Hyperglycemia 04/28/2017  . Atrial tachycardia ([place])   . Tachycardia 04/08/2017  . Chronic diastolic CHF (congestive heart failure) ([place])   . Pulmonary hypertension ([place])   . 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 CKD (chronic kidney disease) stage 3, GFR 30-59 ml/min (HCC) 02/25/2017  . Malnutrition of moderate degree 02/25/2017  . CHF exacerbation ([place])  02/24/2017  . 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 Acute on chronic respiratory failure with hypoxia ([place]) 12/08/2016  . Fluid overload 12/08/2016  . Elevated troponin 12/08/2016  . Hyperlipidemia 12/08/2016  . Anemia 12/08/2016  . Essential hypertension 11/19/2015  . Cough 09/05/2014  . Postinflammatory pulmonary fibrosis ([place]) 11/23/2013  . Dyspnea 11/23/2013  . Chronic respiratory failure with hypoxia ([place]) 11/23/2013    Past Surgical History:  Procedure Laterality Date  . ACNE CYST REMOVAL  1968  . [place] Medications    Prior to Admission medications   Medication Sig Start Date End Date Taking? Authorizing Provider  acetaminophen (TYLENOL) 500 MG tablet Take 1,000 mg by mouth every 6 (six) hours as needed for mild pain.    [provider]  Amino Acids-Protein Hydrolys (FEEDING SUPPLEMENT, PRO-STAT SUGAR FREE 64,) LIQD Take 30 mLs by mouth 2 (two) times daily. 05/06/17   Johnson, Clanford L, MD  apixaban (ELIQUIS) 5 MG TABS tablet Take 1 tablet (5 mg total) by mouth 2 (two) times daily. 05/06/17   Johnson, Clanford L, MD  bumetanide (BUMEX) 2 MG tablet Take 1 tablet (2 mg total) by mouth 2 (two) times daily. 05/06/17   Johnson, Clanford L, MD  Calcium Carbonate-Vit D-Min (CALCIUM 600+D PLUS MINERALS PO) Take 1 tablet by mouth 2 (two) times daily.    [provider]  dextromethorphan-guaiFENesin (MUCINEX DM) 30-600 MG 12hr tablet Take 1 tablet by mouth 2 (two) times daily.  [provider]  digoxin (LANOXIN) 0.25 MG tablet Take 1 tablet (0.25 mg total) by mouth daily. 06/20/17   Nita Sells, MD  diltiazem (CARDIZEM CD) 360 MG 24 hr capsule Take 1 capsule (360 mg total) by mouth daily. 06/07/17   Pixie Casino, MD  famotidine (PEPCID) 20 MG tablet Take 1 tablet (20 mg total) by mouth at bedtime. 01/27/17   Tanda Rockers, MD  insulin glargine (LANTUS) 100 UNIT/ML injection Inject 0.08  mLs (8 Units total) into the skin daily. 06/20/17   Nita Sells, MD  loratadine (CLARITIN) 10 MG tablet Take 10 mg by mouth daily.    [provider]  magnesium oxide (MAG-OX) 400 (241.3 Mg) MG tablet Take 1 tablet (400 mg total) by mouth daily. ** REFILLS ON HOLD WHILE PT IS IN SNF 10/4 ** 07/22/17   Parrett, Tammy S, NP  metFORMIN (GLUCOPHAGE XR) 500 MG 24 hr tablet Take 2 tabs with breakfast and 2 tabs with supper Patient taking differently: Take 1,000 mg by mouth 2 (two) times daily.  05/06/17   Johnson, Clanford L, MD  OXYGEN Inhale 4-8 L into the lungs See admin instructions. 4 liters CONTINUOUSLY and to titrate up to 8 liters CONTINUOUSLY (with exertion) "to keep sats above 90" ([employer])    [provider]  pantoprazole (PROTONIX) 40 MG tablet TAKE 1 TABLET BY MOUTH EVERY DAY *TAKE 30-60 MINUTES BEFORE FIRST MEAL OF THE DAY* 06/28/17   Tanda Rockers, MD  potassium chloride SA (K-DUR,KLOR-CON) 20 MEQ tablet Take 1 tablet (20 mEq total) by mouth daily. 12/12/16   Bonnielee Haff, MD  predniSONE (DELTASONE) 20 MG tablet Take 1 tablet (20 mg total) by mouth daily with breakfast. 06/20/17   Nita Sells, MD  tamsulosin (FLOMAX) 0.4 MG CAPS capsule Take 0.4 mg by mouth daily after supper.    [provider]  terazosin (HYTRIN) 2 MG capsule Take 2 mg by mouth at bedtime.    [provider]    Family History Family History  Problem Relation Age of Onset  . Heart disease Father   . Emphysema Father   . Liver cancer Mother     Social History Social History  Substance Use Topics  . Smoking status: Former Smoker    Packs/day: 1.50    Years: 37.00    Types: Cigarettes    Quit date: 10/14/1995  . Smokeless tobacco: Never Used  . Alcohol use 1.8 oz/week    3 Cans of beer per week     Comment: occasionally     Allergies   Lovastatin   Review of Systems Review of Systems  Constitutional: Positive for fatigue. Negative for chills,  diaphoresis and fever.  HENT: Negative for congestion.   Eyes: Negative for visual disturbance.  Respiratory: Positive for cough and shortness of breath. Negative for chest tightness, wheezing and stridor.   Cardiovascular: Positive for leg swelling (bilateral leg edema for several days) and syncope. Negative for chest pain and palpitations.  Gastrointestinal: Positive for diarrhea and nausea. Negative for abdominal pain and vomiting.  Genitourinary: Negative for dysuria and flank pain.  Musculoskeletal: Negative for back pain, neck pain and neck stiffness.  Skin: Negative for rash and wound.  Neurological: Positive for syncope and light-headedness. Negative for dizziness and headaches.  Psychiatric/Behavioral: Negative for agitation.  All other systems reviewed and are negative.    Physical Exam Updated Vital Signs BP 106/78   Pulse (!) 59   Temp 98.6 F (37 C) (Oral)  Resp (!) 29   Ht _0  (1.6 m)   Wt 47.6 kg (105 lb)   SpO2 96%   BMI 18.60 kg/m   Physical Exam  Constitutional: He appears well-developed and well-nourished. He appears distressed.  HENT:  Head: Normocephalic and atraumatic.  Mouth/Throat: Oropharynx is clear and moist. No oropharyngeal exudate.  Eyes: Pupils are equal, round, and reactive to light. Conjunctivae and EOM are normal.  Neck: Normal range of motion.  Cardiovascular: Bradycardia present.   No murmur heard. Pulmonary/Chest: Effort normal. No stridor. No respiratory distress. He has no wheezes. He exhibits no tenderness.  Abdominal: Soft. There is no tenderness.  Musculoskeletal: He exhibits no tenderness.  Neurological: He is unresponsive. No sensory deficit. He exhibits normal muscle tone.  Skin: Capillary refill takes less than 2 seconds. He is diaphoretic.  Nursing note and vitals reviewed.    ED Treatments / Results  Labs (all labs ordered are listed, but only abnormal results are displayed) Labs Reviewed  CBC WITH  DIFFERENTIAL/PLATELET - Abnormal; Notable for the following:       Result Value   WBC 15.8 (*)    RBC 3.87 (*)    Hemoglobin 10.7 (*)    HCT 34.6 (*)    RDW 17.6 (*)    Neutro Abs 13.2 (*)    Monocytes Absolute 1.6 (*)    All other components within normal limits  COMPREHENSIVE METABOLIC PANEL - Abnormal; Notable for the following:    Sodium 129 (*)    Potassium 5.8 (*)    Chloride 95 (*)    CO2 16 (*)    Glucose, Bld 204 (*)    BUN 35 (*)    Creatinine, Ser 1.83 (*)    Calcium 8.4 (*)    Total Protein 6.4 (*)    Albumin 3.2 (*)    AST 56 (*)    GFR calc non Af Amer 35 (*)    GFR calc Af Amer 41 (*)    Anion gap 18 (*)    All other components within normal limits  BRAIN NATRIURETIC PEPTIDE - Abnormal; Notable for the following:    B Natriuretic Peptide 1,351.2 (*)    All other components within normal limits  DIGOXIN LEVEL - Abnormal; Notable for the following:    Digoxin Level 2.1 (*)    All other components within normal limits  PROTIME-INR - Abnormal; Notable for the following:    Prothrombin Time 19.9 (*)    All other components within normal limits  I-STAT TROPONIN, ED - Abnormal; Notable for the following:    Troponin i, poc 0.11 (*)    All other components within normal limits  I-STAT CHEM 8, ED - Abnormal; Notable for the following:    Sodium 131 (*)    Potassium 5.9 (*)    Chloride 96 (*)    BUN 34 (*)    Creatinine, Ser 1.60 (*)    Glucose, Bld 209 (*)    Calcium, Ion 0.98 (*)    TCO2 18 (*)    Hemoglobin 11.2 (*)    HCT 33.0 (*)    All other components within normal limits  I-STAT CG4 LACTIC ACID, ED - Abnormal; Notable for the following:    Lactic Acid, Venous 11.58 (*)    All other components within normal limits  I-STAT CG4 LACTIC ACID, ED - Abnormal; Notable for the following:    Lactic Acid, Venous 9.20 (*)    All other components within normal limits  CULTURE,  BLOOD (ROUTINE X 2)  CULTURE, BLOOD (ROUTINE X 2)  URINALYSIS, ROUTINE W REFLEX  MICROSCOPIC  MAGNESIUM  PHOSPHORUS  BASIC METABOLIC PANEL  CBC  SODIUM, URINE, RANDOM  I-STAT TROPONIN, ED    EKG  EKG Interpretation  Date/Time:  Monday August 09 2017 00:11:10 EDT Ventricular Rate:  57 PR Interval:    QRS Duration: 109 QT Interval:  407 QTC Calculation: 397 R Axis:   130 Text Interpretation:  Atrial fibrillation RVH with secondary repolarization abnrm When compared with ECG of 07/21/2017, No significant change was found Confirmed by Delora Fuel ([id number]) on 08/09/2017 1:36:06 AM       Radiology Dg Chest Portable 1 View  Result Date: 08/16/2017 CLINICAL DATA:  Acute onset of shortness of breath and dizziness. Briefly pulseless. Initial encounter. EXAM: PORTABLE CHEST 1 VIEW COMPARISON:  Chest radiograph performed 07/07/2017 FINDINGS: The lungs are well-aerated. Chronic bibasilar airspace opacities likely reflect the patient's chronic interstitial lung disease. No definite superimposed airspace consolidation is seen. There is no evidence of pleural effusion or pneumothorax. The cardiomediastinal silhouette is mildly enlarged. External pacing pads are noted. No acute osseous abnormalities are seen. IMPRESSION: 1. Chronic bibasilar airspace opacities likely reflect the patient's chronic interstitial lung disease. No definite superimposed airspace consolidation seen. 2. Mild cardiomegaly. Electronically Signed   By: Garald Balding M.D.   On: 08/02/2017 23:41    Procedures Procedures (including critical care time)  CRITICAL CARE Performed by: Gwenyth Allegra Tegeler Total critical care time: 75 minutes Critical care time was exclusive of separately billable procedures and treating other patients. Critical care was necessary to treat or prevent imminent or life-threatening deterioration. Critical care was time spent personally by me on the following activities: development of treatment plan with patient and/or surrogate as well as nursing, discussions with  consultants, evaluation of patient's response to treatment, examination of patient, obtaining history from patient or surrogate, ordering and performing treatments and interventions, ordering and review of laboratory studies, ordering and review of radiographic studies, pulse oximetry and re-evaluation of patient's condition.    Medications Ordered in ED Medications  0.9 %  sodium chloride infusion (not administered)  heparin injection 5,000 Units (not administered)  lactated ringers bolus 500 mL (500 mLs Intravenous New Bag/Given 08/09/17 0205)  vancomycin (VANCOCIN) 500 mg in sodium chloride 0.9 % 100 mL IVPB (not administered)  ceFEPIme (MAXIPIME) 1 g in dextrose 5 % 50 mL IVPB (not administered)  predniSONE (DELTASONE) tablet 20 mg (not administered)  ipratropium-albuterol (DUONEB) 0.5-2.5 (3) MG/3ML nebulizer solution 3 mL (not administered)  albuterol (PROVENTIL) (2.5 MG/3ML) 0.083% nebulizer solution 5 mg (5 mg Nebulization Given 08/12/2017 2219)  piperacillin-tazobactam (ZOSYN) IVPB 3.375 g (0 g Intravenous Stopped 08/09/17 0037)  vancomycin (VANCOCIN) IVPB 1000 mg/200 mL premix (0 mg Intravenous Stopped 08/09/17 0057)  Tdap (BOOSTRIX) injection 0.5 mL (0.5 mLs Intramuscular Given 08/07/2017 2349)  calcium gluconate 1 g in sodium chloride 0.9 % 100 mL IVPB (0 g Intravenous Stopped 08/09/17 0037)  albuterol (PROVENTIL) (2.5 MG/3ML) 0.083% nebulizer solution 10 mg (10 mg Nebulization Given 08/09/17 0043)  insulin aspart (novoLOG) injection 10 Units (10 Units Intravenous Given 07/19/2017 2354)  dextrose 50 % solution 50 mL (50 mLs Intravenous Given 08/14/2017 2349)  sodium chloride 0.9 % bolus 500 mL (0 mLs Intravenous Stopped 08/09/17 0005)  digoxin immune fab (DIGIFAB) 4 vial in sodium chloride 0.9 % 50 mL IVPB (0 vials Intravenous Stopped 08/09/17 0111)     Initial Impression / Assessment and Plan / ED Course  I have reviewed the triage vital signs and the nursing notes.  Pertinent labs  & imaging results that were available during my care of the patient were reviewed by me and considered in my medical decision making (see chart for details).     Tavares Levinson is a 71 y.o. male with a past history significant for CHF on digoxin, atrial fibrillation on diltiazem and recent hospital associated pneumonia, and chronic pulmonary fibrosis who presents to the emergency department with lightheadedness/near syncopal episode, continued cough, recent diarrhea, and fatigue.  Patient reports that he was admitted to the hospital several weeks ago for pneumonia.  He says that he was discharged on antibiotics but has completed them.  He reports that today he was feeling fatigued and lightheaded.  He reported that he had a near syncopal episode while getting into his truck and he scraped his right leg on the truck.  Patient sustained several skin tears on the right leg that are hemostatic.  Unsure of last tetanus update, will give Tdap today.  Patient denied any chest pain but reported shortness of breath.  Patient says that he did not lose consciousness during the lightheaded spell but called for help.  Patient was brought to the ED for evaluation.  Initial EKG showed a junctional rhythm that was borderline bradycardic.  While nursing was evaluating the patient, he went unresponsive.  Patient was asystolic/having extremely bradycardic rate in the low 10's.  While CPR was getting ready to be initiated and pads were being placed, patient returned to a rate in the 50s and woke up.  Patient denied chest pain or palpitations but had a syncopal episode.  Patient confirmed at that time that if his cause of syncope was reversible he wanted everything to be done.  Did not feel patient needed pacing at that time as his mental status improved and he no longer was severely bradycardic.  Cardiology quickly called who came to the bedside.  Laboratory testing began to return showing lactic acidosis of greater  than 11 and a leukocytosis.  Bedside review of x-ray revealed concern for fluid overload versus pneumonia.  Given the elevated lactic acid and report of cough in the setting of recent pneumonia, patient empirically treated with broad-spectrum antibiotics.  Laboratory testing returned showing hyperkalemia and elevated digoxin level.  Cardiology recommended Digibind administration and treating the hyperkalemia.  Calcium, insulin with D50, and albuterol was ordered for the hyperkalemia.  Critical care was also called and will admit the patient to the ICU.  Patient will be admitted for sepsis versus digitalis overdose versus cardiac etiology of his syncope and hypotension.      Final Clinical Impressions(s) / ED Diagnoses   Final diagnoses:  Symptomatic bradycardia  Syncope, unspecified syncope type  Poisoning by digitalis glycoside, accidental or unintentional, initial encounter  Sepsis, due to unspecified organism [facility])  Skin tear of lower leg without complication, right, initial encounter  Hyperkalemia  Lactic acidosis  AKI (acute kidney injury) ([place])     Clinical Impression: 1. Symptomatic bradycardia   2. Acute renal failure (ARF) ([place])   3. Syncope, unspecified syncope type   4. Poisoning by digitalis glycoside, accidental or unintentional, initial encounter   5. Sepsis, due to unspecified organism ([place])   6. Skin tear of lower leg without complication, right, initial encounter   7. Hyperkalemia   8. Lactic acidosis   9. AKI (acute kidney injury) ([place])     Disposition: Admit to Critical care    Tegeler, Gwenyth Allegra,  MD 08/09/17 [id number]

## 2017-08-08 NOTE — ED Notes (Signed)
Pt. Completed Neb treatment . Pt. Went unconscious and went pulseless for 10 seconds.Pt. Placed on [place].  MD at bedside. Respiratory at bedside. Cardiology paged.

## 2017-08-08 NOTE — ED Notes (Signed)
Pt c/o nausea, dry heaving noted

## 2017-08-08 NOTE — Assessment & Plan Note (Addendum)
-  pfts 09/27/14        VC 2.2(59%) no obst with dlco 38 corrects to 63 - PFTs 09/04/2014 VC 2.3 (62%) no obst with dlco 38 corrects to 83%  - RA sats 62% 11/23/2013 at rest > started 24h 02 (see chronic respiratory failure)  - 12/12/2014  Walked RA  2 laps @ 185 ft each stopped due to  desat to 84% nl pace  - 11/23/2013 ESR 95 rx pred 20 mg bid  >  12/08/2013 = 14 > rx per [place]  - PFTs 04/11/2015    VC 2.1 s obst and dlco 41 corrects to 75% - ESR 11/19/2015 = 14  - ESR 12/31/2015 =  48 and worse sob/cough > try prednisone 20 mg daily until better and then 10 mg maintenance until follow-up office visit. - PFTs  04/20/2016    VC 2.15 (60%) s obst and dlco 23/22 and corrects to 47%  - 07/21/2016 Referred to rehab > started early Nov 2017 - PFTs 02/02/2017   VC 2.12 (64%) and dlco  35/34 and dlco 46%    Relatively stable but steroid dep at 20 mg daily > rheumatology f/u needed but for now would leave the pred dose at 20 mg daily    I had an extended discussion with the patient and son reviewing all relevant studies completed to date(in EPIC and are everywhere)    Each maintenance medication was reviewed in detail including most importantly the difference between maintenance and prns and under what circumstances the prns are to be triggered using an action plan format that is not reflected in the computer generated alphabetically organized AVS.    Please see AVS for specific instructions unique to this visit that I personally wrote and verbalized to the the pt in detail and then reviewed with pt  by my nurse highlighting any  changes in therapy recommended at today's visit to their plan of care.

## 2017-08-09 ENCOUNTER — Inpatient Hospital Stay (HOSPITAL_COMMUNITY): Payer: Medicare Other

## 2017-08-09 DIAGNOSIS — Z7952 Long term (current) use of systemic steroids: Secondary | ICD-10-CM | POA: Diagnosis not present

## 2017-08-09 DIAGNOSIS — E871 Hypo-osmolality and hyponatremia: Secondary | ICD-10-CM | POA: Diagnosis present

## 2017-08-09 DIAGNOSIS — J84112 Idiopathic pulmonary fibrosis: Secondary | ICD-10-CM

## 2017-08-09 DIAGNOSIS — Z23 Encounter for immunization: Secondary | ICD-10-CM | POA: Diagnosis present

## 2017-08-09 DIAGNOSIS — T460X5A Adverse effect of cardiac-stimulant glycosides and drugs of similar action, initial encounter: Secondary | ICD-10-CM | POA: Diagnosis present

## 2017-08-09 DIAGNOSIS — J9621 Acute and chronic respiratory failure with hypoxia: Secondary | ICD-10-CM

## 2017-08-09 DIAGNOSIS — Z794 Long term (current) use of insulin: Secondary | ICD-10-CM | POA: Diagnosis not present

## 2017-08-09 DIAGNOSIS — I272 Pulmonary hypertension, unspecified: Secondary | ICD-10-CM | POA: Diagnosis present

## 2017-08-09 DIAGNOSIS — R579 Shock, unspecified: Secondary | ICD-10-CM | POA: Diagnosis not present

## 2017-08-09 DIAGNOSIS — M069 Rheumatoid arthritis, unspecified: Secondary | ICD-10-CM | POA: Diagnosis present

## 2017-08-09 DIAGNOSIS — D638 Anemia in other chronic diseases classified elsewhere: Secondary | ICD-10-CM | POA: Diagnosis present

## 2017-08-09 DIAGNOSIS — I4892 Unspecified atrial flutter: Secondary | ICD-10-CM | POA: Diagnosis present

## 2017-08-09 DIAGNOSIS — T460X1A Poisoning by cardiac-stimulant glycosides and drugs of similar action, accidental (unintentional), initial encounter: Secondary | ICD-10-CM | POA: Insufficient documentation

## 2017-08-09 DIAGNOSIS — Z9981 Dependence on supplemental oxygen: Secondary | ICD-10-CM | POA: Diagnosis not present

## 2017-08-09 DIAGNOSIS — Z66 Do not resuscitate: Secondary | ICD-10-CM | POA: Diagnosis present

## 2017-08-09 DIAGNOSIS — T460X1D Poisoning by cardiac-stimulant glycosides and drugs of similar action, accidental (unintentional), subsequent encounter: Secondary | ICD-10-CM

## 2017-08-09 DIAGNOSIS — E875 Hyperkalemia: Secondary | ICD-10-CM | POA: Diagnosis present

## 2017-08-09 DIAGNOSIS — E872 Acidosis: Secondary | ICD-10-CM | POA: Diagnosis present

## 2017-08-09 DIAGNOSIS — Z87891 Personal history of nicotine dependence: Secondary | ICD-10-CM | POA: Diagnosis not present

## 2017-08-09 DIAGNOSIS — N183 Chronic kidney disease, stage 3 (moderate): Secondary | ICD-10-CM | POA: Diagnosis not present

## 2017-08-09 DIAGNOSIS — I130 Hypertensive heart and chronic kidney disease with heart failure and stage 1 through stage 4 chronic kidney disease, or unspecified chronic kidney disease: Secondary | ICD-10-CM | POA: Diagnosis present

## 2017-08-09 DIAGNOSIS — I361 Nonrheumatic tricuspid (valve) insufficiency: Secondary | ICD-10-CM

## 2017-08-09 DIAGNOSIS — R001 Bradycardia, unspecified: Secondary | ICD-10-CM | POA: Insufficient documentation

## 2017-08-09 DIAGNOSIS — Z79899 Other long term (current) drug therapy: Secondary | ICD-10-CM | POA: Diagnosis not present

## 2017-08-09 DIAGNOSIS — I2721 Secondary pulmonary arterial hypertension: Secondary | ICD-10-CM | POA: Diagnosis not present

## 2017-08-09 DIAGNOSIS — T380X5A Adverse effect of glucocorticoids and synthetic analogues, initial encounter: Secondary | ICD-10-CM | POA: Diagnosis present

## 2017-08-09 DIAGNOSIS — Z7901 Long term (current) use of anticoagulants: Secondary | ICD-10-CM | POA: Diagnosis not present

## 2017-08-09 DIAGNOSIS — I483 Typical atrial flutter: Secondary | ICD-10-CM | POA: Diagnosis not present

## 2017-08-09 DIAGNOSIS — N179 Acute kidney failure, unspecified: Secondary | ICD-10-CM | POA: Diagnosis present

## 2017-08-09 DIAGNOSIS — J849 Interstitial pulmonary disease, unspecified: Secondary | ICD-10-CM | POA: Diagnosis not present

## 2017-08-09 DIAGNOSIS — I5032 Chronic diastolic (congestive) heart failure: Secondary | ICD-10-CM | POA: Diagnosis present

## 2017-08-09 DIAGNOSIS — E876 Hypokalemia: Secondary | ICD-10-CM | POA: Diagnosis present

## 2017-08-09 DIAGNOSIS — Z8249 Family history of ischemic heart disease and other diseases of the circulatory system: Secondary | ICD-10-CM | POA: Diagnosis not present

## 2017-08-09 LAB — ECHOCARDIOGRAM COMPLETE
AO mean calculated velocity dopler: 127 cm/s
AV Area VTI index: 1.17 cm2/m2
AV Area VTI: 1.76 cm2
AV Area mean vel: 1.93 cm2
AV Mean grad: 8 mmHg
AV Peak grad: 14 mmHg
AV VEL mean LVOT/AV: 0.68
AV area mean vel ind: 1.27 cm2/m2
AV peak Index: 1.16
AV pk vel: 187 cm/s
AV vel: 1.79
Ao pk vel: 0.62 m/s
E decel time: 180 msec
E/e' ratio: 5.51
FS: 29 % (ref 28–44)
Height: 63 in
IVS/LV PW RATIO, ED: 1.23
LA ID, A-P, ES: 42 mm
LA diam end sys: 42 mm
LA diam index: 2.76 cm/m2
LA vol A4C: 65.9 ml
LA vol index: 24.9 mL/m2
LA vol: 37.9 mL
LV E/e' medial: 5.51
LV E/e'average: 5.51
LV PW d: 11.1 mm — AB (ref 0.6–1.1)
LV dias vol index: 23 mL/m2
LV dias vol: 35 mL — AB (ref 62–150)
LV e' LATERAL: 14.7 cm/s
LV sys vol index: 9 mL/m2
LV sys vol: 14 mL — AB (ref 21–61)
LVOT SV: 48 mL
LVOT VTI: 16.9 cm
LVOT area: 2.84 cm2
LVOT diameter: 19 mm
LVOT peak VTI: 0.63 cm
LVOT peak grad rest: 5 mmHg
LVOT peak vel: 116 cm/s
Lateral S' vel: 9.03 cm/s
MV Dec: 180
MV Peak grad: 3 mmHg
MV pk E vel: 81 m/s
Reg peak vel: 345 cm/s
Simpson's disk: 60
Stroke v: 21 ml
TAPSE: 12.1 mm
TDI e' lateral: 14.7
TDI e' medial: 10
TR max vel: 345 cm/s
VTI: 26.8 cm
Valve area index: 1.17
Valve area: 1.79 cm2
Weight: 1844.81 oz

## 2017-08-09 LAB — URINALYSIS, ROUTINE W REFLEX MICROSCOPIC
Bilirubin Urine: NEGATIVE
GLUCOSE, UA: NEGATIVE mg/dL
KETONES UR: NEGATIVE mg/dL
Leukocytes, UA: NEGATIVE
Nitrite: NEGATIVE
PH: 5 (ref 5.0–8.0)
Protein, ur: 100 mg/dL — AB
Specific Gravity, Urine: 1.018 (ref 1.005–1.030)
Squamous Epithelial / LPF: NONE SEEN

## 2017-08-09 LAB — GLUCOSE, CAPILLARY
GLUCOSE-CAPILLARY: 115 mg/dL — AB (ref 65–99)
Glucose-Capillary: 132 mg/dL — ABNORMAL HIGH (ref 65–99)
Glucose-Capillary: 143 mg/dL — ABNORMAL HIGH (ref 65–99)
Glucose-Capillary: 126 mg/dL — ABNORMAL HIGH (ref 65–99)

## 2017-08-09 LAB — MRSA PCR SCREENING: MRSA BY PCR: NEGATIVE

## 2017-08-09 LAB — CBC
HCT: 31.4 % — ABNORMAL LOW (ref 39.0–52.0)
Hemoglobin: 10 g/dL — ABNORMAL LOW (ref 13.0–17.0)
MCH: 28.2 pg (ref 26.0–34.0)
MCHC: 31.8 g/dL (ref 30.0–36.0)
MCV: 88.5 fL (ref 78.0–100.0)
Platelets: 283 10*3/uL (ref 150–400)
RBC: 3.55 MIL/uL — ABNORMAL LOW (ref 4.22–5.81)
RDW: 17.5 % — ABNORMAL HIGH (ref 11.5–15.5)
WBC: 22.6 10*3/uL — ABNORMAL HIGH (ref 4.0–10.5)

## 2017-08-09 LAB — TSH: TSH: 7.545 u[IU]/mL — ABNORMAL HIGH (ref 0.350–4.500)

## 2017-08-09 LAB — BASIC METABOLIC PANEL
Anion gap: 18 — ABNORMAL HIGH (ref 5–15)
BUN: 35 mg/dL — ABNORMAL HIGH (ref 6–20)
CO2: 18 mmol/L — ABNORMAL LOW (ref 22–32)
Calcium: 8.5 mg/dL — ABNORMAL LOW (ref 8.9–10.3)
Chloride: 95 mmol/L — ABNORMAL LOW (ref 101–111)
Creatinine, Ser: 1.85 mg/dL — ABNORMAL HIGH (ref 0.61–1.24)
GFR calc Af Amer: 40 mL/min — ABNORMAL LOW (ref >60)
GFR calc non Af Amer: 34 mL/min — ABNORMAL LOW (ref >60)
Glucose, Bld: 164 mg/dL — ABNORMAL HIGH (ref 65–99)
Potassium: 5.2 mmol/L — ABNORMAL HIGH (ref 3.5–5.1)
Sodium: 131 mmol/L — ABNORMAL LOW (ref 135–145)

## 2017-08-09 LAB — BASIC METABOLIC PANEL WITH GFR
Anion gap: 15 (ref 5–15)
BUN: 37 mg/dL — ABNORMAL HIGH (ref 6–20)
CO2: 22 mmol/L (ref 22–32)
Calcium: 8.5 mg/dL — ABNORMAL LOW (ref 8.9–10.3)
Chloride: 96 mmol/L — ABNORMAL LOW (ref 101–111)
Creatinine, Ser: 1.87 mg/dL — ABNORMAL HIGH (ref 0.61–1.24)
GFR calc Af Amer: 39 mL/min — ABNORMAL LOW
GFR calc non Af Amer: 34 mL/min — ABNORMAL LOW
Glucose, Bld: 179 mg/dL — ABNORMAL HIGH (ref 65–99)
Potassium: 5.7 mmol/L — ABNORMAL HIGH (ref 3.5–5.1)
Sodium: 133 mmol/L — ABNORMAL LOW (ref 135–145)

## 2017-08-09 LAB — PHOSPHORUS: Phosphorus: 4.9 mg/dL — ABNORMAL HIGH (ref 2.5–4.6)

## 2017-08-09 LAB — SODIUM, URINE, RANDOM: Sodium, Ur: 17 mmol/L

## 2017-08-09 LAB — LACTIC ACID, PLASMA: LACTIC ACID, VENOUS: 4.7 mmol/L — AB (ref 0.5–1.9)

## 2017-08-09 LAB — I-STAT TROPONIN, ED: Troponin i, poc: 0.05 ng/mL (ref 0.00–0.08)

## 2017-08-09 LAB — CORTISOL: Cortisol, Plasma: 25.8 ug/dL

## 2017-08-09 LAB — MAGNESIUM: Magnesium: 1.4 mg/dL — ABNORMAL LOW (ref 1.7–2.4)

## 2017-08-09 LAB — I-STAT CG4 LACTIC ACID, ED: Lactic Acid, Venous: 9.2 mmol/L (ref 0.5–1.9)

## 2017-08-09 MED ORDER — SODIUM CHLORIDE 0.9 % IV BOLUS (SEPSIS)
500.0000 mL | Freq: Once | INTRAVENOUS | Status: AC
  Administered 2017-08-09: 500 mL via INTRAVENOUS

## 2017-08-09 MED ORDER — INSULIN ASPART 100 UNIT/ML ~~LOC~~ SOLN
2.0000 [IU] | Freq: Three times a day (TID) | SUBCUTANEOUS | Status: DC
  Administered 2017-08-09 (×3): 2 [IU] via SUBCUTANEOUS

## 2017-08-09 MED ORDER — MORPHINE SULFATE (PF) 4 MG/ML IV SOLN
1.0000 mg | INTRAVENOUS | Status: DC | PRN
  Administered 2017-08-09 (×2): 1 mg via INTRAVENOUS
  Administered 2017-08-10: 2 mg via INTRAVENOUS
  Filled 2017-08-09 (×2): qty 1

## 2017-08-09 MED ORDER — SODIUM BICARBONATE 8.4 % IV SOLN
INTRAVENOUS | Status: AC
  Administered 2017-08-09: 50 meq
  Filled 2017-08-09: qty 50

## 2017-08-09 MED ORDER — DOPAMINE-DEXTROSE 3.2-5 MG/ML-% IV SOLN
0.0000 ug/kg/min | INTRAVENOUS | Status: DC
  Administered 2017-08-09 (×3): 10 ug/kg/min via INTRAVENOUS
  Filled 2017-08-09: qty 250

## 2017-08-09 MED ORDER — ONDANSETRON HCL 4 MG/2ML IJ SOLN
4.0000 mg | Freq: Four times a day (QID) | INTRAMUSCULAR | Status: DC | PRN
  Administered 2017-08-09 (×2): 4 mg via INTRAVENOUS
  Filled 2017-08-09 (×2): qty 2

## 2017-08-09 MED ORDER — MAGNESIUM SULFATE IN D5W 1-5 GM/100ML-% IV SOLN
1.0000 g | Freq: Once | INTRAVENOUS | Status: DC
  Filled 2017-08-09: qty 100

## 2017-08-09 MED ORDER — PREDNISONE 20 MG PO TABS
20.0000 mg | ORAL_TABLET | Freq: Every day | ORAL | Status: DC
  Administered 2017-08-10: 20 mg via ORAL
  Filled 2017-08-09: qty 1

## 2017-08-09 MED ORDER — LACTATED RINGERS IV BOLUS (SEPSIS)
500.0000 mL | Freq: Once | INTRAVENOUS | Status: AC
  Administered 2017-08-09: 500 mL via INTRAVENOUS

## 2017-08-09 MED ORDER — MAGNESIUM SULFATE 2 GM/50ML IV SOLN
INTRAVENOUS | Status: AC
  Administered 2017-08-09: 2 g
  Filled 2017-08-09: qty 50

## 2017-08-09 MED ORDER — ATROPINE SULFATE 1 MG/10ML IJ SOSY
PREFILLED_SYRINGE | INTRAMUSCULAR | Status: AC
  Administered 2017-08-09: 1 mg
  Filled 2017-08-09: qty 10

## 2017-08-09 MED ORDER — SODIUM CHLORIDE 0.9 % IV SOLN
500.0000 mg | INTRAVENOUS | Status: DC
  Administered 2017-08-09: 500 mg via INTRAVENOUS
  Filled 2017-08-09: qty 500

## 2017-08-09 MED ORDER — SODIUM CHLORIDE 0.9 % IV SOLN: 250.0000 mL | INTRAVENOUS | Status: DC | PRN

## 2017-08-09 MED ORDER — DOPAMINE-DEXTROSE 3.2-5 MG/ML-% IV SOLN
INTRAVENOUS | Status: AC
  Filled 2017-08-09: qty 250

## 2017-08-09 MED ORDER — IPRATROPIUM-ALBUTEROL 0.5-2.5 (3) MG/3ML IN SOLN
3.0000 mL | Freq: Four times a day (QID) | RESPIRATORY_TRACT | Status: DC
  Administered 2017-08-09 – 2017-08-10 (×6): 3 mL via RESPIRATORY_TRACT
  Filled 2017-08-09 (×6): qty 3

## 2017-08-09 MED ORDER — SODIUM CHLORIDE 0.9 % IV SOLN
4.0000 | Freq: Once | INTRAVENOUS | Status: AC
  Administered 2017-08-09: 4 via INTRAVENOUS
  Filled 2017-08-09: qty 160

## 2017-08-09 MED ORDER — MORPHINE SULFATE (PF) 4 MG/ML IV SOLN
INTRAVENOUS | Status: AC
  Filled 2017-08-09: qty 1

## 2017-08-09 MED ORDER — DEXTROSE 5 % IV SOLN
1.0000 g | INTRAVENOUS | Status: DC
  Administered 2017-08-09 – 2017-08-10 (×2): 1 g via INTRAVENOUS
  Filled 2017-08-09 (×3): qty 1

## 2017-08-09 MED ORDER — HEPARIN SODIUM (PORCINE) 5000 UNIT/ML IJ SOLN
5000.0000 [IU] | Freq: Three times a day (TID) | INTRAMUSCULAR | Status: DC
  Administered 2017-08-09 (×3): 5000 [IU] via SUBCUTANEOUS
  Filled 2017-08-09 (×3): qty 1

## 2017-08-09 MED ORDER — DOPAMINE-DEXTROSE 3.2-5 MG/ML-% IV SOLN
0.0000 ug/kg/min | INTRAVENOUS | Status: DC
  Administered 2017-08-09: 10 ug/kg/min via INTRAVENOUS

## 2017-08-09 NOTE — Progress Notes (Signed)
Pt removed bipap himself this AM. NRB mask placed on pt at this time. Sat 100%

## 2017-08-09 NOTE — Progress Notes (Signed)
Patient began to block down in HR 20's becoming asystole, unresponsive, eyes rolling back, pale, Dr. Rolla Etienne camered in, fluids given, 1 amp of atropine, IV Dopamine at 10 mcg hung and at 0620 1 amp of Bicaarb given, contacting respiratory for bipap, 2 gm of IV Magnesium given, MD aware of DNR status, all meds given from orders of MD, family called to come to the hospital.

## 2017-08-09 NOTE — ED Notes (Signed)
RT at bedside for neb tx

## 2017-08-09 NOTE — ED Notes (Signed)
Digifab completed, no s/s of adverse reaction noted. Will continue to monitor

## 2017-08-09 NOTE — H&P (Signed)
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 Patient was trying to get into his truck but felt weak and fell by the side and scraped his legs against the car. Denies LOC. Son called EMS because patient was not able to get back up. Denies chest pain or shortness of breath during this time. Found to be bradycardic by EMS with HR of 10?Marland Kitchen In ED patient was bradycardic on tele and had one episode of possible PEA with HR of 10 which lasted few seconds. Pacer pads placed and cardio consulted.   On further questioning patient states he had decreased PO intake for the past few days. Has chronic cough that is non productive. Denies any recent fever, travel, sick contact. Has occasional nausea but denies vomiting. + soft/ loose stool for the past 3 days. Denies melena, dysuria, abdominal pain.   Digoxin level elevtaed on workup in the ER and K of 5.8. Given digibind along with calcium gluconate, insulin d50, albuterol. Patient currently lying in bed in NAD and answering all questions approprietly.   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  PAST SURGICAL HISTORY: He  has a past surgical history that includes Vasectomy (1973) and Acne cyst removal (1968).  Allergies  Allergen Reactions  . Lovastatin Rash    No current facility-administered medications on file prior to encounter.    Current Outpatient Prescriptions on File Prior to Encounter  Medication Sig  .  acetaminophen (TYLENOL) 500 MG tablet Take 1,000 mg by mouth every 6 (six) hours as needed for mild pain.  . Amino Acids-Protein Hydrolys (FEEDING SUPPLEMENT, PRO-STAT SUGAR FREE 64,) LIQD Take 30 mLs by mouth 2 (two) times daily.  Marland Kitchen apixaban (ELIQUIS) 5 MG TABS tablet Take 1 tablet (5 mg total) by mouth 2 (two) times daily.  . bumetanide (BUMEX) 2 MG tablet Take 1 tablet (2 mg total) by mouth 2 (two) times daily.  . Calcium Carbonate-Vit D-Min (CALCIUM 600+D PLUS MINERALS PO) Take 1 tablet by mouth 2 (two) times daily.  Marland Kitchen dextromethorphan-guaiFENesin (MUCINEX DM) 30-600 MG 12hr tablet Take 1 tablet by mouth 2 (two) times daily.   . digoxin (LANOXIN) 0.25 MG tablet Take 1 tablet (0.25 mg total) by mouth daily.  Marland Kitchen diltiazem (CARDIZEM CD) 360 MG 24 hr capsule Take 1 capsule (360 mg total) by mouth daily.  . famotidine (PEPCID) 20 MG tablet Take 1 tablet (20 mg total) by mouth at bedtime.  . insulin glargine (LANTUS) 100 UNIT/ML injection Inject 0.08 mLs (8 Units total) into the skin daily.  Marland Kitchen loratadine (CLARITIN) 10 MG tablet Take 10 mg by mouth daily.  . magnesium oxide (MAG-OX) 400 (241.3 Mg) MG tablet Take 1 tablet (400 mg total) by mouth daily. ** REFILLS ON HOLD WHILE PT IS IN SNF 10/4 **  . metFORMIN (GLUCOPHAGE XR) 500 MG 24 hr tablet Take 2 tabs with breakfast and 2 tabs with supper (Patient taking differently: Take 1,000 mg by mouth 2 (two) times daily. )  . pantoprazole (  PROTONIX) 40 MG tablet TAKE 1 TABLET BY MOUTH EVERY DAY *TAKE 30-60 MINUTES BEFORE FIRST MEAL OF THE DAY*  . potassium chloride SA (K-DUR,KLOR-CON) 20 MEQ tablet Take 1 tablet (20 mEq total) by mouth daily.  . predniSONE (DELTASONE) 20 MG tablet Take 1 tablet (20 mg total) by mouth daily with breakfast.  . tamsulosin (FLOMAX) 0.4 MG CAPS capsule Take 0.4 mg by mouth daily after supper.  . terazosin (HYTRIN) 2 MG capsule Take 2 mg by mouth at bedtime.  . OXYGEN Inhale 4-8 L into the lungs See admin instructions. 4 liters  CONTINUOUSLY and to titrate up to 8 liters CONTINUOUSLY (with exertion) "to keep sats above 90" ([facility])    FAMILY HISTORY:  His indicated that the status of his mother is unknown. He indicated that the status of his father is unknown.    SOCIAL HISTORY: He  reports that he quit smoking about 21 years ago. His smoking use included Cigarettes. He has a 55.50 pack-year smoking history. He has never used smokeless tobacco. He reports that he drinks about 1.8 oz of alcohol per week . He reports that he does not use drugs.  REVIEW OF SYSTEMS:   As per HPI  VITAL SIGNS: BP 105/72   Pulse 62   Temp 98.6 F (37 C) (Oral)   Resp 19   Ht _0  (1.6 m)   Wt 105 lb (47.6 kg)   SpO2 99%   BMI 18.60 kg/m   HEMODYNAMICS:    INTAKE / OUTPUT: No intake/output data recorded.  PHYSICAL EXAMINATION: General:  Elderly male appropriate for age lying in bed in NAD under bair hugger. Answering all questions appropriately.  Neuro:  EOMI, PERRLA no focal deficits seen HEENT:  Atraumatic  tongue midline  Cardiovascular:  Sinus bradycardia  Lungs:  Fair air entry b/l  +crackles b/l No wheezing  Abdomen:  Soft nontender +bS Musculoskeletal:  +pulses in all 4 ext. No lower ext edema 5/5 muscle strength in all 4 ext   Skin:   New shin wounds from fall  LABS:  BMET  Recent Labs Lab 08/06/17 0952 08/01/2017 2246 08/05/2017 2302  NA 138 129* 131*  K 3.7 5.8* 5.9*  CL 94* 95* 96*  CO2 29 16*  --   BUN 28* 35* 34*  CREATININE 0.99 1.83* 1.60*  GLUCOSE 148* 204* 209*    Electrolytes  Recent Labs Lab 08/06/17 0952 07/23/2017 2246  CALCIUM 9.4 8.4*    CBC  Recent Labs Lab 08/06/17 0952 07/24/2017 2246 07/31/2017 2302  WBC 14.6* 15.8*  --   HGB 10.2* 10.7* 11.2*  HCT 31.3* 34.6* 33.0*  PLT 299.0 297  --     Coag's  Recent Labs Lab 08/06/2017 2246  INR 1.71    Sepsis Markers  Recent Labs Lab 07/30/2017 2303  LATICACIDVEN 11.58*    ABG No results for input(s): PHART,  PCO2ART, PO2ART in the last 168 hours.  Liver Enzymes  Recent Labs Lab 07/31/2017 2246  AST 56*  ALT 22  ALKPHOS 75  BILITOT 0.9  ALBUMIN 3.2*    Cardiac Enzymes  Recent Labs Lab 08/06/17 0952  PROBNP 1,885.0*    Glucose No results for input(s): GLUCAP in the last 168 hours.  Imaging Dg Chest Portable 1 View  Result Date: 08/03/2017 CLINICAL DATA:  Acute onset of shortness of breath and dizziness. Briefly pulseless. Initial encounter. EXAM: PORTABLE CHEST 1 VIEW COMPARISON:  Chest radiograph performed 07/07/2017 FINDINGS: The lungs are well-aerated. Chronic bibasilar airspace opacities  likely reflect the patient's chronic interstitial lung disease. No definite superimposed airspace consolidation is seen. There is no evidence of pleural effusion or pneumothorax. The cardiomediastinal silhouette is mildly enlarged. External pacing pads are noted. No acute osseous abnormalities are seen. IMPRESSION: 1. Chronic bibasilar airspace opacities likely reflect the patient's chronic interstitial lung disease. No definite superimposed airspace consolidation seen. 2. Mild cardiomegaly. Electronically Signed   By: Garald Balding M.D.   On: 07/21/2017 23:41     STUDIES:  ECHO: Renal US:   CULTURES: Blood cultures (10/22):   ANTIBIOTICS: Vancomycin (10/22) Cefepime (10/22)   LINES/TUBES: PIV B/L hand  DISCUSSION: 71 year old male with pulmonary HTN, ILD, on home 02, aflutter presented for weakness and found to be bradycardic.  ASSESSMENT / PLAN:  PULMONARY A: Severe Pulmonary HTN (PA pressure 4m Hg) secondary to ILD on 4L [place]  P:   Chest Xray consistent with pulmonary fibrosis.  Low clinical suspicion for PNA On chronic steroids 20 mg prednisone daily. Continue F/U CT chest for further evaluation Standing Duoneb  CARDIOVASCULAR A:  Severe symptomatic bradycardia (Junctional bradycardia) secondary to digoxin toxicity and hyperkalemia H/O Diastolic CHF LV EF 509-38%and  severely dilated RV Elevated BNP P:  Tele Maintain on pacerpads. Currently maintaining HR>60 Cardio consulted digibind given for elevated digoxin level in the setting of renal failure and hyperkalemia  F/u repeat AM BMP F/U ECHO Patient is maintaining MAP>65. No clinical evidence at this time of septic or cardiogenic shock.  Given 500 cc of NS in the ED. Will administer another 500 CC of LR and monitor.  If patient has another episode of bradycardia will start dopamine    RENAL A:  Acute Renal failure secondary to pre-renal (most likely) due to poor PO intake vs renal Hyperkalemia in the setting of AKI Hyponatremia secondary to hypovolemic hyponatremia  Anion Gap metabolic acidosis secondary to lactic acidosis  P:   Given 500 cc of IVF in the ED. Will give another 500 cc and monitor respiratory status and I/O Repeat Cr mildly improved.  F/u renal [place] urine NA Given Ca gluconate, insulin, D50, albuterol for hyperkalemia. Kayexalate held for now due to having diarrhea at home.  F/U repeat BMP and correct electrolytes  F/U repeat lactic acid    GASTROINTESTINAL A:  Diarrhea r/o infectious etiology  P:   F/u CT abdomen and pelvis  NPO  HEMATOLOGIC A:  Leukocytosis r/o sepsis   P:  Monitor  F/u AM CBC  HG and platelets stable  INFECTIOUS A:  Severe lactic acidosis r/o infectious source Leukocytosis  P:   Continue broad spectrum abx F/u blood cultures F/u Ct chest/ abdomen/pelvis  ENDOCRINE A:  No acute event  P:   F/u tsh and cortisol level ISS  NEUROLOGIC A:  Syncope secondary to severe bradycardia P:   Monitor  Neuro check     FAMILY :   Code status discussed with the patient at bedside. He does not want chest compression and " would like to go if his heart stops". He would like to be intubated if pulmonary issue is possibly reversible. Would not want to be intubated for prolonged period of time and does not want trach. He was made aware that due to  his advanced lung disease that once intubated he may never be extubated.     TBelle MeadPager: [phone] 08/09/2017, 1:45 AM

## 2017-08-09 NOTE — Progress Notes (Signed)
Pharmacy Antibiotic Note  Gilbert Dominguez is a 71 y.o. male admitted on 07/21/2017 with possible sepsis from GI source.  Pharmacy has been consulted for vancomycin and zosyn dosing. -WBC= 22.6, afebrile, LA 11>>9 -SCr= 1.85 (baseline ~ 0.7)  Plan: -Vancomycin 516m IV q24hr -Cefepime 1gm IV q24h -Will follow renal function, cultures and clinical progress   Height: 5' 3" (160 cm) Weight: 115 lb 4.8 oz (52.3 kg) IBW/kg (Calculated) : 56.9  Temp (24hrs), Avg:97.9 F (36.6 C), Min:97.3 F (36.3 C), Max:98.6 F (37 C)   Recent Labs Lab 08/06/17 0952 08/11/2017 2246 08/09/2017 2302 07/25/2017 2303 08/09/17 0223 08/09/17 0310  WBC 14.6* 15.8*  --   --   --  22.6*  CREATININE 0.99 1.83* 1.60*  --   --  1.85*  LATICACIDVEN  --   --   --  11.58* 9.20*  --     Estimated Creatinine Clearance: 26.3 mL/min (A) (by C-G formula based on SCr of 1.85 mg/dL (H)).    Allergies  Allergen Reactions  . Lovastatin Rash    Antimicrobials this admission: 10/22 vanc 10/22 cefepime  Dose adjustments this admission:   Microbiology results: 10/21 blood x2 10/22 MRSA PCR- neg  Thank you for allowing pharmacy to be a part of this patient's care.  AHildred Laser Pharm D 08/09/2017 10:58 AM

## 2017-08-09 NOTE — Progress Notes (Signed)
PHARMACY NOTE:  ANTIMICROBIAL RENAL DOSAGE ADJUSTMENT  Current antimicrobial regimen includes a mismatch between antimicrobial dosage and estimated renal function.  As per policy approved by the Pharmacy & Therapeutics and Medical Executive Committees, the antimicrobial dosage will be adjusted accordingly.  Current antimicrobial dosage:  Vancomycin 1g Q24H and cefepime 1g Q12H.  Indication: Sepsis  Renal Function:  Estimated Creatinine Clearance: 27.7 mL/min (A) (by C-G formula based on SCr of 1.6 mg/dL (H)). _0      On intermittent HD, scheduled: _1      On CRRT    Antimicrobial dosage has been changed to:  Vancomycin 564m Q24H and cefepime 1g Q24H.    Thank you for allowing pharmacy to be a part of this patient's care.  VWynona Neat PharmD, BCPS  08/09/2017 1:54 AM

## 2017-08-09 NOTE — Progress Notes (Signed)
54m of IV morphine wasted in sharps container with B.Bass, RN as a witness.

## 2017-08-09 NOTE — ED Notes (Signed)
Attempted to call report, RN to return call

## 2017-08-09 NOTE — Progress Notes (Addendum)
[facility] Progress Note Patient Name: Gilbert Dominguez DOB: 1946/04/02 MRN: [id number]   Date of Service  08/09/2017  HPI/Events of Note  Patient became bradycardic in the 20s with periods of pauses and hypotension. Code status is DNR  eICU Interventions  Given atropine, bicarb Started on dopamine drip with improvement in heart rate and blood pressure. Family informed they on the way in.     Intervention Category Major Interventions: Arrhythmia - evaluation and management  Shavanna Furnari 08/09/2017, 6:21 AM

## 2017-08-09 NOTE — Progress Notes (Signed)
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 Patient was trying to get into his truck but felt weak and fell by the side and scraped his legs against the car. Denies LOC. Son called EMS because patient was not able to get back up. Denies chest pain or shortness of breath during this time. Found to be bradycardic by EMS with HR of 10?Marland Kitchen In ED patient was bradycardic on tele and had one episode of possible PEA with HR of 10 which lasted few seconds. Pacer pads placed and cardio consulted.   On further questioning patient states he had decreased PO intake for the past few days. Has chronic cough that is non productive. Denies any recent fever, travel, sick contact. Has occasional nausea but denies vomiting. + soft/ loose stool for the past 3 days. Denies melena, dysuria, abdominal pain.   Digoxin level elevtaed on workup in the ER and K of 5.8. Given digibind along with calcium gluconate, insulin d50, albuterol. Patient currently lying in bed in NAD and answering all questions approprietly.    Subjective  Had another symptomatic bradycardic event this morning around 3:73 AM. Almost certainly related to hyperkalemia. He has since removed his BiPAP. He is awake, cooperative and in no distress  VITAL SIGNS: BP (!) 87/59 (BP Location: Left Arm)   Pulse 73   Temp (!) 97.3 F (36.3 C) (Axillary)   Resp 15   Ht _0  (1.6 m)   Wt 115 lb 4.8 oz (52.3 kg)   SpO2 94%   BMI 20.42 kg/m   HEMODYNAMICS:    INTAKE / OUTPUT: I/O last 3 completed shifts: In: 38 [P.O.:120; I.V.:30; IV Piggyback:500] Out: 109 [Urine:50]  PHYSICAL EXAMINATION: General:  Elderly  white male, restless, appropriate, not in distress. Neuro:  Awake, oriented, no focal neurological deficits.  HEENT:  Atraumatic, normocephalic, no JVD, mucous membranes are dry  Cardiovascular:  Remains bradycardic, loud systolic murmur Lungs:  Equal air entry, mild accessory muscle use, diminished bilaterally  Abdomen:  Soft, nontender, no  organomegaly  Musculoskeletal:  Equal strength and bulk  Skin:   He has new shin wounds from his fall, he has bilateral lower extremity pitting edema  LABS:  BMET  Recent Labs Lab 08/06/17 0952 07/20/2017 2246 08/14/2017 2302 08/09/17 0310  NA 138 129* 131* 131*  K 3.7 5.8* 5.9* 5.2*  CL 94* 95* 96* 95*  CO2 29 16*  --  18*  BUN 28* 35* 34* 35*  CREATININE 0.99 1.83* 1.60* 1.85*  GLUCOSE 148* 204* 209* 164*    Electrolytes  Recent Labs Lab 08/06/17 0952 07/22/2017 2246 08/09/17 0310  CALCIUM 9.4 8.4* 8.5*  MG  --   --  1.4*  PHOS  --   --  4.9*    CBC  Recent Labs Lab 08/06/17 0952 07/23/2017 2246 07/27/2017 2302 08/09/17 0310  WBC 14.6* 15.8*  --  22.6*  HGB 10.2* 10.7* 11.2* 10.0*  HCT 31.3* 34.6* 33.0* 31.4*  PLT 299.0 297  --  283    Coag's  Recent Labs Lab 08/05/2017 2246  INR 1.71    Sepsis Markers  Recent Labs Lab  07/31/2017 2303 08/09/17 0223  LATICACIDVEN 11.58* 9.20*    ABG No results for input(s): PHART, PCO2ART, PO2ART in the last 168 hours.  Liver Enzymes  Recent Labs Lab 08/04/2017 2246  AST 56*  ALT 22  ALKPHOS 75  BILITOT 0.9  ALBUMIN 3.2*    Cardiac Enzymes  Recent Labs Lab 08/06/17 0952  PROBNP 1,885.0*    Glucose  Recent Labs Lab 08/09/17 0352  GLUCAP 115*    Imaging Ct Abdomen Pelvis Wo Contrast  Result Date: 08/09/2017 CLINICAL DATA:  Acute onset of shortness of breath and dizziness. Slipped to ground. Concern for chest or abdominal injury. EXAM: CT CHEST, ABDOMEN AND PELVIS WITHOUT CONTRAST TECHNIQUE: Multidetector CT imaging of the chest, abdomen and pelvis was  performed following the standard protocol without IV contrast. COMPARISON:  Renal ultrasound performed 06/11/2017, and CTA of the chest performed 04/29/2017 FINDINGS: CT CHEST FINDINGS Cardiovascular: The heart is enlarged. Calcification is noted at the aortic and mitral valves. Scattered calcification is noted along the aortic arch and proximal great vessels. There is no evidence of aortic injury, though evaluation for aortic injury is limited without contrast. Mediastinum/Nodes: A prominent 1.7 cm right paratracheal node is seen. No additional mediastinal lymphadenopathy is appreciated. No pericardial effusion is identified. The thyroid gland is grossly unremarkable. No axillary lymphadenopathy is appreciated. Lungs/Pleura: Bilateral peripheral honeycombing is noted, with prominent areas of scarring. Emphysematous change is seen bilaterally. No definite superimposed focal airspace consolidation is appreciated. Evaluation for mass is limited due to areas of scarring. Musculoskeletal: No acute osseous abnormalities are identified. The visualized musculature is unremarkable in appearance. CT ABDOMEN PELVIS FINDINGS Hepatobiliary: The liver is grossly unremarkable in appearance. Trace ascites is noted about the liver. There is wall thickening at the gallbladder, with surrounding soft tissue inflammation, raising question for cholecystitis. Vague soft tissue inflammation tracks about the porta hepatis. The common bile duct is not well characterized. Pancreas: The pancreas is within normal limits. Spleen: The spleen is unremarkable in appearance. Trace ascites is noted about the spleen. Adrenals/Urinary Tract: The adrenal glands are grossly unremarkable in appearance Fluid is seen tracking inferiorly along Gerota's fascia bilaterally. Mild perinephric stranding is noted bilaterally. There is no evidence of hydronephrosis. No renal or ureteral stones are identified. Stomach/Bowel: The stomach is unremarkable in  appearance. Vague soft tissue inflammation is noted tracking along the second and third segments of the duodenum, with associated wall thickening. This inflammation tracks inferiorly along the mesentery. This may reflect an underlying duodenal ulcer or duodenitis. The appendix is normal in caliber, without evidence of appendicitis. Scattered diverticulosis is noted along the descending colon, without evidence of diverticulitis. Vascular/Lymphatic: Scattered calcification is seen along the abdominal aorta and its branches. The abdominal aorta is otherwise grossly unremarkable. The inferior vena cava is grossly unremarkable. No retroperitoneal lymphadenopathy is seen. No pelvic sidewall lymphadenopathy is identified. Reproductive: The bladder is mildly distended and grossly unremarkable. The prostate remains normal in size, with mild calcification. Other: No additional soft tissue abnormalities are seen. Musculoskeletal: No acute osseous abnormalities are identified. A chronic right-sided pars defect is noted at L5, and a lamina defect is noted on the left side at L5. The visualized musculature is unremarkable in appearance. IMPRESSION: 1. Vague soft tissue inflammation tracking along the second and third segments of the duodenum, with associated wall thickening. Inflammation tracks inferiorly along the mesentery. This may reflect an underlying duodenal ulcer or duodenitis. Would correlate with the patient's symptoms, and consider endoscopy for further evaluation. 2. Associated wall  thickening at the gallbladder, with surrounding soft tissue inflammation, raising question for cholecystitis. Vague soft tissue inflammation tracks about the porta hepatis. 3. Associated fluid tracks inferiorly along Gerota's fascia bilaterally. Trace ascites noted about the upper abdomen. 4. Bilateral peripheral honeycombing, with prominent areas of scarring in both lungs. Bilateral emphysema. No definite superimposed focal airspace  consolidation seen. 5. Scattered diverticulosis along the descending colon, without evidence of diverticulitis. 6. Prominent 1.7 cm right paratracheal node. 7. Cardiomegaly.  Calcification at the aortic and mitral valves. 8. Scattered aortic atherosclerosis. Electronically Signed   By: Garald Balding M.D.   On: 08/09/2017 03:12   Ct Chest Wo Contrast  Result Date: 08/09/2017 CLINICAL DATA:  Acute onset of shortness of breath and dizziness. Slipped to ground. Concern for chest or abdominal injury. EXAM: CT CHEST, ABDOMEN AND PELVIS WITHOUT CONTRAST TECHNIQUE: Multidetector CT imaging of the chest, abdomen and pelvis was performed following the standard protocol without IV contrast. COMPARISON:  Renal ultrasound performed 06/11/2017, and CTA of the chest performed 04/29/2017 FINDINGS: CT CHEST FINDINGS Cardiovascular: The heart is enlarged. Calcification is noted at the aortic and mitral valves. Scattered calcification is noted along the aortic arch and proximal great vessels. There is no evidence of aortic injury, though evaluation for aortic injury is limited without contrast. Mediastinum/Nodes: A prominent 1.7 cm right paratracheal node is seen. No additional mediastinal lymphadenopathy is appreciated. No pericardial effusion is identified. The thyroid gland is grossly unremarkable. No axillary lymphadenopathy is appreciated. Lungs/Pleura: Bilateral peripheral honeycombing is noted, with prominent areas of scarring. Emphysematous change is seen bilaterally. No definite superimposed focal airspace consolidation is appreciated. Evaluation for mass is limited due to areas of scarring. Musculoskeletal: No acute osseous abnormalities are identified. The visualized musculature is unremarkable in appearance. CT ABDOMEN PELVIS FINDINGS Hepatobiliary: The liver is grossly unremarkable in appearance. Trace ascites is noted about the liver. There is wall thickening at the gallbladder, with surrounding soft tissue  inflammation, raising question for cholecystitis. Vague soft tissue inflammation tracks about the porta hepatis. The common bile duct is not well characterized. Pancreas: The pancreas is within normal limits. Spleen: The spleen is unremarkable in appearance. Trace ascites is noted about the spleen. Adrenals/Urinary Tract: The adrenal glands are grossly unremarkable in appearance Fluid is seen tracking inferiorly along Gerota's fascia bilaterally. Mild perinephric stranding is noted bilaterally. There is no evidence of hydronephrosis. No renal or ureteral stones are identified. Stomach/Bowel: The stomach is unremarkable in appearance. Vague soft tissue inflammation is noted tracking along the second and third segments of the duodenum, with associated wall thickening. This inflammation tracks inferiorly along the mesentery. This may reflect an underlying duodenal ulcer or duodenitis. The appendix is normal in caliber, without evidence of appendicitis. Scattered diverticulosis is noted along the descending colon, without evidence of diverticulitis. Vascular/Lymphatic: Scattered calcification is seen along the abdominal aorta and its branches. The abdominal aorta is otherwise grossly unremarkable. The inferior vena cava is grossly unremarkable. No retroperitoneal lymphadenopathy is seen. No pelvic sidewall lymphadenopathy is identified. Reproductive: The bladder is mildly distended and grossly unremarkable. The prostate remains normal in size, with mild calcification. Other: No additional soft tissue abnormalities are seen. Musculoskeletal: No acute osseous abnormalities are identified. A chronic right-sided pars defect is noted at L5, and a lamina defect is noted on the left side at L5. The visualized musculature is unremarkable in appearance. IMPRESSION: 1. Vague soft tissue inflammation tracking along the second and third segments of the duodenum, with associated wall thickening. Inflammation tracks inferiorly  along  the mesentery. This may reflect an underlying duodenal ulcer or duodenitis. Would correlate with the patient's symptoms, and consider endoscopy for further evaluation. 2. Associated wall thickening at the gallbladder, with surrounding soft tissue inflammation, raising question for cholecystitis. Vague soft tissue inflammation tracks about the porta hepatis. 3. Associated fluid tracks inferiorly along Gerota's fascia bilaterally. Trace ascites noted about the upper abdomen. 4. Bilateral peripheral honeycombing, with prominent areas of scarring in both lungs. Bilateral emphysema. No definite superimposed focal airspace consolidation seen. 5. Scattered diverticulosis along the descending colon, without evidence of diverticulitis. 6. Prominent 1.7 cm right paratracheal node. 7. Cardiomegaly.  Calcification at the aortic and mitral valves. 8. Scattered aortic atherosclerosis. Electronically Signed   By: Garald Balding M.D.   On: 08/09/2017 03:12   Dg Chest Portable 1 View  Result Date: 08/14/2017 CLINICAL DATA:  Acute onset of shortness of breath and dizziness. Briefly pulseless. Initial encounter. EXAM: PORTABLE CHEST 1 VIEW COMPARISON:  Chest radiograph performed 07/07/2017 FINDINGS: The lungs are well-aerated. Chronic bibasilar airspace opacities likely reflect the patient's chronic interstitial lung disease. No definite superimposed airspace consolidation is seen. There is no evidence of pleural effusion or pneumothorax. The cardiomediastinal silhouette is mildly enlarged. External pacing pads are noted. No acute osseous abnormalities are seen. IMPRESSION: 1. Chronic bibasilar airspace opacities likely reflect the patient's chronic interstitial lung disease. No definite superimposed airspace consolidation seen. 2. Mild cardiomegaly. Electronically Signed   By: Garald Balding M.D.   On: 07/29/2017 23:41     STUDIES:  ECHO: Renal US:   CULTURES: Blood cultures (10/22):   ANTIBIOTICS: Vancomycin  (10/22) Cefepime (10/22)   LINES/TUBES: PIV B/L hand  DISCUSSION: 71 year old male with pulmonary HTN, ILD, on home 02, a-flutter presented for weakness and found to be bradycardic. Suspect etiology primarily due to dehydration, renal failure, and transient gut ischemia. We're awaiting abdominal ultrasound and echocardiogram. We will repeat chemistry and continue supportive care. He is very frail, prognosis is quite poor, he is DO NOT RESUSCITATE.   ASSESSMENT / PLAN:  PULMONARY A: Severe Pulmonary HTN (PA pressure 54m Hg) secondary to ILD on 4L [place] Chronic hypoxic respiratory failure CT chest: Peripheral honeycombing, bilateral scarring, bilateral emphysema, no clear focal acute airspace disease -Respiratory status labored P:   Continue daily prednisone Supplemental oxygen Noninvasive positive pressure ventilation as needed Continuous pulse oximetry  Continue DuoNeb  CARDIOVASCULAR A:   Severe symptomatic bradycardia (Junctional bradycardia) secondary to digoxin toxicity and hyperkalemia H/O Diastolic CHF LV EF 520-94%and severely dilated RV Elevated BNP Had another repeat episode of symptomatic bradycardia early this a.m. P:  Continue telemetry monitoring Repeat blood chemistry Treatment hyperkalemia if present Follow-up echocardiogram Discontinue digoxin Mean arterial pressure greater than 65  RENAL A:  Acute Renal failure secondary to pre-renal (most likely) due to poor PO intake vs renal Hyperkalemia in the setting of AKI Hyponatremia secondary to hypovolemic hyponatremia  Anion Gap metabolic acidosis secondary to lactic acidosis  Hypomagnesemia -Serum creatinines climbed, most recent potassium remained elevated, but not seemingly critical, acid base somewhat improved. This man is not a dialysis candidate P:   Repeat lactic acid Repeat chemistry Follow-up renal ultrasound Consider bicarbonate infusion Repeat magnesium level in a.m.  GASTROINTESTINAL A:    Diarrhea r/o infectious etiology  Rule out cholecystitis Rule out ischemic colitis  CT abdomen pelvis: Soft tissue inflammation along the second and third segment of the duodenum. Associated wall thickening. Wall thickening of the gallbladder with soft tissue inflammation soft tissue  inflammation tracks about the porta hepatis. P:   Repeat LFTs Follow-up abdominal ultrasound Nothing by mouth for now, likely transition to clears  HEMATOLOGIC A:   Leukocytosis r/o sepsis  Anemia of chronic disease P:  Trend CBC Follow-up white blood cell count   INFECTIOUS A:   Severe lactic acidosis r/o infectious source Leukocytosis; favor transient gut ischemia as etiology P:   Day #1 vancomycin and cefepime Follow-up cultures  ENDOCRINE A:   Steroid dependence P:   Trend glucose Continue prednisone  NEUROLOGIC A:  Syncope secondary to severe bradycardia P:   Supportive care  My critical care time 40 minutes Erick Colace ACNP-BC [place] Pager # [phone] OR # [phone] if no answer  Attending Note:  71 year old male with   08/09/2017, 8:32 AM  Attending Note:  71 year old male with chronic respiratory failure due to pulmonary HTN and fibrosis presenting with dig toxicity.  On exam, diffuse crackles noted.  I reviewed CXR myself, fibrosis noted.  Discussed with PCCM-NP.  Will continue prednisone for now.  Continue vancomycin and cefepime.  F/U on cultures.  Continue tele monitoring.  Continue NIV.  Confirmed DNR status, no intubation.  Hold in the ICU.  The patient is critically ill with multiple organ systems failure and requires high complexity decision making for assessment and support, frequent evaluation and titration of therapies, application of advanced monitoring technologies and extensive interpretation of multiple databases.   Critical Care Time devoted to patient care services described in this note is  35  Minutes. This time reflects time  of care of this signee Dr Jennet Maduro. This critical care time does not reflect procedure time, or teaching time or supervisory time of PA/NP/Med student/Med Resident etc but could involve care discussion time.  Rush Farmer, M.D. [facility] Pulmonary/Critical Care Medicine. Pager: [phone]. After hours pager: [phone].

## 2017-08-09 NOTE — Progress Notes (Signed)
Pt is on HFNC at this time tolerating it well.

## 2017-08-09 NOTE — Progress Notes (Signed)
Pt. Mag. 1.4 and Na+ 131.  Reported to MD.  Pt. Blood pressure is dropping with systolics  In the 60A.  Pt. Is asymptomatic and is eating ice.  MD aware and am now awaiting orders. Will continue to monitor closely.

## 2017-08-09 NOTE — Progress Notes (Signed)
  Echocardiogram 2D Echocardiogram has been performed.  Bobbye Charleston 08/09/2017, 11:58 AM

## 2017-08-09 NOTE — Progress Notes (Signed)
eLink Physician-Brief Progress Note Patient Name: Gilbert Dominguez DOB: [birth year] MRN: [id number]   Date of Service  08/09/2017  HPI/Events of Note  End stage IPF, DNR, wants 'something to relax '  eICU Interventions  Trial of Low dose morphine     Intervention Category Intermediate Interventions: Respiratory distress - evaluation and management  ALVA,RAKESH V. 08/09/2017, 10:43 PM

## 2017-08-10 DIAGNOSIS — N179 Acute kidney failure, unspecified: Principal | ICD-10-CM

## 2017-08-10 DIAGNOSIS — J849 Interstitial pulmonary disease, unspecified: Secondary | ICD-10-CM

## 2017-08-10 DIAGNOSIS — I5032 Chronic diastolic (congestive) heart failure: Secondary | ICD-10-CM

## 2017-08-10 DIAGNOSIS — I483 Typical atrial flutter: Secondary | ICD-10-CM

## 2017-08-10 DIAGNOSIS — I2721 Secondary pulmonary arterial hypertension: Secondary | ICD-10-CM

## 2017-08-10 DIAGNOSIS — E871 Hypo-osmolality and hyponatremia: Secondary | ICD-10-CM

## 2017-08-10 DIAGNOSIS — N183 Chronic kidney disease, stage 3 (moderate): Secondary | ICD-10-CM

## 2017-08-10 DIAGNOSIS — E875 Hyperkalemia: Secondary | ICD-10-CM

## 2017-08-10 DIAGNOSIS — R579 Shock, unspecified: Secondary | ICD-10-CM

## 2017-08-10 LAB — BASIC METABOLIC PANEL
ANION GAP: 12 (ref 5–15)
BUN: 40 mg/dL — ABNORMAL HIGH (ref 6–20)
CHLORIDE: 91 mmol/L — AB (ref 101–111)
CO2: 25 mmol/L (ref 22–32)
CREATININE: 2.04 mg/dL — AB (ref 0.61–1.24)
Calcium: 8 mg/dL — ABNORMAL LOW (ref 8.9–10.3)
GFR calc non Af Amer: 31 mL/min — ABNORMAL LOW (ref >60)
GFR, EST AFRICAN AMERICAN: 36 mL/min — AB (ref >60)
Glucose, Bld: 45 mg/dL — ABNORMAL LOW (ref 65–99)
Potassium: 5.2 mmol/L — ABNORMAL HIGH (ref 3.5–5.1)
SODIUM: 128 mmol/L — AB (ref 135–145)

## 2017-08-10 LAB — GLUCOSE, CAPILLARY
GLUCOSE-CAPILLARY: 13 mg/dL — AB (ref 65–99)
Glucose-Capillary: <10 mg/dL — CL (ref 65–99)
Glucose-Capillary: 83 mg/dL (ref 65–99)

## 2017-08-10 LAB — CBC
HEMATOCRIT: 35 % — AB (ref 39.0–52.0)
Hemoglobin: 11.4 g/dL — ABNORMAL LOW (ref 13.0–17.0)
MCH: 28.1 pg (ref 26.0–34.0)
MCHC: 32.6 g/dL (ref 30.0–36.0)
MCV: 86.2 fL (ref 78.0–100.0)
PLATELETS: 341 10*3/uL (ref 150–400)
RBC: 4.06 MIL/uL — AB (ref 4.22–5.81)
RDW: 17.3 % — AB (ref 11.5–15.5)
WBC: 18.3 10*3/uL — AB (ref 4.0–10.5)

## 2017-08-10 LAB — MAGNESIUM: MAGNESIUM: 2 mg/dL (ref 1.7–2.4)

## 2017-08-10 MED ORDER — TAMSULOSIN HCL 0.4 MG PO CAPS: 0.4000 mg | ORAL_CAPSULE | Freq: Every day | ORAL | Status: DC

## 2017-08-10 MED ORDER — DEXTROSE 50 % IV SOLN
INTRAVENOUS | Status: AC
  Administered 2017-08-10: 25 mL
  Filled 2017-08-10: qty 50

## 2017-08-10 MED ORDER — FUROSEMIDE 10 MG/ML IJ SOLN: 80.0000 mg | Freq: Once | INTRAMUSCULAR | Status: DC

## 2017-08-10 MED ORDER — HYDROCORTISONE NA SUCCINATE PF 100 MG IJ SOLR: 50.0000 mg | Freq: Three times a day (TID) | INTRAMUSCULAR | Status: DC

## 2017-08-11 ENCOUNTER — Telehealth: Payer: Self-pay

## 2017-08-11 LAB — T4: T4, Total: 6.5 ug/dL (ref 4.5–12.0)

## 2017-08-11 NOTE — Discharge Summary (Signed)
NAME:  KIEFER, OPHEIM NO.:  [id number]  MEDICAL RECORD NO.:  [id number]  LOCATION:  [id number]                        FACILITY:  [facility]  PHYSICIAN:  Providence Lanius, MD  DATE OF BIRTH:  1945-11-20  DATE OF ADMISSION:  06/10/2017 DATE OF DISCHARGE:                              DISCHARGE SUMMARY   PRIMARY DIAGNOSIS/CAUSE OF DEATH:  Interstitial lung disease.  SECONDARY DIAGNOSES: 1. Acute respiratory failure with hypoxemia. 2. Right-sided heart failure. 3. Severe pulmonary hypertension. 4. Junctional bradycardia secondary to digoxin toxicity. 5. Diastolic heart failure. 6. Atrial fibrillation. 7. Acute on chronic renal failure. 8. Hyperkalemia in the setting of acute kidney injury. 9. Hyponatremia. 56.OZHYQ gap metabolic acidosis. 11.Lactic acidosis. 12.Diarrhea. 13.Acalculous cholecystitis. 14.Leukocytosis. 15.Anemia of chronic disease. 16.Steroid dependence.  HOSPITAL COURSE:  The patient is a 71 year old male with past medical history significant for severe interstitial lung disease who has been chronically been debilitated on home oxygen, who presented to the hospital with symptomatic bradycardia in the setting of digoxin toxicity.  The patient was admitted to the intensive care unit.  The patient admitted to the intensive care unit and treated accordingly. __________ the patient expired, he removed his oxygen which he has done the night before, became profoundly hypoxemic with saturation down in the 50s.  We attempted to place the oxygen back on, but the patient per his request, was do not resuscitate.  We do not attempt intubation.  With high-flow nasal cannula back on, the patient's saturation did not improve and eventually suffered a cardiac arrest and expired.  The family was notified.     Providence Lanius, MD     WJY/MEDQ  D:  08/11/2017  T:  08/11/2017  Job:  [id number]  cc:   Patient Chart

## 2017-08-11 NOTE — Telephone Encounter (Signed)
On 08/11/17 I received a d/c from [facility] (original). The d/c is for cremation. The patient is a patient of Doctor Nelda Marseille. The d/c will be taken to [facility] for signature.  On 08/13/17 I received the d/c back from Doctor Halford Chessman who signed the d/c for [facility]. I got the d/c ready and called the funeral home to let them know the d/c is ready to be picked up. I also faxed a copy to the funeral home per the funeral home request.

## 2017-08-13 LAB — CULTURE, BLOOD (ROUTINE X 2)
CULTURE: NO GROWTH
SPECIAL REQUESTS: ADEQUATE

## 2017-08-14 LAB — CULTURE, BLOOD (ROUTINE X 2)
Culture: NO GROWTH
Special Requests: ADEQUATE

## 2017-08-19 NOTE — Progress Notes (Addendum)
CRITICAL VALUE ALERT  Critical Value:  CBG <10, CBG 13 on 2nd stick  Date & Time Notied:  2017-08-26 at 0815  Pt given 2 glasses of OJ. Rechecked CBG at 0830 results <10. Administered 25 of Glucose. Will recheck CBG at 0845. Rn will continue to monitor.   Recheck CBG 83.

## 2017-08-19 NOTE — Discharge Summary (Signed)
NAME:  Gilbert Dominguez, Gilbert Dominguez                ACCOUNT NO.:  MEDICAL RECORD NO.:  [id number]  LOCATION:                                 FACILITY:  PHYSICIAN:  Providence Lanius, MD  DATE OF BIRTH:  Apr 16, 1946  DATE OF ADMISSION: DATE OF DISCHARGE:                              DISCHARGE SUMMARY   PRIMARY DIAGNOSIS/CAUSE OF DEATH:  Acute hypoxemic respiratory failure.  SECONDARY DIAGNOSES: 1. Atrial fibrillation with rapid ventricular response. 2. Pulmonary hypertension. 3. Interstitial lung disease. 4. Diastolic heart failure with severely dilated RV. 5. Acute on chronic renal failure. 6. Hyperkalemia. 7. Hyponatremia. 8. Anion gap metabolic acidosis. 9. Diarrhea. 10.Acalculous cholecystitis. 11.Anemia of chronic disease. 12.Steroid dependence. 13.Syncope.  HOSPITAL COURSE:  The patient is a 71 year old male with past medical history significant for interstitial lung disease presented to the hospital with acute renal failure and acute on chronic respiratory failure with severe hypoxemia and pulmonary hypertension.  On admission, the patient said that he would not want to go on life support.  Made himself DNR.  On the day the patient expired, I examined the __________ that he is ready to die and I went with our conversation __________ condition stabilizing.  However, approximately 20 minutes later, the patient removed his oxygen and became profoundly hypoxemic.  We placed oxygen back on   DICTATION ENDS HERE     Providence Lanius, MD     WJY/MEDQ  D:  08/11/2017  T:  08/11/2017  Job:  [id number]

## 2017-08-19 NOTE — Progress Notes (Addendum)
Progress Note  Patient Name: Gilbert Dominguez Date of Encounter: 08-29-17  Primary Cardiologist: Hilty/Klein  Subjective   Exhausted this AM - had transient severe hypoglycemia and severe hypoxemia when he briefly removed his O2 cannula. Hemodynamically better. Switched from junctional to sinus rhythm around 0200h and has maintained since. Occ PACs and PVCs, no atrial fibrillation. K mildly elevated still, creat marginally worse. Fair UO, about 1L last 24h. Worsening hyponatremia  Inpatient Medications    Scheduled Meds: . furosemide  80 mg Intravenous Once  . heparin  5,000 Units Subcutaneous Q8H  . insulin aspart  2-6 Units Subcutaneous TID WC  . ipratropium-albuterol  3 mL Nebulization Q6H  . predniSONE  20 mg Oral Q breakfast  . tamsulosin  0.4 mg Oral QPC supper   Continuous Infusions: . sodium chloride 250 mL (08/09/17 0800)  . ceFEPime (MAXIPIME) IV Stopped (08-29-2017 0809)  . DOPamine Stopped (08-29-17 0130)  . magnesium sulfate 1 - 4 g bolus IVPB     PRN Meds: sodium chloride, morphine injection, ondansetron (ZOFRAN) IV   Vital Signs    Vitals:   August 29, 2017 0800 2017/08/29 0815 2017-08-29 0830 08-29-17 0850  BP: 100/65     Pulse:   84 89  Resp: 18  19 (!) 23  Temp:      TempSrc:      SpO2:  92% 99% 93%  Weight:      Height:        Intake/Output Summary (Last 24 hours) at 08/29/2017 0908 Last data filed at August 29, 2017 0809  Gross per 24 hour  Intake           1314.5 ml  Output              950 ml  Net            364.5 ml   Filed Weights   07/31/2017 2209 08/09/17 0300 Aug 29, 2017 0346  Weight: 105 lb (47.6 kg) 115 lb 4.8 oz (52.3 kg) 114 lb 10.2 oz (52 kg)    Telemetry    Junctional to sinus, occ isolated PACs and PVCs - Personally Reviewed  ECG    Yesterday - junctional rhythm - Personally Reviewed  Physical Exam  A little sleepy (after a low dose of morphine), mild respiratory distress GEN: cachestic  Neck: 7-8 cm JVD Cardiac: very distant heart  sounds, RRR, no murmurs, rubs, or gallops.  Respiratory: dry crackles in bases on auscultation bilaterally. GI: Soft, nontender, non-distended  MS: No edema; No deformity. Neuro:  Nonfocal, a little drowsy  Psych: Normal affect   Labs    Chemistry Recent Labs Lab 08/09/2017 2246  08/09/17 0310 08/09/17 0955 08-29-2017 0246  NA 129*  < > 131* 133* 128*  K 5.8*  < > 5.2* 5.7* 5.2*  CL 95*  < > 95* 96* 91*  CO2 16*  --  18* 22 25  GLUCOSE 204*  < > 164* 179* 45*  BUN 35*  < > 35* 37* 40*  CREATININE 1.83*  < > 1.85* 1.87* 2.04*  CALCIUM 8.4*  --  8.5* 8.5* 8.0*  PROT 6.4*  --   --   --   --   ALBUMIN 3.2*  --   --   --   --   AST 56*  --   --   --   --   ALT 22  --   --   --   --   ALKPHOS 75  --   --   --   --  BILITOT 0.9  --   --   --   --   GFRNONAA 35*  --  34* 34* 31*  GFRAA 41*  --  40* 39* 36*  ANIONGAP 18*  --  18* 15 12  < > = values in this interval not displayed.   Hematology Recent Labs Lab 07/29/2017 2246 07/31/2017 2302 08/09/17 0310 13-Aug-2017 0246  WBC 15.8*  --  22.6* 18.3*  RBC 3.87*  --  3.55* 4.06*  HGB 10.7* 11.2* 10.0* 11.4*  HCT 34.6* 33.0* 31.4* 35.0*  MCV 89.4  --  88.5 86.2  MCH 27.6  --  28.2 28.1  MCHC 30.9  --  31.8 32.6  RDW 17.6*  --  17.5* 17.3*  PLT 297  --  283 341    Cardiac EnzymesNo results for input(s): TROPONINI in the last 168 hours.  Recent Labs Lab 07/25/2017 2300 08/09/17 0231  TROPIPOC 0.11* 0.05     BNP Recent Labs Lab 08/06/17 0952 08/04/2017 2251  BNP  --  1,351.2*  PROBNP 1,885.0*  --      DDimer No results for input(s): DDIMER in the last 168 hours.   Radiology    Ct Abdomen Pelvis Wo Contrast  Result Date: 08/09/2017 CLINICAL DATA:  Acute onset of shortness of breath and dizziness. Slipped to ground. Concern for chest or abdominal injury. EXAM: CT CHEST, ABDOMEN AND PELVIS WITHOUT CONTRAST TECHNIQUE: Multidetector CT imaging of the chest, abdomen and pelvis was performed following the standard protocol  without IV contrast. COMPARISON:  Renal ultrasound performed 06/11/2017, and CTA of the chest performed 04/29/2017 FINDINGS: CT CHEST FINDINGS Cardiovascular: The heart is enlarged. Calcification is noted at the aortic and mitral valves. Scattered calcification is noted along the aortic arch and proximal great vessels. There is no evidence of aortic injury, though evaluation for aortic injury is limited without contrast. Mediastinum/Nodes: A prominent 1.7 cm right paratracheal node is seen. No additional mediastinal lymphadenopathy is appreciated. No pericardial effusion is identified. The thyroid gland is grossly unremarkable. No axillary lymphadenopathy is appreciated. Lungs/Pleura: Bilateral peripheral honeycombing is noted, with prominent areas of scarring. Emphysematous change is seen bilaterally. No definite superimposed focal airspace consolidation is appreciated. Evaluation for mass is limited due to areas of scarring. Musculoskeletal: No acute osseous abnormalities are identified. The visualized musculature is unremarkable in appearance. CT ABDOMEN PELVIS FINDINGS Hepatobiliary: The liver is grossly unremarkable in appearance. Trace ascites is noted about the liver. There is wall thickening at the gallbladder, with surrounding soft tissue inflammation, raising question for cholecystitis. Vague soft tissue inflammation tracks about the porta hepatis. The common bile duct is not well characterized. Pancreas: The pancreas is within normal limits. Spleen: The spleen is unremarkable in appearance. Trace ascites is noted about the spleen. Adrenals/Urinary Tract: The adrenal glands are grossly unremarkable in appearance Fluid is seen tracking inferiorly along Gerota's fascia bilaterally. Mild perinephric stranding is noted bilaterally. There is no evidence of hydronephrosis. No renal or ureteral stones are identified. Stomach/Bowel: The stomach is unremarkable in appearance. Vague soft tissue inflammation is noted  tracking along the second and third segments of the duodenum, with associated wall thickening. This inflammation tracks inferiorly along the mesentery. This may reflect an underlying duodenal ulcer or duodenitis. The appendix is normal in caliber, without evidence of appendicitis. Scattered diverticulosis is noted along the descending colon, without evidence of diverticulitis. Vascular/Lymphatic: Scattered calcification is seen along the abdominal aorta and its branches. The abdominal aorta is otherwise grossly unremarkable. The inferior vena cava  is grossly unremarkable. No retroperitoneal lymphadenopathy is seen. No pelvic sidewall lymphadenopathy is identified. Reproductive: The bladder is mildly distended and grossly unremarkable. The prostate remains normal in size, with mild calcification. Other: No additional soft tissue abnormalities are seen. Musculoskeletal: No acute osseous abnormalities are identified. A chronic right-sided pars defect is noted at L5, and a lamina defect is noted on the left side at L5. The visualized musculature is unremarkable in appearance. IMPRESSION: 1. Vague soft tissue inflammation tracking along the second and third segments of the duodenum, with associated wall thickening. Inflammation tracks inferiorly along the mesentery. This may reflect an underlying duodenal ulcer or duodenitis. Would correlate with the patient's symptoms, and consider endoscopy for further evaluation. 2. Associated wall thickening at the gallbladder, with surrounding soft tissue inflammation, raising question for cholecystitis. Vague soft tissue inflammation tracks about the porta hepatis. 3. Associated fluid tracks inferiorly along Gerota's fascia bilaterally. Trace ascites noted about the upper abdomen. 4. Bilateral peripheral honeycombing, with prominent areas of scarring in both lungs. Bilateral emphysema. No definite superimposed focal airspace consolidation seen. 5. Scattered diverticulosis along the  descending colon, without evidence of diverticulitis. 6. Prominent 1.7 cm right paratracheal node. 7. Cardiomegaly.  Calcification at the aortic and mitral valves. 8. Scattered aortic atherosclerosis. Electronically Signed   By: Garald Balding M.D.   On: 08/09/2017 03:12   Ct Chest Wo Contrast  Result Date: 08/09/2017 CLINICAL DATA:  Acute onset of shortness of breath and dizziness. Slipped to ground. Concern for chest or abdominal injury. EXAM: CT CHEST, ABDOMEN AND PELVIS WITHOUT CONTRAST TECHNIQUE: Multidetector CT imaging of the chest, abdomen and pelvis was performed following the standard protocol without IV contrast. COMPARISON:  Renal ultrasound performed 06/11/2017, and CTA of the chest performed 04/29/2017 FINDINGS: CT CHEST FINDINGS Cardiovascular: The heart is enlarged. Calcification is noted at the aortic and mitral valves. Scattered calcification is noted along the aortic arch and proximal great vessels. There is no evidence of aortic injury, though evaluation for aortic injury is limited without contrast. Mediastinum/Nodes: A prominent 1.7 cm right paratracheal node is seen. No additional mediastinal lymphadenopathy is appreciated. No pericardial effusion is identified. The thyroid gland is grossly unremarkable. No axillary lymphadenopathy is appreciated. Lungs/Pleura: Bilateral peripheral honeycombing is noted, with prominent areas of scarring. Emphysematous change is seen bilaterally. No definite superimposed focal airspace consolidation is appreciated. Evaluation for mass is limited due to areas of scarring. Musculoskeletal: No acute osseous abnormalities are identified. The visualized musculature is unremarkable in appearance. CT ABDOMEN PELVIS FINDINGS Hepatobiliary: The liver is grossly unremarkable in appearance. Trace ascites is noted about the liver. There is wall thickening at the gallbladder, with surrounding soft tissue inflammation, raising question for cholecystitis. Vague soft  tissue inflammation tracks about the porta hepatis. The common bile duct is not well characterized. Pancreas: The pancreas is within normal limits. Spleen: The spleen is unremarkable in appearance. Trace ascites is noted about the spleen. Adrenals/Urinary Tract: The adrenal glands are grossly unremarkable in appearance Fluid is seen tracking inferiorly along Gerota's fascia bilaterally. Mild perinephric stranding is noted bilaterally. There is no evidence of hydronephrosis. No renal or ureteral stones are identified. Stomach/Bowel: The stomach is unremarkable in appearance. Vague soft tissue inflammation is noted tracking along the second and third segments of the duodenum, with associated wall thickening. This inflammation tracks inferiorly along the mesentery. This may reflect an underlying duodenal ulcer or duodenitis. The appendix is normal in caliber, without evidence of appendicitis. Scattered diverticulosis is noted along the descending colon,  without evidence of diverticulitis. Vascular/Lymphatic: Scattered calcification is seen along the abdominal aorta and its branches. The abdominal aorta is otherwise grossly unremarkable. The inferior vena cava is grossly unremarkable. No retroperitoneal lymphadenopathy is seen. No pelvic sidewall lymphadenopathy is identified. Reproductive: The bladder is mildly distended and grossly unremarkable. The prostate remains normal in size, with mild calcification. Other: No additional soft tissue abnormalities are seen. Musculoskeletal: No acute osseous abnormalities are identified. A chronic right-sided pars defect is noted at L5, and a lamina defect is noted on the left side at L5. The visualized musculature is unremarkable in appearance. IMPRESSION: 1. Vague soft tissue inflammation tracking along the second and third segments of the duodenum, with associated wall thickening. Inflammation tracks inferiorly along the mesentery. This may reflect an underlying duodenal ulcer  or duodenitis. Would correlate with the patient's symptoms, and consider endoscopy for further evaluation. 2. Associated wall thickening at the gallbladder, with surrounding soft tissue inflammation, raising question for cholecystitis. Vague soft tissue inflammation tracks about the porta hepatis. 3. Associated fluid tracks inferiorly along Gerota's fascia bilaterally. Trace ascites noted about the upper abdomen. 4. Bilateral peripheral honeycombing, with prominent areas of scarring in both lungs. Bilateral emphysema. No definite superimposed focal airspace consolidation seen. 5. Scattered diverticulosis along the descending colon, without evidence of diverticulitis. 6. Prominent 1.7 cm right paratracheal node. 7. Cardiomegaly.  Calcification at the aortic and mitral valves. 8. Scattered aortic atherosclerosis. Electronically Signed   By: Garald Balding M.D.   On: 08/09/2017 03:12   US Abdomen Complete  Result Date: 08/09/2017 CLINICAL DATA:  Abdominal pain EXAM: ABDOMEN ULTRASOUND COMPLETE COMPARISON:  CT from earlier in the same day FINDINGS: Gallbladder: Gallbladder wall thickening to 4 cm is noted with wall edema and mild pericholecystic fluid. No definitive gallstones are seen. These changes would be consistent with a calculus cholecystitis. Given the degree of inflammatory change surrounding the duodenum however this may be compensatory in nature. Clinical correlation is recommended. Common bile duct: Diameter: 4 mm Liver: No focal lesion identified. Within normal limits in parenchymal echogenicity. Portal vein is patent on color Doppler imaging with normal direction of blood flow towards the liver. IVC: No abnormality visualized. Pancreas: Visualized portion unremarkable. Spleen: Size and appearance within normal limits. Right Kidney: Length: 9.7 cm. Echogenic foci are noted without posterior acoustical shadowing. These may represent small angiomyolipomas. Left Kidney: Length: 12 cm. Echogenic foci are  noted without acoustical shadowing. No stones are noted on the prior exam in these may represent small angiomyolipomas. Abdominal aorta: No aneurysm visualized. Other findings: Mild ascites IMPRESSION: Gallbladder wall thickening and pericholecystic fluid. No gallstones are identified in these changes may represent acalculous cholecystitis. They may also be in part related to the inflammatory changes surrounding the duodenum. HIDA scan may be helpful for further evaluation to assess for gallbladder function. Echogenic foci within the kidneys bilaterally. No stones are noted on recent CT. These may represent small angiomyolipomas. Electronically Signed   By: Inez Catalina M.D.   On: 08/09/2017 10:13   Dg Chest Portable 1 View  Result Date: 07/24/2017 CLINICAL DATA:  Acute onset of shortness of breath and dizziness. Briefly pulseless. Initial encounter. EXAM: PORTABLE CHEST 1 VIEW COMPARISON:  Chest radiograph performed 07/07/2017 FINDINGS: The lungs are well-aerated. Chronic bibasilar airspace opacities likely reflect the patient's chronic interstitial lung disease. No definite superimposed airspace consolidation is seen. There is no evidence of pleural effusion or pneumothorax. The cardiomediastinal silhouette is mildly enlarged. External pacing pads are noted. No acute osseous  abnormalities are seen. IMPRESSION: 1. Chronic bibasilar airspace opacities likely reflect the patient's chronic interstitial lung disease. No definite superimposed airspace consolidation seen. 2. Mild cardiomegaly. Electronically Signed   By: Garald Balding M.D.   On: 08/04/2017 23:41    Cardiac Studies   ECHO 08/09/2017  - Left ventricle: The cavity size was normal. Wall thickness was   increased in a pattern of mild LVH. The estimated ejection   fraction was 55%. Wall motion was normal; there were no regional   wall motion abnormalities. Features are consistent with a   pseudonormal left ventricular filling pattern, with  concomitant   abnormal relaxation and increased filling pressure (grade 2   diastolic dysfunction). - Ventricular septum: D-shaped interventricular septum suggestive   of RV pressure/volume overload. - Aortic valve: Trileaflet; moderately calcified leaflets. There   was no stenosis. - Mitral valve: Mildly to moderately calcified annulus. There was   trivial regurgitation. - Left atrium: The atrium was moderately dilated. - Right ventricle: The cavity size was severely dilated. Systolic   function was moderately reduced. - Right atrium: The atrium was severely dilated. - Tricuspid valve: There was moderate regurgitation. Peak RV-RA   gradient (S): 48 mm Hg. - Systemic veins: IVC not visualized.  Impressions:  - Normal LV size with mild LV hypertrophy. EF 55%. Severely dilated   RV with moderately decreased systolic function. D-shaped   interventricular septum suggesting RV pressure/volume overload.   Biatrial enlargment. Moderate pulmonary hypertension.   Patient Profile     71 y.o. 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 Improved after digibind.  Assessment & Plan    1. Dig toxicity: appears to have mostly resolved. He remains hypotensive, but not in overt shock. 2. 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 I do not think CHF is currently contributing to his dyspnea/hypoxemia. Note that target outpatient "dry weight" was 117 lb per Dr. Gustavus Bryant note (and he is below that today). 3. Acute renal failure: probably pre-renal to begin with, but may have some degree of ATN after severe and protracted reduction in cardiac output. 4. Hyponatremia/hypokalemia: residual dig effect, but I think he may still need to regain some volume. Probably also related to iatrogenic adrenal insufficiency due  to chronic steroid use.  5. Aflutter: fortunately, no recurrence yet. 6. Chronic (hypoxic) respiratory failure: postinflammatory pulmonary fibrosis (RA), steroid dependent for years. 7. Adrenal insufficiency (iatrogenic): the constellation of hypotension, hypoglycemia, hyponatremia and hypokalemia fits well with this diagnosis. Will give stress doses of hydrocortisone for 24h and reevaluate. (prednisone stopped for this period).  He told me today that he is dying. He has chosen DNR status. I believe palliative care approach is best for him at this point.  He is critically ill w multisystem organ failureand requires analysis of data from multiple sources, complex decision making and multidisciplinary consultation. 55 minutes spent in direct patient care.  For questions or updates, please contact [facility] Please consult [email or web address] for contact info under Cardiology/STEMI.      Signed, Sanda Klein, MD  2017/08/16, 9:08 AM

## 2017-08-19 NOTE — Progress Notes (Signed)
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 Patient was trying to get into his truck but felt weak and fell by the side and scraped his legs against the car. Denies LOC. Son called EMS because patient was not able to get back up. Denies chest pain or shortness of breath during this time. Found to be bradycardic by EMS with HR of 10?Marland Kitchen In ED patient was bradycardic on tele and had one episode of possible PEA with HR of 10 which lasted few seconds. Pacer pads placed and cardio consulted.   On further questioning patient states he had decreased PO intake for the past few days. Has chronic cough that is non productive. Denies any recent fever, travel, sick contact. Has occasional nausea but denies vomiting. + soft/ loose stool for the past 3 days. Denies melena, dysuria, abdominal pain.   Digoxin level elevtaed on workup in the ER and K of 5.8. Given digibind along with calcium gluconate, insulin d50, albuterol. Patient currently lying in bed in NAD and answering all questions approprietly.    Subjective  Feels a little better, however desaturates very briskly requiring as needed morphine VITAL SIGNS: BP 100/65   Pulse 78   Temp 98 F (36.7 C) (Oral)   Resp 18   Ht _0  (1.6 m)   Wt 114 lb 10.2 oz (52 kg)   SpO2 92%   BMI 20.31 kg/m   HEMODYNAMICS:    INTAKE / OUTPUT: I/O last 3 completed shifts: In: 1708.1 [P.O.:600; I.V.:458.1; IV Piggyback:650] Out: 1000 [Urine:1000]  PHYSICAL EXAMINATION: General: Very frail elderly white male, restless, but not in distress with the exception of when he removes his oxygen Neuro: Awake oriented and  cooperative no focal neurological deficits noted  HEENT: Normocephalic atraumatic no JVD  cardiovascular: Loud systolic murmur regular rate and rhythm  lungs: Equal air entry dry posterior crackles some accessory muscle use  abdomen: Soft nontender positive bowel sounds no organomegaly  musculoskeletal: Equal strength and bulk  skin:   Lower extremity pitting edema LABS:  BMET  Recent Labs Lab 08/09/17 0310 08/09/17 0955 2017-08-22 0246  NA 131* 133* 128*  K 5.2* 5.7* 5.2*  CL 95* 96* 91*  CO2 18* 22 25  BUN 35* 37* 40*  CREATININE 1.85* 1.87* 2.04*  GLUCOSE 164* 179* 45*    Electrolytes  Recent Labs Lab 08/09/17 0310 08/09/17 0955 08/22/17 0246  CALCIUM 8.5* 8.5* 8.0*  MG 1.4*  --  2.0  PHOS 4.9*  --   --     CBC  Recent Labs Lab 08/04/2017 2246 08/12/2017 2302 08/09/17 0310 2017-08-22 0246  WBC 15.8*  --  22.6* 18.3*  HGB 10.7* 11.2* 10.0* 11.4*  HCT 34.6* 33.0* 31.4* 35.0*  PLT 297  --  283 341    Coag's  Recent Labs Lab 08/04/2017 2246  INR 1.71    Sepsis Markers  Recent Labs Lab 08/04/2017 2303 08/09/17 0223 08/09/17 0955  LATICACIDVEN 11.58* 9.20* 4.7*    ABG No results for input(s): PHART, PCO2ART, PO2ART in the last 168 hours.  Liver Enzymes  Recent Labs Lab 08/15/2017 2246  AST 56*  ALT 22  ALKPHOS 75  BILITOT 0.9  ALBUMIN 3.2*    Cardiac Enzymes  Recent Labs Lab 08/06/17 0952  PROBNP 1,885.0*    Glucose  Recent Labs Lab 08/09/17 0352 08/09/17 0903 08/09/17 1120 08/09/17 1644  GLUCAP 115* 132* 143* 126*    Imaging US Abdomen Complete  Result Date: 08/09/2017 CLINICAL DATA:  Abdominal pain EXAM: ABDOMEN ULTRASOUND COMPLETE COMPARISON:  CT from earlier in the same day FINDINGS: Gallbladder: Gallbladder wall thickening to 4 cm is noted with wall edema and mild pericholecystic fluid. No definitive gallstones are seen. These changes would be consistent with a calculus cholecystitis. Given the degree of inflammatory  change surrounding the duodenum however this may be compensatory in nature. Clinical correlation is recommended. Common bile duct: Diameter: 4 mm Liver: No focal lesion identified. Within normal limits in parenchymal echogenicity. Portal vein is patent on color Doppler imaging with normal direction of blood flow towards the liver. IVC: No abnormality visualized. Pancreas: Visualized portion unremarkable. Spleen: Size and appearance within normal limits. Right Kidney: Length: 9.7 cm. Echogenic foci are noted without posterior acoustical shadowing. These may represent small angiomyolipomas. Left Kidney: Length: 12 cm. Echogenic foci are noted without acoustical shadowing. No stones are noted on the prior exam in these may represent small angiomyolipomas. Abdominal aorta: No aneurysm visualized. Other findings: Mild ascites IMPRESSION: Gallbladder wall thickening and pericholecystic fluid. No gallstones are identified in these changes may represent acalculous cholecystitis. They may also be in part related to the inflammatory changes surrounding the duodenum. HIDA scan may be helpful for further evaluation to assess for gallbladder function. Echogenic foci within the kidneys bilaterally. No stones are noted on recent CT. These may represent small angiomyolipomas. Electronically Signed   By: Inez Catalina M.D.   On: 08/09/2017 10:13     STUDIES:  ECHO:Mild LVH.  EF estimated 55%.  No regional wall motion abnormalities.  Pseudo-normal left ventricular filling pattern with abnormal relaxation consistent with grade 2 diastolic dysfunction. US Abdomen Complete gallbladder wall thickening with poorly cholecystic fluid no gallstones.  Echogenic foci within the kidneys bilaterally no stones. CULTURES: Blood cultures (10/22):   ANTIBIOTICS: Vancomycin (10/22) discontinued 10/23 Cefepime (10/22)   LINES/TUBES: PIV B/L hand  DISCUSSION: 70 year old male with pulmonary HTN, ILD, on home 02, a-flutter presented  for weakness and found to be bradycardic, hyperkalemic, and also had elevated digoxin level.  Suspect bradycardia multifactorial: Initially mild hypovolemia, followed by acute kidney injury,digoxin toxicity with bradycardia resulting in transient gut ischemia and worsening acidosis.  From a pulmonary standpoint he still remains quite marginal.  His heart rhythm is been more stable.  Assessing his blood chemistries and calculated serum osmolality would favor some degree of volume excess at this point. For today we need to focus on his pulmonary symptoms Lasix x1 We will continue to hold digoxin, in fact discontinue this Continue high flow oxygen We will repeat chest x-ray in a.m. As needed morphine His prognosis remains very poor he is very frail I doubt his renal function will return to baseline  ASSESSMENT / PLAN:  PULMONARY A: Severe Pulmonary HTN (PA pressure 57m Hg) secondary to ILD on 4L [place] Chronic hypoxic respiratory failure CT chest: Peripheral honeycombing, bilateral scarring, bilateral emphysema, no clear focal acute airspace disease His respiratory status has improved, he is now off noninvasive positive pressure ventilation P:   Continue daily prednisone, Lasix x1 Continue high flow oxygen BiPAP as needed Continue DuoNeb Continue pulse oximetry Repeat a.m. chest x-ray  CARDIOVASCULAR A:   Severe symptomatic bradycardia (Junctional bradycardia) secondary to digoxin toxicity and hyperkalemia Digoxin toxicity H/O Diastolic CHF LV EF 91-91% and severely dilated RV Elevated BNP History of atrial fibrillation  had another repeat episode of symptomatic bradycardia early this a.m. Now off dopamine Repeat echocardiogram obtained on 10/22: Mild LVH.  EF estimated 55%.  No regional wall motion abnormalities.  Pseudo-normal left ventricular filling pattern with abnormal relaxation consistent with grade 2 diastolic dysfunction. P:  Continue telemetry monitoring Lasix x1 Digoxin  discontinued Holding diltiazem, and Eliquis  RENAL A:   Acute on chronic renal failure  hyperkalemia in the setting of AKI Hyponatremia currently hypoosmolar, was iso-osmolar initially  anion Gap metabolic acidosis secondary to lactic acidosis, resolved  Serum creatinine continues to climb, potassium remains borderline elevated.  Sodium dropping.  Repeat lactic acid did clear.  He is about 700 L positive thus far.  Suspect that his AK I was initially low cardiac output state, concerned that we may be nearing volume excess at this point however his blood pressure remains borderline P:   KVO IV fluids Repeat chemistry in a.m. Add back Flomax  GASTROINTESTINAL A:   Diarrhea r/o infectious etiology  Possible acalculous cholecystitis by ultrasound, however not supported by laboratory data nor physical exam Rule out ischemic colitis  CT abdomen pelvis: Soft tissue inflammation along the second and third segment of the duodenum. Associated wall thickening. Wall thickening of the gallbladder with soft tissue inflammation soft tissue inflammation tracks about the porta hepatis. Ultrasound abdomen: Gallbladder wall thickening with pericholecystic fluid.  No gallstones identified, question acalculous cholecystitis; however alk phos and bilirubin are normal. P:   Repeat LFTs this a.m. Advance diet as tolerated  HEMATOLOGIC A:   Leukocytosis r/o sepsis  Anemia of chronic disease P:  Trend CBC Follow-up white blood cell count.   INFECTIOUS A:   Severe lactic acidosis r/o infectious source;  No fever, leukocytosis has improved.  Culture data is negative to date, still feel transient gut ischemia likely etiology here P:   Day #2 vancomycin and cefepime, no role for vancomycin here, will discontinue that  ENDOCRINE A:   Steroid dependence P:   Trend glucose Continue prednisone  NEUROLOGIC A:  Syncope secondary to severe bradycardia P:   Continue supportive care   Erick Colace  ACNP-BC [place] Pager # [phone] OR # [phone] if no answer  Attending Note:  71 year old male with extensive pulmonary history presenting with renal failure, hyperkalemia, respiratory failure with hypoxemia and concern for pneumonia.  This AM he removed his O2 and desaturated down to the 50's and was very confused, nearly expired but now on HFNC is able to maintain in the mid 80's.  On exam he has diffuse crackles.  I reviewed CXR myself, infiltrate noted.  Discussed with cardiology MD and PCCM-NP.  Will hold off diureses for today given hypotension.  Continue O2.  Accept renal function at this point.  Monitor electrolytes.  Added stress dose steroids per cards and will hold in the ICU given overnight's events.  The patient is critically ill with multiple organ systems failure and requires high complexity decision making for assessment and support, frequent evaluation and titration of therapies, application of advanced monitoring technologies and extensive interpretation of multiple databases.   Critical Care Time devoted to patient care services described in this note is  35  Minutes. This time reflects time of care of this signee Dr Jennet Maduro. This critical care time  does not reflect procedure time, or teaching time or supervisory time of PA/NP/Med student/Med Resident etc but could involve care discussion time.  Rush Farmer, M.D. [facility] Pulmonary/Critical Care Medicine. Pager: [phone]. After hours pager: [phone].  16-Aug-2017, 8:21 AM

## 2017-08-19 DEATH — deceased

## 2017-09-07 ENCOUNTER — Encounter: Payer: Medicare Other | Admitting: Adult Health

## 2017-09-18 NOTE — Discharge Summary (Signed)
NAME:  Gilbert Dominguez, Gilbert Dominguez                ACCOUNT NO.:  MEDICAL RECORD NO.:  [id number]  LOCATION:                                 FACILITY:  PHYSICIAN:  Providence Lanius, MD  DATE OF BIRTH:  [birth year]  DATE OF ADMISSION: DATE OF DISCHARGE:                              DISCHARGE SUMMARY   PRIMARY DIAGNOSIS/CAUSE OF DEATH:  Acute hypoxemic respiratory failure.  SECONDARY DIAGNOSES: 1. Atrial fibrillation with rapid ventricular response. 2. Pulmonary hypertension. 3. Interstitial lung disease. 4. Diastolic heart failure with severely dilated RV. 5. Acute on chronic renal failure. 6. Hyperkalemia. 7. Hyponatremia. 8. Anion gap metabolic acidosis. 9. Diarrhea. 10.Acalculous cholecystitis. 11.Anemia of chronic disease. 12.Steroid dependence. 13.Syncope.  HOSPITAL COURSE:  The patient is a 71 year old male with past medical history significant for interstitial lung disease presented to the hospital with acute renal failure and acute on chronic respiratory failure with severe hypoxemia and pulmonary hypertension.  On admission, the patient said that he would not want to go on life support.  Made himself DNR.  On the day the patient expired, I examined the patient and that was when he informed me  that he is ready to die and I went with our conversation and stabilized his condition.  However, approximately 20 minutes later, the patient alarm sounded and the patient was found profoundly hypoxemic off his oxygen.  We placed him back on oxygen but was unable to establish an oxygen saturation that is compatible with life, patient remained unresponsive and expired.  Since he had made his wishes clear prior that he did not want aggressive measures no intubation or CPR were initiated.  Family was notified.  Jennet Maduro, M.D.

## 2019-02-04 IMAGING — NM NM PULMONARY VENT & PERF
16 series · 16 of 16 positions shown · non-contrast
Comparison: Radiographs 04/12/2017.  CT 02/25/2017.

CLINICAL DATA: Pulmonary hypertension.  Rheumatoid arthritis.

EXAM:
NUCLEAR MEDICINE VENTILATION - PERFUSION LUNG SCAN
TECHNIQUE: Ventilation images were obtained in multiple projections using
inhaled aerosol Mc-66m DTPA. Perfusion images were obtained in
multiple projections after intravenous injection of Mc-66m MAA.
RADIOPHARMACEUTICALS:  30.0 mCi Zechnetium-EEm DTPA aerosol
inhalation and 4.0 mCi Zechnetium-EEm MAA IV

[Series 1: ant/post vent · 4.14mm/px · 1 of 1 slices shown (1 of 2)]
[im 1/1]
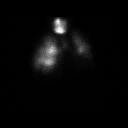

[Series 1: ant/post vent · 4.14mm/px · 1 of 1 slices shown (2 of 2)]
[im 1/1]
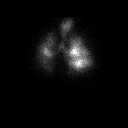

[Series 2: lao/rpo vent · 4.14mm/px · 1 of 1 slices shown (1 of 2)]
[im 1/1]
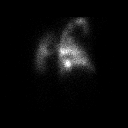

[Series 2: lao/rpo vent · 4.14mm/px · 1 of 1 slices shown (2 of 2)]
[im 1/1]
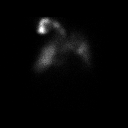

[Series 3: lpo/rao vent · 4.14mm/px · 1 of 1 slices shown (1 of 2)]
[im 1/1]
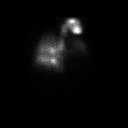

[Series 3: lpo/rao vent · 4.14mm/px · 1 of 1 slices shown (2 of 2)]
[im 1/1]
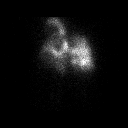

[Series 4: lt lat/rt lat vent · 4.14mm/px · 1 of 1 slices shown (1 of 2)]
[im 1/1]
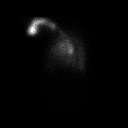

[Series 4: lt lat/rt lat vent · 4.14mm/px · 1 of 1 slices shown (2 of 2)]
[im 1/1]
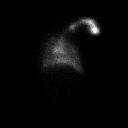

[Series 5: lt lat/rt lat perf · 4.14mm/px · 1 of 1 slices shown (1 of 2)]
[im 1/1]
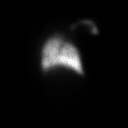

[Series 5: lt lat/rt lat perf · 4.14mm/px · 1 of 1 slices shown (2 of 2)]
[im 1/1]
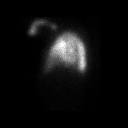

[Series 6: lpo/rao perf · 4.14mm/px · 1 of 1 slices shown (1 of 2)]
[im 1/1]
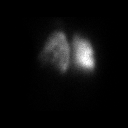

[Series 6: lpo/rao perf · 4.14mm/px · 1 of 1 slices shown (2 of 2)]
[im 1/1]
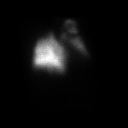

[Series 7: ant/post perf · 4.14mm/px · 1 of 1 slices shown (1 of 2)]
[im 1/1]
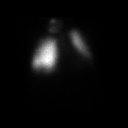

[Series 7: ant/post perf · 4.14mm/px · 1 of 1 slices shown (2 of 2)]
[im 1/1]
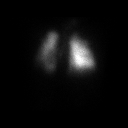

[Series 8: lao/rpo perf · 4.14mm/px · 1 of 1 slices shown (1 of 2)]
[im 1/1]
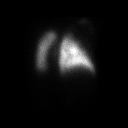

[Series 8: lao/rpo perf · 4.14mm/px · 1 of 1 slices shown (2 of 2)]
[im 1/1]
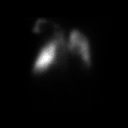

[16 of 16 positions shown; findings below may reference images not displayed]

FINDINGS: Ventilation: Patchy decreased ventilation to both lungs, especially
on the left. There is mild central clumping of the aerosol.

Perfusion: Patchy perfusion to both lungs is similar to the
ventilatory examination. Overall perfusion appears more uniform than
ventilation. No wedge shaped peripheral perfusion defects to suggest
acute pulmonary embolism.
IMPRESSION: Low probability for acute pulmonary embolism. Pattern does not
suggest chronic thromboembolic disease either.

## 2019-02-20 IMAGING — DX DG CHEST 1V PORT
1 series · 1 of 1 positions shown · non-contrast
Comparison: 04/12/2017.  02/24/2017.  11/19/2015.

CLINICAL DATA: Generalized weakness and dyspnea.

EXAM:
PORTABLE CHEST 1 VIEW

[chest ap]
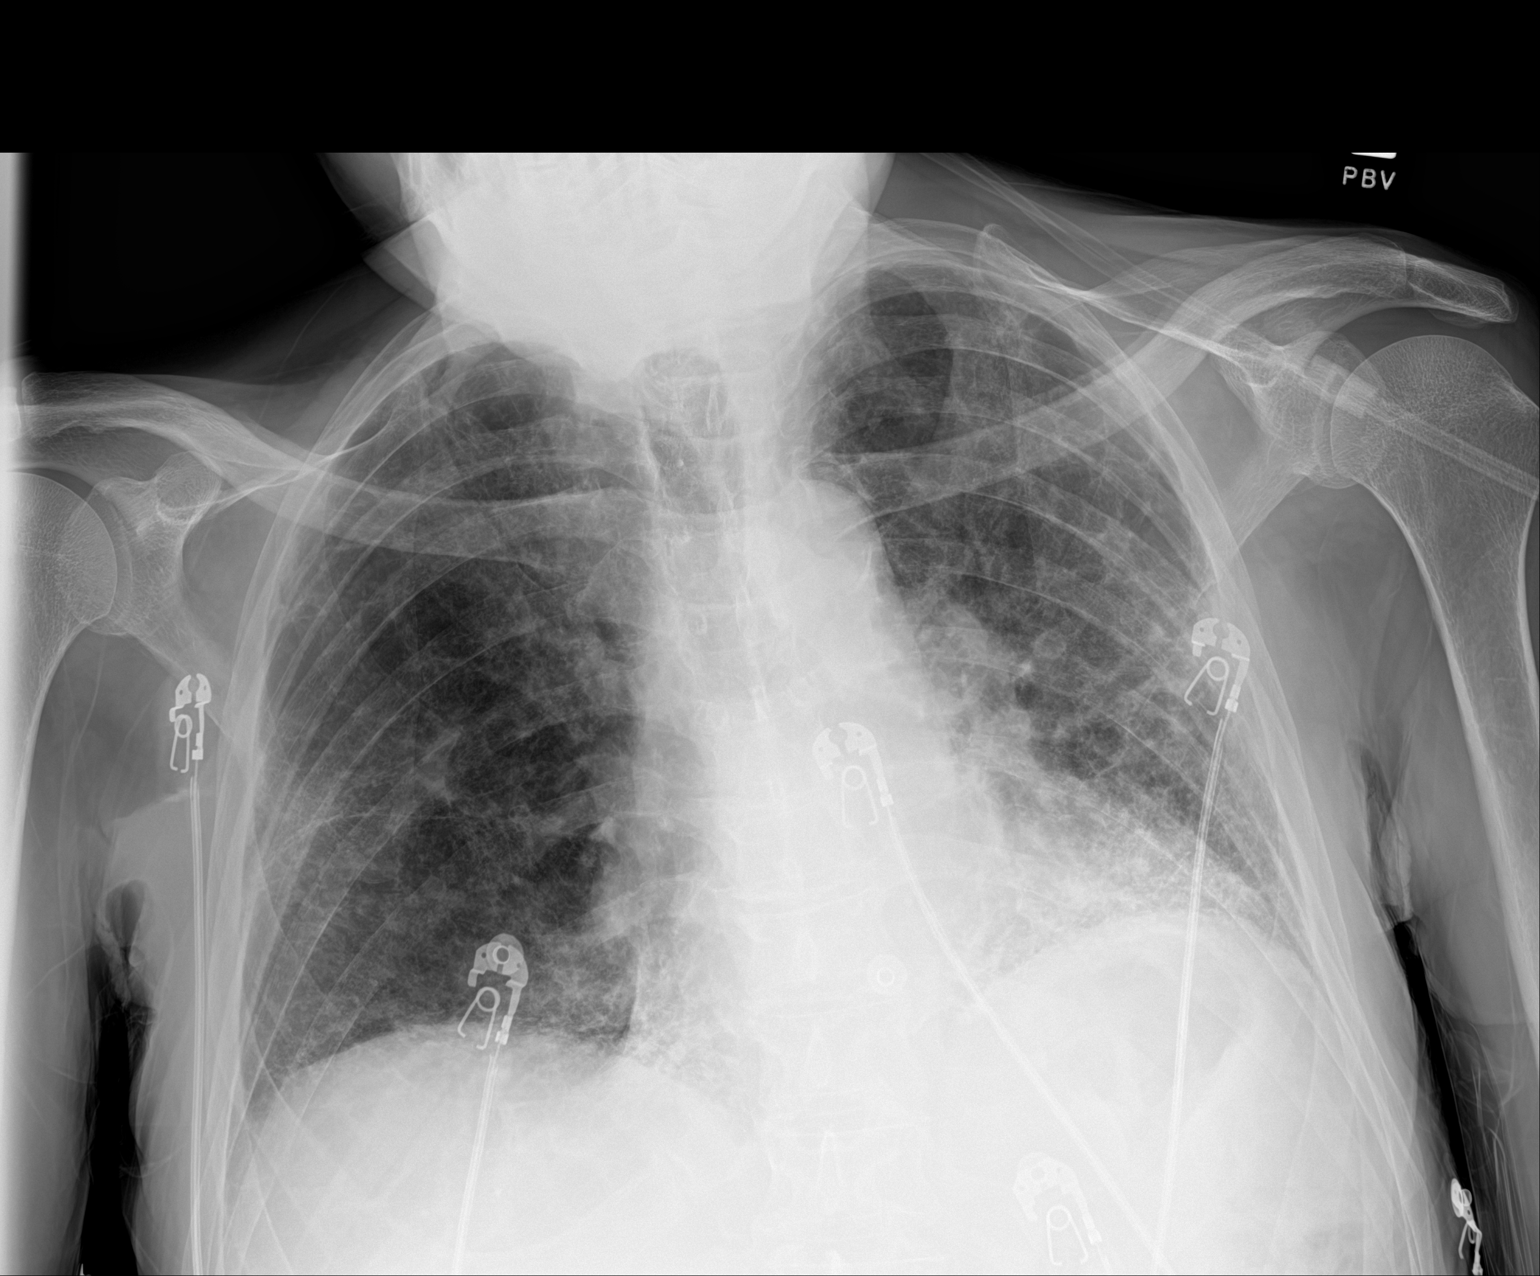

[1 of 1 positions shown; findings below may reference images not displayed]

FINDINGS: 3122 hours. Low lung volumes. Diffuse interstitial and patchy
basilar airspace opacity is similar to prior and compatible with
chronic underlying lung disease given the relative stability. The
cardio pericardial silhouette is enlarged. The visualized bony
structures of the thorax are intact. Telemetry leads overlie the
chest. .
IMPRESSION: 1. Stable appearance of chronic interstitial lung disease.
2. No new or progressive findings.

## 2019-03-03 IMAGING — CR DG CHEST 2V
2 series · 2 of 2 positions shown · non-contrast
Comparison: Radiographs 04/28/2017, CT 04/29/2017

CLINICAL DATA: Edema.  Lower extremity edema.

EXAM:
CHEST  2 VIEW

[chest lat]
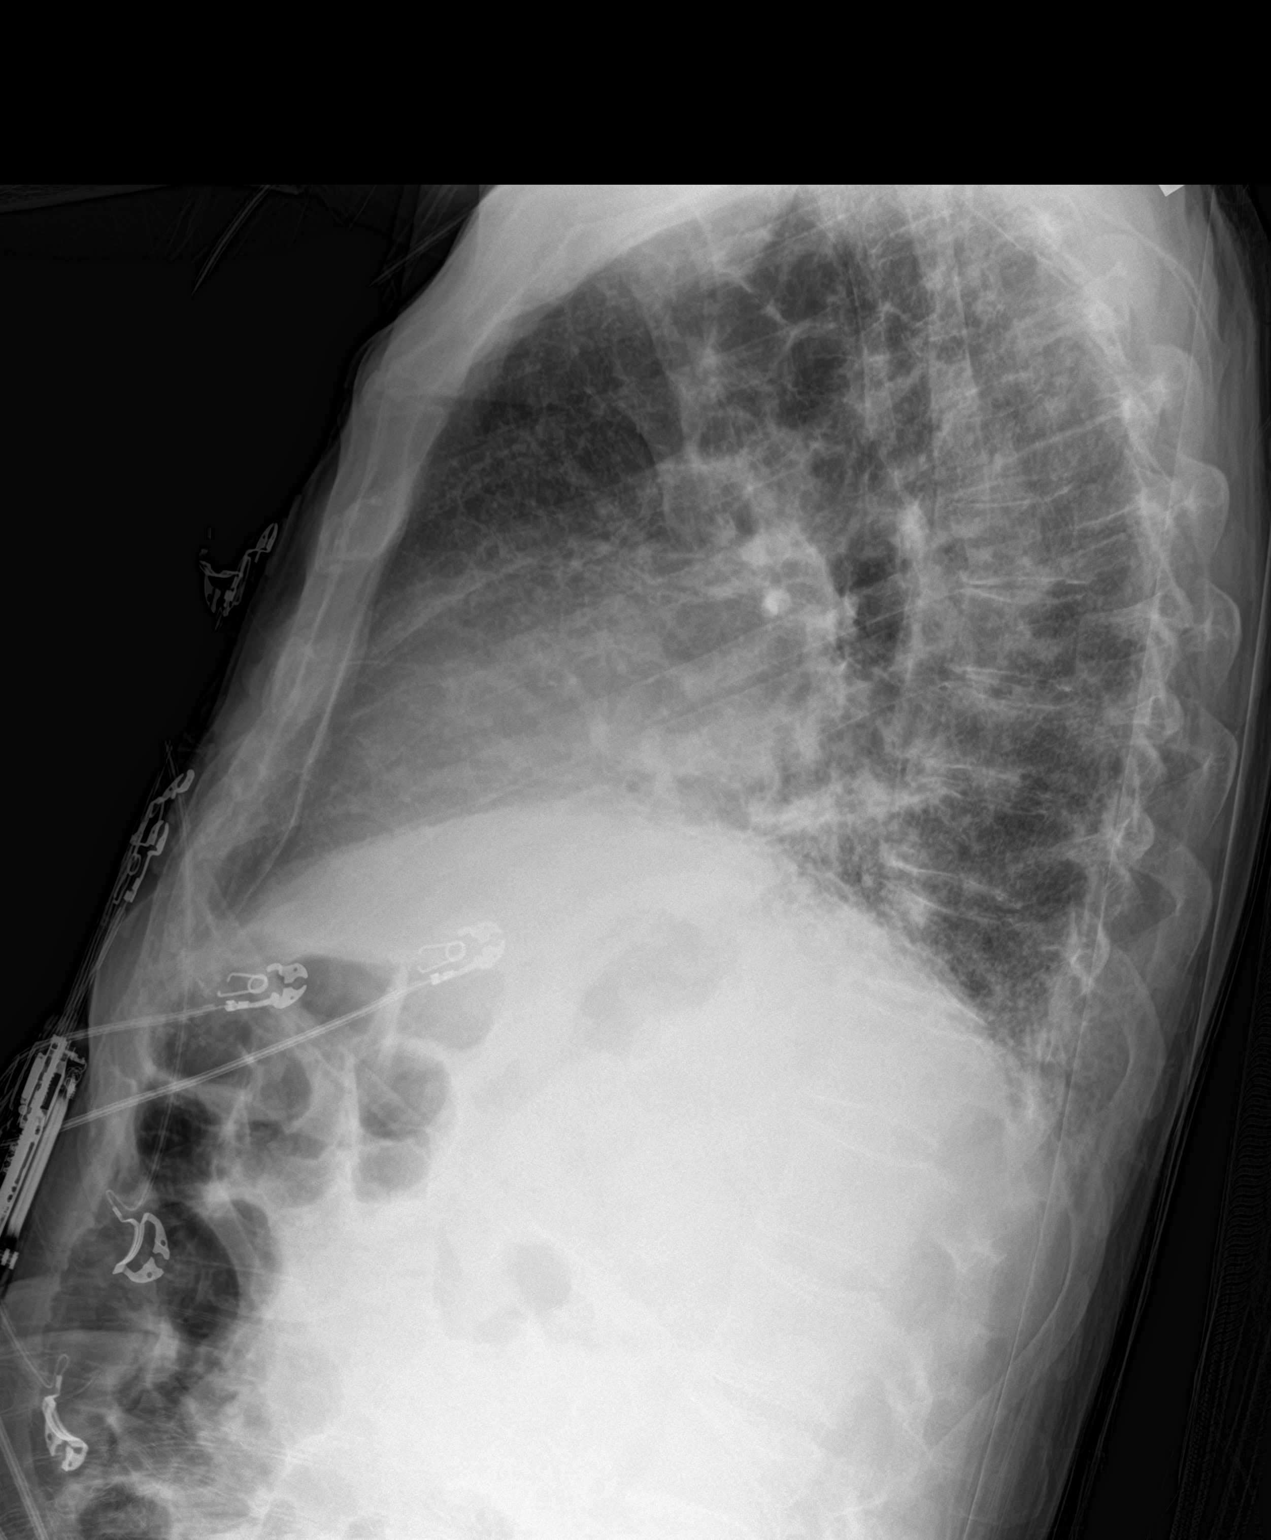

[chest ap]
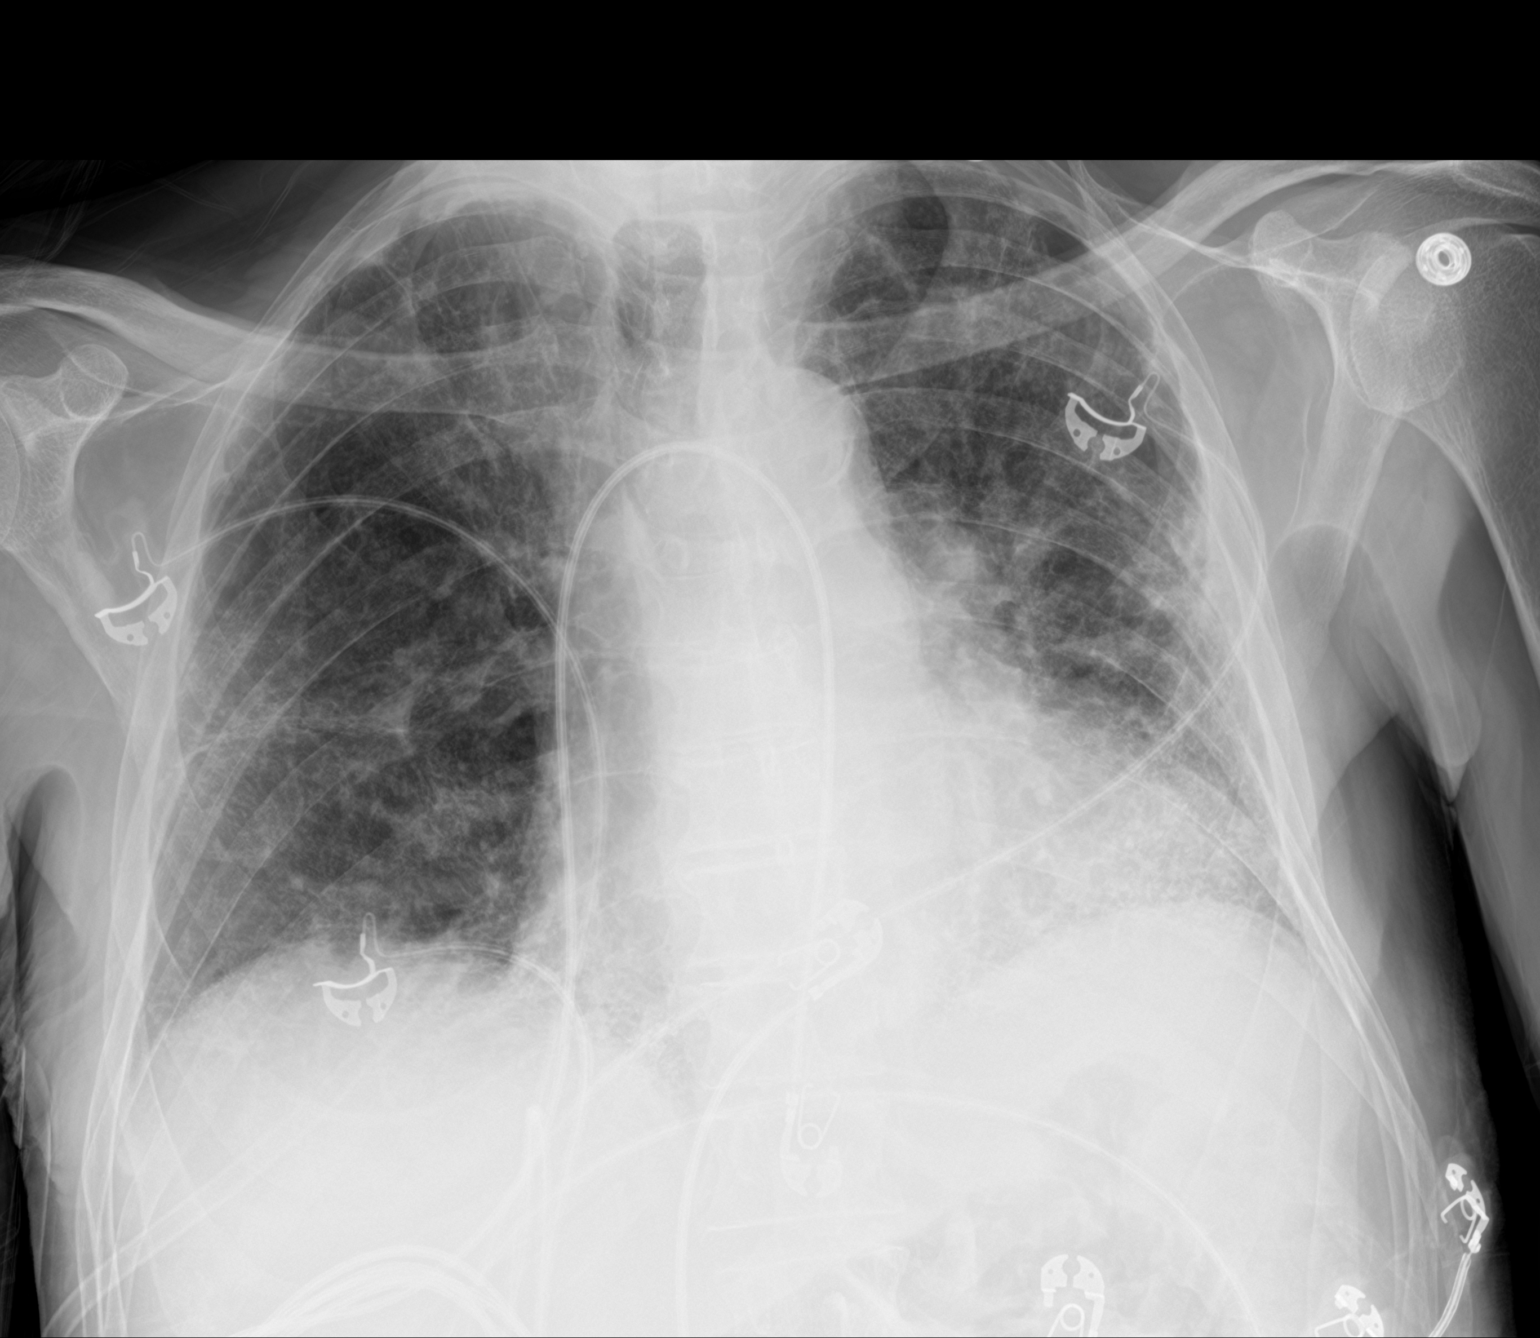

[2 of 2 positions shown; findings below may reference images not displayed]

FINDINGS: Stable cardiomegaly and mediastinal contours. Atherosclerosis of the
thoracic aorta. Basilar prominent subpleural reticulation, with some
left mid lung involvement consistent with chronic interstitial lung
disease. No superimposed consolidation, pleural effusion or
pneumothorax. No acute osseous abnormality.
IMPRESSION: 1. No acute abnormality.
2. Chronic interstitial lung disease, similar to prior exam.
3. Thoracic aortic atherosclerosis.  Stable cardiomegaly.
